# Patient Record
Sex: Female | Born: 1945
Health system: Southern US, Community
[De-identification: ages and names within clinical notes are randomized; demographics above are authoritative.]

## PROBLEM LIST (undated history)

## (undated) DIAGNOSIS — R51 Headache: Secondary | ICD-10-CM

## (undated) DIAGNOSIS — G5603 Carpal tunnel syndrome, bilateral upper limbs: Secondary | ICD-10-CM

## (undated) DIAGNOSIS — R519 Headache, unspecified: Secondary | ICD-10-CM

## (undated) DIAGNOSIS — J449 Chronic obstructive pulmonary disease, unspecified: Secondary | ICD-10-CM

## (undated) DIAGNOSIS — D649 Anemia, unspecified: Secondary | ICD-10-CM

## (undated) DIAGNOSIS — M069 Rheumatoid arthritis, unspecified: Secondary | ICD-10-CM

## (undated) DIAGNOSIS — M1990 Unspecified osteoarthritis, unspecified site: Secondary | ICD-10-CM

## (undated) DIAGNOSIS — E669 Obesity, unspecified: Secondary | ICD-10-CM

## (undated) DIAGNOSIS — I10 Essential (primary) hypertension: Secondary | ICD-10-CM

## (undated) DIAGNOSIS — E039 Hypothyroidism, unspecified: Secondary | ICD-10-CM

## (undated) DIAGNOSIS — M545 Low back pain, unspecified: Secondary | ICD-10-CM

## (undated) DIAGNOSIS — K5790 Diverticulosis of intestine, part unspecified, without perforation or abscess without bleeding: Secondary | ICD-10-CM

## (undated) DIAGNOSIS — K219 Gastro-esophageal reflux disease without esophagitis: Secondary | ICD-10-CM

## (undated) HISTORY — DX: Carpal tunnel syndrome, bilateral upper limbs: G56.03

## (undated) HISTORY — DX: Essential (primary) hypertension: I10

## (undated) HISTORY — DX: Diverticulosis of intestine, part unspecified, without perforation or abscess without bleeding: K57.90

## (undated) HISTORY — DX: Obesity, unspecified: E66.9

## (undated) HISTORY — DX: Rheumatoid arthritis, unspecified: M06.9

## (undated) HISTORY — DX: Low back pain: M54.5

## (undated) HISTORY — PX: OOPHORECTOMY: SHX86

## (undated) HISTORY — DX: Low back pain, unspecified: M54.50

## (undated) HISTORY — PX: OTHER SURGICAL HISTORY: SHX169

---

## 1975-12-11 HISTORY — PX: TUBAL LIGATION: SHX77

## 1978-12-10 HISTORY — PX: HEMORRHOID SURGERY: SHX153

## 2002-10-09 ENCOUNTER — Ambulatory Visit (HOSPITAL_COMMUNITY): Admission: RE | Admit: 2002-10-09 | Discharge: 2002-10-09 | Payer: Self-pay | Admitting: Internal Medicine

## 2002-10-09 ENCOUNTER — Encounter: Payer: Self-pay | Admitting: Internal Medicine

## 2003-04-14 ENCOUNTER — Encounter: Payer: Self-pay | Admitting: Internal Medicine

## 2003-04-14 ENCOUNTER — Ambulatory Visit (HOSPITAL_COMMUNITY): Admission: RE | Admit: 2003-04-14 | Discharge: 2003-04-14 | Payer: Self-pay | Admitting: Internal Medicine

## 2004-01-27 ENCOUNTER — Ambulatory Visit (HOSPITAL_COMMUNITY): Admission: RE | Admit: 2004-01-27 | Discharge: 2004-01-27 | Payer: Self-pay | Admitting: Internal Medicine

## 2007-10-06 ENCOUNTER — Emergency Department (HOSPITAL_COMMUNITY): Admission: EM | Admit: 2007-10-06 | Discharge: 2007-10-06 | Payer: Self-pay | Admitting: Emergency Medicine

## 2008-07-12 ENCOUNTER — Emergency Department (HOSPITAL_COMMUNITY): Admission: EM | Admit: 2008-07-12 | Discharge: 2008-07-13 | Payer: Self-pay | Admitting: Emergency Medicine

## 2008-09-07 ENCOUNTER — Encounter (INDEPENDENT_AMBULATORY_CARE_PROVIDER_SITE_OTHER): Payer: Self-pay | Admitting: Internal Medicine

## 2008-09-07 ENCOUNTER — Ambulatory Visit: Payer: Self-pay | Admitting: Internal Medicine

## 2008-09-07 ENCOUNTER — Other Ambulatory Visit: Admission: RE | Admit: 2008-09-07 | Discharge: 2008-09-07 | Payer: Self-pay | Admitting: Internal Medicine

## 2008-09-07 DIAGNOSIS — M545 Low back pain: Secondary | ICD-10-CM

## 2008-09-07 DIAGNOSIS — G5600 Carpal tunnel syndrome, unspecified upper limb: Secondary | ICD-10-CM

## 2008-09-07 DIAGNOSIS — M1990 Unspecified osteoarthritis, unspecified site: Secondary | ICD-10-CM | POA: Insufficient documentation

## 2008-09-07 DIAGNOSIS — M069 Rheumatoid arthritis, unspecified: Secondary | ICD-10-CM | POA: Insufficient documentation

## 2008-09-07 DIAGNOSIS — R319 Hematuria, unspecified: Secondary | ICD-10-CM

## 2008-09-07 LAB — CONVERTED CEMR LAB
Nitrite: NEGATIVE
Protein, U semiquant: NEGATIVE
Specific Gravity, Urine: 1.015
Urobilinogen, UA: 0.2
WBC Urine, dipstick: NEGATIVE
pH: 6

## 2008-09-09 LAB — CONVERTED CEMR LAB
ALT: 12 units/L (ref 0–35)
AST: 18 units/L (ref 0–37)
Basophils Absolute: 0 10*3/uL (ref 0.0–0.1)
CO2: 21 meq/L (ref 19–32)
CRP: 0.5 mg/dL (ref <0.6)
Calcium: 9.3 mg/dL (ref 8.4–10.5)
Chloride: 108 meq/L (ref 96–112)
Cholesterol: 220 mg/dL — ABNORMAL HIGH (ref 0–200)
Glucose, Bld: 87 mg/dL (ref 70–99)
HCT: 40.6 % (ref 36.0–46.0)
HDL: 74 mg/dL (ref >39)
Hemoglobin: 13.2 g/dL (ref 12.0–15.0)
MCHC: 32.5 g/dL (ref 30.0–36.0)
MCV: 86.6 fL (ref 78.0–100.0)
Neutro Abs: 4.2 10*3/uL (ref 1.7–7.7)
Neutrophils Relative %: 61 % (ref 43–77)
Platelets: 208 10*3/uL (ref 150–400)
Potassium: 3.9 meq/L (ref 3.5–5.3)
RDW: 13.5 % (ref 11.5–15.5)
Rhuematoid fact SerPl-aCnc: 194 intl units/mL — ABNORMAL HIGH (ref 0–20)
Sed Rate: 10 mm/hr (ref 0–22)
Sodium: 142 meq/L (ref 135–145)
Total CHOL/HDL Ratio: 3
Triglycerides: 85 mg/dL (ref <150)

## 2008-09-15 ENCOUNTER — Ambulatory Visit (HOSPITAL_COMMUNITY): Admission: RE | Admit: 2008-09-15 | Discharge: 2008-09-15 | Payer: Self-pay | Admitting: Internal Medicine

## 2008-10-05 ENCOUNTER — Ambulatory Visit: Payer: Self-pay | Admitting: Internal Medicine

## 2008-10-05 DIAGNOSIS — N952 Postmenopausal atrophic vaginitis: Secondary | ICD-10-CM | POA: Insufficient documentation

## 2008-10-08 ENCOUNTER — Encounter (INDEPENDENT_AMBULATORY_CARE_PROVIDER_SITE_OTHER): Payer: Self-pay | Admitting: Internal Medicine

## 2008-10-08 ENCOUNTER — Ambulatory Visit (HOSPITAL_COMMUNITY): Admission: RE | Admit: 2008-10-08 | Discharge: 2008-10-08 | Payer: Self-pay | Admitting: Internal Medicine

## 2008-10-19 ENCOUNTER — Encounter (INDEPENDENT_AMBULATORY_CARE_PROVIDER_SITE_OTHER): Payer: Self-pay | Admitting: Internal Medicine

## 2008-10-25 ENCOUNTER — Ambulatory Visit: Payer: Self-pay | Admitting: Internal Medicine

## 2008-11-17 ENCOUNTER — Ambulatory Visit: Payer: Self-pay | Admitting: Internal Medicine

## 2008-11-17 DIAGNOSIS — M79609 Pain in unspecified limb: Secondary | ICD-10-CM

## 2009-03-07 ENCOUNTER — Encounter (INDEPENDENT_AMBULATORY_CARE_PROVIDER_SITE_OTHER): Payer: Self-pay | Admitting: Internal Medicine

## 2009-12-10 HISTORY — PX: COLONOSCOPY: SHX174

## 2010-01-10 ENCOUNTER — Ambulatory Visit: Payer: Self-pay | Admitting: Family Medicine

## 2010-01-10 DIAGNOSIS — N200 Calculus of kidney: Secondary | ICD-10-CM | POA: Insufficient documentation

## 2010-01-10 DIAGNOSIS — R5383 Other fatigue: Secondary | ICD-10-CM

## 2010-01-10 DIAGNOSIS — M1710 Unilateral primary osteoarthritis, unspecified knee: Secondary | ICD-10-CM

## 2010-01-10 DIAGNOSIS — E559 Vitamin D deficiency, unspecified: Secondary | ICD-10-CM | POA: Insufficient documentation

## 2010-01-10 DIAGNOSIS — R05 Cough: Secondary | ICD-10-CM

## 2010-01-12 ENCOUNTER — Ambulatory Visit (HOSPITAL_COMMUNITY): Admission: RE | Admit: 2010-01-12 | Discharge: 2010-01-12 | Payer: Self-pay | Admitting: Family Medicine

## 2010-01-12 ENCOUNTER — Encounter: Payer: Self-pay | Admitting: Physician Assistant

## 2010-01-12 ENCOUNTER — Telehealth: Payer: Self-pay | Admitting: Family Medicine

## 2010-01-13 LAB — CONVERTED CEMR LAB
BUN: 18 mg/dL (ref 6–23)
CO2: 22 meq/L (ref 19–32)
Eosinophils Absolute: 0.1 10*3/uL (ref 0.0–0.7)
Eosinophils Relative: 2 % (ref 0–5)
Glucose, Bld: 91 mg/dL (ref 70–99)
HCT: 40 % (ref 36.0–46.0)
Hemoglobin: 12.6 g/dL (ref 12.0–15.0)
LDL Cholesterol: 131 mg/dL — ABNORMAL HIGH (ref 0–99)
MCHC: 31.5 g/dL (ref 30.0–36.0)
Monocytes Absolute: 0.3 10*3/uL (ref 0.1–1.0)
Neutro Abs: 3.4 10*3/uL (ref 1.7–7.7)
Neutrophils Relative %: 60 % (ref 43–77)
Platelets: 207 10*3/uL (ref 150–400)
Potassium: 4 meq/L (ref 3.5–5.3)
RDW: 13.6 % (ref 11.5–15.5)
Total CHOL/HDL Ratio: 3.2
Triglycerides: 62 mg/dL (ref <150)
Vit D, 25-Hydroxy: 29 ng/mL — ABNORMAL LOW (ref 30–89)

## 2010-01-16 ENCOUNTER — Ambulatory Visit (HOSPITAL_COMMUNITY): Admission: RE | Admit: 2010-01-16 | Discharge: 2010-01-16 | Payer: Self-pay | Admitting: Family Medicine

## 2010-01-20 ENCOUNTER — Ambulatory Visit (HOSPITAL_COMMUNITY): Admission: RE | Admit: 2010-01-20 | Discharge: 2010-01-20 | Payer: Self-pay | Admitting: Family Medicine

## 2010-01-21 ENCOUNTER — Encounter: Payer: Self-pay | Admitting: Family Medicine

## 2010-01-21 DIAGNOSIS — E669 Obesity, unspecified: Secondary | ICD-10-CM

## 2010-01-21 DIAGNOSIS — J40 Bronchitis, not specified as acute or chronic: Secondary | ICD-10-CM | POA: Insufficient documentation

## 2010-01-21 DIAGNOSIS — F17200 Nicotine dependence, unspecified, uncomplicated: Secondary | ICD-10-CM

## 2010-01-25 ENCOUNTER — Encounter: Payer: Self-pay | Admitting: Family Medicine

## 2010-02-07 HISTORY — PX: OTHER SURGICAL HISTORY: SHX169

## 2010-02-09 ENCOUNTER — Encounter: Payer: Self-pay | Admitting: Family Medicine

## 2010-02-20 ENCOUNTER — Ambulatory Visit (HOSPITAL_COMMUNITY): Admission: RE | Admit: 2010-02-20 | Discharge: 2010-02-20 | Payer: Self-pay | Admitting: Obstetrics and Gynecology

## 2010-02-24 ENCOUNTER — Other Ambulatory Visit: Admission: RE | Admit: 2010-02-24 | Discharge: 2010-02-24 | Payer: Self-pay | Admitting: Obstetrics and Gynecology

## 2010-03-07 ENCOUNTER — Ambulatory Visit (HOSPITAL_COMMUNITY): Admission: RE | Admit: 2010-03-07 | Discharge: 2010-03-07 | Payer: Self-pay | Admitting: Obstetrics and Gynecology

## 2010-04-04 ENCOUNTER — Telehealth: Payer: Self-pay | Admitting: Family Medicine

## 2010-04-07 ENCOUNTER — Encounter: Payer: Self-pay | Admitting: Family Medicine

## 2010-05-01 ENCOUNTER — Encounter (HOSPITAL_COMMUNITY): Admission: RE | Admit: 2010-05-01 | Discharge: 2010-05-31 | Payer: Self-pay | Admitting: Unknown Physician Specialty

## 2010-05-03 ENCOUNTER — Ambulatory Visit: Payer: Self-pay | Admitting: Family Medicine

## 2010-05-03 DIAGNOSIS — E785 Hyperlipidemia, unspecified: Secondary | ICD-10-CM

## 2010-05-03 DIAGNOSIS — M25519 Pain in unspecified shoulder: Secondary | ICD-10-CM | POA: Insufficient documentation

## 2010-05-18 ENCOUNTER — Encounter: Payer: Self-pay | Admitting: Family Medicine

## 2010-05-24 ENCOUNTER — Encounter: Payer: Self-pay | Admitting: Gastroenterology

## 2010-05-26 ENCOUNTER — Telehealth (INDEPENDENT_AMBULATORY_CARE_PROVIDER_SITE_OTHER): Payer: Self-pay

## 2010-05-26 ENCOUNTER — Encounter (INDEPENDENT_AMBULATORY_CARE_PROVIDER_SITE_OTHER): Payer: Self-pay | Admitting: *Deleted

## 2010-05-26 ENCOUNTER — Encounter: Payer: Self-pay | Admitting: Gastroenterology

## 2010-05-30 ENCOUNTER — Ambulatory Visit (HOSPITAL_COMMUNITY): Admission: RE | Admit: 2010-05-30 | Discharge: 2010-05-30 | Payer: Self-pay | Admitting: Gastroenterology

## 2010-05-30 ENCOUNTER — Ambulatory Visit: Payer: Self-pay | Admitting: Gastroenterology

## 2010-12-31 ENCOUNTER — Encounter: Payer: Self-pay | Admitting: Family Medicine

## 2011-01-10 ENCOUNTER — Telehealth: Payer: Self-pay | Admitting: Family Medicine

## 2011-01-11 NOTE — Progress Notes (Signed)
Summary: [facility]  [facility]   Imported By: Lind Guest 02/14/2010 08:13:33  _____________________________________________________________________  External Attachment:    Type:   Image     Comment:   External Document

## 2011-01-11 NOTE — Letter (Signed)
Summary: Internal Other Domingo Dimes  Internal Other Domingo Dimes   Imported By: Cloria Spring LPN 16/09/9603 54:09:81  _____________________________________________________________________  External Attachment:    Type:   Image     Comment:   External Document  Appended Document: Internal Other Domingo Dimes trilyte.  Appended Document: Internal Other /triage Pt informed and Rx and instructions faxed to [facility].

## 2011-01-11 NOTE — Progress Notes (Signed)
Summary: reviewed prep instructions with pt  Phone Note Outgoing Call   Call placed by: Anaya Bovee Call placed to: Patient Summary of Call: Reviewed instructions for prep with pt. Initial call taken by: Cloria Spring LPN,  May 26, 2010 2:54 PM

## 2011-01-11 NOTE — Progress Notes (Signed)
Summary: ref. number for no precert  Phone Note Call from Patient   Summary of Call: insurance company stated pt didn't need a precert on ct scan. Ref [id number] Initial call taken by: Rudene Anda,  January 12, 2010 1:57 PM

## 2011-01-11 NOTE — Letter (Signed)
Summary: Internal Other  Internal Other   Imported By: Peggyann Shoals 05/24/2010 15:54:23  _____________________________________________________________________  External Attachment:    Type:   Image     Comment:   External Document  Appended Document: Internal Other TRILY[facility]. PEDS SCOPE. CONTINUE ASA.

## 2011-01-11 NOTE — Procedures (Signed)
Summary: NO SHOW  NO SHOW   Imported By: Lind Guest 05/18/2010 13:38:39  _____________________________________________________________________  External Attachment:    Type:   Image     Comment:   External Document

## 2011-01-11 NOTE — Letter (Signed)
Summary: Internal Other/rx instrctions for prep  Internal Other/rx instrctions for prep   Imported By: Cloria Spring LPN 16/09/9603 54:09:81  _____________________________________________________________________  External Attachment:    Type:   Image     Comment:   External Document

## 2011-01-11 NOTE — Miscellaneous (Signed)
  Clinical Lists Changes  Problems: Assessed ABDOMINAL MASS, RIGHT UPPER QUADRANT as comment only - pt referred for abd and pelvic cT scan with contrast to further eval Observations: Added new observation of PEADULT: Syliva Overman MD ~Abdomen`Abdomen exam (01/21/2010 10:39) Added new observation of ABDOMEN EXAM: Bowel sounds positive,abdomen soft and tender with probable RUQ mass and lower abd mass (01/21/2010 10:39)        Impression & Recommendations:  Problem # 1:  ABDOMINAL MASS, RIGHT UPPER QUADRANT (ICD-789.31) Assessment Comment Only pt referred for abd and pelvic cT scan with contrast to further eval  Complete Medication List: 1)  Tylenol 325 Mg Tabs (Acetaminophen) .... As needed 2)  Penicillin V Potassium 500 Mg Tabs (Penicillin v potassium) .... Take 1 tablet by mouth three times a day 3)  Tessalon Perles 100 Mg Caps (Benzonatate) .... Take 1 capsule by mouth three times a day   Physical Exam  Abdomen:  Bowel sounds positive,abdomen soft and tender with probable RUQ mass and lower abd mass this is a correction from original note

## 2011-01-11 NOTE — Progress Notes (Signed)
Summary: speak with nurse  Phone Note Call from Patient   Summary of Call: would like to speak to nurse about sending a test over to other doc for her. [phone] Initial call taken by: Rudene Anda,  April 04, 2010 2:42 PM  Follow-up for Phone Call        patient requested bone density be faxed to ortho in [place]  faxed per patient request Follow-up by: Adella Hare LPN,  April 04, 2010 3:18 PM

## 2011-01-11 NOTE — Assessment & Plan Note (Signed)
Summary: phy   Vital Signs:  Patient profile:   65 year old female Menstrual status:  postmenopausal Height:      60 inches Weight:      152.25 pounds BMI:     29.84 O2 Sat:      94 % Pulse rate:   84 / minute Pulse rhythm:   regular Resp:     16 per minute BP sitting:   120 / 74  (left arm)  Vitals Entered By: Everitt Amber LPN (May 03, 2010 1:28 PM)  Nutrition Counseling: Patient's BMI is greater than 25 and therefore counseled on weight management options. CC: CPE  Vision Screening:Left eye w/o correction: 20 / 70 Right Eye w/o correction: 20 / 50 Both eyes w/o correction:  20/ 40  Color vision testing: normal      Vision Entered By: Everitt Amber LPN (May 03, 2010 1:32 PM)   CC:  CPE.  History of Present Illness: Reports  thatshe has been doing well except for her left shoulder. She had pelvic surgery for benign ovarian ds and recovered well. She still smokes , and has no quit date set. She isagreeing to get a screening colonscopy.  Denies recent fever or chills. Denies sinus pressure, nasal congestion , ear pain or sore throat. Denies chest congestion, or cough productive of sputum. Denies chest pain, palpitations, PND, orthopnea or leg swelling. Denies abdominal pain, nausea, vomitting, diarrhea or constipation. Denies change in bowel movements or bloody stool. Denies dysuria , frequency, incontinence or hesitancy.  Denies headaches, vertigo, seizures. Denies depression, anxiety or insomnia. Denies  rash, lesions, or itch.     Current Medications (verified): 1)  Tylenol 325 Mg Tabs (Acetaminophen) .... As Needed 2)  Oscal 500/200 D-3 500-200 Mg-Unit Tabs (Calcium-Vitamin D) .... One Tab By Mouth Three Times A Day 3)  Multivitamins  Tabs (Multiple Vitamin) .... Take 1 Tablet By Mouth Once A Day  Allergies (verified): No Known Drug Allergies  Past History:  Past Surgical History: Hemorrhoidectomy-1980 Tubal ligation-1977 Bilateral foot  surgery tonsillectomy and adenoidectomny in childhood  Roophorectomy for benign disease [facility] March 2011  Review of Systems      See HPI Eyes:  Denies blurring and discharge. Resp:  Complains of cough and shortness of breath; denies sputum productive and wheezing; chronic smoker's cough. MS:  Complains of joint pain and stiffness; left shoulder pain and stiffness x 1 week, no aggravating factor  noted, first episode. Endo:  Denies cold intolerance, excessive thirst, excessive urination, and heat intolerance. Heme:  Denies abnormal bruising and bleeding. Allergy:  Denies hives or rash and itching eyes.  Physical Exam  General:  Well-developed,well-nourished,in no acute distress; alert,appropriate and cooperative throughout examination HEENT: No facial asymmetry,  EOMI, No sinus tenderness, TM's Clear, oropharynx  pink and moist.   Chest: decreased air entry with basilar crackles on right lung CVS: S1, S2, No murmurs, No S3.   Abd: Soft, Nontender.  MS: Adequate ROM spine and hips,  reduced in knees, with crepitus. Reducede in left shoulder. Ext: No edema.   CNS: CN 2-12 intact, power tone and sensation normal throughout.   Skin: Intact, no visible lesions or rashes.  Psych: Good eye contact, normal affect.  Memory intact, not anxious or depressed appearing.    Impression & Recommendations:  Problem # 1:  SHOULDER PAIN, LEFT (ICD-719.41) Assessment Deteriorated  Her updated medication list for this problem includes:    Tylenol 325 Mg Tabs (Acetaminophen) .Marland Kitchen... As needed  Ibuprofen 800 Mg Tabs (Ibuprofen) .Marland Kitchen... Take 1 tablet by mouth three times a day , start 05/04/2010    Aspirin 81 Mg Chew (Aspirin) .Marland Kitchen... Take 1 tablet by mouth once a day  Orders: Depo- Medrol 80mg  (J1040) Ketorolac-Toradol 15mg  (E9528) Admin of Therapeutic Inj  intramuscular or subcutaneous (41324)  Problem # 2:  HYPERLIPIDEMIA (ICD-272.4) Assessment: Comment Only  Labs Reviewed: SGOT: 18  (09/07/2008)   SGPT: 12 (09/07/2008)   HDL:65 (01/12/2010), 74 (09/07/2008)  LDL:131 (01/12/2010), 129 (09/07/2008)  Chol:208 (01/12/2010), 220 (09/07/2008)  Trig:62 (01/12/2010), 85 (09/07/2008), low fat diet discussed, will hold on prescription meds at this time  Problem # 3:  NICOTINE ADDICTION (ICD-305.1)  Encouraged smoking cessation and discussed different methods for smoking cessation.   Problem # 4:  OBESITY, UNSPECIFIED (ICD-278.00) Assessment: Improved  Ht: 60 (05/03/2010)   Wt: 152.25 (05/03/2010)   BMI: 29.84 (05/03/2010)  Complete Medication List: 1)  Tylenol 325 Mg Tabs (Acetaminophen) .... As needed 2)  Oscal 500/200 D-3 500-200 Mg-unit Tabs (Calcium-vitamin d) .... One tab by mouth three times a day 3)  Multivitamins Tabs (Multiple vitamin) .... Take 1 tablet by mouth once a day 4)  Ibuprofen 800 Mg Tabs (Ibuprofen) .... Take 1 tablet by mouth three times a day , start 05/04/2010 5)  Prednisone (pak) 5 Mg Tabs (Prednisone) .... Use as directed 6)  Claritin 10 Mg Tabs (Loratadine) .... Take 1 tablet by mouth once a day 7)  Aspirin 81 Mg Chew (Aspirin) .... Take 1 tablet by mouth once a day  Other Orders: Gastroenterology Referral (GI)  Patient Instructions: 1)  F/u in 4.5 months. 2)  You will receive injections in the office for the left shoulder and meds are  also sent in to your pharmacy. Pls call if the pain and stiffness persist in the left shoulder for referral to orthoipedics. 3)  You WILL BE REFERRED TO gi FOR A SCREENNG COLONSCOPY, THAT OFFICE WILL CALL YOU WITH THE APPT. 4)  PLS FOLLOW A LOW FAT DIET , WHITE MEATS AND ALOT OF FRUIT AND VEG, YOUR CHOLESTEROL WAS SLIGHTLY HIGH. 5)  It is important that you exercise regularly at least 20 minutes 5 times a week. If you develop chest pain, have severe difficulty breathing, or feel very tired , stop exercising immediately and seek medical attention. 6)  You need to lose weight. Consider a lower calorie diet and regular  exercise.  7)  PLS sched your eye exam Prescriptions: ASPIRIN 81 MG CHEW (ASPIRIN) Take 1 tablet by mouth once a day  #100 x 3   Entered and Authorized by:   Syliva Overman MD   Signed by:   Syliva Overman MD on 05/03/2010   Method used:   Electronically to        [facility] (retail)       [address]       [place], [place]  [address]       Ph: [phone]       Fax: [phone]   RxID:   [id number] CLARITIN 10 MG TABS (LORATADINE) Take 1 tablet by mouth once a day  #30 x 3   Entered and Authorized by:   Syliva Overman MD   Signed by:   Syliva Overman MD on 05/03/2010   Method used:   Electronically to        [facility] (retail)       [address]  [place], [place]  [address]       Ph: [phone]       Fax: [phone]   RxID:   [id number] PREDNISONE (PAK) 5 MG TABS (PREDNISONE) Use as directed  #21 x 0   Entered and Authorized by:   Syliva Overman MD   Signed by:   Syliva Overman MD on 05/03/2010   Method used:   Electronically to        [facility] (retail)       [address] [place], [place]  [address]       Ph: [phone]       Fax: [phone]   RxID:   [id number] IBUPROFEN 800 MG TABS (IBUPROFEN) Take 1 tablet by mouth three times a day , start 05/04/2010  #30 x 0   Entered and Authorized by:   Syliva Overman MD   Signed by:   Syliva Overman MD on 05/03/2010   Method used:   Electronically to        [facility] (retail)       [address] [place], [place]  [address]       Ph: [phone]       Fax: [phone]   RxID:   [id number]       Medication Administration  Injection # 1:    Medication: Depo- Medrol 80mg     Diagnosis: SHOULDER PAIN, LEFT (ICD-719.41)    Route: IM    Site: RUOQ gluteus    Exp Date: 3/12    Lot #: [id number]    Mfr: Pharmacia     Patient tolerated injection without complications    Given by: Adella Hare LPN (May 03, 2010 4:53 PM)  Injection # 2:    Medication: Ketorolac-Toradol 15mg     Diagnosis: SHOULDER PAIN, LEFT (ICD-719.41)    Route: IM    Site: LUOQ gluteus    Exp Date: 11/10/2011    Lot #: [id number]    Mfr: hospira    Comments: toradol 60mg  given    Patient tolerated injection without complications    Given by: Adella Hare LPN (May 03, 2010 4:54 PM)  Orders Added: 1)  Est. Patient Level IV [46270] 2)  Depo- Medrol 80mg  [J1040] 3)  Ketorolac-Toradol 15mg  [J1885] 4)  Admin of Therapeutic Inj  intramuscular or subcutaneous [96372] 5)  Gastroenterology Referral [GI]

## 2011-01-11 NOTE — Assessment & Plan Note (Signed)
Summary: new pt   Vital Signs:  Patient profile:   65 year old female Menstrual status:  postmenopausal Height:      60 inches Weight:      156.75 pounds BMI:     30.72 O2 Sat:      97 % Pulse rate:   69 / minute Pulse rhythm:   regular Resp:     16 per minute BP sitting:   120 / 72 Cuff size:   large  Vitals Entered By: Everitt Amber 23-Jan-2010 1:49 PM)  Nutrition Counseling: Patient's BMI is greater than 25 and therefore counseled on weight management options. CC: New Patient, having trouble with legs, after she sits awhile and stands it takes her a few mins to get her balance, feels unstable     Menstrual Status postmenopausal Last PAP Result NEGATIVE FOR INTRAEPITHELIAL LESIONS OR MALIGNANCY.   CC:  New Patient, having trouble with legs, after she sits awhile and stands it takes her a few mins to get her balance, and feels unstable.  History of Present Illness: This is anew pt eval , for this pt who is essentially in fairly good health. She does have concerns about progressive pain and instability with limited mobility affecting the knees. She also has a chronic cough with sputum production, but she denies any recentr fever, chills or myalsgias. She is a current smoker. Reports  that they are doing well.  Denies sinus pressure, nasal congestion , ear pain or sore throat.She has noted increased pND in the past 2 weeks. . Denies chest pain, palpitations, PND, orthopnea or leg swelling. Denies abdominal pain, nausea, vomitting, diarrhea or constipation. Denies change in bowel movements or bloody stool. Denies dysuria , frequency, incontinence or hesitancy.  Denies headaches, vertigo, seizures. Denies depression, anxiety or insomnia. Denies  rash, lesions, or itch.     Preventive Screening-Counseling & Management  Alcohol-Tobacco     Smoking Status: current     Smoking Cessation Counseling: yes     Packs/Day: 0.5  Comments: had quit but started back    Current Medications (verified): 1)  Tylenol 325 Mg Tabs (Acetaminophen) .... As Needed  Allergies (verified): No Known Drug Allergies  Past History:  Family History: Last updated: 01-23-2010 father-deceased-88-heart trouble mother-deceased-84-heart trouble sister-78- sister-76-heart problem, DM, HTN  deceased in 05-03-10 at age 56 sister-69 sister-66 sister-64 daughter-43-HTN, thyroid daughter-34 daughter-32  4 brothers all deceased one at 3months, one shot at age 67, one in his 22' leukemia. the last age 5 MI   Social History: Last updated: 01-23-10 Widow/Widower-lives with granddaughter  since 03-May-2005 Alcohol use-no Drug use-no Disabled for arthritis since age 66 works weekends as dietary aide at [employer] Current Smoker  0.5 PPD  Risk Factors: Smoking Status: current (01-23-2010) Packs/Day: 0.5 (01-23-2010)  Past Medical History: Low back pain carpal tunnel syndrome bilateral obesity Rheumatoid arthritis  Past Surgical History: Hemorrhoidectomy-1980 Tubal ligation-1977 Bilateral foot surgery tonsillectomy and adenoidectomny in childhood   Family History: father-deceased-88-heart trouble mother-deceased-84-heart trouble sister-78- sister-76-heart problem, DM, HTN  deceased in May 03, 2010 at age 57 sister-69 sister-66 sister-64 daughter-43-HTN, thyroid daughter-34 daughter-32  4 brothers all deceased one at 3months, one shot at age 40, one in his 43' leukemia. 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 ENT:  Complains of postnasal drainage and sinus pressure; increased. Resp:  Complains of cough, shortness of breath, and sputum productive; chronic , scant yellowish sputum. GI:   Complains of abdominal pain; feels a swelling like a knot when she  is on commode for 6 months . MS:  Complains of joint pain and stiffness; progressive knee pain and stiffness with reduced mobility and unsteady gait. Endo:  Denies cold intolerance, excessive hunger, excessive thirst, excessive urination, heat intolerance, polyuria, and weight change. Heme:  Denies abnormal bruising and bleeding. Allergy:  Complains of seasonal allergies; denies hives or rash and sneezing; mild.  Physical Exam  General:  Well-developed,well-nourished,in no acute distress; alert,appropriate and cooperative throughout examination HEENT: No facial asymmetry,  EOMI, No sinus tenderness, TM's Clear, oropharynx  pink and moist.   Chest: decreased air entry with basilar crackles on right lung CVS: S1, S2, No murmurs, No S3.   Abd: Soft, Nontender.  MS: Adequate ROM spine, hips, shoulders and  reduced in knees, with crepitus.  Ext: No edema.   CNS: CN 2-12 intact, power tone and sensation normal throughout.   Skin: Intact, no visible lesions or rashes.  Psych: Good eye contact, normal affect.  Memory intact, not anxious or depressed appearing.    Impression & Recommendations:  Problem # 1:  OSTEOARTHRITIS, KNEES, BILATERAL (ICD-715.96) Assessment Deteriorated  The following medications were removed from the medication list:    Diclofenac Sodium 75 Mg Tbec (Diclofenac sodium) .Marland Kitchen... 1 by mouth two times a day    Ultracet 37.5-325 Mg Tabs (Tramadol-acetaminophen) .Marland Kitchen... 1 by mouth q 6 hours as needed pain Her updated medication list for this problem includes:    Tylenol 325 Mg Tabs (Acetaminophen) .Marland Kitchen... As needed  Orders: Orthopedic Referral (Ortho)  Problem # 2:  BRONCHITIS NOT SPECIFIED AS ACUTE OR CHRONIC (ICD-490) Assessment: Comment Only penicillin prescribed  Problem # 3:  UNSPECIFIED BREAST SCREENING (ICD-V76.10) Assessment: Comment Only referred for mamo which is past due  Problem # 4:   SPECIAL SCREENING FOR OSTEOPOROSIS (ICD-V82.81) Assessment: Comment Only  Future Orders: Radiology Referral (Radiology) ... 01/11/2010  Problem # 5:  OBESITY, UNSPECIFIED (ICD-278.00) Assessment: Improved  Ht: 60 (01/10/2010)   Wt: 156.75 (01/10/2010)   BMI: 30.72 (01/10/2010)  Problem # 6:  NICOTINE ADDICTION (ICD-305.1) Assessment: Unchanged  Encouraged smoking cessation and discussed different methods for smoking cessation.   Complete Medication List: 1)  Tylenol 325 Mg Tabs (Acetaminophen) .... As needed 2)  Penicillin V Potassium 500 Mg Tabs (Penicillin v potassium) .... Take 1 tablet by mouth three times a day 3)  Tessalon Perles 100 Mg Caps (Benzonatate) .... Take 1 capsule by mouth three times a day  Other Orders: CXR- 2view (CXR) T-Basic Metabolic Panel (81191-47829) T-Lipid Profile (56213-08657) T-CBC w/Diff (84696-29528) T-Vitamin D (25-Hydroxy) (41324-40102)  Patient Instructions: 1)  CPE   in 6 to 8 weeks 2)  Tobacco is very bad for your health and your loved ones! You Should stop smoking!. 3)  Stop Smoking Tips: Choose a Quit date. Cut down before the Quit date. decide what you will do as a substitute when you feel the urge to smoke(gum,toothpick,exercise). 4)  You need to lose weight. Consider a lower calorie diet and regular exercise.  5)  BMP prior to visit, ICD-9: 6)  Lipid Panel prior to visit, ICD-9: 7)  TSH prior to visit, ICD-9: 8)  CBC w/ Diff prior to visit, ICD-9: 9)  Vit D level 10)  Meds  are sent in for bronchitis, you are being referred for several screening tests as discussed Prescriptions: TESSALON PERLES 100 MG CAPS (BENZONATATE) Take 1 capsule by mouth three times a day  #30 x 0   Entered and Authorized by:   Syliva Overman MD   Signed by:   Syliva Overman MD on 01/10/2010   Method used:   Electronically to        [facility] (retail)       [address]       [place], [place]  [address]       Ph:  [phone]       Fax: [phone]   RxID:   [id number] PENICILLIN V POTASSIUM 500 MG TABS (PENICILLIN V POTASSIUM) Take 1 tablet by mouth three times a day  #30 x 0   Entered and Authorized by:   Syliva Overman MD   Signed by:   Syliva Overman MD on 01/10/2010   Method used:   Electronically to        [facility] (retail)       [address]       [place], [place]  [address]       Ph: [phone]       Fax: [phone]   RxID:   [id number]

## 2011-01-11 NOTE — Letter (Signed)
Summary: Letter  Letter   Imported By: Lind Guest 01/26/2010 13:10:48  _____________________________________________________________________  External Attachment:    Type:   Image     Comment:   External Document

## 2011-01-12 ENCOUNTER — Telehealth: Payer: Self-pay | Admitting: Family Medicine

## 2011-01-17 NOTE — Progress Notes (Signed)
Summary: rash  Phone Note Call from Patient   Summary of Call: pt has a rash on back and arms. pt would like to know what she can do [phone] Initial call taken by: Rudene Anda,  January 10, 2011 1:24 PM  Follow-up for Phone Call        i suggest trial of otc hydrocortisone sparongly twice daily for the next 5 days, ensure no pus draining from the rash pls,  Follow-up by: Syliva Overman MD,  January 10, 2011 2:26 PM  Additional Follow-up for Phone Call Additional follow up Details #1::        ADVISED HYDROCORTISONE CREAM  NO PUS BUT ITCHING TERRIBLY,  STATES SHE HAS BEEN TAKING BENADRYL  ADVISED TO TRY HYDROCORTISONE CREAM AND IF NO BETTER CALL FOR OV OR NV  Additional Follow-up by: Adella Hare LPN,  January 10, 2011 4:20 PM

## 2011-01-17 NOTE — Progress Notes (Signed)
Summary: rash  Phone Note Call from Patient   Summary of Call: patient called in and states her rash hasn't gotten any better.  I advised after talking to Asher Muir that she would need to go to Urgent Care. Initial call taken by: Curtis Sites,  January 12, 2011 10:22 AM  Follow-up for Phone Call        noted and agree, no openings today Follow-up by: Adella Hare LPN,  January 12, 2011 10:37 AM

## 2011-01-30 ENCOUNTER — Encounter: Payer: Self-pay | Admitting: Family Medicine

## 2011-01-30 ENCOUNTER — Ambulatory Visit (INDEPENDENT_AMBULATORY_CARE_PROVIDER_SITE_OTHER): Payer: Medicare HMO | Admitting: Family Medicine

## 2011-01-30 DIAGNOSIS — M25561 Pain in right knee: Secondary | ICD-10-CM

## 2011-01-30 DIAGNOSIS — F3289 Other specified depressive episodes: Secondary | ICD-10-CM

## 2011-01-30 DIAGNOSIS — F329 Major depressive disorder, single episode, unspecified: Secondary | ICD-10-CM | POA: Insufficient documentation

## 2011-01-30 DIAGNOSIS — M25569 Pain in unspecified knee: Secondary | ICD-10-CM

## 2011-01-30 DIAGNOSIS — M1990 Unspecified osteoarthritis, unspecified site: Secondary | ICD-10-CM

## 2011-01-30 HISTORY — DX: Pain in right knee: M25.561

## 2011-01-31 ENCOUNTER — Encounter: Payer: Self-pay | Admitting: Family Medicine

## 2011-02-01 ENCOUNTER — Encounter: Payer: Self-pay | Admitting: Family Medicine

## 2011-02-02 ENCOUNTER — Encounter: Payer: Self-pay | Admitting: Family Medicine

## 2011-02-02 DIAGNOSIS — E049 Nontoxic goiter, unspecified: Secondary | ICD-10-CM | POA: Insufficient documentation

## 2011-02-02 DIAGNOSIS — E039 Hypothyroidism, unspecified: Secondary | ICD-10-CM | POA: Insufficient documentation

## 2011-02-02 LAB — CONVERTED CEMR LAB
Basophils Relative: 0 % (ref 0–1)
CO2: 24 meq/L (ref 19–32)
Chloride: 107 meq/L (ref 96–112)
Creatinine, Ser: 0.68 mg/dL (ref 0.40–1.20)
Eosinophils Absolute: 0 10*3/uL (ref 0.0–0.7)
Eosinophils Relative: 0 % (ref 0–5)
HCT: 40.9 % (ref 36.0–46.0)
LDL Cholesterol: 126 mg/dL — ABNORMAL HIGH (ref 0–99)
MCHC: 31.8 g/dL (ref 30.0–36.0)
MCV: 88.5 fL (ref 78.0–100.0)
Platelets: 236 10*3/uL (ref 150–400)
Potassium: 4.6 meq/L (ref 3.5–5.3)
RDW: 15.3 % (ref 11.5–15.5)
Sodium: 144 meq/L (ref 135–145)
TSH: 6.323 microintl units/mL — ABNORMAL HIGH (ref 0.350–4.500)
Total CHOL/HDL Ratio: 2.9
VLDL: 15 mg/dL (ref 0–40)
WBC: 8.2 10*3/uL (ref 4.0–10.5)

## 2011-02-03 ENCOUNTER — Encounter: Payer: Self-pay | Admitting: Family Medicine

## 2011-02-05 ENCOUNTER — Other Ambulatory Visit: Payer: Self-pay | Admitting: Family Medicine

## 2011-02-05 DIAGNOSIS — E049 Nontoxic goiter, unspecified: Secondary | ICD-10-CM

## 2011-02-06 ENCOUNTER — Ambulatory Visit: Payer: Medicare HMO

## 2011-02-06 ENCOUNTER — Encounter: Payer: Self-pay | Admitting: Family Medicine

## 2011-02-06 LAB — CONVERTED CEMR LAB: Free T4: 0.68 ng/dL — ABNORMAL LOW (ref 0.80–1.80)

## 2011-02-06 NOTE — Letter (Signed)
Summary: medical release  medical release   Imported By: Lind Guest 01/30/2011 16:35:17  _____________________________________________________________________  External Attachment:    Type:   Image     Comment:   External Document

## 2011-02-06 NOTE — Letter (Signed)
Summary: lab add on  lab add on   Imported By: Luann Bullins 02/02/2011 14:12:42  _____________________________________________________________________  External Attachment:    Type:   Image     Comment:   External Document

## 2011-02-06 NOTE — Assessment & Plan Note (Signed)
Summary: office visit   Vital Signs:  Patient profile:   65 year old female Menstrual status:  postmenopausal Height:      60 inches Weight:      154.75 pounds BMI:     30.33 O2 Sat:      97 % Pulse rate:   80 / minute Pulse rhythm:   regular Resp:     16 per minute BP sitting:   142 / 84  (left arm) Cuff size:   large  Vitals Entered By: Everitt Amber LPN (January 30, 2011 8:39 AM)  Nutrition Counseling: Patient's BMI is greater than 25 and therefore counseled on weight management options. CC: c/o of ankles, legs and arms hurting from arthritis. Wants something for pain    CC:  c/o of ankles and legs and arms hurting from arthritis. Wants something for pain .  History of Present Illness: Reports  that she is not doing well.She has uncontrolled generalised joint pains, her left knee often feels as though it will give out, and since being layed off, she is increasingly depressed. Denies recent fever or chills. Denies sinus pressure, nasal congestion , ear pain or sore throat. Denies chest congestion, or cough productive of sputum. Denies chest pain, palpitations, PND, orthopnea or leg swelling. Denies abdominal pain, nausea, vomitting, diarrhea or constipation. Denies change in bowel movements or bloody stool. Denies dysuria , frequency, incontinence or hesitancy.  Denies headaches, vertigo, seizures.  Denies  rash, lesions, or itch.     Current Medications (verified): 1)  Tylenol 325 Mg Tabs (Acetaminophen) .... As Needed 2)  Oscal 500/200 D-3 500-200 Mg-Unit Tabs (Calcium-Vitamin D) .... One Tab By Mouth Three Times A Day 3)  Multivitamins  Tabs (Multiple Vitamin) .... Take 1 Tablet By Mouth Once A Day 4)  Ibuprofen 800 Mg Tabs (Ibuprofen) .... Take 1 Tablet By Mouth Three Times A Day , Start 05/04/2010 5)  Claritin 10 Mg Tabs (Loratadine) .... Take 1 Tablet By Mouth Once A Day 6)  Aspirin 81 Mg Chew (Aspirin) .... Take 1 Tablet By Mouth Once A Day  Allergies  (verified): No Known Drug Allergies  Past History:  Past medical, surgical, family and social histories (including risk factors) reviewed, and no changes noted (except as noted below).  Past Medical History: Low back pain carpal tunnel syndrome bilateral obesity Rheumatoid arthritis diverticulosis dx in May 17, 2010 from colonscopy  Past Surgical History: Hemorrhoidectomy-1980 Tubal ligation-1977 Bilateral foot surgery tonsillectomy and adenoidectomny in childhood  R oophorectomy for benign disease [facility] March 2011 hernorraphy May 17, 2010  Family History: Reviewed history from 01/10/2010 and no changes required. father-deceased-88-heart trouble mother-deceased-84-heart trouble sister-78- sister-76-heart problem, DM, HTN  deceased in 17-May-2010 at age 3 sister-69 sister-66 sister-64 daughter-43-HTN, thyroid daughter-34 daughter-32  4 brothers all deceased one at 3months, one shot at age 68, one in his 19' leukemia. the last age 22 MI   Social History: Reviewed history from 01/10/2010 and no changes required. Widow/Widower-lives with granddaughter  since 05-17-2005 Alcohol use-no Drug use-no Disabled for arthritis since age 43 unemploye since 2011e Current Smoker  0.5 PPD  Review of Systems      See HPI General:  Complains of fatigue and sleep disorder. Eyes:  Denies discharge and eye pain. MS:  Complains of joint pain, low back pain, mid back pain, and stiffness. Psych:  Complains of depression, easily tearful, and mental problems; denies suicidal thoughts/plans, thoughts of violence, and unusual visions or sounds; overwhelmed, depressed and tearful since being out of work, feels  her life is meaningless. Endo:  Denies cold intolerance, excessive hunger, excessive thirst, excessive urination, and heat intolerance. Heme:  Denies abnormal bruising and bleeding. Allergy:  Complains of seasonal allergies; denies hives or rash and itching eyes.  Physical Exam  General:   Well-developed,obese,in no acute distress; alert,appropriate and cooperative throughout examination. Tearful HEENT: No facial asymmetry, goiter EOMI, No sinus tenderness, TM's Clear, oropharynx  pink and moist.   Chest: clear CVS: S1, S2, No murmurs, No S3.   Abd: Soft, Nontender.  MS: Adequate ROM spine and hips,  reduced in knees, with crepitus.Left greater than right. Reducede in left shoulder. Ext: No edema.   CNS: CN 2-12 intact, power tone and sensation normal throughout.   Skin: Intact, no visible lesions or rashes.  Psych: Poor eye contact, flat affect.  Memory intact, depressed appearing.    Impression & Recommendations:  Problem # 1:  DEPRESSION (ICD-311) Assessment Comment Only  Her updated medication list for this problem includes:    Citalopram Hydrobromide 10 Mg Tabs (Citalopram hydrobromide) .Marland Kitchen... Take 1 tablet by mouth once a day  Orders: Psychology Referral (Psychology) Medicare Electronic Prescription [phone])  Problem # 2:  KNEE PAIN, LEFT (ICD-719.46) Assessment: Deteriorated  Her updated medication list for this problem includes:    Tylenol 325 Mg Tabs (Acetaminophen) .Marland Kitchen... As needed    Ibuprofen 800 Mg Tabs (Ibuprofen) .Marland Kitchen... Take 1 tablet by mouth three times a day , start 05/04/2010    Aspirin 81 Mg Chew (Aspirin) .Marland Kitchen... Take 1 tablet by mouth once a day    Tramadol Hcl 50 Mg Tabs (Tramadol hcl) ..... One tablet at bedtime for left knee pain  Orders: Depo- Medrol 80mg  (J1040) Ketorolac-Toradol 15mg  (U0454) Admin of Therapeutic Inj  intramuscular or subcutaneous (09811) Orthopedic Referral (Ortho) Medicare Electronic Prescription [phone])  Problem # 3:  HYPERLIPIDEMIA (ICD-272.4) Assessment: Comment Only Low fat dietdiscussed and encouraged  Orders: T-Lipid Profile ([id number])  Labs Reviewed: SGOT: 18 (09/07/2008)   SGPT: 12 (09/07/2008)   HDL:65 (01/12/2010), 74 (09/07/2008)  LDL:131 (01/12/2010), 129 (09/07/2008)  Chol:208 (01/12/2010), 220  (09/07/2008)  Trig:62 (01/12/2010), 85 (09/07/2008)  Problem # 4:  GOITER, UNSPECIFIED (ICD-240.9) Assessment: Comment Only  Orders: Radiology Referral (Radiology)  Complete Medication List: 1)  Tylenol 325 Mg Tabs (Acetaminophen) .... As needed 2)  Oscal 500/200 D-3 500-200 Mg-unit Tabs (Calcium-vitamin d) .... One tab by mouth three times a day 3)  Multivitamins Tabs (Multiple vitamin) .... Take 1 tablet by mouth once a day 4)  Ibuprofen 800 Mg Tabs (Ibuprofen) .... Take 1 tablet by mouth three times a day , start 05/04/2010 5)  Claritin 10 Mg Tabs (Loratadine) .... Take 1 tablet by mouth once a day 6)  Aspirin 81 Mg Chew (Aspirin) .... Take 1 tablet by mouth once a day 7)  Tramadol Hcl 50 Mg Tabs (Tramadol hcl) .... One tablet at bedtime for left knee pain 8)  Citalopram Hydrobromide 10 Mg Tabs (Citalopram hydrobromide) .... Take 1 tablet by mouth once a day  Other Orders: T-Basic Metabolic Panel [phone]) T-CBC w/Diff [id number]) T-TSH [phone])  Patient Instructions: 1)  Please schedule a follow-up appointment in 2.5 months. 2)  You need to lose weight. Consider a lower calorie diet and regular exercise.  3)  BMP prior to visit, ICD-9: 4)  Lipid Panel prior to visit, ICD-9: 5)  TSH prior to visit, ICD-9:  fasting asap 6)  CBC w/ Diff prior to visit, ICD-9: 7)  You are being referred  foer a mamogram and  a 2nd opinion on the kneee 8)  You  will be started on new med for depression and to a therapist Prescriptions: CITALOPRAM HYDROBROMIDE 10 MG TABS (CITALOPRAM HYDROBROMIDE) Take 1 tablet by mouth once a day  #30 x 2   Entered and Authorized by:   Syliva Overman MD   Signed by:   Syliva Overman MD on 01/30/2011   Method used:   Electronically to        [facility]* (retail)       [address]       [place], [place]  [address]       Ph: [phone]       Fax: [phone]   RxID:   [id number] TRAMADOL HCL 50 MG TABS  (TRAMADOL HCL) one tablet at bedtime for left knee pain  #30 x 2   Entered and Authorized by:   Syliva Overman MD   Signed by:   Syliva Overman MD on 01/30/2011   Method used:   Electronically to        [facility] (retail)       [address] [place], [place]  [address]       Ph: [phone]       Fax: [phone]   RxID:   [id number]    Medication Administration  Injection # 1:    Medication: Depo- Medrol 80mg     Diagnosis: KNEE PAIN, LEFT (ICD-719.46)    Route: IM    Site: RUOQ gluteus    Exp Date: 07/12    Lot #: [id number]    Mfr: Pharmacia    Patient tolerated injection without complications    Given by: Adella Hare LPN (January 30, 2011 9:28 AM)  Injection # 2:    Medication: Ketorolac-Toradol 15mg     Diagnosis: KNEE PAIN, LEFT (ICD-719.46)    Route: IM    Site: LUOQ gluteus    Exp Date: 05/10/2012    Lot #: [id number]    Mfr: [email or web address]    Comments: toradol 60mg  given    Patient tolerated injection without complications    Given by: Adella Hare LPN (January 30, 2011 9:29 AM)  Orders Added: 1)  Est. Patient Level IV [99214] 2)  T-Basic Metabolic Panel [[id number]] 3)  T-Lipid Profile [80061-22930] 4)  T-CBC w/Diff [[id number]] 5)  T-TSH [23557-32202] 6)  Depo- Medrol 80mg  [J1040] 7)  Ketorolac-Toradol 15mg  [J1885] 8)  Admin of Therapeutic Inj  intramuscular or subcutaneous [96372] 9)  Psychology Referral [Psychology] 10)  Orthopedic Referral [Ortho] 11)  Radiology Referral [Radiology] 12)  Medicare Electronic Prescription [[id number]]     Medication Administration  Injection # 1:    Medication: Depo- Medrol 80mg     Diagnosis: KNEE PAIN, LEFT (ICD-719.46)    Route: IM    Site: RUOQ gluteus    Exp Date: 07/12    Lot #: Gunnar Bulla    Mfr: Pharmacia    Patient tolerated injection without complications    Given by: Adella Hare LPN (January 30, 2011 9:28 AM)  Injection # 2:    Medication: Ketorolac-Toradol  15mg     Diagnosis: KNEE PAIN, LEFT (ICD-719.46)    Route: IM    Site: LUOQ gluteus    Exp Date: 05/10/2012    Lot #: [id number]    Mfr: [email or web address]    Comments: toradol 60mg  given    Patient tolerated injection without complications  Given by: Adella Hare LPN (January 30, 2011 9:29 AM)  Orders Added: 1)  Est. Patient Level IV [99214] 2)  T-Basic Metabolic Panel [phone] 3)  T-Lipid Profile [80061-22930] 4)  T-CBC w/Diff [[id number]] 5)  T-TSH [08657-84696] 6)  Depo- Medrol 80mg  [J1040] 7)  Ketorolac-Toradol 15mg  [J1885] 8)  Admin of Therapeutic Inj  intramuscular or subcutaneous [96372] 9)  Psychology Referral [Psychology] 10)  Orthopedic Referral [Ortho] 11)  Radiology Referral [Radiology] 12)  Medicare Electronic Prescription [phone]

## 2011-02-06 NOTE — Miscellaneous (Signed)
  Clinical Lists Changes  Medications: Added new medication of METFORMIN HCL 500 MG TABS (METFORMIN HCL) Take 1 tablet by mouth two times a day - Signed Rx of METFORMIN HCL 500 MG TABS (METFORMIN HCL) Take 1 tablet by mouth two times a day;  #60 x 3;  Signed;  Entered by: Syliva Overman MD;  Authorized by: Syliva Overman MD;  Method used: Historical    Prescriptions: METFORMIN HCL 500 MG TABS (METFORMIN HCL) Take 1 tablet by mouth two times a day  #60 x 3   Entered and Authorized by:   Syliva Overman MD   Signed by:   Syliva Overman MD on 02/03/2011   Method used:   Historical   RxID:   [id number]

## 2011-02-12 ENCOUNTER — Ambulatory Visit (HOSPITAL_COMMUNITY): Payer: Medicare HMO

## 2011-02-12 ENCOUNTER — Telehealth (INDEPENDENT_AMBULATORY_CARE_PROVIDER_SITE_OTHER): Payer: Self-pay | Admitting: *Deleted

## 2011-02-15 NOTE — Assessment & Plan Note (Signed)
Summary: diabetic teaching  Nurse Visit   Allergies: No Known Drug Allergies patient counseled on diabetes, was given new meter and shown how to use, informational booklets and [facility] diet. patient expressed understanding and will keep log of sugar to bring to next ov patient also aware new med sent in for diabetes Adella Hare LPN  February 06, 2011 3:46 PM

## 2011-02-15 NOTE — Letter (Signed)
Summary: add on lab  add on lab   Imported By: Lind Guest 02/05/2011 09:21:26  _____________________________________________________________________  External Attachment:    Type:   Image     Comment:   External Document

## 2011-02-15 NOTE — Letter (Signed)
Summary: orthopedic surgeon  orthopedic surgeon   Imported By: Lind Guest 02/06/2011 14:03:42  _____________________________________________________________________  External Attachment:    Type:   Image     Comment:   External Document

## 2011-02-20 NOTE — Progress Notes (Signed)
Summary: referral to [facility]  Phone Note Call from Patient   Summary of Call: called pt and left a message letting her know she had appt with dr. Kieth Brightly for 03/01/2011 1:00. Also if she has any questions to please call office back Initial call taken by: Rudene Anda,  February 12, 2011 2:36 PM

## 2011-03-01 ENCOUNTER — Ambulatory Visit (HOSPITAL_COMMUNITY): Payer: Medicare HMO | Admitting: Psychology

## 2011-03-04 LAB — COMPREHENSIVE METABOLIC PANEL
ALT: 11 U/L (ref 0–35)
Alkaline Phosphatase: 101 U/L (ref 39–117)
BUN: 20 mg/dL (ref 6–23)
CO2: 27 mEq/L (ref 19–32)
Glucose, Bld: 81 mg/dL (ref 70–99)
Potassium: 3.6 mEq/L (ref 3.5–5.1)
Sodium: 139 mEq/L (ref 135–145)
Total Bilirubin: 0.3 mg/dL (ref 0.3–1.2)

## 2011-03-04 LAB — CBC
HCT: 35.3 % — ABNORMAL LOW (ref 36.0–46.0)
Hemoglobin: 12.3 g/dL (ref 12.0–15.0)
MCV: 84.5 fL (ref 78.0–100.0)
RBC: 4.18 MIL/uL (ref 3.87–5.11)

## 2011-03-07 ENCOUNTER — Encounter: Payer: Self-pay | Admitting: Family Medicine

## 2011-03-09 ENCOUNTER — Encounter: Payer: Self-pay | Admitting: Family Medicine

## 2011-03-12 ENCOUNTER — Ambulatory Visit: Payer: Medicare HMO | Admitting: Family Medicine

## 2011-05-03 ENCOUNTER — Encounter: Payer: Self-pay | Admitting: Family Medicine

## 2011-05-04 ENCOUNTER — Encounter: Payer: Self-pay | Admitting: Family Medicine

## 2011-05-04 ENCOUNTER — Ambulatory Visit: Payer: Medicare HMO | Admitting: Family Medicine

## 2011-09-07 LAB — CBC
HCT: 37.6
Hemoglobin: 12.4
MCHC: 33.1
MCV: 87.6
Platelets: 175
RDW: 14.3

## 2011-09-07 LAB — URINE CULTURE: Colony Count: 3000

## 2011-09-07 LAB — COMPREHENSIVE METABOLIC PANEL
AST: 24
BUN: 21
Potassium: 3.7
Total Protein: 6.3

## 2011-09-07 LAB — URINALYSIS, ROUTINE W REFLEX MICROSCOPIC
Ketones, ur: NEGATIVE
Specific Gravity, Urine: >1.03 — ABNORMAL HIGH

## 2011-09-07 LAB — DIFFERENTIAL
Basophils Absolute: 0.1
Basophils Relative: 1
Eosinophils Absolute: 0.2
Lymphs Abs: 1.7
Monocytes Absolute: 0.6
Neutrophils Relative %: 68

## 2013-08-27 ENCOUNTER — Telehealth: Payer: Self-pay | Admitting: Family Medicine

## 2013-08-27 NOTE — Telephone Encounter (Signed)
Patient coming tomorrow

## 2013-08-27 NOTE — Telephone Encounter (Signed)
Aware she needs to schedule appt. States she will when she comes in the am

## 2013-08-28 ENCOUNTER — Ambulatory Visit (INDEPENDENT_AMBULATORY_CARE_PROVIDER_SITE_OTHER): Payer: Medicare Other

## 2013-08-28 VITALS — BP 120/78 | Wt 164.8 lb

## 2013-08-28 DIAGNOSIS — Z23 Encounter for immunization: Secondary | ICD-10-CM

## 2013-08-28 DIAGNOSIS — Z111 Encounter for screening for respiratory tuberculosis: Secondary | ICD-10-CM

## 2013-08-28 NOTE — Progress Notes (Signed)
Patient had PPD placed in her left forearm with no complications

## 2013-08-28 NOTE — Progress Notes (Signed)
  Subjective:    Patient ID: Bianca Mcguire, female    DOB: 1946-05-05, 67 y.o.   MRN: [id number]  HPI    Review of Systems     Objective:   Physical Exam        Assessment & Plan:

## 2013-08-31 LAB — TB SKIN TEST
Induration: 0 mm
TB Skin Test: NEGATIVE

## 2013-09-15 ENCOUNTER — Ambulatory Visit (INDEPENDENT_AMBULATORY_CARE_PROVIDER_SITE_OTHER): Payer: Medicare Other | Admitting: Family Medicine

## 2013-09-15 ENCOUNTER — Encounter: Payer: Self-pay | Admitting: Family Medicine

## 2013-09-15 ENCOUNTER — Other Ambulatory Visit: Payer: Self-pay | Admitting: Family Medicine

## 2013-09-15 VITALS — BP 130/80 | HR 76 | Resp 16 | Ht 62.0 in | Wt 166.0 lb

## 2013-09-15 DIAGNOSIS — M069 Rheumatoid arthritis, unspecified: Secondary | ICD-10-CM

## 2013-09-15 DIAGNOSIS — Z1239 Encounter for other screening for malignant neoplasm of breast: Secondary | ICD-10-CM

## 2013-09-15 DIAGNOSIS — R7302 Impaired glucose tolerance (oral): Secondary | ICD-10-CM

## 2013-09-15 DIAGNOSIS — E785 Hyperlipidemia, unspecified: Secondary | ICD-10-CM

## 2013-09-15 DIAGNOSIS — E559 Vitamin D deficiency, unspecified: Secondary | ICD-10-CM

## 2013-09-15 DIAGNOSIS — Z23 Encounter for immunization: Secondary | ICD-10-CM

## 2013-09-15 DIAGNOSIS — R7309 Other abnormal glucose: Secondary | ICD-10-CM

## 2013-09-15 DIAGNOSIS — E039 Hypothyroidism, unspecified: Secondary | ICD-10-CM

## 2013-09-15 DIAGNOSIS — E669 Obesity, unspecified: Secondary | ICD-10-CM

## 2013-09-15 NOTE — Patient Instructions (Addendum)
Pelvic and breast  in January  Pneumonia vaccine today  Labs today CBC, lipids , cmp, TSH , vit D, HBa1C  You will be referred to Dr Corliss Skains re rheumatoid arthritis  Mammogram will be scheduled

## 2013-09-16 LAB — HEMOGLOBIN A1C: Mean Plasma Glucose: 114 mg/dL (ref <117)

## 2013-09-16 LAB — LIPID PANEL
Cholesterol: 190 mg/dL (ref 0–200)
LDL Cholesterol: 110 mg/dL — ABNORMAL HIGH (ref 0–99)
Total CHOL/HDL Ratio: 2.9 Ratio
VLDL: 14 mg/dL (ref 0–40)

## 2013-09-16 LAB — CBC
Hemoglobin: 12.7 g/dL (ref 12.0–15.0)
MCH: 28.8 pg (ref 26.0–34.0)
MCHC: 33.3 g/dL (ref 30.0–36.0)
MCV: 86.4 fL (ref 78.0–100.0)
Platelets: 187 10*3/uL (ref 150–400)
RBC: 4.41 MIL/uL (ref 3.87–5.11)
WBC: 6.3 10*3/uL (ref 4.0–10.5)

## 2013-09-16 LAB — COMPREHENSIVE METABOLIC PANEL
ALT: 12 U/L (ref 0–35)
Alkaline Phosphatase: 120 U/L — ABNORMAL HIGH (ref 39–117)
BUN: 19 mg/dL (ref 6–23)
CO2: 28 mEq/L (ref 19–32)
Chloride: 104 mEq/L (ref 96–112)
Creat: 0.72 mg/dL (ref 0.50–1.10)
Total Bilirubin: 0.5 mg/dL (ref 0.3–1.2)

## 2013-09-16 LAB — TSH: TSH: 11.392 u[IU]/mL — ABNORMAL HIGH (ref 0.350–4.500)

## 2013-09-17 LAB — VITAMIN D 25 HYDROXY (VIT D DEFICIENCY, FRACTURES): Vit D, 25-Hydroxy: 35 ng/mL (ref 30–89)

## 2013-09-17 LAB — T4, FREE: Free T4: 0.79 ng/dL — ABNORMAL LOW (ref 0.80–1.80)

## 2013-09-17 LAB — T3, FREE: T3, Free: 2.8 pg/mL (ref 2.3–4.2)

## 2013-09-18 ENCOUNTER — Ambulatory Visit (HOSPITAL_COMMUNITY)
Admission: RE | Admit: 2013-09-18 | Discharge: 2013-09-18 | Disposition: A | Discharge status: Home / Self Care | Payer: Medicare Other | Source: Ambulatory Visit | Attending: Family Medicine | Admitting: Family Medicine

## 2013-09-18 DIAGNOSIS — Z1231 Encounter for screening mammogram for malignant neoplasm of breast: Secondary | ICD-10-CM | POA: Insufficient documentation

## 2013-09-18 DIAGNOSIS — Z1239 Encounter for other screening for malignant neoplasm of breast: Secondary | ICD-10-CM

## 2013-09-20 DIAGNOSIS — R7302 Impaired glucose tolerance (oral): Secondary | ICD-10-CM | POA: Insufficient documentation

## 2013-09-20 DIAGNOSIS — E785 Hyperlipidemia, unspecified: Secondary | ICD-10-CM | POA: Insufficient documentation

## 2013-09-20 NOTE — Assessment & Plan Note (Signed)
Normal HBA1C in 2104, was 7.2 in 2011. Improved Patient educated about the importance of limiting  Carbohydrate intake , the need to commit to daily physical activity for a minimum of 30 minutes , and to commit weight loss. The fact that changes in all these areas will reduce or eliminate all together the development of diabetes is stressed.

## 2013-09-20 NOTE — Assessment & Plan Note (Signed)
Improved Lower intake of fatty food encouraged

## 2013-09-20 NOTE — Assessment & Plan Note (Signed)
Increased deformity and pain with limitation in mpobility refer to rheumatologist for furthe reval and mx

## 2013-09-20 NOTE — Assessment & Plan Note (Signed)
More marked elevation of TSh, needs thyroid US and replacement

## 2013-09-20 NOTE — Assessment & Plan Note (Signed)
Deteriorated. Patient re-educated about  the importance of commitment to a  minimum of 150 minutes of exercise per week. The importance of healthy food choices with portion control discussed. Encouraged to start a food diary, count calories and to consider  joining a support group. Sample diet sheets offered. Goals set by the patient for the next several months.    

## 2013-09-20 NOTE — Progress Notes (Signed)
  Subjective:    Patient ID: Bianca Mcguire, female    DOB: January 22, 1946, 67 y.o.   MRN: [id number]  HPI The PT is here for follow up and re-evaluation of chronic medical conditions, medication management and review of any available recent lab and radiology data. Has not been seen for over 2 years Preventive health is updated, specifically  Cancer screening and Immunization.  Needs to catch up with routine tests   Her only stated concern is about increased hand pain and stiffness due to rheumatoid disease which is present in her family, she is interested in eval by a specialist about this She has worked on food choice with weight loss and states she is not diabetic, when she tests her blood sugars they are normal    Review of Systems See HPI Denies recent fever or chills. Denies sinus pressure, nasal congestion, ear pain or sore throat. Denies chest congestion, productive cough or wheezing. Denies chest pains, palpitations and leg swelling Denies abdominal pain, nausea, vomiting,diarrhea or constipation.   Denies dysuria, frequency, hesitancy or incontinence.  Denies headaches, seizures, numbness, or tingling. Denies depression, anxiety or insomnia. Denies skin break down or rash.        Objective:   Physical Exam  Patient alert and oriented and in no cardiopulmonary distress.  HEENT: No facial asymmetry, EOMI, no sinus tenderness,  oropharynx pink and moist.  Neck supple no adenopathy.  Chest: Clear to auscultation bilaterally.  CVS: S1, S2 no murmurs, no S3.  ABD: Soft non tender. Bowel sounds normal.  Ext: No edema  MS: Adequate ROM spine, shoulders, hips and knees.Deformitey of hands with joint swelling and stiffness, and ulnar deviaition of the hands  Skin: Intact, no ulcerations or rash noted.  Psych: Good eye contact, normal affect. Memory intact not anxious or depressed appearing.  CNS: CN 2-12 intact, power, tone and sensation normal throughout.        Assessment & Plan:

## 2013-09-20 NOTE — Assessment & Plan Note (Signed)
Resolved, normal level in 2014

## 2013-09-24 ENCOUNTER — Ambulatory Visit (HOSPITAL_COMMUNITY)
Admission: RE | Admit: 2013-09-24 | Discharge: 2013-09-24 | Disposition: A | Discharge status: Home / Self Care | Payer: Medicare Other | Source: Ambulatory Visit | Attending: Family Medicine | Admitting: Family Medicine

## 2013-09-24 DIAGNOSIS — E039 Hypothyroidism, unspecified: Secondary | ICD-10-CM

## 2013-09-24 DIAGNOSIS — E049 Nontoxic goiter, unspecified: Secondary | ICD-10-CM | POA: Insufficient documentation

## 2013-10-07 ENCOUNTER — Telehealth: Payer: Self-pay | Admitting: Family Medicine

## 2013-10-12 NOTE — Telephone Encounter (Signed)
Patient is aware 

## 2013-12-22 ENCOUNTER — Encounter: Payer: Medicare Other | Admitting: Family Medicine

## 2014-04-08 ENCOUNTER — Encounter (INDEPENDENT_AMBULATORY_CARE_PROVIDER_SITE_OTHER): Payer: Self-pay

## 2014-04-08 ENCOUNTER — Ambulatory Visit (INDEPENDENT_AMBULATORY_CARE_PROVIDER_SITE_OTHER): Payer: Commercial Managed Care - HMO | Admitting: Family Medicine

## 2014-04-08 ENCOUNTER — Encounter: Payer: Self-pay | Admitting: Family Medicine

## 2014-04-08 VITALS — BP 142/76 | HR 64 | Resp 18 | Ht 62.0 in | Wt 166.1 lb

## 2014-04-08 DIAGNOSIS — Z13820 Encounter for screening for osteoporosis: Secondary | ICD-10-CM

## 2014-04-08 DIAGNOSIS — M25519 Pain in unspecified shoulder: Secondary | ICD-10-CM

## 2014-04-08 DIAGNOSIS — M25512 Pain in left shoulder: Secondary | ICD-10-CM

## 2014-04-08 DIAGNOSIS — M1710 Unilateral primary osteoarthritis, unspecified knee: Secondary | ICD-10-CM

## 2014-04-08 DIAGNOSIS — J019 Acute sinusitis, unspecified: Secondary | ICD-10-CM

## 2014-04-08 DIAGNOSIS — J209 Acute bronchitis, unspecified: Secondary | ICD-10-CM | POA: Insufficient documentation

## 2014-04-08 DIAGNOSIS — IMO0002 Reserved for concepts with insufficient information to code with codable children: Secondary | ICD-10-CM

## 2014-04-08 DIAGNOSIS — M069 Rheumatoid arthritis, unspecified: Secondary | ICD-10-CM

## 2014-04-08 MED ORDER — METHYLPREDNISOLONE ACETATE 80 MG/ML IJ SUSP
80.0000 mg | Freq: Once | INTRAMUSCULAR | Status: AC
  Administered 2014-04-08: 80 mg via INTRAMUSCULAR

## 2014-04-08 MED ORDER — PENICILLIN V POTASSIUM 500 MG PO TABS: 500.0000 mg | ORAL_TABLET | Freq: Three times a day (TID) | ORAL | Status: DC

## 2014-04-08 MED ORDER — KETOROLAC TROMETHAMINE 60 MG/2ML IM SOLN
60.0000 mg | Freq: Once | INTRAMUSCULAR | Status: AC
  Administered 2014-04-08: 60 mg via INTRAMUSCULAR

## 2014-04-08 MED ORDER — BENZONATATE 100 MG PO CAPS: 100.0000 mg | ORAL_CAPSULE | Freq: Two times a day (BID) | ORAL | Status: DC | PRN

## 2014-04-08 MED ORDER — PREDNISONE 5 MG PO TABS: 5.0000 mg | ORAL_TABLET | Freq: Two times a day (BID) | ORAL | Status: AC

## 2014-04-08 NOTE — Patient Instructions (Addendum)
Annual physical exam in 2.5 month, call if you need me before   You are treated for acute sinusitis and bronchitis, penicillin and decongestant are sent to your pharmacy.  You will get injections in the office today for left shoulder and knee pain, also prednisone is sent in for 5 days.  Please get x rays of the shoulder and knee    You will receive a script for a clawed cane, be careful not to fall, I recommend orthopedic evaluation of the knee when you are able.   Handicap sticker will be provided today  You are referred for a bone density test  Please call and schedule f/u with Dr Estanislado Pandy

## 2014-04-08 NOTE — Progress Notes (Signed)
   Subjective:    Patient ID: Bianca Mcguire, female    DOB: 1946/10/26, 68 y.o.   MRN: [id number]  HPI 1 week h/o worsening head and chest congestion, associated with fever and chills intermittently. Nasal drainage has thickened , and is yellowish green, and at times bloody. Sputum is thick and yellow. C/o bilateral ear pressure, denies hearing loss and sore throat. Increasing fatigue , poor appetitie and sleep disturbed by cough. No improvement with OTC medication. 3 week h/o  Acute onset left shoulder pain and reduced mobility, no trauma, also increased left knee pain and instability , want s to hold on ortho eval at this time, will  Return to rheumatology for treatment of RA Celebrating birht of great grand and grandchild since last visit    Review of Systems See HPI  Denies chest pains, palpitations and leg swelling Denies abdominal pain, nausea, vomiting,diarrhea or constipation.   Denies dysuria, frequency, hesitancy or incontinence.  Denies headaches, seizures, numbness, or tingling. Denies depression, anxiety or insomnia. Denies skin break down or rash.        Objective:   Physical Exam  BP 142/76  Pulse 64  Resp 18  Ht 5\' 2"  (1.575 m)  Wt 166 lb 1.9 oz (75.352 kg)  BMI 30.38 kg/m2  SpO2 95% Patient alert and oriented and in no cardiopulmonary distress.  HEENT: No facial asymmetry, EOMI, frontal and ,maxillary  sinus tenderness,  oropharynx pink and moist.  Neck supple no adenopathy.TM clear  Chest: Adequate iar entry,  Crackles no wheezes CVS: S1, S2 no murmurs, no S3.  ABD: Soft non tender. Bowel sounds normal.  Ext: No edema  MS: decreased  ROM spine, left shoulders, and both  Knees left worse than right with severe deformity  Skin: Intact, no ulcerations or rash noted.  Psych: Good eye contact, normal affect. Memory intact mildly anxious and teraful at times as she discusses family stresses, denies being   CNS: CN 2-12 intact, power, tone and  sensation normal throughout.       Assessment & Plan:  Acute bronchitis Decongestant and antibiotic prescribed  OSTEOARTHRITIS, KNEES, BILATERAL Increased lleft knee pain  , deformity and instability, short course of prednisone pt wishes to hold on ortho eval at this time, xray to be obtained Fall precautions discussed. Handicap sticker provided also script for cane due to fall risk  SHOULDER PAIN, LEFT Acute pain and reduced mobility in last several weeks , no trauma, depo medrol followed by oral prednisone and xray, likely tendinitis /bursitis  RHEUMATOID ARTHRITIS Pt to follow up with rheumatologist to start specific treatment  Acute sinusitis Antibiotic prescribed

## 2014-04-09 ENCOUNTER — Ambulatory Visit (HOSPITAL_COMMUNITY)
Admission: RE | Admit: 2014-04-09 | Discharge: 2014-04-09 | Disposition: A | Discharge status: Home / Self Care | Payer: Medicare HMO | Source: Ambulatory Visit | Attending: Family Medicine | Admitting: Family Medicine

## 2014-04-09 DIAGNOSIS — M25512 Pain in left shoulder: Secondary | ICD-10-CM

## 2014-04-09 DIAGNOSIS — M1710 Unilateral primary osteoarthritis, unspecified knee: Secondary | ICD-10-CM

## 2014-04-09 DIAGNOSIS — M25569 Pain in unspecified knee: Secondary | ICD-10-CM | POA: Insufficient documentation

## 2014-04-09 DIAGNOSIS — J019 Acute sinusitis, unspecified: Secondary | ICD-10-CM | POA: Insufficient documentation

## 2014-04-09 DIAGNOSIS — IMO0002 Reserved for concepts with insufficient information to code with codable children: Secondary | ICD-10-CM

## 2014-04-09 DIAGNOSIS — M25469 Effusion, unspecified knee: Secondary | ICD-10-CM | POA: Insufficient documentation

## 2014-04-09 DIAGNOSIS — M25519 Pain in unspecified shoulder: Secondary | ICD-10-CM | POA: Insufficient documentation

## 2014-04-09 NOTE — Assessment & Plan Note (Signed)
Antibiotic prescribed 

## 2014-04-09 NOTE — Assessment & Plan Note (Signed)
Decongestant and antibiotic prescribed 

## 2014-04-09 NOTE — Assessment & Plan Note (Signed)
Acute pain and reduced mobility in last several weeks , no trauma, depo medrol followed by oral prednisone and xray, likely tendinitis Orpah Greek

## 2014-04-09 NOTE — Assessment & Plan Note (Signed)
Increased lleft knee pain  , deformity and instability, short course of prednisone pt wishes to hold on ortho eval at this time, xray to be obtained Fall precautions discussed. Handicap sticker provided also script for cane due to fall risk

## 2014-04-09 NOTE — Assessment & Plan Note (Signed)
Pt to follow up with rheumatologist to start specific treatment

## 2014-04-21 ENCOUNTER — Encounter: Payer: Self-pay | Admitting: Family Medicine

## 2014-04-21 ENCOUNTER — Ambulatory Visit (HOSPITAL_COMMUNITY)
Admission: RE | Admit: 2014-04-21 | Discharge: 2014-04-21 | Disposition: A | Discharge status: Home / Self Care | Payer: Medicare HMO | Source: Ambulatory Visit | Attending: Family Medicine | Admitting: Family Medicine

## 2014-04-21 DIAGNOSIS — Z13820 Encounter for screening for osteoporosis: Secondary | ICD-10-CM | POA: Insufficient documentation

## 2014-04-21 DIAGNOSIS — M810 Age-related osteoporosis without current pathological fracture: Secondary | ICD-10-CM | POA: Insufficient documentation

## 2014-05-05 ENCOUNTER — Other Ambulatory Visit: Payer: Self-pay

## 2014-05-05 MED ORDER — ALENDRONATE SODIUM 70 MG PO TABS: 70.0000 mg | ORAL_TABLET | ORAL | Status: DC

## 2014-06-07 ENCOUNTER — Ambulatory Visit (INDEPENDENT_AMBULATORY_CARE_PROVIDER_SITE_OTHER): Payer: Commercial Managed Care - HMO | Admitting: Family Medicine

## 2014-06-07 ENCOUNTER — Encounter: Payer: Self-pay | Admitting: Family Medicine

## 2014-06-07 VITALS — BP 128/82 | HR 77 | Resp 18 | Wt 166.0 lb

## 2014-06-07 DIAGNOSIS — M549 Dorsalgia, unspecified: Secondary | ICD-10-CM | POA: Diagnosis not present

## 2014-06-07 DIAGNOSIS — E039 Hypothyroidism, unspecified: Secondary | ICD-10-CM

## 2014-06-07 DIAGNOSIS — M5410 Radiculopathy, site unspecified: Secondary | ICD-10-CM | POA: Insufficient documentation

## 2014-06-07 DIAGNOSIS — R109 Unspecified abdominal pain: Secondary | ICD-10-CM | POA: Diagnosis not present

## 2014-06-07 DIAGNOSIS — M25569 Pain in unspecified knee: Secondary | ICD-10-CM

## 2014-06-07 DIAGNOSIS — M5416 Radiculopathy, lumbar region: Secondary | ICD-10-CM | POA: Insufficient documentation

## 2014-06-07 DIAGNOSIS — M79609 Pain in unspecified limb: Secondary | ICD-10-CM

## 2014-06-07 DIAGNOSIS — M810 Age-related osteoporosis without current pathological fracture: Secondary | ICD-10-CM

## 2014-06-07 DIAGNOSIS — M25562 Pain in left knee: Secondary | ICD-10-CM

## 2014-06-07 DIAGNOSIS — M79643 Pain in unspecified hand: Secondary | ICD-10-CM

## 2014-06-07 MED ORDER — KETOROLAC TROMETHAMINE 60 MG/2ML IM SOLN
60.0000 mg | Freq: Once | INTRAMUSCULAR | Status: AC
  Administered 2014-06-07: 60 mg via INTRAMUSCULAR

## 2014-06-07 MED ORDER — TRAMADOL HCL 50 MG PO TABS: 50.0000 mg | ORAL_TABLET | Freq: Two times a day (BID) | ORAL | Status: DC

## 2014-06-07 MED ORDER — ACETAMINOPHEN 500 MG PO TABS: ORAL_TABLET | ORAL | Status: DC

## 2014-06-07 MED ORDER — ALENDRONATE SODIUM 70 MG PO TABS: 70.0000 mg | ORAL_TABLET | ORAL | Status: DC

## 2014-06-07 NOTE — Progress Notes (Signed)
   Subjective:    Patient ID: Bianca Mcguire, female    DOB: December 06, 1946, 68 y.o.   MRN: [id number]  HPI 1 week h/o severe left knee pain unprovoked, which prevented her from walking for 3 days, needed a walker, ins has denied her a cane which was prescribed before  1 day h/o intermittent back pain, localized, initially awoke pt up to a 10, non radiating feels pulsating, throbbing, has had in the past.    Review of Systems See HPI Denies recent fever or chills. Denies sinus pressure, nasal congestion, ear pain or sore throat. Denies chest congestion, productive cough or wheezing. Denies chest pains, palpitations and leg swelling Denies nausea, vomiting,diarrhea or constipation.   Denies dysuria, frequency, hesitancy or incontinence.  Denies headaches, seizures, numbness, or tingling. Denies skin break down or rash.        Objective:   Physical Exam BP 128/82  Pulse 77  Resp 18  Wt 166 lb (75.297 kg)  SpO2 98% Rept BP that I obtained was 150/86  Patient alert and oriented and in no cardiopulmonary distress.Pt in pain , markedly reduced mobility due to knee and back pain  HEENT: No facial asymmetry, EOMI,   oropharynx pink and moist.  Neck supple no JVD, no mass.  Chest: Clear to auscultation bilaterally.  CVS: S1, S2 no murmurs, no S3.Regular rate.  ABD: Soft , lower abdominal tenderness, no guarding or rebound. Bruit heard , no palable organomegaly or mass  Ext: No edema  MS: decreased ROM lumbar  spine,  and left knee which is swollen and tender, Marked deformity of digits in hands c/w rheumatoid disease.  Skin: Intact, no ulcerations or rash noted.  Psych: Good eye contact, normal affect. Memory intact not anxious or depressed appearing.  CNS: CN 2-12 intact, power,  normal throughout.no focal deficits noted.        Assessment & Plan:  HYPOTHYROIDISM Updated lab needed, past due  Back pain without radiation Anti inflammatory injection in office,  start tramadol and tylenol, needs cane to assist with ambulation also  Abdominal pain, unspecified site Sever abdominal pain radiating to back abdominal bruit heard , needs US abdomen for evaluation   KNEE PAIN, LEFT Worsened with increased difficulty in ambulation refer ortho  HAND PAIN, BILATERAL Evaluated by rheumatology has rheumatoid arthritis and will start definitve treatment  Osteoporosis Needs to start weekly fosamax

## 2014-06-07 NOTE — Patient Instructions (Addendum)
F/u as before  New for back and knee pain are tylenol and tramadol, stop ibuprofen  Toradol in office today also xray of your low back  New for  osteoperosis is once weekly fosamax, and ensure you take calcium and vitD every day also pls  You are referred to dr Aline Brochure re left knee  And we will f/u on why the insurance would not cover the cane  You are referred for US abdomen to ensure no aneurysm in the abdomen is causing the sharp low back pain you are having  Blood pressure is high today we will re evaluate at next visit, pls reduce salt intake and portion size

## 2014-06-08 ENCOUNTER — Ambulatory Visit (HOSPITAL_COMMUNITY)
Admission: RE | Admit: 2014-06-08 | Discharge: 2014-06-08 | Disposition: A | Discharge status: Home / Self Care | Payer: Medicare HMO | Source: Ambulatory Visit | Attending: Family Medicine | Admitting: Family Medicine

## 2014-06-08 ENCOUNTER — Ambulatory Visit (HOSPITAL_COMMUNITY): Payer: Medicare HMO

## 2014-06-08 DIAGNOSIS — M549 Dorsalgia, unspecified: Secondary | ICD-10-CM

## 2014-06-08 DIAGNOSIS — M47817 Spondylosis without myelopathy or radiculopathy, lumbosacral region: Secondary | ICD-10-CM | POA: Insufficient documentation

## 2014-06-27 ENCOUNTER — Telehealth: Payer: Self-pay | Admitting: Family Medicine

## 2014-06-27 DIAGNOSIS — E039 Hypothyroidism, unspecified: Secondary | ICD-10-CM

## 2014-06-27 NOTE — Assessment & Plan Note (Signed)
Evaluated by rheumatology has rheumatoid arthritis and will start definitve treatment

## 2014-06-27 NOTE — Assessment & Plan Note (Signed)
Anti inflammatory injection in office, start tramadol and tylenol, needs cane to assist with ambulation also

## 2014-06-27 NOTE — Assessment & Plan Note (Signed)
Needs to start weekly fosamax

## 2014-06-27 NOTE — Assessment & Plan Note (Addendum)
Updated lab needed , past due.  

## 2014-06-27 NOTE — Assessment & Plan Note (Signed)
Worsened with increased difficulty in ambulation refer ortho

## 2014-06-27 NOTE — Assessment & Plan Note (Signed)
Sever abdominal pain radiating to back abdominal bruit heard , needs US abdomen for evaluation

## 2014-06-27 NOTE — Telephone Encounter (Signed)
pls contact pt , has appt on 7/27, she needs TSH drawn prior to this lab in 2014 suggested underactive gland , pls order TSh , free t3 and free  T4 , requests asp lab draw please

## 2014-06-28 NOTE — Addendum Note (Signed)
Addended by: Eual Fines on: 06/28/2014 01:13 PM   Modules accepted: Orders

## 2014-06-28 NOTE — Telephone Encounter (Signed)
Pt aware and lab order put up front to be collected this week

## 2014-06-30 LAB — T3, FREE: T3, Free: 2.8 pg/mL (ref 2.3–4.2)

## 2014-06-30 LAB — TSH: TSH: 15.114 u[IU]/mL — ABNORMAL HIGH (ref 0.350–4.500)

## 2014-06-30 LAB — T4, FREE: FREE T4: 0.78 ng/dL — AB (ref 0.80–1.80)

## 2014-07-05 ENCOUNTER — Encounter: Payer: Self-pay | Admitting: Family Medicine

## 2014-07-05 ENCOUNTER — Other Ambulatory Visit (HOSPITAL_COMMUNITY)
Admission: RE | Admit: 2014-07-05 | Discharge: 2014-07-05 | Disposition: A | Discharge status: Home / Self Care | Payer: Medicare HMO | Source: Ambulatory Visit | Attending: Family Medicine | Admitting: Family Medicine

## 2014-07-05 ENCOUNTER — Ambulatory Visit (INDEPENDENT_AMBULATORY_CARE_PROVIDER_SITE_OTHER): Payer: Medicare HMO | Admitting: Family Medicine

## 2014-07-05 VITALS — BP 122/80 | HR 71 | Resp 16 | Ht 62.0 in | Wt 169.4 lb

## 2014-07-05 DIAGNOSIS — M545 Low back pain, unspecified: Secondary | ICD-10-CM

## 2014-07-05 DIAGNOSIS — Z124 Encounter for screening for malignant neoplasm of cervix: Secondary | ICD-10-CM | POA: Insufficient documentation

## 2014-07-05 DIAGNOSIS — E039 Hypothyroidism, unspecified: Secondary | ICD-10-CM

## 2014-07-05 DIAGNOSIS — Z0000 Encounter for general adult medical examination without abnormal findings: Secondary | ICD-10-CM | POA: Insufficient documentation

## 2014-07-05 DIAGNOSIS — Z23 Encounter for immunization: Secondary | ICD-10-CM | POA: Insufficient documentation

## 2014-07-05 DIAGNOSIS — R2681 Unsteadiness on feet: Secondary | ICD-10-CM | POA: Insufficient documentation

## 2014-07-05 DIAGNOSIS — R269 Unspecified abnormalities of gait and mobility: Secondary | ICD-10-CM

## 2014-07-05 DIAGNOSIS — Z1211 Encounter for screening for malignant neoplasm of colon: Secondary | ICD-10-CM

## 2014-07-05 DIAGNOSIS — E785 Hyperlipidemia, unspecified: Secondary | ICD-10-CM

## 2014-07-05 DIAGNOSIS — Z0100 Encounter for examination of eyes and vision without abnormal findings: Secondary | ICD-10-CM

## 2014-07-05 DIAGNOSIS — R7301 Impaired fasting glucose: Secondary | ICD-10-CM

## 2014-07-05 LAB — POC HEMOCCULT BLD/STL (OFFICE/1-CARD/DIAGNOSTIC): Fecal Occult Blood, POC: POSITIVE

## 2014-07-05 MED ORDER — TRAMADOL HCL 50 MG PO TABS: ORAL_TABLET | ORAL | Status: DC

## 2014-07-05 MED ORDER — LEVOTHYROXINE SODIUM 25 MCG PO TABS: 25.0000 ug | ORAL_TABLET | Freq: Every day | ORAL | Status: DC

## 2014-07-05 NOTE — Progress Notes (Signed)
   Subjective:    Patient ID: Bianca Mcguire, female    DOB: Oct 05, 1946, 68 y.o.   MRN: [id number]  HPI Patient is in for annual physical exam. Pain medication needs for back and lower extremity pain are addressed, and pt is referred to PT due to unsteady gait. Pt has significantly elevated TSH levels and needs to start supplemental med, repeat thyroid US and basic labs need to be updated prevnar is administered also  Review of Systems See HPI     Objective:   Physical Exam BP 122/80  Pulse 71  Resp 16  Ht 5\' 2"  (1.575 m)  Wt 169 lb 6.4 oz (76.839 kg)  BMI 30.98 kg/m2  SpO2 96%  Pleasant well nourished female, alert and oriented x 3, in no cardio-pulmonary distress. Afebrile. HEENT No facial trauma or asymetry. Sinuses non tender.  Extra occullar muscles intact, pupils equally reactive to light. External ears normal, tympanic membranes clear. Oropharynx moist, no exudate, fair  dentition. Neck: supple, no adenopathy,JVD or thyromegaly.No bruits.  Chest: Clear to ascultation bilaterally.No crackles or wheezes. Non tender to palpation  Breast: No asymetry,no masses or lumps. No tenderness. No nipple discharge or inversion. No axillary or supraclavicular adenopathy  Cardiovascular system; Heart sounds normal,  S1 and  S2 ,no S3.  No murmur, or thrill. Apical beat not displaced Peripheral pulses normal.  Abdomen: Soft, non tender, no organomegaly or masses. No bruits. Bowel sounds normal. No guarding, tenderness or rebound.  Rectal:  Normal sphincter tone. No mass.No rectal masses.  Guaiac negative stool.  GU: External genitalia normal female genitalia , female distribution of hair. No lesions. Urethral meatus normal in size, no  Prolapse, no lesions visibly  Present. Bladder non tender. Vagina pink and moist , with no visible lesions , discharge present . Adequate pelvic support no  cystocele or rectocele noted Cervix pink and appears healthy, no lesions  or ulcerations noted, no discharge noted from os Uterus normal size, no adnexal masses, no cervical motion or adnexal tenderness.   Musculoskeletal exam: Decreased l ROM of spine, hips , shoulders and knees. Deformity ,swelling and  crepitus noted. No muscle wasting or atrophy.   Neurologic: Cranial nerves 2 to 12 intact. Power, tone ,sensation and reflexes normal throughout. No disturbance in gait. No tremor.  Skin: Intact, no ulceration, erythema , scaling or rash noted. Pigmentation normal throughout  Psych; Normal mood and affect. Judgement and concentration normal        Assessment & Plan:  Encounter for annual physical exam Annual exam as documented. Counseling done  re healthy lifestyle involving commitment to 150 minutes exercise per week, heart healthy diet, and attaining healthy weight.The importance of adequate sleep also discussed. Regular seat belt use and safe storage  of firearms if patient has them, is also discussed. Changes in health habits are decided on by the patient with goals and time frames  set for achieving them. Immunization and cancer screening needs are specifically addressed at this visit.   Need for vaccination with 13-polyvalent pneumococcal conjugate vaccine Vaccine administered  LOW BACK PAIN Improved Tramadol 1 in the morning and 2 at bedtime, dose inc  Adult hypothyroidism Deterioration in function start synthroid, rept thyroid US and rept TSH in 2 month  Unsteady gait High fall riskl will refer for PT

## 2014-07-05 NOTE — Assessment & Plan Note (Signed)
Improved Tramadol 1 in the morning and 2 at bedtime, dose inc

## 2014-07-05 NOTE — Patient Instructions (Addendum)
F/u mid October, call if you need me before  New for your thyroid gland is synthroid one tablet every morning on an empty stomach, wait 30 min to 60 min before eating  You are referred for PT  New for pain is TWO tramadol at bedtime and continue one in the day. If too sleepy call back for a change to gabapentin at bedtime  Prevnar today  Speak with referral staff member before you leave for [place] date   You will be referred to eye Doc  As we discussed  Fasting lipid, tSH , HBa1C and chem 7 2nd week in September pls

## 2014-07-05 NOTE — Assessment & Plan Note (Signed)
Vaccine administered.

## 2014-07-05 NOTE — Assessment & Plan Note (Signed)
High fall riskl will refer for PT

## 2014-07-05 NOTE — Assessment & Plan Note (Signed)
Annual exam as documented. Counseling done  re healthy lifestyle involving commitment to 150 minutes exercise per week, heart healthy diet, and attaining healthy weight.The importance of adequate sleep also discussed. Regular seat belt use and safe storage  of firearms if patient has them, is also discussed. Changes in health habits are decided on by the patient with goals and time frames  set for achieving them. Immunization and cancer screening needs are specifically addressed at this visit.  

## 2014-07-05 NOTE — Assessment & Plan Note (Addendum)
Deterioration in function start synthroid, rept thyroid US and rept TSH in 2 month

## 2014-07-07 ENCOUNTER — Ambulatory Visit (HOSPITAL_COMMUNITY): Admission: RE | Admit: 2014-07-07 | Payer: Medicare HMO | Source: Ambulatory Visit

## 2014-07-07 LAB — CYTOLOGY - PAP

## 2014-07-22 ENCOUNTER — Encounter: Payer: Self-pay | Admitting: Orthopedic Surgery

## 2014-07-22 ENCOUNTER — Ambulatory Visit (INDEPENDENT_AMBULATORY_CARE_PROVIDER_SITE_OTHER): Payer: Medicare HMO | Admitting: Orthopedic Surgery

## 2014-07-22 VITALS — BP 121/70 | Ht 62.0 in | Wt 169.4 lb

## 2014-07-22 DIAGNOSIS — M545 Low back pain, unspecified: Secondary | ICD-10-CM

## 2014-07-22 DIAGNOSIS — M069 Rheumatoid arthritis, unspecified: Secondary | ICD-10-CM

## 2014-07-22 MED ORDER — DICLOFENAC POTASSIUM 50 MG PO TABS: 50.0000 mg | ORAL_TABLET | Freq: Two times a day (BID) | ORAL | Status: DC

## 2014-07-22 NOTE — Progress Notes (Signed)
Subjective:     Patient ID: Bianca Mcguire, female   DOB: 21-Jun-1946, 68 y.o.   MRN: [id number]  HPI Chief Complaint  Patient presents with  . Knee Pain    Left knee pain x 2 months, no known injury.... ref-[facility]   This is a 68 year old female recently started on methotrexate for rheumatoid arthritis who presents with bilateral knee pain and pain radiating in her left leg she has a history of lumbar disc disease on x-ray has osteoarthritis as well on x-ray. Her x-rays perhaps even suggest rheumatoid changes in the knee. She complains of morning stiffness which gets better as she moves onto the day her pain level is 8/10 in the knee and left leg including both knees but no catching locking or giving way.  Review of Systems Review of systems has been recorded reviewed and signed and scanned into the chart  The past, family history and social history have been reviewed and are recorded in the corresponding sections of epic      Objective:   Physical Exam HISTORY BP 121/70  Ht 5\' 2"  (1.575 m)  Wt 169 lb 6.4 oz (76.839 kg)  BMI 30.98 kg/m2   ROS: Review of systems has been recorded reviewed and signed and scanned into the chart  PHYSICAL EXAM  General appearance good overall grooming and hygiene The patient is oriented to person place and time Mood and affect are normal The ambulatory status shows:   No assistive devices needed for ambulation she has valgus alignment to her lower chest remedy is at the knee joint  Today she has excellent range of motion in both knees and 130. Both knee joints are stable. Muscle tone is normal. Is no crepitance has mild joint line tenderness.  There is tenderness in the lumbar spine and lower thoracic spine. Inspection reveals no abnormalities and palpation reveals no tenderness I lateral dorsalis pedis pulses are intact there is no peripheral edema  Extremities are warm to touch Lymph nodes are normal without swelling Normal sensation is  noted throughout the extremities There are no pathologic reflexes Coordination is normal   Imaging as follows CLINICAL DATA:  Low back pain with left-sided radicular symptoms   EXAM: LUMBAR SPINE - COMPLETE 4+ VIEW   COMPARISON:  None.   FINDINGS: Frontal, lateral, spot lumbosacral lateral, and bilateral oblique views were obtained. There are 5 non-rib-bearing lumbar type vertebral bodies. There is no fracture or spondylolisthesis. There is mild disc space narrowing at L3-4, L4-5, and L5-S1. There is facet osteoarthritic change at L4-5 and L5-S1 bilaterally, more notable on the right than on the left. There are scattered foci of atherosclerotic calcification in the aorta.   IMPRESSION: Areas of osteoarthritic change.  No fracture or spo IMPRESSION: Slight arthritic changes of the medial and lateral compartments. Small joint effusion.       Assessment:     She has not had any significant NSAID treatment which I think would be helpful. At some point she should probably have some lumbar stabilization exercises.  Rheumatoid arthritis x-rays do not indicate surgical intervention at this point. Today she said she was feeling good perhaps from the methotrexate    Plan:     And diclofenac 50 twice a day follow up as needed

## 2014-07-22 NOTE — Patient Instructions (Signed)
Start  Meds ordered this encounter  Medications  . diclofenac (CATAFLAM) 50 MG tablet    Sig: Take 1 tablet (50 mg total) by mouth 2 (two) times daily.    Dispense:  90 tablet    Refill:  3

## 2014-08-30 ENCOUNTER — Telehealth: Payer: Self-pay | Admitting: Family Medicine

## 2014-08-30 NOTE — Telephone Encounter (Signed)
Noted  

## 2014-10-04 ENCOUNTER — Telehealth: Payer: Self-pay | Admitting: Family Medicine

## 2014-10-04 DIAGNOSIS — E559 Vitamin D deficiency, unspecified: Secondary | ICD-10-CM

## 2014-10-04 DIAGNOSIS — D649 Anemia, unspecified: Secondary | ICD-10-CM

## 2014-10-04 NOTE — Telephone Encounter (Signed)
Called patient and left message for them to return call at the office   

## 2014-10-04 NOTE — Telephone Encounter (Signed)
Pls attempt to contact this patient again. She needs the [place] before she takes any thyroid medication which  Am sure that she does need Pls explain my concern, she NEEDS the tests, will have more energy and feel better when thyroid is treated.Also offer flu vaccine, needs this Ask her to get [place] and labs ordered at July visit also add to those CBC, ands vit D please , send me a response which I will review

## 2014-10-07 NOTE — Telephone Encounter (Signed)
Called patient and left message for them to return call at the office   

## 2014-10-12 NOTE — Telephone Encounter (Signed)
Called patient and left message for them to return call at the office  Will mail letter for her to call

## 2014-10-22 ENCOUNTER — Other Ambulatory Visit: Payer: Self-pay

## 2014-10-22 DIAGNOSIS — E049 Nontoxic goiter, unspecified: Secondary | ICD-10-CM

## 2014-10-22 NOTE — Telephone Encounter (Signed)
Patient aware and mailed a new lab order

## 2014-10-22 NOTE — Addendum Note (Signed)
Addended by: Eual Fines on: 10/22/2014 09:59 AM   Modules accepted: Orders

## 2014-10-26 ENCOUNTER — Ambulatory Visit (HOSPITAL_COMMUNITY)
Admission: RE | Admit: 2014-10-26 | Discharge: 2014-10-26 | Disposition: A | Discharge status: Home / Self Care | Payer: Medicare HMO | Source: Ambulatory Visit | Attending: Family Medicine | Admitting: Family Medicine

## 2014-10-26 ENCOUNTER — Telehealth: Payer: Self-pay | Admitting: *Deleted

## 2014-10-26 DIAGNOSIS — E049 Nontoxic goiter, unspecified: Secondary | ICD-10-CM | POA: Diagnosis present

## 2014-10-26 DIAGNOSIS — R946 Abnormal results of thyroid function studies: Secondary | ICD-10-CM | POA: Insufficient documentation

## 2014-10-26 NOTE — Telephone Encounter (Signed)
[employer] from [employer] called stating the patient said the u/s of abd was supposed to be canceled and she is still scheduled for it, pt said she is not having abd pain anymore.

## 2014-10-29 ENCOUNTER — Ambulatory Visit (HOSPITAL_COMMUNITY): Admission: RE | Admit: 2014-10-29 | Payer: Medicare HMO | Source: Ambulatory Visit

## 2015-07-13 ENCOUNTER — Ambulatory Visit (INDEPENDENT_AMBULATORY_CARE_PROVIDER_SITE_OTHER): Payer: Medicare HMO | Admitting: Family Medicine

## 2015-07-13 ENCOUNTER — Telehealth: Payer: Self-pay

## 2015-07-13 ENCOUNTER — Encounter: Payer: Self-pay | Admitting: Family Medicine

## 2015-07-13 ENCOUNTER — Other Ambulatory Visit: Payer: Self-pay | Admitting: Family Medicine

## 2015-07-13 VITALS — BP 124/80 | HR 93 | Resp 16 | Ht 62.0 in | Wt 162.0 lb

## 2015-07-13 DIAGNOSIS — M5417 Radiculopathy, lumbosacral region: Secondary | ICD-10-CM | POA: Diagnosis not present

## 2015-07-13 DIAGNOSIS — R7302 Impaired glucose tolerance (oral): Secondary | ICD-10-CM

## 2015-07-13 DIAGNOSIS — D649 Anemia, unspecified: Secondary | ICD-10-CM | POA: Diagnosis not present

## 2015-07-13 DIAGNOSIS — E559 Vitamin D deficiency, unspecified: Secondary | ICD-10-CM | POA: Diagnosis not present

## 2015-07-13 DIAGNOSIS — Z0000 Encounter for general adult medical examination without abnormal findings: Secondary | ICD-10-CM

## 2015-07-13 DIAGNOSIS — Z87891 Personal history of nicotine dependence: Secondary | ICD-10-CM

## 2015-07-13 DIAGNOSIS — E039 Hypothyroidism, unspecified: Secondary | ICD-10-CM | POA: Diagnosis not present

## 2015-07-13 DIAGNOSIS — Z800 Family history of malignant neoplasm of digestive organs: Secondary | ICD-10-CM

## 2015-07-13 DIAGNOSIS — D126 Benign neoplasm of colon, unspecified: Secondary | ICD-10-CM

## 2015-07-13 DIAGNOSIS — E785 Hyperlipidemia, unspecified: Secondary | ICD-10-CM

## 2015-07-13 DIAGNOSIS — K148 Other diseases of tongue: Secondary | ICD-10-CM

## 2015-07-13 DIAGNOSIS — M5416 Radiculopathy, lumbar region: Secondary | ICD-10-CM

## 2015-07-13 DIAGNOSIS — Z1231 Encounter for screening mammogram for malignant neoplasm of breast: Secondary | ICD-10-CM

## 2015-07-13 DIAGNOSIS — R7989 Other specified abnormal findings of blood chemistry: Secondary | ICD-10-CM | POA: Diagnosis not present

## 2015-07-13 MED ORDER — IBUPROFEN 800 MG PO TABS
800.0000 mg | ORAL_TABLET | Freq: Three times a day (TID) | ORAL | Status: DC
Start: 2015-07-13 — End: 2017-05-22

## 2015-07-13 MED ORDER — MAGIC MOUTHWASH W/LIDOCAINE: 5.0000 mL | Freq: Four times a day (QID) | ORAL | Status: DC | PRN

## 2015-07-13 MED ORDER — GABAPENTIN 100 MG PO CAPS: 100.0000 mg | ORAL_CAPSULE | Freq: Three times a day (TID) | ORAL | Status: DC

## 2015-07-13 MED ORDER — METHYLPREDNISOLONE ACETATE 80 MG/ML IJ SUSP
80.0000 mg | Freq: Once | INTRAMUSCULAR | Status: AC
  Administered 2015-07-13: 80 mg via INTRAMUSCULAR

## 2015-07-13 MED ORDER — PREDNISONE 5 MG (21) PO TBPK: 5.0000 mg | ORAL_TABLET | Freq: Every day | ORAL | Status: DC

## 2015-07-13 MED ORDER — KETOROLAC TROMETHAMINE 60 MG/2ML IM SOLN
60.0000 mg | Freq: Once | INTRAMUSCULAR | Status: AC
  Administered 2015-07-13: 60 mg via INTRAMUSCULAR

## 2015-07-13 NOTE — Telephone Encounter (Signed)
Though she has rheumatoid arthritis , it is safer for her tower a seat belt and I encourage her to do this all the time, they are shown to save lives  A letter may be written stating that she does have a medical condition which limits flexibility of her fingers, only, explain to her that I am not stating that she is unable to use a seatbelt, in fact I RECOMMEND it

## 2015-07-13 NOTE — Progress Notes (Signed)
Subjective:    Patient ID: Bianca Mcguire, female    DOB: [birth year], 69 y.o.   MRN: [id number]  HPI  Preventive Screening-Counseling & Management   Patient present here today for a Medicare annual wellness visit. Also has c/o painful sores on her topngue, present for apst several weeks, has tried "everything" no relief Her mother had oral cancer, pt smoked on average  2 apcks cigarettes daily for over 2 0 years , quit smoking in 2015 C/o    Current Problems (verified)   Medications Prior to Visit Allergies (verified)   PAST HISTORY  Family History  Social History    Risk Factors  Current exercise habits:    Dietary issues discussed:   Cardiac risk factors:   Depression Screen  (Note: if answer to either of the following is "Yes", a more complete depression screening is indicated)   Over the past two weeks, have you felt down, depressed or hopeless? No  Over the past two weeks, have you felt little interest or pleasure in doing things? No  Have you lost interest or pleasure in daily life? No  Do you often feel hopeless? No  Do you cry easily over simple problems? No   Activities of Daily Living  In your present state of health, do you have any difficulty performing the following activities?  Driving?: No Managing money?: No Feeding yourself?:No Getting from bed to chair?:No Climbing a flight of stairs?:yes Preparing food and eating?:No Bathing or showering?:No Getting dressed?:No Getting to the toilet?:No Using the toilet?:No Moving around from place to place?: with flare of back pain uses a cane  Fall Risk Assessment In the past year have you fallen or had a near fall?:yes 1 near fall, grand daughter  was cause  Are you currently taking any medications that make you dizzy?:No   Hearing Difficulties: No Do you often ask people to speak up or repeat themselves?:No Do you experience ringing or noises in your ears?:No Do you have difficulty  understanding soft or whispered voices?:No  Cognitive Testing  Alert? Yes Normal Appearance?Yes  Oriented to person? Yes Place? Yes  Time? Yes  Displays appropriate judgment?Yes  Can read the correct time from a watch face? yes Are you having problems remembering things?No  Advanced Directives have been discussed with the patient?Yes , full code   List the Names of Other Physician/Practitioners you currently use: none   Indicate any recent Medical Services you may have received from other than [facility] providers in the past year (date may be approximate).   Assessment:    Annual Wellness Exam   Plan:      Medicare Attestation  I have personally reviewed:  The patient's medical and social history  Their use of alcohol, tobacco or illicit drugs  Their current medications and supplements  The patient's functional ability including ADLs,fall risks, home safety risks, cognitive, and hearing and visual impairment  Diet and physical activities  Evidence for depression or mood disorders  The patient's weight, height, BMI, and visual acuity have been recorded in the chart. I have made referrals, counseling, and provided education to the patient based on review of the above and I have provided the patient with a written personalized care plan for preventive services.     Review of Systems     Objective:   Physical Exam BP 124/80 mmHg  Pulse 93  Resp 16  Ht 5\' 2"  (1.575 m)  Wt 162 lb (73.483 kg)  BMI 29.62 kg/m2  SpO2 96%  HEENT: Ulcers x 3 noted on lateral aspect of tongue, largest approx 2.5 cm diameter No cervical adenopathy on exam No thyromegaly or palpable mass  MS: Decreased ROM lumbar spine with reduced power in right lower extremity, normal sensation throughout     Assessment & Plan:  Tongue lesion Painful ulcers on tongue, long h/o nicotine and reports being told she had a pre cancerous oral lesion at [facility] when  Fitting dentures Mother had oral cancer ENT  asap  Lumbar back pain with radiculopathy affecting right lower extremity Uncontrolled RLE pain with established back disease, injections in office , and start gabapentin titrating upward for chronic pain management  Adult hypothyroidism Non compliant with supplement recommended in 2015, will rept TSH and treat as indicated  Medicare annual wellness visit, subsequent Annual exam as documented. Counseling done  re healthy lifestyle involving commitment to 150 minutes exercise per week, heart healthy diet, and attaining healthy weight.The importance of adequate sleep also discussed. Regular seat belt use and home safety, is also discussed.  Immunization and cancer screening needs are specifically addressed at this visit. She is referred for mammogram and should be on lung cancer screenig with chest scan , low dose due to over 30 pack year history

## 2015-07-13 NOTE — Patient Instructions (Signed)
F/u in January, call if you need mme before  Call if you need me before please  Injections in office  and medication is sent in for leg pain New for management of pain due to nerve irritation is gabapentin 100mg .  Start one capsule at bedtime, after 3 to 5 days increase , if needed, to two capsules at bedtime, and after an additional 5 days to 3 capsules at bedtime if needed , for pain control.  Please note, the script is written as one three times daily, DO NOT take like this, instead take only at bedtime as above.   You are referred to ENT re sores on tongue and medication is sent in also  You are referred for a mammogram

## 2015-07-13 NOTE — Assessment & Plan Note (Signed)
Painful ulcers on tongue, long h/o nicotine and reports being told she had a pre cancerous oral lesion at [facility] when  Fitting dentures Mother had oral cancer ENT asap

## 2015-07-14 ENCOUNTER — Ambulatory Visit (INDEPENDENT_AMBULATORY_CARE_PROVIDER_SITE_OTHER): Payer: Commercial Managed Care - HMO | Admitting: Otolaryngology

## 2015-07-14 DIAGNOSIS — K1230 Oral mucositis (ulcerative), unspecified: Secondary | ICD-10-CM

## 2015-07-14 LAB — CBC WITH DIFFERENTIAL/PLATELET
Basophils Absolute: 0.1 10*3/uL (ref 0.0–0.1)
Basophils Relative: 1 % (ref 0–1)
EOS ABS: 0.2 10*3/uL (ref 0.0–0.7)
EOS PCT: 3 % (ref 0–5)
HCT: 35.9 % — ABNORMAL LOW (ref 36.0–46.0)
HEMOGLOBIN: 11.6 g/dL — AB (ref 12.0–15.0)
Lymphocytes Relative: 28 % (ref 12–46)
Lymphs Abs: 1.8 10*3/uL (ref 0.7–4.0)
MCH: 26.4 pg (ref 26.0–34.0)
MCHC: 32.3 g/dL (ref 30.0–36.0)
MCV: 81.8 fL (ref 78.0–100.0)
MONO ABS: 0.4 10*3/uL (ref 0.1–1.0)
MPV: 9.1 fL (ref 8.6–12.4)
Monocytes Relative: 6 % (ref 3–12)
Neutro Abs: 4 10*3/uL (ref 1.7–7.7)
Neutrophils Relative %: 62 % (ref 43–77)
Platelets: 237 10*3/uL (ref 150–400)
RBC: 4.39 MIL/uL (ref 3.87–5.11)
RDW: 15.1 % (ref 11.5–15.5)
WBC: 6.4 10*3/uL (ref 4.0–10.5)

## 2015-07-14 LAB — COMPREHENSIVE METABOLIC PANEL
ALK PHOS: 130 U/L (ref 33–130)
ALT: 11 U/L (ref 6–29)
AST: 18 U/L (ref 10–35)
Albumin: 3.6 g/dL (ref 3.6–5.1)
BILIRUBIN TOTAL: 0.3 mg/dL (ref 0.2–1.2)
BUN: 26 mg/dL — ABNORMAL HIGH (ref 7–25)
CALCIUM: 9 mg/dL (ref 8.6–10.4)
CHLORIDE: 107 mmol/L (ref 98–110)
CO2: 27 mmol/L (ref 20–31)
Creat: 0.71 mg/dL (ref 0.50–0.99)
GLUCOSE: 89 mg/dL (ref 65–99)
POTASSIUM: 4.1 mmol/L (ref 3.5–5.3)
Sodium: 143 mmol/L (ref 135–146)
Total Protein: 7.1 g/dL (ref 6.1–8.1)

## 2015-07-14 LAB — LIPID PANEL
CHOLESTEROL: 180 mg/dL (ref 125–200)
HDL: 54 mg/dL (ref >=46)
LDL Cholesterol: 102 mg/dL (ref <130)
Total CHOL/HDL Ratio: 3.3 Ratio (ref <=5.0)
Triglycerides: 119 mg/dL (ref <150)
VLDL: 24 mg/dL (ref <30)

## 2015-07-14 LAB — TSH: TSH: 8.347 u[IU]/mL — ABNORMAL HIGH (ref 0.350–4.500)

## 2015-07-14 LAB — VITAMIN D 25 HYDROXY (VIT D DEFICIENCY, FRACTURES): Vit D, 25-Hydroxy: 28 ng/mL — ABNORMAL LOW (ref 30–100)

## 2015-07-15 LAB — IRON: IRON: 27 ug/dL — AB (ref 42–145)

## 2015-07-15 LAB — FERRITIN: Ferritin: 149 ng/mL (ref 10–291)

## 2015-07-15 LAB — T3, FREE: T3, Free: 2.2 pg/mL — ABNORMAL LOW (ref 2.3–4.2)

## 2015-07-15 NOTE — Telephone Encounter (Signed)
Called and left voicemail notifying that letter will be typed and mailed but will only state that she has a medical condition that limits flexibility of fingers.

## 2015-07-17 ENCOUNTER — Other Ambulatory Visit: Payer: Self-pay | Admitting: Family Medicine

## 2015-07-17 ENCOUNTER — Telehealth: Payer: Self-pay | Admitting: Family Medicine

## 2015-07-17 DIAGNOSIS — Z87891 Personal history of nicotine dependence: Secondary | ICD-10-CM | POA: Insufficient documentation

## 2015-07-17 DIAGNOSIS — D509 Iron deficiency anemia, unspecified: Secondary | ICD-10-CM

## 2015-07-17 DIAGNOSIS — R195 Other fecal abnormalities: Secondary | ICD-10-CM

## 2015-07-17 DIAGNOSIS — D126 Benign neoplasm of colon, unspecified: Secondary | ICD-10-CM | POA: Insufficient documentation

## 2015-07-17 NOTE — Assessment & Plan Note (Signed)
colonoscopy past due needs to be re evaluated

## 2015-07-17 NOTE — Assessment & Plan Note (Signed)
Non compliant with supplement recommended in 2015, will rept TSH and treat as indicated

## 2015-07-17 NOTE — Assessment & Plan Note (Addendum)
Annual exam as documented. Counseling done  re healthy lifestyle involving commitment to 150 minutes exercise per week, heart healthy diet, and attaining healthy weight.The importance of adequate sleep also discussed. Regular seat belt use and home safety, is also discussed.  Immunization and cancer screening needs are specifically addressed at this visit. She is referred for mammogram and should be on lung cancer screenig with chest scan , low dose due to over 30 pack year history

## 2015-07-17 NOTE — Telephone Encounter (Signed)
I have referred pt for US abdomen to see if she has AAA ( aneurysm of aorta) this is indicated one time for medicare pts who have the long smoking history that she has , psl explain  To her.  ALSO she needs low dose chest scan screening formlung cancer with her smoke history, not sure she can egt to [facility] to go thru that system , check with [facility] , if not then she nedfs to be referred to our local radiology dept , she has an over 30 pack year history , document clearly in the tele call smoking history so you can use this when referring please  Any ??? pls ask  Thanks

## 2015-07-17 NOTE — Assessment & Plan Note (Signed)
Uncontrolled RLE pain with established back disease, injections in office , and start gabapentin titrating upward for chronic pain management

## 2015-07-19 ENCOUNTER — Telehealth: Payer: Self-pay | Admitting: Family Medicine

## 2015-07-19 ENCOUNTER — Encounter: Payer: Self-pay | Admitting: Gastroenterology

## 2015-07-19 NOTE — Telephone Encounter (Signed)
Noted  

## 2015-07-25 NOTE — Telephone Encounter (Signed)
Called patient and left message for them to return call at the office   

## 2015-07-27 ENCOUNTER — Ambulatory Visit (HOSPITAL_COMMUNITY): Payer: Medicare HMO

## 2015-07-28 ENCOUNTER — Ambulatory Visit (HOSPITAL_COMMUNITY): Admission: RE | Admit: 2015-07-28 | Payer: Medicare HMO | Source: Ambulatory Visit

## 2015-08-04 NOTE — Telephone Encounter (Signed)
Cannot get patient on the phone. Number keeps beeping like its out of order. Will mail letter

## 2015-08-09 ENCOUNTER — Ambulatory Visit: Payer: Medicare HMO | Admitting: Gastroenterology

## 2015-08-19 NOTE — Telephone Encounter (Signed)
Called patient and left message for them to return call at the office   

## 2015-09-05 NOTE — Telephone Encounter (Signed)
Have attempted to reach patient numerous times, mailed letter, still no response after weeks. Will mail certified letter in attempt to contact

## 2015-09-12 ENCOUNTER — Other Ambulatory Visit: Payer: Self-pay

## 2015-09-12 ENCOUNTER — Telehealth: Payer: Self-pay

## 2015-09-12 DIAGNOSIS — D649 Anemia, unspecified: Secondary | ICD-10-CM

## 2015-09-12 DIAGNOSIS — R195 Other fecal abnormalities: Secondary | ICD-10-CM

## 2015-09-12 DIAGNOSIS — E039 Hypothyroidism, unspecified: Secondary | ICD-10-CM

## 2015-09-12 MED ORDER — LEVOTHYROXINE SODIUM 25 MCG PO TABS: 25.0000 ug | ORAL_TABLET | Freq: Every day | ORAL | Status: DC

## 2015-09-13 ENCOUNTER — Encounter: Payer: Self-pay | Admitting: Gastroenterology

## 2015-09-15 NOTE — Telephone Encounter (Signed)
Sent patient info on screening recommendations and repeat lab order to have done in December.

## 2015-09-30 ENCOUNTER — Encounter: Payer: Self-pay | Admitting: Gastroenterology

## 2015-09-30 ENCOUNTER — Ambulatory Visit: Payer: Medicare HMO | Admitting: Gastroenterology

## 2015-09-30 ENCOUNTER — Telehealth: Payer: Self-pay | Admitting: Gastroenterology

## 2015-09-30 NOTE — Telephone Encounter (Signed)
PATIENT WAS A NO SHOW AND LETTER SENT  °

## 2015-11-25 DIAGNOSIS — M19049 Primary osteoarthritis, unspecified hand: Secondary | ICD-10-CM | POA: Diagnosis not present

## 2015-11-25 DIAGNOSIS — Z6832 Body mass index (BMI) 32.0-32.9, adult: Secondary | ICD-10-CM | POA: Diagnosis not present

## 2015-11-25 DIAGNOSIS — E669 Obesity, unspecified: Secondary | ICD-10-CM | POA: Diagnosis not present

## 2015-11-25 DIAGNOSIS — E039 Hypothyroidism, unspecified: Secondary | ICD-10-CM | POA: Diagnosis not present

## 2015-11-25 DIAGNOSIS — Z0000 Encounter for general adult medical examination without abnormal findings: Secondary | ICD-10-CM | POA: Diagnosis not present

## 2015-11-25 LAB — HM DIABETES EYE EXAM

## 2015-12-15 ENCOUNTER — Ambulatory Visit: Payer: Medicare HMO | Admitting: Family Medicine

## 2016-12-26 ENCOUNTER — Ambulatory Visit: Payer: Medicare HMO

## 2017-01-07 ENCOUNTER — Ambulatory Visit (INDEPENDENT_AMBULATORY_CARE_PROVIDER_SITE_OTHER): Payer: Medicare HMO

## 2017-01-07 VITALS — BP 118/62 | HR 78 | Temp 98.3°F | Ht 62.0 in | Wt 158.0 lb

## 2017-01-07 DIAGNOSIS — Z23 Encounter for immunization: Secondary | ICD-10-CM | POA: Diagnosis not present

## 2017-01-07 DIAGNOSIS — E039 Hypothyroidism, unspecified: Secondary | ICD-10-CM | POA: Diagnosis not present

## 2017-01-07 DIAGNOSIS — E559 Vitamin D deficiency, unspecified: Secondary | ICD-10-CM

## 2017-01-07 DIAGNOSIS — Z1239 Encounter for other screening for malignant neoplasm of breast: Secondary | ICD-10-CM

## 2017-01-07 DIAGNOSIS — M810 Age-related osteoporosis without current pathological fracture: Secondary | ICD-10-CM

## 2017-01-07 DIAGNOSIS — Z87891 Personal history of nicotine dependence: Secondary | ICD-10-CM

## 2017-01-07 DIAGNOSIS — E785 Hyperlipidemia, unspecified: Secondary | ICD-10-CM | POA: Diagnosis not present

## 2017-01-07 DIAGNOSIS — Z0000 Encounter for general adult medical examination without abnormal findings: Secondary | ICD-10-CM | POA: Diagnosis not present

## 2017-01-07 DIAGNOSIS — Z1211 Encounter for screening for malignant neoplasm of colon: Secondary | ICD-10-CM

## 2017-01-07 DIAGNOSIS — R7989 Other specified abnormal findings of blood chemistry: Secondary | ICD-10-CM

## 2017-01-07 DIAGNOSIS — Z1231 Encounter for screening mammogram for malignant neoplasm of breast: Secondary | ICD-10-CM

## 2017-01-07 DIAGNOSIS — Z1159 Encounter for screening for other viral diseases: Secondary | ICD-10-CM

## 2017-01-07 NOTE — Patient Instructions (Addendum)
Health maintenance:Flu shot administered today. You are due for a Mammogram, bone density, repeat colonoscopy, and a CT scan to screen for lung cancer. All of these have been ordered today, please call Forestine Na to schedule these.   Abnormal screenings: None   Patient concerns: None   Nurse concerns: Increase exercise to 30 minutes at least 3 times a week as tolerated   Next PCP appt: In 4 months with Dr. Moshe Cipro  Advance directive discussed with patient today. Copy provided for patient to complete at home and have notarized. Patient agrees to have copy sent to our office once it is complete.   Fall Prevention in the Home Introduction Falls can cause injuries. They can happen to people of all ages. There are many things you can do to make your home safe and to help prevent falls. What can I do on the outside of my home?  Regularly fix the edges of walkways and driveways and fix any cracks.  Remove anything that might make you trip as you walk through a door, such as a raised step or threshold.  Trim any bushes or trees on the path to your home.  Use bright outdoor lighting.  Clear any walking paths of anything that might make someone trip, such as rocks or tools.  Regularly check to see if handrails are loose or broken. Make sure that both sides of any steps have handrails.  Any raised decks and porches should have guardrails on the edges.  Have any leaves, snow, or ice cleared regularly.  Use sand or salt on walking paths during winter.  Clean up any spills in your garage right away. This includes oil or grease spills. What can I do in the bathroom?  Use night lights.  Install grab bars by the toilet and in the tub and shower. Do not use towel bars as grab bars.  Use non-skid mats or decals in the tub or shower.  If you need to sit down in the shower, use a plastic, non-slip stool.  Keep the floor dry. Clean up any water that spills on the floor as soon as it  happens.  Remove soap buildup in the tub or shower regularly.  Attach bath mats securely with double-sided non-slip rug tape.  Do not have throw rugs and other things on the floor that can make you trip. What can I do in the bedroom?  Use night lights.  Make sure that you have a light by your bed that is easy to reach.  Do not use any sheets or blankets that are too big for your bed. They should not hang down onto the floor.  Have a firm chair that has side arms. You can use this for support while you get dressed.  Do not have throw rugs and other things on the floor that can make you trip. What can I do in the kitchen?  Clean up any spills right away.  Avoid walking on wet floors.  Keep items that you use a lot in easy-to-reach places.  If you need to reach something above you, use a strong step stool that has a grab bar.  Keep electrical cords out of the way.  Do not use floor polish or wax that makes floors slippery. If you must use wax, use non-skid floor wax.  Do not have throw rugs and other things on the floor that can make you trip. What can I do with my stairs?  Do not leave any items  on the stairs.  Make sure that there are handrails on both sides of the stairs and use them. Fix handrails that are broken or loose. Make sure that handrails are as long as the stairways.  Check any carpeting to make sure that it is firmly attached to the stairs. Fix any carpet that is loose or worn.  Avoid having throw rugs at the top or bottom of the stairs. If you do have throw rugs, attach them to the floor with carpet tape.  Make sure that you have a light switch at the top of the stairs and the bottom of the stairs. If you do not have them, ask someone to add them for you. What else can I do to help prevent falls?  Wear shoes that:  Do not have high heels.  Have rubber bottoms.  Are comfortable and fit you well.  Are closed at the toe. Do not wear sandals.  If you  use a stepladder:  Make sure that it is fully opened. Do not climb a closed stepladder.  Make sure that both sides of the stepladder are locked into place.  Ask someone to hold it for you, if possible.  Clearly mark and make sure that you can see:  Any grab bars or handrails.  First and last steps.  Where the edge of each step is.  Use tools that help you move around (mobility aids) if they are needed. These include:  Canes.  Walkers.  Scooters.  Crutches.  Turn on the lights when you go into a dark area. Replace any light bulbs as soon as they burn out.  Set up your furniture so you have a clear path. Avoid moving your furniture around.  If any of your floors are uneven, fix them.  If there are any pets around you, be aware of where they are.  Review your medicines with your doctor. Some medicines can make you feel dizzy. This can increase your chance of falling. Ask your doctor what other things that you can do to help prevent falls. This information is not intended to replace advice given to you by your health care provider. Make sure you discuss any questions you have with your health care provider. Document Released: 09/22/2009 Document Revised: 05/03/2016 Document Reviewed: 12/31/2014  2017 [employer]

## 2017-01-07 NOTE — Progress Notes (Signed)
Subjective:   Bianca Mcguire is a 71 y.o. female who presents for Medicare Annual (Subsequent) preventive examination.  Review of Systems:  Cardiac Risk Factors include: advanced age (>31men, >75 women);dyslipidemia;sedentary lifestyle     Objective:     Vitals: BP 118/62   Pulse 78   Temp 98.3 F (36.8 C) (Oral)   Ht 5\' 2"  (1.575 m)   Wt 158 lb (71.7 kg)   SpO2 91%   BMI 28.90 kg/m   Body mass index is 28.9 kg/m.   Tobacco History  Smoking Status  . Former Smoker  . Packs/day: 2.00  . Years: 25.00  . Types: Cigarettes  . Quit date: 05/12/2014  Smokeless Tobacco  . Never Used     Counseling given: No   Past Medical History:  Diagnosis Date  . Carpal tunnel syndrome, bilateral   . Diverticulosis dx 2011  . Low back pain   . Obesity   . Rheumatoid arthritis(714.0)    Past Surgical History:  Procedure Laterality Date  . bilateral foot surgery    . [place]  . roophorectomy for benign disease  March 2011   Dr. Glo Herring  . tonsillectomy and adenoidctomy in childhood    . TUBAL LIGATION  1977   Family History  Problem Relation Age of Onset  . Heart failure Mother   . Cancer Mother 2    oral  . Heart failure Father   . Arthritis Sister   . Heart attack Brother   . Thyroid disease Daughter   . Hypertension Daughter   . Hypertension Daughter   . Thyroid disease Daughter   . Heart failure Sister   . Diabetes Sister   . Hypertension Sister   . Leukemia Brother    History  Sexual Activity  . Sexual activity: Not Currently    Outpatient Encounter Prescriptions as of 01/07/2017  Medication Sig  . acetaminophen (TYLENOL) 500 MG tablet One twice daily for arthritic pain (Patient taking differently: Take 1,000 mg by mouth every 4 (four) hours as needed for mild pain. One twice daily for arthritic pain)  . alendronate (FOSAMAX) 70 MG tablet Take 1 tablet (70 mg total) by mouth every 7 (seven) days. Take with a full glass of water on an  empty stomach.  . Alum & Mag Hydroxide-Simeth (MAGIC MOUTHWASH W/LIDOCAINE) SOLN Take 5 mLs by mouth 4 (four) times daily as needed for mouth pain.  Marland Kitchen aspirin (ASPIRIN LOW DOSE) 81 MG chewable tablet Chew 81 mg by mouth daily. Take one tablet by mouth daily   . gabapentin (NEURONTIN) 100 MG capsule Take 1 capsule (100 mg total) by mouth 3 (three) times daily.  Marland Kitchen ibuprofen (ADVIL,MOTRIN) 800 MG tablet Take 1 tablet (800 mg total) by mouth 3 (three) times daily.  . predniSONE (STERAPRED UNI-PAK 21 TAB) 5 MG (21) TBPK tablet Take 1 tablet (5 mg total) by mouth daily. Use as directed (Patient taking differently: Take 5 mg by mouth daily as needed. Use as directed)  . calcium-vitamin D (OSCAL 500/200 D-3) 500 MG tablet Take 1 tablet by mouth 2 (two) times daily. One tab by mouth three times a day   . levothyroxine (SYNTHROID, LEVOTHROID) 25 MCG tablet Take 1 tablet (25 mcg total) by mouth daily before breakfast. (Patient not taking: Reported on 01/07/2017)  . Multiple Vitamin (MULTIVITAMINS PO) Take by mouth daily. Take 1 tablet by mouth once a day    No facility-administered encounter medications on file as of 01/07/2017.  Activities of Daily Living In your present state of health, do you have any difficulty performing the following activities: 01/07/2017  Hearing? N  Vision? Y  Difficulty concentrating or making decisions? N  Walking or climbing stairs? N  Dressing or bathing? N  Doing errands, shopping? N  Preparing Food and eating ? N  Using the Toilet? N  In the past six months, have you accidently leaked urine? N  Do you have problems with loss of bowel control? N  Managing your Medications? N  Managing your Finances? N  Housekeeping or managing your Housekeeping? N  Some recent data might be hidden    Patient Care Team: Fayrene Helper, MD as PCP - [place], MD as Consulting Physician (Gastroenterology)    Assessment:    Exercise Activities and Dietary  recommendations Current Exercise Habits: The patient does not participate in regular exercise at present, Exercise limited by: None identified  Goals    . Exercise 3x per week (30 min per time)          Patient would like to start exercising 3 times a week for at least 30 minutes at a time.      Fall Risk Fall Risk  01/07/2017 07/13/2015 06/07/2014 04/08/2014  Falls in the past year? No No No No   Depression Screen PHQ 2/9 Scores 01/07/2017  PHQ - 2 Score 0     Cognitive Function    Normal 6CIT Screen 01/07/2017  What month? 0 points  What time? 0 points  Count back from 20 0 points  Months in reverse 0 points  Repeat phrase 0 points    Immunization History  Administered Date(s) Administered  . Influenza Whole 09/23/2008  . Influenza,inj,Quad PF,36+ Mos 08/28/2013, 01/07/2017  . PPD Test 08/28/2013  . Pneumococcal Conjugate-13 07/05/2014  . Pneumococcal Polysaccharide-23 09/07/2008, 09/15/2013   Screening Tests Health Maintenance  Topic Date Due  . Hepatitis C Screening  May 17, 1946  . COLONOSCOPY  05/31/2015  . MAMMOGRAM  09/19/2015  . ZOSTAVAX  03/08/2017 (Originally 07/24/2006)  . TETANUS/TDAP  03/08/2017 (Originally 07/24/1965)  . INFLUENZA VACCINE  Completed  . DEXA SCAN  Completed  . PNA vac Low Risk Adult  Completed      Plan:  I have personally reviewed and addressed the Medicare Annual Wellness questionnaire and have noted the following in the patient's chart:  A. Medical and social history B. Use of alcohol, tobacco or illicit drugs  C. Current medications and supplements D. Functional ability and status E.  Nutritional status F.  Physical activity G. Advance directives discussed today H. List of other physicians I.  Hospitalizations, surgeries, and ER visits in previous 12 months J.  [place] to include hearing, vision, cognitive, depression L. Referrals and appointments - Colonoscopy, Mammogram, DEXA, and CT chest scan ordered today.  In  addition, I have reviewed and discussed with patient certain preventive protocols, quality metrics, and best practice recommendations. A written personalized care plan for preventive services as well as general preventive health recommendations were provided to patient.  Signed,   Stormy Fabian, LPN Lead Nurse Health Advisor

## 2017-01-12 DIAGNOSIS — Z23 Encounter for immunization: Secondary | ICD-10-CM | POA: Insufficient documentation

## 2017-01-12 NOTE — Assessment & Plan Note (Signed)
After informed consent flu vaccine administered by nurse

## 2017-01-18 ENCOUNTER — Ambulatory Visit (HOSPITAL_COMMUNITY): Payer: Medicare HMO

## 2017-04-29 ENCOUNTER — Ambulatory Visit: Payer: Medicare HMO | Admitting: Family Medicine

## 2017-05-22 ENCOUNTER — Encounter: Payer: Self-pay | Admitting: Family Medicine

## 2017-05-22 ENCOUNTER — Ambulatory Visit (INDEPENDENT_AMBULATORY_CARE_PROVIDER_SITE_OTHER): Payer: Medicare HMO | Admitting: Family Medicine

## 2017-05-22 VITALS — BP 150/82 | HR 93 | Resp 16 | Ht 62.0 in | Wt 161.0 lb

## 2017-05-22 DIAGNOSIS — M25512 Pain in left shoulder: Secondary | ICD-10-CM | POA: Diagnosis not present

## 2017-05-22 DIAGNOSIS — E559 Vitamin D deficiency, unspecified: Secondary | ICD-10-CM | POA: Diagnosis not present

## 2017-05-22 DIAGNOSIS — M25561 Pain in right knee: Secondary | ICD-10-CM | POA: Diagnosis not present

## 2017-05-22 DIAGNOSIS — G8929 Other chronic pain: Secondary | ICD-10-CM

## 2017-05-22 DIAGNOSIS — E785 Hyperlipidemia, unspecified: Secondary | ICD-10-CM | POA: Diagnosis not present

## 2017-05-22 DIAGNOSIS — E039 Hypothyroidism, unspecified: Secondary | ICD-10-CM | POA: Diagnosis not present

## 2017-05-22 DIAGNOSIS — M818 Other osteoporosis without current pathological fracture: Secondary | ICD-10-CM | POA: Diagnosis not present

## 2017-05-22 MED ORDER — RANITIDINE HCL 300 MG PO TABS: 300.0000 mg | ORAL_TABLET | Freq: Every day | ORAL | 0 refills | Status: DC

## 2017-05-22 MED ORDER — KETOROLAC TROMETHAMINE 60 MG/2ML IM SOLN
60.0000 mg | Freq: Once | INTRAMUSCULAR | Status: AC
  Administered 2017-05-23: 60 mg via INTRAMUSCULAR

## 2017-05-22 MED ORDER — METHYLPREDNISOLONE ACETATE 80 MG/ML IJ SUSP
80.0000 mg | Freq: Once | INTRAMUSCULAR | Status: AC
  Administered 2017-05-23: 80 mg via INTRAMUSCULAR

## 2017-05-22 MED ORDER — PREDNISONE 5 MG PO TABS: 5.0000 mg | ORAL_TABLET | Freq: Two times a day (BID) | ORAL | 0 refills | Status: AC

## 2017-05-22 MED ORDER — ALENDRONATE SODIUM 70 MG PO TABS: 70.0000 mg | ORAL_TABLET | ORAL | 11 refills | Status: DC

## 2017-05-22 MED ORDER — ASPIRIN EC 81 MG PO TBEC: 81.0000 mg | DELAYED_RELEASE_TABLET | Freq: Every day | ORAL | 3 refills | Status: DC

## 2017-05-22 MED ORDER — CALCIUM CARBONATE-VITAMIN D 500-400 MG-UNIT PO TABS: 1.0000 | ORAL_TABLET | Freq: Two times a day (BID) | ORAL | 11 refills | Status: DC

## 2017-05-22 MED ORDER — NAPROXEN 375 MG PO TABS: 375.0000 mg | ORAL_TABLET | Freq: Two times a day (BID) | ORAL | 0 refills | Status: DC

## 2017-05-22 NOTE — Assessment & Plan Note (Signed)
Mobility and debilitating refer ortho

## 2017-05-22 NOTE — Patient Instructions (Signed)
Annual physical  Exam in 6 weeks , call if you need me before  Keep mammogram appt  Labs today  Injections today for pain,  Medications sent for pain  Take medications prescribed regularly fo osteoporosis  You are referred to Dr Aline Brochure re right knee and left shoulder   Careful not to fall  Thanks for choosing [facility], we consider it a privelige to serve you.

## 2017-05-22 NOTE — Assessment & Plan Note (Signed)
Pain swelling and instability, refer to ortho Uncontrolled.Toradol and depo medrol administered IM in the office , to be followed by a short course of oral prednisone and NSAIDS.

## 2017-05-23 DIAGNOSIS — E559 Vitamin D deficiency, unspecified: Secondary | ICD-10-CM | POA: Diagnosis not present

## 2017-05-23 DIAGNOSIS — M818 Other osteoporosis without current pathological fracture: Secondary | ICD-10-CM | POA: Diagnosis not present

## 2017-05-23 DIAGNOSIS — E039 Hypothyroidism, unspecified: Secondary | ICD-10-CM | POA: Diagnosis not present

## 2017-05-23 DIAGNOSIS — M25512 Pain in left shoulder: Secondary | ICD-10-CM | POA: Diagnosis not present

## 2017-05-23 DIAGNOSIS — M25561 Pain in right knee: Secondary | ICD-10-CM | POA: Diagnosis not present

## 2017-05-23 DIAGNOSIS — G8929 Other chronic pain: Secondary | ICD-10-CM | POA: Diagnosis not present

## 2017-05-23 DIAGNOSIS — E785 Hyperlipidemia, unspecified: Secondary | ICD-10-CM | POA: Diagnosis not present

## 2017-05-24 ENCOUNTER — Other Ambulatory Visit: Payer: Self-pay

## 2017-05-24 DIAGNOSIS — Z1159 Encounter for screening for other viral diseases: Secondary | ICD-10-CM

## 2017-05-24 NOTE — Assessment & Plan Note (Signed)
Needs to commit to definitive treatment, medication sent in and need to commit to taking faithfully explained to pt

## 2017-05-24 NOTE — Assessment & Plan Note (Signed)
disabloing pain interfering with sleep and quality of life, poor functin , needs ortho management Uncontrolled.Toradol and depo medrol administered IM in the office , to be followed by a short course of oral prednisone and NSAIDS.

## 2017-05-24 NOTE — Assessment & Plan Note (Signed)
Hyperlipidemia:Low fat diet discussed and encouraged.   Lipid Panel  Lab Results  Component Value Date   CHOL 180 07/13/2015   HDL 54 07/13/2015   LDLCALC 102 07/13/2015   TRIG 119 07/13/2015   CHOLHDL 3.3 07/13/2015   Updated lab needed

## 2017-05-24 NOTE — Progress Notes (Signed)
   Bianca Mcguire     MRN: [id number]      DOB: Apr 17, 1946   HPI Bianca Mcguire is here for follow up and re-evaluation of chronic medical conditions and  medication management  She still needs updated labs, most recent labs are over 71 years old.  Preventive health is still top be  updated, specifically  Cancer screening and Immunization.  States she intends to do all of this Made this appt because of increased and uncontrolled right knee and left shoulder pain, both joints with limited mobility and impairing function, feels knee buckling at times and uses a cane to prevent falls, sleep is disturbed also  ROS Denies recent fever or chills. Denies sinus pressure, nasal congestion, ear pain or sore throat. Denies chest congestion, productive cough or wheezing. Denies chest pains, palpitations and leg swelling Denies abdominal pain, nausea, vomiting,diarrhea or constipation.   Denies dysuria, frequency, hesitancy or incontinence.  Denies headaches, seizures, numbness, or tingling. Denies skin break down or rash.   PE  BP (!) 150/82   Pulse 93   Resp 16   Ht 5\' 2"  (1.575 m)   Wt 161 lb (73 kg)   SpO2 94%   BMI 29.45 kg/m   Patient alert and oriented and in no cardiopulmonary distress. Pt in obvious pain HEENT: No facial asymmetry, EOMI,   oropharynx pink and moist.  Neck supple no JVD, no mass.  Chest: Clear to auscultation bilaterally.  CVS: S1, S2 no murmurs, no S3.Regular rate.  ABD: Soft non tender.   Ext: No edema  MS: decreased ROM spie and markedly reduced ROM right knee and   left shoulder, Deformity , swelling and crepitus of right knee.  Skin: Intact, no ulcerations or rash noted.  Psych: Good eye contact, normal affect. Memory intact not anxious or depressed appearing.  CNS: CN 2-12 intact, power,  normal throughout.no focal deficits noted.   Assessment & Plan  Knee pain, right Pain swelling and instability, refer to ortho Uncontrolled.Toradol and depo  medrol administered IM in the office , to be followed by a short course of oral prednisone and NSAIDS.   SHOULDER PAIN, LEFT Mobility and debilitating refer ortho  Shoulder pain, left disabloing pain interfering with sleep and quality of life, poor functin , needs ortho management Uncontrolled.Toradol and depo medrol administered IM in the office , to be followed by a short course of oral prednisone and NSAIDS.   Hyperlipidemia Hyperlipidemia:Low fat diet discussed and encouraged.   Lipid Panel  Lab Results  Component Value Date   CHOL 180 07/13/2015   HDL 54 07/13/2015   LDLCALC 102 07/13/2015   TRIG 119 07/13/2015   CHOLHDL 3.3 07/13/2015   Updated lab needed    Osteoporosis Needs to commit to definitive treatment, medication sent in and need to commit to taking faithfully explained to pt

## 2017-05-29 ENCOUNTER — Ambulatory Visit (INDEPENDENT_AMBULATORY_CARE_PROVIDER_SITE_OTHER): Payer: Medicare HMO | Admitting: Podiatry

## 2017-05-29 ENCOUNTER — Encounter: Payer: Self-pay | Admitting: Podiatry

## 2017-05-29 VITALS — BP 146/83 | HR 76 | Resp 16 | Ht 62.0 in | Wt 161.0 lb

## 2017-05-29 DIAGNOSIS — L600 Ingrowing nail: Secondary | ICD-10-CM | POA: Diagnosis not present

## 2017-05-29 NOTE — Progress Notes (Signed)
Subjective:    Patient ID: Bianca Mcguire, female   DOB: 71 y.o.   MRN: [id number]   HPI patient presents stating she has a severely thickened dystrophic second nail left it's painful when pressed from a dorsal direction and states that it makes shoe gear difficult    Review of Systems  All other systems reviewed and are negative.       Objective:  Physical Exam  Constitutional: She is oriented to person, place, and time.  Cardiovascular: Intact distal pulses.   Musculoskeletal: Normal range of motion.  Neurological: She is alert and oriented to person, place, and time.  Skin: Skin is warm.  Nursing note and vitals reviewed.  neurovascular status found to be intact muscle strength adequate range of motion within normal limits with patient found to have a severely thickened second nail left that's painful when pressed and malpositioned. Patient's found have good digital perfusion well oriented 3     Assessment:    Damaged left second nail that's painful when palpated with chronic trauma     Plan:    H&P condition reviewed and discussed nail removal. Patient wants surgery and I explained procedure and risk and today the left hallux is infiltrated 60 mg Xylocaine Marcaine mixture I removed the second nail exposed matrix and applied phenol 3 applications 30 seconds followed by alcohol lavaged sterile dressing. Gave instructions on soaks and reappoint

## 2017-05-29 NOTE — Patient Instructions (Signed)

## 2017-05-29 NOTE — Progress Notes (Signed)
   Subjective:    Patient ID: Bianca Mcguire, female    DOB: 1946/05/27, 71 y.o.   MRN: [id number]  HPI Chief Complaint  Patient presents with  . Nail Problem    Left foot; 2nd toe; nail discoloration & thickened nail; pt stated, "Toe feels tender when something touches it"; x6 months      Review of Systems  HENT: Positive for sinus pain.   Cardiovascular: Positive for leg swelling.  Musculoskeletal: Positive for arthralgias and gait problem.  All other systems reviewed and are negative.      Objective:   Physical Exam        Assessment & Plan:

## 2017-06-05 ENCOUNTER — Ambulatory Visit (HOSPITAL_COMMUNITY): Payer: Medicare HMO

## 2017-06-05 ENCOUNTER — Other Ambulatory Visit: Payer: Self-pay | Admitting: Family Medicine

## 2017-06-05 ENCOUNTER — Ambulatory Visit (HOSPITAL_COMMUNITY)
Admission: RE | Admit: 2017-06-05 | Discharge: 2017-06-05 | Disposition: A | Discharge status: Home / Self Care | Payer: Medicare HMO | Source: Ambulatory Visit | Attending: Family Medicine | Admitting: Family Medicine

## 2017-06-05 DIAGNOSIS — Z1231 Encounter for screening mammogram for malignant neoplasm of breast: Secondary | ICD-10-CM | POA: Diagnosis not present

## 2017-06-05 DIAGNOSIS — E039 Hypothyroidism, unspecified: Secondary | ICD-10-CM | POA: Diagnosis not present

## 2017-06-05 DIAGNOSIS — M25512 Pain in left shoulder: Secondary | ICD-10-CM | POA: Diagnosis not present

## 2017-06-05 DIAGNOSIS — G8929 Other chronic pain: Secondary | ICD-10-CM | POA: Diagnosis not present

## 2017-06-05 DIAGNOSIS — R928 Other abnormal and inconclusive findings on diagnostic imaging of breast: Secondary | ICD-10-CM | POA: Diagnosis not present

## 2017-06-05 DIAGNOSIS — N6310 Unspecified lump in the right breast, unspecified quadrant: Secondary | ICD-10-CM

## 2017-06-05 DIAGNOSIS — E785 Hyperlipidemia, unspecified: Secondary | ICD-10-CM | POA: Diagnosis not present

## 2017-06-05 DIAGNOSIS — E559 Vitamin D deficiency, unspecified: Secondary | ICD-10-CM | POA: Diagnosis not present

## 2017-06-05 LAB — COMPREHENSIVE METABOLIC PANEL
ALBUMIN: 3.8 g/dL (ref 3.6–5.1)
ALT: 8 U/L (ref 6–29)
AST: 16 U/L (ref 10–35)
Alkaline Phosphatase: 137 U/L — ABNORMAL HIGH (ref 33–130)
BUN: 26 mg/dL — ABNORMAL HIGH (ref 7–25)
CALCIUM: 9.2 mg/dL (ref 8.6–10.4)
CHLORIDE: 109 mmol/L (ref 98–110)
CO2: 24 mmol/L (ref 20–31)
Creat: 0.85 mg/dL (ref 0.60–0.93)
Glucose, Bld: 96 mg/dL (ref 65–99)
POTASSIUM: 4.3 mmol/L (ref 3.5–5.3)
Sodium: 142 mmol/L (ref 135–146)
TOTAL PROTEIN: 6.7 g/dL (ref 6.1–8.1)
Total Bilirubin: 0.3 mg/dL (ref 0.2–1.2)

## 2017-06-05 LAB — TSH: TSH: 10.52 m[IU]/L — AB

## 2017-06-05 LAB — LIPID PANEL
CHOLESTEROL: 188 mg/dL (ref <200)
HDL: 61 mg/dL (ref >50)
LDL Cholesterol: 108 mg/dL — ABNORMAL HIGH (ref <100)
Total CHOL/HDL Ratio: 3.1 Ratio (ref <5.0)
Triglycerides: 93 mg/dL (ref <150)
VLDL: 19 mg/dL (ref <30)

## 2017-06-05 LAB — CBC
HCT: 37 % (ref 35.0–45.0)
HEMOGLOBIN: 11.6 g/dL — AB (ref 11.7–15.5)
MCH: 25 pg — AB (ref 27.0–33.0)
MCHC: 31.4 g/dL — AB (ref 32.0–36.0)
MCV: 79.7 fL — ABNORMAL LOW (ref 80.0–100.0)
MPV: 9 fL (ref 7.5–12.5)
PLATELETS: 193 10*3/uL (ref 140–400)
RBC: 4.64 MIL/uL (ref 3.80–5.10)
RDW: 15.6 % — ABNORMAL HIGH (ref 11.0–15.0)
WBC: 6 10*3/uL (ref 3.8–10.8)

## 2017-06-05 LAB — HEPATITIS C ANTIBODY: HCV Ab: NEGATIVE

## 2017-06-06 LAB — VITAMIN D 25 HYDROXY (VIT D DEFICIENCY, FRACTURES): Vit D, 25-Hydroxy: 27 ng/mL — ABNORMAL LOW (ref 30–100)

## 2017-06-07 LAB — T4, FREE
Free T4: 0.8 ng/dL (ref 0.8–1.8)
Free T4: 0.8 ng/dL (ref 0.8–1.8)
Free T4: 0.8 ng/dL (ref 0.8–1.8)

## 2017-06-07 LAB — IRON,TIBC AND FERRITIN PANEL
%SAT: 7 % — ABNORMAL LOW (ref 11–50)
%SAT: 9 % — AB (ref 11–50)
FERRITIN: 55 ng/mL (ref 20–288)
FERRITIN: 57 ng/mL (ref 20–288)
Ferritin: 58 ng/mL (ref 20–288)
IRON: 26 ug/dL — AB (ref 45–160)
Iron: 31 ug/dL — ABNORMAL LOW (ref 45–160)
TIBC: 361 ug/dL (ref 250–450)
TIBC: 360 ug/dL (ref 250–450)

## 2017-06-07 LAB — T3, FREE
T3 FREE: 2.7 pg/mL (ref 2.3–4.2)
T3 FREE: 2.8 pg/mL (ref 2.3–4.2)
T3, Free: 2.8 pg/mL (ref 2.3–4.2)

## 2017-06-07 LAB — B12 AND FOLATE PANEL
Folate: 6.2 ng/mL (ref >5.4)
Folate: 7.1 ng/mL (ref >5.4)
Folate: 7.4 ng/mL (ref >5.4)
VITAMIN B 12: 194 pg/mL — AB (ref 200–1100)
VITAMIN B 12: 199 pg/mL — AB (ref 200–1100)
VITAMIN B 12: 200 pg/mL (ref 200–1100)

## 2017-06-11 ENCOUNTER — Ambulatory Visit (HOSPITAL_COMMUNITY)
Admission: RE | Admit: 2017-06-11 | Discharge: 2017-06-11 | Disposition: A | Discharge status: Home / Self Care | Payer: Medicare HMO | Source: Ambulatory Visit | Attending: Family Medicine | Admitting: Family Medicine

## 2017-06-11 DIAGNOSIS — N6310 Unspecified lump in the right breast, unspecified quadrant: Secondary | ICD-10-CM

## 2017-06-11 DIAGNOSIS — R928 Other abnormal and inconclusive findings on diagnostic imaging of breast: Secondary | ICD-10-CM | POA: Diagnosis not present

## 2017-06-16 ENCOUNTER — Other Ambulatory Visit: Payer: Self-pay | Admitting: Family Medicine

## 2017-06-16 DIAGNOSIS — D509 Iron deficiency anemia, unspecified: Secondary | ICD-10-CM

## 2017-06-16 DIAGNOSIS — R7989 Other specified abnormal findings of blood chemistry: Secondary | ICD-10-CM

## 2017-06-16 DIAGNOSIS — D126 Benign neoplasm of colon, unspecified: Secondary | ICD-10-CM

## 2017-06-16 NOTE — Progress Notes (Signed)
amb gastro  

## 2017-06-17 ENCOUNTER — Telehealth: Payer: Self-pay | Admitting: Family Medicine

## 2017-06-17 NOTE — Telephone Encounter (Signed)
-----   Message from Fayrene Helper, MD sent at 06/16/2017  4:55 AM EDT ----- Please contact this patient about her labs.  ( she just had her mammogram completed , and I was waiting on that to be done) Explain that I am concerned that she is iron and Vit B12  deficient and that her thyroid function test suggests that thyroid glans is underactive I DO recommend she go ahead and have her rept colonoscopy scheduled with Dr Sydell Axon, last was in 2011 , she had one polyp, I also recommend starting  OTC iron 325 mg one tablet  twice daily, andOTC vit B12 one daily  I also recommend a repeat ultrasound of her thyroid gland , due to abnormal blood test, to make sure she has no  Abnormal nodules/ in the gland, she did havew an ultrasound several years ago which was normal, but does ned another one  I have entered both referrals , pls let me know if she refuses either , thanks  ?? Pls ask

## 2017-06-17 NOTE — Telephone Encounter (Signed)
Called patient regarding message below. No answer, left generic message for patient to return call.   

## 2017-06-18 NOTE — Telephone Encounter (Signed)
Called patient regarding message below. No answer, left generic message for patient to return call.   

## 2017-06-19 ENCOUNTER — Encounter: Payer: Self-pay | Admitting: Gastroenterology

## 2017-06-20 NOTE — Telephone Encounter (Signed)
Patient informed of message below, verbalized understanding.  

## 2017-06-25 ENCOUNTER — Ambulatory Visit (HOSPITAL_COMMUNITY)
Admission: RE | Admit: 2017-06-25 | Discharge: 2017-06-25 | Disposition: A | Discharge status: Home / Self Care | Payer: Medicare HMO | Source: Ambulatory Visit | Attending: Family Medicine | Admitting: Family Medicine

## 2017-06-25 DIAGNOSIS — E049 Nontoxic goiter, unspecified: Secondary | ICD-10-CM | POA: Diagnosis not present

## 2017-06-25 DIAGNOSIS — R7989 Other specified abnormal findings of blood chemistry: Secondary | ICD-10-CM

## 2017-06-25 DIAGNOSIS — R946 Abnormal results of thyroid function studies: Secondary | ICD-10-CM | POA: Insufficient documentation

## 2017-06-25 DIAGNOSIS — E041 Nontoxic single thyroid nodule: Secondary | ICD-10-CM | POA: Insufficient documentation

## 2017-07-24 ENCOUNTER — Ambulatory Visit (INDEPENDENT_AMBULATORY_CARE_PROVIDER_SITE_OTHER): Payer: Medicare HMO

## 2017-07-24 ENCOUNTER — Ambulatory Visit (INDEPENDENT_AMBULATORY_CARE_PROVIDER_SITE_OTHER): Payer: Medicare HMO | Admitting: Orthopaedic Surgery

## 2017-07-24 ENCOUNTER — Encounter: Payer: Self-pay | Admitting: Orthopaedic Surgery

## 2017-07-24 VITALS — BP 175/77 | HR 71 | Temp 97.6°F | Ht 62.0 in | Wt 160.0 lb

## 2017-07-24 DIAGNOSIS — M25512 Pain in left shoulder: Secondary | ICD-10-CM

## 2017-07-24 MED ORDER — NAPROXEN 500 MG PO TABS: 500.0000 mg | ORAL_TABLET | Freq: Two times a day (BID) | ORAL | 5 refills | Status: DC

## 2017-07-24 NOTE — Progress Notes (Signed)
Subjective:    Patient ID: Bianca Mcguire, female    DOB: 01/27/1946, 71 y.o.   MRN: [id number]  HPI She has four to five month history of left shoulder pain.  She has no trauma. She has no numbness, no redness, no weakness. She has pain rolling over on it at night.  She has tried Advil, heat, ice and rubs with little help. She saw her family doctor and is referred here.  She has pain when trying to lift her hand over her head.   Review of Systems  HENT: Negative for congestion.   Respiratory: Negative for cough and shortness of breath.   Cardiovascular: Negative for chest pain and leg swelling.  Endocrine: Positive for cold intolerance.  Musculoskeletal: Positive for arthralgias and back pain.  Allergic/Immunologic: Positive for environmental allergies.   Past Medical History:  Diagnosis Date  . Carpal tunnel syndrome, bilateral   . Diverticulosis dx 2011  . Low back pain   . Obesity   . Rheumatoid arthritis(714.0)     Past Surgical History:  Procedure Laterality Date  . bilateral foot surgery    . [place]  . roophorectomy for benign disease  March 2011   Dr. Glo Herring  . tonsillectomy and adenoidctomy in childhood    . TUBAL LIGATION  1977    Current Outpatient Prescriptions on File Prior to Visit  Medication Sig Dispense Refill  . acetaminophen (TYLENOL) 500 MG tablet One twice daily for arthritic pain (Patient taking differently: Take 1,000 mg by mouth every 4 (four) hours as needed for mild pain. One twice daily for arthritic pain) 60 tablet 2  . alendronate (FOSAMAX) 70 MG tablet Take 1 tablet (70 mg total) by mouth every 7 (seven) days. Take with a full glass of water on an empty stomach. 4 tablet 11  . aspirin EC 81 MG tablet Take 1 tablet (81 mg total) by mouth daily. 100 tablet 3  . calcium-vitamin D (OSCAL-500) 500-400 MG-UNIT tablet Take 1 tablet by mouth 2 (two) times daily. 60 tablet 11  . naproxen (NAPROSYN) 375 MG tablet Take 1 tablet (375  mg total) by mouth 2 (two) times daily with a meal. 10 tablet 0  . ranitidine (ZANTAC) 300 MG tablet Take 1 tablet (300 mg total) by mouth at bedtime. 14 tablet 0   No current facility-administered medications on file prior to visit.     Social History   Social History  . Marital status: Widowed    Spouse name: N/A  . Number of children: 3  . Years of education: N/A   Occupational History  . 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 disabled for arthritis      since age 47  .  [employer]   Social History Main Topics  . Smoking status: Former Smoker    Packs/day: 2.00    Years: 25.00    Types: Cigarettes    Quit date: 05/12/2014  . Smokeless tobacco: Never Used  . Alcohol use No  . Drug use: No  . Sexual activity: Not Currently   Other Topics Concern  . Not on file   Social History Narrative   Lives with Granddaughter since 2006    Family History  Problem Relation Age of Onset  . Heart failure Mother   . Cancer Mother 12       oral  . Heart failure Father   . Arthritis Sister   .  Heart attack Brother   . Thyroid disease Daughter   . Hypertension Daughter   . Hypertension Daughter   . Thyroid disease Daughter   . Heart failure Sister   . Diabetes Sister   . Hypertension Sister   . Leukemia Brother     BP (!) 175/77   Pulse 71   Temp 97.6 F (36.4 C)   Ht 5\' 2"  (1.575 m)   Wt 160 lb (72.6 kg)   BMI 29.26 kg/m      Objective:   Physical Exam  Constitutional: She is oriented to person, place, and time. She appears well-developed and well-nourished.  HENT:  Head: Normocephalic and atraumatic.  Eyes: Pupils are equal, round, and reactive to light. Conjunctivae and EOM are normal.  Neck: Normal range of motion. Neck supple.  Cardiovascular: Normal rate, regular rhythm and intact distal pulses.   Pulmonary/Chest: Effort normal.  Abdominal: Soft.  Musculoskeletal: She exhibits tenderness (Left shoulder pain, forward 165, abduction 145, internal  30, external 35, extension 15, adduction full.  NV intact.  No swelling or redness.  Right shoulder normal.  Neck normal.).  Neurological: She is alert and oriented to person, place, and time. She displays normal reflexes. No cranial nerve deficit. She exhibits normal muscle tone. Coordination normal.  Skin: Skin is warm and dry.  Psychiatric: She has a normal mood and affect. Her behavior is normal. Judgment and thought content normal.    X-rays were done of the left shoulder, reported separately.      Assessment & Plan:   Encounter Diagnosis  Name Primary?  . Pain in joint of left shoulder Yes   PROCEDURE NOTE:  The patient request injection, verbal consent was obtained.  The left shoulder was prepped appropriately after time out was performed.   Sterile technique was observed and injection of 1 cc of Depo-Medrol 40 mg with several cc's of plain xylocaine. Anesthesia was provided by ethyl chloride and a 20-gauge needle was used to inject the shoulder area. A posterior approach was used.  The injection was tolerated well.  A band aid dressing was applied.  The patient was advised to apply ice later today and tomorrow to the injection sight as needed.  Rx for Naprosyn given.  Stop her Advil.  Precautions discussed.  Return in two weeks.  Call if any problem.  Precautions discussed.     Electronically Signed Sanjuana Kava, MD 8/15/20189:52 AM

## 2017-07-25 ENCOUNTER — Ambulatory Visit (INDEPENDENT_AMBULATORY_CARE_PROVIDER_SITE_OTHER): Payer: Medicare HMO | Admitting: Family Medicine

## 2017-07-25 ENCOUNTER — Encounter: Payer: Self-pay | Admitting: Family Medicine

## 2017-07-25 ENCOUNTER — Other Ambulatory Visit (HOSPITAL_COMMUNITY)
Admission: RE | Admit: 2017-07-25 | Discharge: 2017-07-25 | Disposition: A | Discharge status: Home / Self Care | Payer: Medicare HMO | Source: Ambulatory Visit | Attending: Family Medicine | Admitting: Family Medicine

## 2017-07-25 VITALS — BP 140/82 | HR 70 | Resp 16 | Ht 62.0 in | Wt 160.1 lb

## 2017-07-25 DIAGNOSIS — Z124 Encounter for screening for malignant neoplasm of cervix: Secondary | ICD-10-CM

## 2017-07-25 DIAGNOSIS — E039 Hypothyroidism, unspecified: Secondary | ICD-10-CM

## 2017-07-25 DIAGNOSIS — E538 Deficiency of other specified B group vitamins: Secondary | ICD-10-CM | POA: Diagnosis not present

## 2017-07-25 DIAGNOSIS — Z0000 Encounter for general adult medical examination without abnormal findings: Secondary | ICD-10-CM

## 2017-07-25 DIAGNOSIS — D508 Other iron deficiency anemias: Secondary | ICD-10-CM | POA: Diagnosis not present

## 2017-07-25 DIAGNOSIS — Z1211 Encounter for screening for malignant neoplasm of colon: Secondary | ICD-10-CM

## 2017-07-25 LAB — POC HEMOCCULT BLD/STL (OFFICE/1-CARD/DIAGNOSTIC): Fecal Occult Blood, POC: NEGATIVE

## 2017-07-25 MED ORDER — LEVOTHYROXINE SODIUM 25 MCG PO TABS: 25.0000 ug | ORAL_TABLET | Freq: Every day | ORAL | 5 refills | Status: DC

## 2017-07-25 NOTE — Patient Instructions (Addendum)
F/u in 3 months, call if you need me sooner  Start synthroid 25 mcg one daily   Continue iron and vit B supplements.  Only tylenol for pain  Please get Chest scan, we will schedule  cBC and anemia panel and TSH 3 days before follow up appt  Thank you  for choosing [facility]. We consider it a privelige to serve you.  Delivering excellent health care in a caring and  compassionate way is our goal.  Partnering with you,  so that together we can achieve this goal is our strategy.

## 2017-07-26 DIAGNOSIS — D649 Anemia, unspecified: Secondary | ICD-10-CM | POA: Insufficient documentation

## 2017-07-26 DIAGNOSIS — Z0000 Encounter for general adult medical examination without abnormal findings: Secondary | ICD-10-CM | POA: Insufficient documentation

## 2017-07-26 DIAGNOSIS — E538 Deficiency of other specified B group vitamins: Secondary | ICD-10-CM | POA: Insufficient documentation

## 2017-07-26 LAB — CYTOLOGY - PAP: DIAGNOSIS: NEGATIVE

## 2017-07-26 NOTE — Progress Notes (Signed)
    Bianca Mcguire     MRN: [id number]      DOB: 04/28/1946  HPI: Patient is in for annual physical exam. Labs are reviewed and she is to start thyroid replacement Recent labs, if available are reviewed. Immunization is reviewed , and  updated if needed.   PE: BP 140/82   Pulse 70   Resp 16   Ht 5\' 2"  (1.575 m)   Wt 160 lb 1.9 oz (72.6 kg)   SpO2 97%   BMI 29.29 kg/m  Pleasant  female, alert and oriented x 3, in no cardio-pulmonary distress. Afebrile. HEENT No facial trauma or asymetry. Sinuses non tender.  Extra occullar muscles intact, pupils equally reactive to light. External ears normal, tympanic membranes clear. Oropharynx moist, no exudate. Neck: supple, no adenopathy,JVD or thyromegaly.No bruits.  Chest: Clear to ascultation bilaterally.No crackles or wheezes. Non tender to palpation  Breast: No asymetry,no masses or lumps. No tenderness. No nipple discharge or inversion. No axillary or supraclavicular adenopathy  Cardiovascular system; Heart sounds normal,  S1 and  S2 ,no S3.  No murmur, or thrill. Apical beat not displaced Peripheral pulses normal.  Abdomen: Soft, non tender, no organomegaly or masses. No bruits. Bowel sounds normal. No guarding, tenderness or rebound.  Rectal:  Normal sphincter tone. No rectal mass. Guaiac negative stool.  GU: External genitalia normal female genitalia , normal female distribution of hair. No lesions. Urethral meatus normal in size, no  Prolapse, no lesions visibly  Present. Bladder non tender. Vagina pink and moist , with no visible lesions , discharge present . Adequate pelvic support no  cystocele or rectocele noted Cervix pink and appears healthy, no lesions or ulcerations noted, no discharge noted from os Uterus atrophic, no adnexal masses, no cervical motion or adnexal tenderness.   Musculoskeletal exam: Decreased ROM of spine, hips , shoulders, left more than right  and knees. No deformity ,swelling  or crepitus noted. No muscle wasting or atrophy.   Neurologic: Cranial nerves 2 to 12 intact. Power, tone ,sensation and reflexes normal throughout. disturbance in gait. No tremor.  Skin: Intact, no ulceration, erythema , scaling or rash noted. Pigmentation normal throughout  Psych; Normal mood and affect. Judgement and concentration normal   Assessment & Plan:  Annual physical exam Annual exam as documented.  Immunization and cancer screening needs are specifically addressed at this visit.   Adult hypothyroidism Start synthroid 25 mcg daily and rept TSh in 6 to 8 weeks  IDA (iron deficiency anemia) Iron 325 mg twice daily and rept iron and CBC in 8 weeks  Vitamin B12 deficiency Once daily Vit B12 and rept level in 8 weeks

## 2017-07-26 NOTE — Assessment & Plan Note (Signed)
Start synthroid 25 mcg daily and rept TSh in 6 to 8 weeks

## 2017-07-26 NOTE — Assessment & Plan Note (Signed)
Once daily Vit B12 and rept level in 8 weeks

## 2017-07-26 NOTE — Assessment & Plan Note (Signed)
Iron 325 mg twice daily and rept iron and CBC in 8 weeks

## 2017-07-26 NOTE — Assessment & Plan Note (Signed)
Annual exam as documented. . Immunization and cancer screening needs are specifically addressed at this visit.  

## 2017-07-30 ENCOUNTER — Telehealth: Payer: Self-pay | Admitting: Family Medicine

## 2017-07-30 NOTE — Telephone Encounter (Signed)
-----   Message from Fayrene Helper, MD sent at 07/27/2017  1:53 AM EDT ----- Please advise the patient that pap is normal.

## 2017-07-30 NOTE — Telephone Encounter (Signed)
Left message to return call 

## 2017-07-31 NOTE — Telephone Encounter (Signed)
Patient informed of message below, verbalized understanding.  

## 2017-08-01 ENCOUNTER — Ambulatory Visit (HOSPITAL_COMMUNITY)
Admission: RE | Admit: 2017-08-01 | Discharge: 2017-08-01 | Disposition: A | Discharge status: Home / Self Care | Payer: Medicare HMO | Source: Ambulatory Visit | Attending: Family Medicine | Admitting: Family Medicine

## 2017-08-01 DIAGNOSIS — I700 Atherosclerosis of aorta: Secondary | ICD-10-CM | POA: Insufficient documentation

## 2017-08-01 DIAGNOSIS — J479 Bronchiectasis, uncomplicated: Secondary | ICD-10-CM | POA: Diagnosis not present

## 2017-08-01 DIAGNOSIS — Z122 Encounter for screening for malignant neoplasm of respiratory organs: Secondary | ICD-10-CM | POA: Diagnosis not present

## 2017-08-01 DIAGNOSIS — Z87891 Personal history of nicotine dependence: Secondary | ICD-10-CM | POA: Diagnosis not present

## 2017-08-01 DIAGNOSIS — F17210 Nicotine dependence, cigarettes, uncomplicated: Secondary | ICD-10-CM | POA: Diagnosis not present

## 2017-08-07 ENCOUNTER — Ambulatory Visit: Payer: Medicare HMO | Admitting: Orthopaedic Surgery

## 2017-08-14 ENCOUNTER — Ambulatory Visit (INDEPENDENT_AMBULATORY_CARE_PROVIDER_SITE_OTHER): Payer: Medicare HMO | Admitting: Gastroenterology

## 2017-08-14 ENCOUNTER — Other Ambulatory Visit: Payer: Self-pay

## 2017-08-14 ENCOUNTER — Encounter: Payer: Self-pay | Admitting: Gastroenterology

## 2017-08-14 VITALS — BP 155/85 | HR 70 | Temp 97.4°F | Ht 62.0 in | Wt 161.4 lb

## 2017-08-14 DIAGNOSIS — D649 Anemia, unspecified: Secondary | ICD-10-CM

## 2017-08-14 DIAGNOSIS — D509 Iron deficiency anemia, unspecified: Secondary | ICD-10-CM

## 2017-08-14 MED ORDER — PEG 3350-KCL-NA BICARB-NACL 420 G PO SOLR: 4000.0000 mL | ORAL | 0 refills | Status: DC

## 2017-08-14 NOTE — Progress Notes (Signed)
cc'ed to pcp °

## 2017-08-14 NOTE — Progress Notes (Addendum)
REVIEWED-NO ADDITIONAL RECOMMENDATIONS.  Primary Care Physician:  Fayrene Helper, MD Primary Gastroenterologist:  Dr. Oneida Alar   Chief Complaint  Patient presents with  . Anemia    HPI:   Bianca Mcguire is a 71 y.o. female presenting today at the request of Dr. Moshe Cipro due to IDA and history of tubular adenoma. Last colonoscopy in  2011 by [facility] with simple adenoma and was on 10 year recall.  Hgb 11.6, down from 12/13 range several years ago, Iron low at 31, ferritin down from 2 years ago to 58, B12 mildly low.   No overt GI bleeding. No changes in bowel habits. No abdominal pain. No unexplained weight loss or lack of appetite. No reflux issues. No dysphagia. Started taking Ibuprofen this week because tylenol wasn't helping arthritis.    Past Medical History:  Diagnosis Date  . Carpal tunnel syndrome, bilateral   . Diverticulosis dx 2011  . Low back pain   . Obesity   . Rheumatoid arthritis(714.0)     Past Surgical History:  Procedure Laterality Date  . bilateral foot surgery    . COLONOSCOPY  2011   Dr. Oneida Alar: 101mm sessile polyp (tubular adenoma), pancolonic diverticulosis  . [place]  . roophorectomy for benign disease  March 2011   Dr. Glo Herring  . tonsillectomy and adenoidctomy in childhood    . TUBAL LIGATION  1977    Current Outpatient Prescriptions  Medication Sig Dispense Refill  . acetaminophen (TYLENOL) 500 MG tablet One twice daily for arthritic pain (Patient taking differently: Take 1,000 mg by mouth every 4 (four) hours as needed for mild pain. One twice daily for arthritic pain) 60 tablet 2  . alendronate (FOSAMAX) 70 MG tablet Take 1 tablet (70 mg total) by mouth every 7 (seven) days. Take with a full glass of water on an empty stomach. 4 tablet 11  . aspirin EC 81 MG tablet Take 1 tablet (81 mg total) by mouth daily. 100 tablet 3  . calcium-vitamin D (OSCAL-500) 500-400 MG-UNIT tablet Take 1 tablet by mouth 2 (two) times daily. 60  tablet 11  . levothyroxine (SYNTHROID, LEVOTHROID) 25 MCG tablet Take 1 tablet (25 mcg total) by mouth daily before breakfast. 30 tablet 5  . ranitidine (ZANTAC) 300 MG tablet Take 1 tablet (300 mg total) by mouth at bedtime. 14 tablet 0   No current facility-administered medications for this visit.     Allergies as of 08/14/2017  . (No Known Allergies)    Family History  Problem Relation Age of Onset  . Heart failure Mother   . Cancer Mother 12       oral  . Heart failure Father   . Arthritis Sister   . Heart attack Brother   . Thyroid disease Daughter   . Hypertension Daughter   . Hypertension Daughter   . Thyroid disease Daughter   . Heart failure Sister   . Diabetes Sister   . Hypertension Sister   . Leukemia Brother   . Colon cancer Neg Hx   . Colon polyps Neg Hx     Social History   Social History  . Marital status: Widowed    Spouse name: N/A  . Number of children: 3  . Years of education: N/A   Occupational History  . retired     part time   . disabled for arthritis      since age 60  .  [employer]   Social History Main  Topics  . Smoking status: Former Smoker    Packs/day: 2.00    Years: 25.00    Types: Cigarettes    Quit date: 05/12/2014  . Smokeless tobacco: Never Used  . Alcohol use No  . Drug use: No  . Sexual activity: Not Currently   Other Topics Concern  . Not on file   Social History Narrative   Lives with Granddaughter since 2006    Review of Systems: Gen: Denies any fever, chills, fatigue, weight loss, lack of appetite.  CV: Denies chest pain, heart palpitations, peripheral edema, syncope.  Resp: +cough  GI: Denies dysphagia or odynophagia. Denies jaundice, hematemesis, fecal incontinence. GU : Denies urinary burning, urinary frequency, urinary hesitancy MS: +joint pain, arthritis  Derm: Denies rash, itching, dry skin Psych: Denies depression, anxiety, memory loss, and confusion Heme: Denies bruising, bleeding, and enlarged lymph  nodes.  Physical Exam: BP (!) 155/85   Pulse 70   Temp (!) 97.4 F (36.3 C) (Oral)   Ht 5\' 2"  (1.575 m)   Wt 161 lb 6.4 oz (73.2 kg)   BMI 29.52 kg/m  General:   Alert and oriented. Pleasant and cooperative. Well-nourished and well-developed.  Head:  Normocephalic and atraumatic. Eyes:  Without icterus, sclera clear and conjunctiva pink.  Ears:  Normal auditory acuity. Nose:  No deformity, discharge,  or lesions. Mouth:  No deformity or lesions, oral mucosa pink.  Lungs:  Clear to auscultation bilaterally. No wheezes, rales, or rhonchi. No distress.  Heart:  S1, S2 present without murmurs appreciated.  Abdomen:  +BS, soft, non-tender and non-distended. No HSM noted. No guarding or rebound. No masses appreciated.  Rectal:  Deferred  Msk:  Symmetrical without gross deformities. Normal posture. Extremities:  Without edema. Neurologic:  Alert and  oriented x 4 Psych:  Alert and cooperative. Normal mood and affect.  Lab Results  Component Value Date   WBC 6.0 06/05/2017   HGB 11.6 (L) 06/05/2017   HCT 37.0 06/05/2017   MCV 79.7 (L) 06/05/2017   PLT 193 06/05/2017   Lab Results  Component Value Date   IRON 26 (L) 06/05/2017   IRON SEE NOTE 06/05/2017   IRON 31 (L) 06/05/2017   TIBC 361 06/05/2017   TIBC CANCELED 06/05/2017   TIBC 360 06/05/2017   FERRITIN 57 06/05/2017   FERRITIN 55 06/05/2017   FERRITIN 58 06/05/2017

## 2017-08-14 NOTE — Patient Instructions (Signed)
We have scheduled you for a colonoscopy and possible upper endoscopy with Dr. Oneida Alar in the near future.  Try to avoid Ibuprofen, Aleve, Advil, Motrin, etc.   Further recommendations to follow!

## 2017-08-14 NOTE — Assessment & Plan Note (Signed)
71 year old female with new onset IDA without overt signs of GI bleeding. Recently heme negative. Last colonoscopy 2011 with simple adenoma. No prior EGD. Occasional NSAIDs for joint pain, which I have asked her to avoid. Needs colonoscopy +/- EGD for diagnostic purposes. No other alarm signs.  Proceed with colonoscopy +/- EGD with Dr. Oneida Alar in the near future. The risks, benefits, and alternatives have been discussed in detail with the patient. They state understanding and desire to proceed.

## 2017-09-16 ENCOUNTER — Telehealth: Payer: Self-pay | Admitting: Gastroenterology

## 2017-09-16 ENCOUNTER — Other Ambulatory Visit: Payer: Self-pay

## 2017-09-16 ENCOUNTER — Ambulatory Visit (HOSPITAL_COMMUNITY)
Admission: RE | Admit: 2017-09-16 | Discharge: 2017-09-16 | Disposition: A | Discharge status: Home / Self Care | Payer: Medicare HMO | Source: Ambulatory Visit | Attending: Gastroenterology | Admitting: Gastroenterology

## 2017-09-16 ENCOUNTER — Encounter (HOSPITAL_COMMUNITY)
Admission: RE | Disposition: A | Discharge status: Home / Self Care | Payer: Self-pay | Source: Ambulatory Visit | Attending: Gastroenterology

## 2017-09-16 ENCOUNTER — Encounter (HOSPITAL_COMMUNITY): Payer: Self-pay | Admitting: *Deleted

## 2017-09-16 DIAGNOSIS — K5730 Diverticulosis of large intestine without perforation or abscess without bleeding: Secondary | ICD-10-CM | POA: Insufficient documentation

## 2017-09-16 DIAGNOSIS — Z79899 Other long term (current) drug therapy: Secondary | ICD-10-CM | POA: Insufficient documentation

## 2017-09-16 DIAGNOSIS — D509 Iron deficiency anemia, unspecified: Secondary | ICD-10-CM

## 2017-09-16 DIAGNOSIS — K2970 Gastritis, unspecified, without bleeding: Secondary | ICD-10-CM

## 2017-09-16 DIAGNOSIS — Z7983 Long term (current) use of bisphosphonates: Secondary | ICD-10-CM | POA: Insufficient documentation

## 2017-09-16 DIAGNOSIS — K317 Polyp of stomach and duodenum: Secondary | ICD-10-CM | POA: Diagnosis not present

## 2017-09-16 DIAGNOSIS — Z87891 Personal history of nicotine dependence: Secondary | ICD-10-CM | POA: Diagnosis not present

## 2017-09-16 DIAGNOSIS — Z7982 Long term (current) use of aspirin: Secondary | ICD-10-CM | POA: Diagnosis not present

## 2017-09-16 DIAGNOSIS — D649 Anemia, unspecified: Secondary | ICD-10-CM | POA: Diagnosis not present

## 2017-09-16 DIAGNOSIS — K2950 Unspecified chronic gastritis without bleeding: Secondary | ICD-10-CM | POA: Diagnosis not present

## 2017-09-16 DIAGNOSIS — K648 Other hemorrhoids: Secondary | ICD-10-CM | POA: Diagnosis not present

## 2017-09-16 DIAGNOSIS — K3189 Other diseases of stomach and duodenum: Secondary | ICD-10-CM | POA: Diagnosis not present

## 2017-09-16 DIAGNOSIS — K269 Duodenal ulcer, unspecified as acute or chronic, without hemorrhage or perforation: Secondary | ICD-10-CM | POA: Diagnosis not present

## 2017-09-16 HISTORY — DX: Headache: R51

## 2017-09-16 HISTORY — PX: ESOPHAGOGASTRODUODENOSCOPY: SHX5428

## 2017-09-16 HISTORY — DX: Anemia, unspecified: D64.9

## 2017-09-16 HISTORY — DX: Headache, unspecified: R51.9

## 2017-09-16 HISTORY — DX: Unspecified osteoarthritis, unspecified site: M19.90

## 2017-09-16 HISTORY — PX: FLEXIBLE SIGMOIDOSCOPY: SHX5431

## 2017-09-16 SURGERY — EGD (ESOPHAGOGASTRODUODENOSCOPY)
Anesthesia: Moderate Sedation

## 2017-09-16 MED ORDER — MEPERIDINE HCL 100 MG/ML IJ SOLN
INTRAMUSCULAR | Status: AC
  Filled 2017-09-16: qty 2

## 2017-09-16 MED ORDER — SODIUM CHLORIDE 0.9 % IV SOLN
INTRAVENOUS | Status: DC
  Administered 2017-09-16: 1000 mL via INTRAVENOUS

## 2017-09-16 MED ORDER — LIDOCAINE VISCOUS 2 % MT SOLN
OROMUCOSAL | Status: DC | PRN
  Administered 2017-09-16: 1 via OROMUCOSAL

## 2017-09-16 MED ORDER — LIDOCAINE VISCOUS 2 % MT SOLN
OROMUCOSAL | Status: AC
  Filled 2017-09-16: qty 15

## 2017-09-16 MED ORDER — MIDAZOLAM HCL 5 MG/5ML IJ SOLN
INTRAMUSCULAR | Status: DC | PRN
  Administered 2017-09-16 (×2): 2 mg via INTRAVENOUS
  Administered 2017-09-16: 1 mg via INTRAVENOUS

## 2017-09-16 MED ORDER — MIDAZOLAM HCL 5 MG/5ML IJ SOLN
INTRAMUSCULAR | Status: AC
  Filled 2017-09-16: qty 10

## 2017-09-16 MED ORDER — MEPERIDINE HCL 100 MG/ML IJ SOLN
INTRAMUSCULAR | Status: DC | PRN
  Administered 2017-09-16 (×2): 25 mg

## 2017-09-16 NOTE — Telephone Encounter (Signed)
Per Threasa Beards please schedule the patient on 10/18 at 7:30 am.  Please fax instructions to [phone]

## 2017-09-16 NOTE — Telephone Encounter (Signed)
Pt to come by office to pick-up Moviprep sample and instructions.

## 2017-09-16 NOTE — Telephone Encounter (Signed)
Routing to CM to assist with scheduling.

## 2017-09-16 NOTE — Op Note (Signed)
[facility] Patient Name: Bianca Mcguire Procedure Date: 09/16/2017 11:01 AM MRN: [id number] Date of Birth: [birth year] Attending MD: Barney Drain MD, MD CSN: [id number] Age: 71 Admit Type: Outpatient Procedure:                Flexible Sigmoidoscopy, DIAGNOSTIC Indications:              Anemia-NORMOCYTIC(JUN 2018: B12 194, FERRITIN 55,                            Hb 11.6, MCV 79.7) Providers:                Barney Drain MD, MD, Bianca Riggers, RN, Aram Candela Referring MD:             Norwood Levo. Simpson MD, MD Medicines:                Meperidine 50 mg IV, Midazolam 4 mg IV Complications:            No immediate complications. Estimated Blood Loss:     Estimated blood loss: none. Procedure:                Pre-Anesthesia Assessment:                           - Prior to the procedure, a History and Physical                            was performed, and patient medications and                            allergies were reviewed. The patient's tolerance of                            previous anesthesia was also reviewed. The risks                            and benefits of the procedure and the sedation                            options and risks were discussed with the patient.                            All questions were answered, and informed consent                            was obtained. Prior Anticoagulants: The patient has                            taken aspirin, last dose was 1 day prior to                            procedure. ASA Grade Assessment: II - A patient                            with mild systemic disease. After reviewing the  risks and benefits, the patient was deemed in                            satisfactory condition to undergo the procedure.                            After obtaining informed consent, the scope was                            passed under direct vision. The [id number] ([id number])                            scope was  introduced through the anus and advanced                            to the the sigmoid colon. After obtaining informed                            consent, the scope was passed under direct                            vision.The quality of the bowel preparation was                            poor. The flexible sigmoidoscopy was technically                            difficult and complex due to poor bowel prep.                            Successful completion of the procedure was aided by                            lavage. Scope In: 11:37:10 AM Scope Out: 11:43:28 AM Total Procedure Duration: 0 hours 6 minutes 18 seconds  Findings:      Multiple small and large-mouthed diverticula were found in the       recto-sigmoid colon and sigmoid colon.      Internal hemorrhoids were found during retroflexion. The hemorrhoids       were moderate. Impression:               - Preparation of the colon was poor. Moderate Sedation:      Moderate (conscious) sedation was administered by the endoscopy nurse       and supervised by the endoscopist. The following parameters were       monitored: oxygen saturation, heart rate, blood pressure, and response       to care. Total physician intraservice time was 24 minutes. Recommendation:           - Continue present medications.                           - Resume previous diet.                           - Return  to my office in 4 months.                           - Patient has a contact number available for                            emergencies. The signs and symptoms of potential                            delayed complications were discussed with the                            patient. Return to normal activities tomorrow.                            Written discharge instructions were provided to the                            patient. Procedure Code(s):        --- Professional ---                           [phone], Sigmoidoscopy, flexible; diagnostic,                             including collection of specimen(s) by brushing or                            washing, when performed (separate procedure)                           [id number], Moderate sedation services provided by the                            same physician or other qualified health care                            professional performing the diagnostic or                            therapeutic service that the sedation supports,                            requiring the presence of an independent trained                            observer to assist in the monitoring of the                            patient's level of consciousness and physiological                            status; initial 15 minutes of intraservice time,  patient age 32 years or older                           [phone], Moderate sedation services; each additional                            15 minutes intraservice time Diagnosis Code(s):        --- Professional ---                           D64.9, Anemia, unspecified CPT copyright 2016 American Medical Association. All rights reserved. The codes documented in this report are preliminary and upon coder review may  be revised to meet current compliance requirements. Barney Drain, MD Barney Drain MD, MD 09/16/2017 12:05:14 PM This report has been signed electronically. Number of Addenda: 0

## 2017-09-16 NOTE — Discharge Instructions (Signed)
Your prep was not good. YOU had formed stool AND A LARGE AMOUNT OF LIQUID STOOL in your colon SO I COMPLETED A FLEX SIG. I ONLY SAW THE LAST THIRD OF YOUR COLON. YOU DID NOT HAVE ANY POLYPS. YOU HAVE DIVERTICULOSIS IN YOUR LEFT COLON. YOU HAVE GASTRITIS AND STOMACH POLYPS.   DRINK WATER TO KEEP YOUR URINE LIGHT YELLOW.  FOLLOW A HIGH FIBER DIET. AVOID ITEMS THAT CAUSE BLOATING & GAS. SEE INFO BELOW.  TO PREVENT ULCERS AND TREAT GASTRITIS OMEPRAZOLE.  TAKE 30 MINUTES PRIOR TO YOUR FIRST MEAL.  ZANTAC HELPS MOST WHEN USED AS NEEDED.   Next colonoscopy ON TOMORROW TO COMPLETE YOUR EVALUATION FOR A LOW BLOOD COUNT. FAILURE TO  RETURN COULD RESULT IN A MISSED POLYP OR COLON CANCER. YOU CAN PICK UP A LOW VOLUME PREP SAMPLE AT THE OFFICE.   Colonoscopy Care After Read the instructions outlined below and refer to this sheet in the next week. These discharge instructions provide you with general information on caring for yourself after you leave the hospital. While your treatment has been planned according to the most current medical practices available, unavoidable complications occasionally occur. If you have any problems or questions after discharge, call DR. Glenora Morocho, [phone].  ACTIVITY  You may resume your regular activity, but move at a slower pace for the next 24 hours.   Take frequent rest periods for the next 24 hours.   Walking will help get rid of the air and reduce the bloated feeling in your belly (abdomen).   No driving for 24 hours (because of the medicine (anesthesia) used during the test).   You may shower.   Do not sign any important legal documents or operate any machinery for 24 hours (because of the anesthesia used during the test).    NUTRITION  Drink plenty of fluids.   You may resume your normal diet as instructed by your doctor.   Begin with a light meal and progress to your normal diet. Heavy or fried foods are harder to digest and may make you feel sick to  your stomach (nauseated).   Avoid alcoholic beverages for 24 hours or as instructed.    MEDICATIONS  You may resume your normal medications.   WHAT YOU CAN EXPECT TODAY  Some feelings of bloating in the abdomen.   Passage of more gas than usual.   Spotting of blood in your stool or on the toilet paper  .  IF YOU HAD POLYPS REMOVED DURING THE COLONOSCOPY:  Eat a soft diet IF YOU HAVE NAUSEA, BLOATING, ABDOMINAL PAIN, OR VOMITING.    FINDING OUT THE RESULTS OF YOUR TEST Not all test results are available during your visit. DR. Oneida Alar WILL CALL YOU WITHIN 14 DAYS OF YOUR PROCEDUE WITH YOUR RESULTS. Do not assume everything is normal if you have not heard from DR. Marykatherine Sherwood, CALL HER OFFICE AT [phone].  SEEK IMMEDIATE MEDICAL ATTENTION AND CALL THE OFFICE: [phone] IF:  You have more than a spotting of blood in your stool.   Your belly is swollen (abdominal distention).   You are nauseated or vomiting.   You have a temperature over 101F.   You have abdominal pain or discomfort that is severe or gets worse throughout the day.   High-Fiber Diet A high-fiber diet changes your normal diet to include more whole grains, legumes, fruits, and vegetables. Changes in the diet involve replacing refined carbohydrates with unrefined foods. The calorie level of the diet is essentially unchanged. The Dietary  Reference Intake (recommended amount) for adult males is 38 grams per day. For adult females, it is 25 grams per day. Pregnant and lactating women should consume 28 grams of fiber per day. Fiber is the intact part of a plant that is not broken down during digestion. Functional fiber is fiber that has been isolated from the plant to provide a beneficial effect in the body. PURPOSE  Increase stool bulk.   Ease and regulate bowel movements.   Lower cholesterol.   REDUCE RISK OF COLON CANCER  INDICATIONS THAT YOU NEED MORE FIBER  Constipation and hemorrhoids.    Uncomplicated diverticulosis (intestine condition) and irritable bowel syndrome.   Weight management.   As a protective measure against hardening of the arteries (atherosclerosis), diabetes, and cancer.   GUIDELINES FOR INCREASING FIBER IN THE DIET  Start adding fiber to the diet slowly. A gradual increase of about 5 more grams (2 slices of whole-wheat bread, 2 servings of most fruits or vegetables, or 1 bowl of high-fiber cereal) per day is best. Too rapid an increase in fiber may result in constipation, flatulence, and bloating.   Drink enough water and fluids to keep your urine clear or pale yellow. Water, juice, or caffeine-free drinks are recommended. Not drinking enough fluid may cause constipation.   Eat a variety of high-fiber foods rather than one type of fiber.   Try to increase your intake of fiber through using high-fiber foods rather than fiber pills or supplements that contain small amounts of fiber.   The goal is to change the types of food eaten. Do not supplement your present diet with high-fiber foods, but replace foods in your present diet.   INCLUDE A VARIETY OF FIBER SOURCES  Replace refined and processed grains with whole grains, canned fruits with fresh fruits, and incorporate other fiber sources. White rice, white breads, and most bakery goods contain little or no fiber.   Brown whole-grain rice, buckwheat oats, and many fruits and vegetables are all good sources of fiber. These include: broccoli, Brussels sprouts, cabbage, cauliflower, beets, sweet potatoes, white potatoes (skin on), carrots, tomatoes, eggplant, squash, berries, fresh fruits, and dried fruits.   Cereals appear to be the richest source of fiber. Cereal fiber is found in whole grains and bran. Bran is the fiber-rich outer coat of cereal grain, which is largely removed in refining. In whole-grain cereals, the bran remains. In breakfast cereals, the largest amount of fiber is found in those with  "bran" in their names. The fiber content is sometimes indicated on the label.   You may need to include additional fruits and vegetables each day.   In baking, for 1 cup white flour, you may use the following substitutions:   1 cup whole-wheat flour minus 2 tablespoons.   1/2 cup white flour plus 1/2 cup whole-wheat flour.   Gastritis  Gastritis is an inflammation (the body's way of reacting to injury and/or infection) of the stomach. It is often caused by viral or bacterial (germ) infections. It can also be caused BY ASPIRIN, BC/GOODY POWDER'S, (IBUPROFEN) MOTRIN, OR ALEVE (NAPROXEN), chemicals (including alcohol), SPICY FOODS, and medications. This illness may be associated with generalized malaise (feeling tired, not well), UPPER ABDOMINAL STOMACH cramps, and fever. One common bacterial cause of gastritis is an organism known as H. Pylori. This can be treated with antibiotics.

## 2017-09-16 NOTE — Telephone Encounter (Signed)
Orders entered for colonoscopy.

## 2017-09-16 NOTE — H&P (Addendum)
Primary Care Physician:  Fayrene Helper, MD Primary Gastroenterologist:  Dr. Oneida Alar  Pre-Procedure History & Physical: HPI:  Bianca Mcguire is a 71 y.o. female here for normocytic ANEMIA.  Past Medical History:  Diagnosis Date  . Anemia   . Arthritis   . Carpal tunnel syndrome, bilateral   . Diverticulosis dx 2011  . Headache   . Low back pain   . Obesity   . Rheumatoid arthritis(714.0)     Past Surgical History:  Procedure Laterality Date  . bilateral foot surgery    . COLONOSCOPY  2011   Dr. Oneida Alar: 46mm sessile polyp (tubular adenoma), pancolonic diverticulosis  . [place]  . roophorectomy for benign disease  March 2011   Dr. Glo Herring  . tonsillectomy and adenoidctomy in childhood    . TUBAL LIGATION  1977    Prior to Admission medications   Medication Sig Start Date End Date Taking? Authorizing Provider  acetaminophen (TYLENOL) 500 MG tablet One twice daily for arthritic pain Patient taking differently: Take 1,000 mg by mouth every 4 (four) hours as needed for mild pain. One twice daily for arthritic pain 06/07/14  Yes Fayrene Helper, MD  alendronate (FOSAMAX) 70 MG tablet Take 1 tablet (70 mg total) by mouth every 7 (seven) days. Take with a full glass of water on an empty stomach. Patient taking differently: Take 70 mg by mouth every 7 (seven) days. Every fridays. Take with a full glass of water on an empty stomach. 05/22/17  Yes Fayrene Helper, MD  aspirin EC 81 MG tablet Take 1 tablet (81 mg total) by mouth daily. 05/22/17  Yes Fayrene Helper, MD  calcium-vitamin D (OSCAL-500) 500-400 MG-UNIT tablet Take 1 tablet by mouth 2 (two) times daily. 05/22/17  Yes Fayrene Helper, MD  folic acid (FOLVITE) 443 MCG tablet Take 800 mcg by mouth daily.   Yes [provider]  levothyroxine (SYNTHROID, LEVOTHROID) 25 MCG tablet Take 1 tablet (25 mcg total) by mouth daily before breakfast. 07/25/17  Yes Fayrene Helper, MD  Multiple  Vitamins-Iron (MULTIVITAMIN/IRON PO) Take 1 tablet by mouth daily.   Yes [provider]  polyethylene glycol-electrolytes (TRILYTE) 420 g solution Take 4,000 mLs by mouth as directed. 08/14/17  Yes Dalvin Clipper, Marga Melnick, MD  vitamin B-12 (CYANOCOBALAMIN) 250 MCG tablet Take 500 mcg by mouth daily.   Yes [provider]  ranitidine (ZANTAC) 300 MG tablet Take 1 tablet (300 mg total) by mouth at bedtime. Patient taking differently: Take 300 mg by mouth daily as needed (indegestion).  05/22/17   Fayrene Helper, MD    Allergies as of 08/14/2017  . (No Known Allergies)    Family History  Problem Relation Age of Onset  . Heart failure Mother   . Cancer Mother 7       oral  . Heart failure Father   . Arthritis Sister   . Heart attack Brother   . Thyroid disease Daughter   . Hypertension Daughter   . Hypertension Daughter   . Thyroid disease Daughter   . Heart failure Sister   . Diabetes Sister   . Hypertension Sister   . Leukemia Brother   . Colon cancer Neg Hx   . Colon polyps Neg Hx     Social History   Social History  . Marital status: Widowed    Spouse name: N/A  . Number of children: 3  . Years of education: N/A   Occupational History  .  retired     part time   . disabled for arthritis      since age 24  .  [employer]   Social History Main Topics  . Smoking status: Former Smoker    Packs/day: 2.00    Years: 25.00    Types: Cigarettes    Quit date: 05/12/2014  . Smokeless tobacco: Never Used  . Alcohol use No  . Drug use: No  . Sexual activity: Not Currently   Other Topics Concern  . Not on file   Social History Narrative   Lives with Granddaughter since 2006    Review of Systems: See HPI, otherwise negative ROS   Physical Exam: BP (!) 158/76   Pulse (!) 57   Temp 97.9 F (36.6 C) (Oral)   Resp 19   SpO2 100%  General:   Alert,  pleasant and cooperative in NAD Head:  Normocephalic and atraumatic. Neck:  Supple; Lungs:  Clear  throughout to auscultation.    Heart:  Regular rate and rhythm. Abdomen:  Soft, nontender and nondistended. Normal bowel sounds, without guarding, and without rebound.   Neurologic:  Alert and  oriented x4;  grossly normal neurologically.  Impression/Plan:   Normocytic ANEMIA.  PLAN: EGD/TCS TODAY  DISCUSSED PROCEDURE, BENEFITS, & RISKS: < 1% chance of medication reaction, bleeding, perforation, or rupture of spleen/liver.

## 2017-09-16 NOTE — Op Note (Signed)
[facility] Patient Name: Bianca Mcguire Procedure Date: 09/16/2017 11:08 AM MRN: [id number] Date of Birth: [birth year] Attending MD: Barney Drain MD, MD CSN: [id number] Age: 71 Admit Type: Outpatient Procedure:                Upper GI endoscopy WITH COLD FORCEPS BIOPSY Indications:              Anemia Providers:                Barney Drain MD, MD, Janeece Riggers, RN, Aram Candela,                            Rosina Lowenstein, RN Referring MD:             Norwood Levo. Simpson MD, MD Medicines:                TCS + Midazolam 1 mg IV Complications:            No immediate complications. Estimated Blood Loss:     Estimated blood loss was minimal. Procedure:                Pre-Anesthesia Assessment:                           - Prior to the procedure, a History and Physical                            was performed, and patient medications and                            allergies were reviewed. The patient's tolerance of                            previous anesthesia was also reviewed. The risks                            and benefits of the procedure and the sedation                            options and risks were discussed with the patient.                            All questions were answered, and informed consent                            was obtained. Prior Anticoagulants: The patient has                            taken aspirin, last dose was 1 day prior to                            procedure. ASA Grade Assessment: II - A patient                            with mild systemic disease. After reviewing the  risks and benefits, the patient was deemed in                            satisfactory condition to undergo the procedure.                            After obtaining informed consent, the endoscope was                            passed under direct vision. Throughout the                            procedure, the patient's blood pressure, pulse, and                   oxygen saturations were monitored continuously. The                            [id number]) scope was introduced through the                            mouth, and advanced to the second part of duodenum.                            The upper GI endoscopy was accomplished without                            difficulty. The patient tolerated the procedure                            well. Scope In: 11:48:48 AM Scope Out: 11:58:36 AM Total Procedure Duration: 0 hours 9 minutes 48 seconds  Findings:      The examined esophagus was normal.      Multiple diminutive sessile polyps with no bleeding and no stigmata of       recent bleeding were found in the cardia, in the gastric fundus and in       the gastric body. The polyp was removed with a cold biopsy forceps.       Resection and retrieval were complete.      Diffuse mild inflammation characterized by congestion (edema) and       erythema was found in the gastric antrum. Biopsies were taken with a       cold forceps for Helicobacter pylori testing.      The examined duodenum was normal. Biopsies for histology were taken with       a cold forceps for evaluation of celiac disease. Impression:               -                           - Multiple gastric polyps, possible ADENOMATOUS.                           - MILD Gastritis. Biopsied.                           -  ANEMIA LIKELY DUE TO GASTRIC POLYPS/GASTRITIS.                            UNABLE TO CLEAR COLON DUE TO POOR BOWEL PREP. Moderate Sedation:      Moderate (conscious) sedation was administered by the endoscopy nurse       and supervised by the endoscopist. The following parameters were       monitored: oxygen saturation, heart rate, blood pressure, and response       to care. Total physician intraservice time was 23 minutes. Recommendation:           - Await pathology results.                           - Resume previous diet.                           - Continue  present medications. REPEAT TCS IN NEAR                            FUTURE TO CLEAR COLON.                           - Use Prilosec (omeprazole) 20 mg PO daily. ZANTAC                            PRN.                           - Patient has a contact number available for                            emergencies. The signs and symptoms of potential                            delayed complications were discussed with the                            patient. Return to normal activities tomorrow.                            Written discharge instructions were provided to the                            patient. Procedure Code(s):        --- Professional ---                           [phone], Esophagogastroduodenoscopy, flexible,                            transoral; with biopsy, single or multiple                           99152, Moderate sedation services provided by the  same physician or other qualified health care                            professional performing the diagnostic or                            therapeutic service that the sedation supports,                            requiring the presence of an independent trained                            observer to assist in the monitoring of the                            patient's level of consciousness and physiological                            status; initial 15 minutes of intraservice time,                            patient age 76 years or older                           [phone], Moderate sedation services; each additional                            15 minutes intraservice time Diagnosis Code(s):        --- Professional ---                           K31.7, Polyp of stomach and duodenum                           K29.70, Gastritis, unspecified, without bleeding                           D64.9, Anemia, unspecified CPT copyright 2016 American Medical Association. All rights reserved. The codes documented in this  report are preliminary and upon coder review may  be revised to meet current compliance requirements. Barney Drain, MD Barney Drain MD, MD 09/16/2017 12:12:46 PM This report has been signed electronically. Number of Addenda: 0

## 2017-09-16 NOTE — Progress Notes (Signed)
Unable to schedule patient for colonoscopy tomorrow due to scheduling issues. Discussed with office and Dr. Oneida Alar. Patient to go over to office at this time for new instructions and prep.

## 2017-09-16 NOTE — Telephone Encounter (Signed)
REPEAT colonoscopy ON TOMORROW TO COMPLETE YOUR EVALUATION FOR A LOW BLOOD COUNT.  PT NEED LOW VOLUME PREP SAMPLE: CLEANPIQ, SUPREP, OR MOVIPREP.  DISCUSSED WITH MARTINA.

## 2017-09-18 ENCOUNTER — Encounter (HOSPITAL_COMMUNITY): Payer: Self-pay | Admitting: Gastroenterology

## 2017-09-26 ENCOUNTER — Ambulatory Visit (HOSPITAL_COMMUNITY)
Admission: RE | Admit: 2017-09-26 | Discharge: 2017-09-26 | Disposition: A | Discharge status: Home / Self Care | Payer: Medicare HMO | Source: Ambulatory Visit | Attending: Gastroenterology | Admitting: Gastroenterology

## 2017-09-26 ENCOUNTER — Encounter (HOSPITAL_COMMUNITY): Payer: Self-pay | Admitting: *Deleted

## 2017-09-26 ENCOUNTER — Encounter (HOSPITAL_COMMUNITY)
Admission: RE | Disposition: A | Discharge status: Home / Self Care | Payer: Self-pay | Source: Ambulatory Visit | Attending: Gastroenterology

## 2017-09-26 ENCOUNTER — Other Ambulatory Visit: Payer: Self-pay

## 2017-09-26 DIAGNOSIS — K649 Unspecified hemorrhoids: Secondary | ICD-10-CM | POA: Diagnosis not present

## 2017-09-26 DIAGNOSIS — K648 Other hemorrhoids: Secondary | ICD-10-CM | POA: Insufficient documentation

## 2017-09-26 DIAGNOSIS — Z7982 Long term (current) use of aspirin: Secondary | ICD-10-CM | POA: Diagnosis not present

## 2017-09-26 DIAGNOSIS — K5730 Diverticulosis of large intestine without perforation or abscess without bleeding: Secondary | ICD-10-CM | POA: Diagnosis not present

## 2017-09-26 DIAGNOSIS — D649 Anemia, unspecified: Secondary | ICD-10-CM | POA: Insufficient documentation

## 2017-09-26 DIAGNOSIS — Z87891 Personal history of nicotine dependence: Secondary | ICD-10-CM | POA: Diagnosis not present

## 2017-09-26 DIAGNOSIS — Z79899 Other long term (current) drug therapy: Secondary | ICD-10-CM | POA: Insufficient documentation

## 2017-09-26 DIAGNOSIS — Q438 Other specified congenital malformations of intestine: Secondary | ICD-10-CM | POA: Diagnosis not present

## 2017-09-26 DIAGNOSIS — Z7983 Long term (current) use of bisphosphonates: Secondary | ICD-10-CM | POA: Insufficient documentation

## 2017-09-26 HISTORY — PX: COLONOSCOPY: SHX5424

## 2017-09-26 SURGERY — COLONOSCOPY
Anesthesia: Moderate Sedation

## 2017-09-26 MED ORDER — MEPERIDINE HCL 100 MG/ML IJ SOLN
INTRAMUSCULAR | Status: AC
  Filled 2017-09-26: qty 2

## 2017-09-26 MED ORDER — SIMETHICONE 40 MG/0.6ML PO SUSP
ORAL | Status: DC | PRN
  Administered 2017-09-26: 2.5 mL

## 2017-09-26 MED ORDER — SODIUM CHLORIDE 0.9 % IV SOLN
INTRAVENOUS | Status: DC
  Administered 2017-09-26: 07:00:00 via INTRAVENOUS

## 2017-09-26 MED ORDER — MIDAZOLAM HCL 5 MG/5ML IJ SOLN
INTRAMUSCULAR | Status: AC
  Filled 2017-09-26: qty 10

## 2017-09-26 MED ORDER — OMEPRAZOLE 20 MG PO CPDR: DELAYED_RELEASE_CAPSULE | ORAL | 3 refills | Status: DC

## 2017-09-26 MED ORDER — MIDAZOLAM HCL 5 MG/5ML IJ SOLN
INTRAMUSCULAR | Status: DC | PRN
  Administered 2017-09-26 (×2): 2 mg via INTRAVENOUS
  Administered 2017-09-26: 1 mg via INTRAVENOUS

## 2017-09-26 MED ORDER — MEPERIDINE HCL 100 MG/ML IJ SOLN
INTRAMUSCULAR | Status: DC | PRN
  Administered 2017-09-26 (×2): 25 mg via INTRAVENOUS

## 2017-09-26 NOTE — Interval H&P Note (Signed)
History and Physical Interval Note:  09/26/2017 7:32 AM  Bianca Mcguire  has presented today for surgery, with the diagnosis of anemia  The various methods of treatment have been discussed with the patient and family. After consideration of risks, benefits and other options for treatment, the patient has consented to  Procedure(s) with comments: COLONOSCOPY (N/A) - 7:30am as a surgical intervention .  The patient's history has been reviewed, patient examined, no change in status, stable for surgery.  I have reviewed the patient's chart and labs.  Questions were answered to the patient's satisfaction.     [employer]

## 2017-09-26 NOTE — Progress Notes (Signed)
Lab order on file for Jan 2019.

## 2017-09-26 NOTE — H&P (View-Only) (Signed)
Primary Care Physician:  Fayrene Helper, MD Primary Gastroenterologist:  Dr. Oneida Alar  Pre-Procedure History & Physical: HPI:  Bianca Mcguire is a 71 y.o. female here for normocytic ANEMIA.  Past Medical History:  Diagnosis Date  . Anemia   . Arthritis   . Carpal tunnel syndrome, bilateral   . Diverticulosis dx 2011  . Headache   . Low back pain   . Obesity   . Rheumatoid arthritis(714.0)     Past Surgical History:  Procedure Laterality Date  . bilateral foot surgery    . COLONOSCOPY  2011   Dr. Oneida Alar: 10mm sessile polyp (tubular adenoma), pancolonic diverticulosis  . [place]  . roophorectomy for benign disease  March 2011   Dr. Glo Herring  . tonsillectomy and adenoidctomy in childhood    . TUBAL LIGATION  1977    Prior to Admission medications   Medication Sig Start Date End Date Taking? Authorizing Provider  acetaminophen (TYLENOL) 500 MG tablet One twice daily for arthritic pain Patient taking differently: Take 1,000 mg by mouth every 4 (four) hours as needed for mild pain. One twice daily for arthritic pain 06/07/14  Yes Fayrene Helper, MD  alendronate (FOSAMAX) 70 MG tablet Take 1 tablet (70 mg total) by mouth every 7 (seven) days. Take with a full glass of water on an empty stomach. Patient taking differently: Take 70 mg by mouth every 7 (seven) days. Every fridays. Take with a full glass of water on an empty stomach. 05/22/17  Yes Fayrene Helper, MD  aspirin EC 81 MG tablet Take 1 tablet (81 mg total) by mouth daily. 05/22/17  Yes Fayrene Helper, MD  calcium-vitamin D (OSCAL-500) 500-400 MG-UNIT tablet Take 1 tablet by mouth 2 (two) times daily. 05/22/17  Yes Fayrene Helper, MD  folic acid (FOLVITE) 462 MCG tablet Take 800 mcg by mouth daily.   Yes [provider]  levothyroxine (SYNTHROID, LEVOTHROID) 25 MCG tablet Take 1 tablet (25 mcg total) by mouth daily before breakfast. 07/25/17  Yes Fayrene Helper, MD  Multiple  Vitamins-Iron (MULTIVITAMIN/IRON PO) Take 1 tablet by mouth daily.   Yes [provider]  polyethylene glycol-electrolytes (TRILYTE) 420 g solution Take 4,000 mLs by mouth as directed. 08/14/17  Yes Satvik Parco, Marga Melnick, MD  vitamin B-12 (CYANOCOBALAMIN) 250 MCG tablet Take 500 mcg by mouth daily.   Yes [provider]  ranitidine (ZANTAC) 300 MG tablet Take 1 tablet (300 mg total) by mouth at bedtime. Patient taking differently: Take 300 mg by mouth daily as needed (indegestion).  05/22/17   Fayrene Helper, MD    Allergies as of 08/14/2017  . (No Known Allergies)    Family History  Problem Relation Age of Onset  . Heart failure Mother   . Cancer Mother 67       oral  . Heart failure Father   . Arthritis Sister   . Heart attack Brother   . Thyroid disease Daughter   . Hypertension Daughter   . Hypertension Daughter   . Thyroid disease Daughter   . Heart failure Sister   . Diabetes Sister   . Hypertension Sister   . Leukemia Brother   . Colon cancer Neg Hx   . Colon polyps Neg Hx     Social History   Social History  . Marital status: Widowed    Spouse name: N/A  . Number of children: 3  . Years of education: N/A   Occupational History  .  retired     part time   . disabled for arthritis      since age 54  .  [employer]   Social History Main Topics  . Smoking status: Former Smoker    Packs/day: 2.00    Years: 25.00    Types: Cigarettes    Quit date: 05/12/2014  . Smokeless tobacco: Never Used  . Alcohol use No  . Drug use: No  . Sexual activity: Not Currently   Other Topics Concern  . Not on file   Social History Narrative   Lives with Granddaughter since 2006    Review of Systems: See HPI, otherwise negative ROS   Physical Exam: BP (!) 158/76   Pulse (!) 57   Temp 97.9 F (36.6 C) (Oral)   Resp 19   SpO2 100%  General:   Alert,  pleasant and cooperative in NAD Head:  Normocephalic and atraumatic. Neck:  Supple; Lungs:  Clear  throughout to auscultation.    Heart:  Regular rate and rhythm. Abdomen:  Soft, nontender and nondistended. Normal bowel sounds, without guarding, and without rebound.   Neurologic:  Alert and  oriented x4;  grossly normal neurologically.  Impression/Plan:   Normocytic ANEMIA.  PLAN: EGD/TCS TODAY  DISCUSSED PROCEDURE, BENEFITS, & RISKS: < 1% chance of medication reaction, bleeding, perforation, or rupture of spleen/liver.

## 2017-09-26 NOTE — Discharge Instructions (Signed)
YOU DID NOT HAVE ANY POLYPS.  You have small internal hemorrhoids and diverticulosis in your right and left colon. YOUR LOW BLOOD COUNT IS DUE TO GASTRITIS FROM TAKING ASPIRIN.  DRINK WATER TO KEEP YOUR URINE LIGHT YELLOW.  FOLLOW A HIGH FIBER DIET. AVOID ITEMS THAT CAUSE BLOATING & GAS. See info below.  TO TREAT GASTRITIS, CONTINUE OMEPRAZOLE.  TAKE 30 MINUTES PRIOR TO YOUR FIRST MEAL.  RECHECK BLOOD COUNT IN JAN 2019.  FOLLOW UP IN FEB 2019. Feb 18th at 9:00am  Colonoscopy Care After Read the instructions outlined below and refer to this sheet in the next week. These discharge instructions provide you with general information on caring for yourself after you leave the hospital. While your treatment has been planned according to the most current medical practices available, unavoidable complications occasionally occur. If you have any problems or questions after discharge, call DR. FIELDS, [phone].  ACTIVITY  You may resume your regular activity, but move at a slower pace for the next 24 hours.   Take frequent rest periods for the next 24 hours.   Walking will help get rid of the air and reduce the bloated feeling in your belly (abdomen).   No driving for 24 hours (because of the medicine (anesthesia) used during the test).   You may shower.   Do not sign any important legal documents or operate any machinery for 24 hours (because of the anesthesia used during the test).    NUTRITION  Drink plenty of fluids.   You may resume your normal diet as instructed by your doctor.   Begin with a light meal and progress to your normal diet. Heavy or fried foods are harder to digest and may make you feel sick to your stomach (nauseated).   Avoid alcoholic beverages for 24 hours or as instructed.    MEDICATIONS  You may resume your normal medications.   WHAT YOU CAN EXPECT TODAY  Some feelings of bloating in the abdomen.   Passage of more gas than usual.   Spotting of  blood in your stool or on the toilet paper  .  IF YOU HAD POLYPS REMOVED DURING THE COLONOSCOPY:  Eat a soft diet IF YOU HAVE NAUSEA, BLOATING, ABDOMINAL PAIN, OR VOMITING.    FINDING OUT THE RESULTS OF YOUR TEST Not all test results are available during your visit. DR. Oneida Alar WILL CALL YOU WITHIN 7 DAYS OF YOUR PROCEDUE WITH YOUR RESULTS. Do not assume everything is normal if you have not heard from DR. FIELDS IN ONE WEEK, CALL HER OFFICE AT [phone].  SEEK IMMEDIATE MEDICAL ATTENTION AND CALL THE OFFICE: [phone] IF:  You have more than a spotting of blood in your stool.   Your belly is swollen (abdominal distention).   You are nauseated or vomiting.   You have a temperature over 101F.   You have abdominal pain or discomfort that is severe or gets worse throughout the day.  High-Fiber Diet A high-fiber diet changes your normal diet to include more whole grains, legumes, fruits, and vegetables. Changes in the diet involve replacing refined carbohydrates with unrefined foods. The calorie level of the diet is essentially unchanged. The Dietary Reference Intake (recommended amount) for adult males is 38 grams per day. For adult females, it is 25 grams per day. Pregnant and lactating women should consume 28 grams of fiber per day. Fiber is the intact part of a plant that is not broken down during digestion. Functional fiber is fiber that has been  isolated from the plant to provide a beneficial effect in the body. PURPOSE  Increase stool bulk.   Ease and regulate bowel movements.   Lower cholesterol.  INDICATIONS THAT YOU NEED MORE FIBER  Constipation and hemorrhoids.   Uncomplicated diverticulosis (intestine condition) and irritable bowel syndrome.   Weight management.   As a protective measure against hardening of the arteries (atherosclerosis), diabetes, and cancer.   GUIDELINES FOR INCREASING FIBER IN THE DIET  Start adding fiber to the diet slowly. A gradual  increase of about 5 more grams (2 slices of whole-wheat bread, 2 servings of most fruits or vegetables, or 1 bowl of high-fiber cereal) per day is best. Too rapid an increase in fiber may result in constipation, flatulence, and bloating.   Drink enough water and fluids to keep your urine clear or pale yellow. Water, juice, or caffeine-free drinks are recommended. Not drinking enough fluid may cause constipation.   Eat a variety of high-fiber foods rather than one type of fiber.   Try to increase your intake of fiber through using high-fiber foods rather than fiber pills or supplements that contain small amounts of fiber.   The goal is to change the types of food eaten. Do not supplement your present diet with high-fiber foods, but replace foods in your present diet.   INCLUDE A VARIETY OF FIBER SOURCES  Replace refined and processed grains with whole grains, canned fruits with fresh fruits, and incorporate other fiber sources. White rice, white breads, and most bakery goods contain little or no fiber.   Brown whole-grain rice, buckwheat oats, and many fruits and vegetables are all good sources of fiber. These include: broccoli, Brussels sprouts, cabbage, cauliflower, beets, sweet potatoes, white potatoes (skin on), carrots, tomatoes, eggplant, squash, berries, fresh fruits, and dried fruits.   Cereals appear to be the richest source of fiber. Cereal fiber is found in whole grains and bran. Bran is the fiber-rich outer coat of cereal grain, which is largely removed in refining. In whole-grain cereals, the bran remains. In breakfast cereals, the largest amount of fiber is found in those with "bran" in their names. The fiber content is sometimes indicated on the label.   You may need to include additional fruits and vegetables each day.   In baking, for 1 cup white flour, you may use the following substitutions:   1 cup whole-wheat flour minus 2 tablespoons.   1/2 cup white flour plus 1/2 cup  whole-wheat flour.   Diverticulosis Diverticulosis is a common condition that develops when small pouches (diverticula) form in the wall of the colon. The risk of diverticulosis increases with age. It happens more often in people who eat a low-fiber diet. Most individuals with diverticulosis have no symptoms. Those individuals with symptoms usually experience belly (abdominal) pain, constipation, or loose stools (diarrhea).  HOME CARE INSTRUCTIONS  Increase the amount of fiber in your diet as directed by your caregiver or dietician. This may reduce symptoms of diverticulosis.   Drink at least 6 to 8 glasses of water each day to prevent constipation.   Try not to strain when you have a bowel movement.   Avoiding nuts and seeds to prevent complications is NOT NECESSARY.      FOODS HAVING HIGH FIBER CONTENT INCLUDE:  Fruits. Apple, peach, pear, tangerine, raisins, prunes.   Vegetables. Brussels sprouts, asparagus, broccoli, cabbage, carrot, cauliflower, romaine lettuce, spinach, summer squash, tomato, winter squash, zucchini.   Starchy Vegetables. Baked beans, kidney beans, lima beans, split peas, lentils,  potatoes (with skin).   Grains. Whole wheat bread, brown rice, bran flake cereal, plain oatmeal, white rice, shredded wheat, bran muffins.    SEEK IMMEDIATE MEDICAL CARE IF:  You develop increasing pain or severe bloating.   You have an oral temperature above 101F.   You develop vomiting or bowel movements that are bloody or black.   Hemorrhoids Hemorrhoids are dilated (enlarged) veins around the rectum. Sometimes clots will form in the veins. This makes them swollen and painful. These are called thrombosed hemorrhoids. Causes of hemorrhoids include:  Constipation.   Straining to have a bowel movement.   HEAVY LIFTING  HOME CARE INSTRUCTIONS  Eat a well balanced diet and drink 6 to 8 glasses of water every day to avoid constipation. You may also use a bulk laxative.    Avoid straining to have bowel movements.   Keep anal area dry and clean.   Do not use a donut shaped pillow or sit on the toilet for long periods. This increases blood pooling and pain.   Move your bowels when your body has the urge; this will require less straining and will decrease pain and pressure.

## 2017-09-26 NOTE — Op Note (Signed)
[facility] Patient Name: Bianca Mcguire Procedure Date: 09/26/2017 7:09 AM MRN: [id number] Date of Birth: [birth year] Attending MD: Barney Drain MD, MD CSN: [id number] Age: 71 Admit Type: Outpatient Procedure:                Colonoscopy, DIAGNOSTIC Indications:              Anemia Providers:                Barney Drain MD, MD, Selena Lesser, Aram Candela Referring MD:              Medicines:                Propofol per Anesthesia Complications:            No immediate complications. Estimated Blood Loss:     Estimated blood loss: none. Procedure:                Pre-Anesthesia Assessment:                           - Prior to the procedure, a History and Physical                            was performed, and patient medications and                            allergies were reviewed. The patient's tolerance of                            previous anesthesia was also reviewed. The risks                            and benefits of the procedure and the sedation                            options and risks were discussed with the patient.                            All questions were answered, and informed consent                            was obtained. Prior Anticoagulants: The patient has                            taken aspirin, last dose was 1 day prior to                            procedure. ASA Grade Assessment: II - A patient                            with mild systemic disease. After reviewing the                            risks and benefits, the patient was deemed in  satisfactory condition to undergo the procedure.                            After obtaining informed consent, the colonoscope                            was passed under direct vision. Throughout the                            procedure, the patient's blood pressure, pulse, and                            oxygen saturations were monitored continuously. The   EC-[id number] ([id number]) scope was introduced through                            the anus and advanced to the the cecum, identified                            by appendiceal orifice and ileocecal valve. The                            colonoscopy was somewhat difficult due to a                            tortuous colon. Successful completion of the                            procedure was aided by COLOWRAP. The patient                            tolerated the procedure well. The quality of the                            bowel preparation was good. The ileocecal valve,                            appendiceal orifice, and rectum were photographed. Scope In: 7:53:33 AM Scope Out: 8:16:58 AM Scope Withdrawal Time: 0 hours 18 minutes 41 seconds  Total Procedure Duration: 0 hours 23 minutes 25 seconds  Findings:      Multiple small and large-mouthed diverticula were found in the       recto-sigmoid colon, sigmoid colon, descending colon, splenic flexure,       transverse colon and hepatic flexure.      The recto-sigmoid colon and sigmoid colon were moderately redundant.      Internal hemorrhoids were found during retroflexion. The hemorrhoids       were small. Impression:               - Diverticulosis in the recto-sigmoid colon, in the                            sigmoid colon, in the descending colon, at the  splenic flexure, in the transverse colon and at the                            hepatic flexure.                           - Redundant colon.                           - Internal hemorrhoids.                           - NO SOURCE FOR ANEMIA IDENTIFIED Moderate Sedation:      Per Anesthesia Care Recommendation:           - High fiber diet.                           - Continue present medications.                           - Return to my office in 4 months.                           - Patient has a contact number available for                            emergencies.  The signs and symptoms of potential                            delayed complications were discussed with the                            patient. Return to normal activities tomorrow.                            Written discharge instructions were provided to the                            patient.                           - No repeat colonoscopy due to age. Procedure Code(s):        --- Professional ---                           [phone], Colonoscopy, flexible; diagnostic, including                            collection of specimen(s) by brushing or washing,                            when performed (separate procedure) Diagnosis Code(s):        --- Professional ---                           Y07.3, Other hemorrhoids  D64.9, Anemia, unspecified                           K57.30, Diverticulosis of large intestine without                            perforation or abscess without bleeding                           Q43.8, Other specified congenital malformations of                            intestine CPT copyright 2016 American Medical Association. All rights reserved. The codes documented in this report are preliminary and upon coder review may  be revised to meet current compliance requirements. Barney Drain, MD Barney Drain MD, MD 09/26/2017 8:35:49 AM This report has been signed electronically. Number of Addenda: 0

## 2017-10-03 ENCOUNTER — Encounter (HOSPITAL_COMMUNITY): Payer: Self-pay | Admitting: Gastroenterology

## 2017-10-30 ENCOUNTER — Ambulatory Visit: Payer: Medicare HMO | Admitting: Family Medicine

## 2017-11-13 ENCOUNTER — Telehealth: Payer: Self-pay | Admitting: Gastroenterology

## 2017-11-13 ENCOUNTER — Encounter: Payer: Self-pay | Admitting: Family Medicine

## 2017-11-13 NOTE — Telephone Encounter (Signed)
January recall for recheck of blood count

## 2017-11-13 NOTE — Telephone Encounter (Signed)
Lab order on file for Jan 2019.

## 2017-11-14 ENCOUNTER — Other Ambulatory Visit: Payer: Self-pay

## 2017-11-14 DIAGNOSIS — D649 Anemia, unspecified: Secondary | ICD-10-CM

## 2017-11-25 ENCOUNTER — Encounter: Payer: Self-pay | Admitting: Gastroenterology

## 2017-12-09 ENCOUNTER — Other Ambulatory Visit: Payer: Self-pay

## 2017-12-09 MED ORDER — ALENDRONATE SODIUM 70 MG PO TABS: 70.0000 mg | ORAL_TABLET | ORAL | 1 refills | Status: DC

## 2017-12-09 MED ORDER — LEVOTHYROXINE SODIUM 25 MCG PO TABS: 25.0000 ug | ORAL_TABLET | Freq: Every day | ORAL | 1 refills | Status: DC

## 2018-01-27 ENCOUNTER — Ambulatory Visit: Payer: Medicare HMO | Admitting: Gastroenterology

## 2018-01-28 ENCOUNTER — Encounter: Payer: Self-pay | Admitting: Gastroenterology

## 2018-01-28 ENCOUNTER — Telehealth: Payer: Self-pay | Admitting: Gastroenterology

## 2018-01-28 ENCOUNTER — Ambulatory Visit: Payer: Medicare HMO | Admitting: Gastroenterology

## 2018-01-28 NOTE — Telephone Encounter (Signed)
Patient no showed and did not have labs done last month as requested. Letter sent as outlined.

## 2018-01-28 NOTE — Telephone Encounter (Signed)
PATIENT WAS A NO SHOW AND LETTER SENT  °

## 2018-03-25 ENCOUNTER — Other Ambulatory Visit: Payer: Self-pay | Admitting: Family Medicine

## 2018-04-21 ENCOUNTER — Telehealth: Payer: Self-pay | Admitting: Family Medicine

## 2018-04-21 ENCOUNTER — Other Ambulatory Visit: Payer: Self-pay

## 2018-04-21 MED ORDER — LEVOTHYROXINE SODIUM 25 MCG PO TABS: ORAL_TABLET | ORAL | 0 refills | Status: DC

## 2018-04-21 MED ORDER — ALENDRONATE SODIUM 70 MG PO TABS: 70.0000 mg | ORAL_TABLET | ORAL | 0 refills | Status: DC

## 2018-04-21 NOTE — Telephone Encounter (Signed)
NO FURTHER REFILL WITHOUT OFFICE VISIT

## 2018-04-21 NOTE — Telephone Encounter (Signed)
Fosamax, synthroid, levothyroxine, alendronate,   RX Refill----[facility], please fax [phone]..  I have schd appt for end of June--and if pt needs labs --please call her.

## 2018-05-25 ENCOUNTER — Other Ambulatory Visit: Payer: Self-pay | Admitting: Family Medicine

## 2018-06-02 ENCOUNTER — Telehealth: Payer: Self-pay | Admitting: Family Medicine

## 2018-06-02 ENCOUNTER — Encounter: Payer: Self-pay | Admitting: Family Medicine

## 2018-06-02 ENCOUNTER — Ambulatory Visit (INDEPENDENT_AMBULATORY_CARE_PROVIDER_SITE_OTHER): Payer: Medicare Other | Admitting: Family Medicine

## 2018-06-02 VITALS — BP 160/80 | HR 72 | Resp 16 | Ht 62.0 in | Wt 161.0 lb

## 2018-06-02 DIAGNOSIS — M25561 Pain in right knee: Secondary | ICD-10-CM

## 2018-06-02 DIAGNOSIS — E559 Vitamin D deficiency, unspecified: Secondary | ICD-10-CM

## 2018-06-02 DIAGNOSIS — G8929 Other chronic pain: Secondary | ICD-10-CM | POA: Diagnosis not present

## 2018-06-02 DIAGNOSIS — Z87891 Personal history of nicotine dependence: Secondary | ICD-10-CM | POA: Diagnosis not present

## 2018-06-02 DIAGNOSIS — F17210 Nicotine dependence, cigarettes, uncomplicated: Secondary | ICD-10-CM

## 2018-06-02 DIAGNOSIS — I10 Essential (primary) hypertension: Secondary | ICD-10-CM | POA: Diagnosis not present

## 2018-06-02 DIAGNOSIS — E785 Hyperlipidemia, unspecified: Secondary | ICD-10-CM

## 2018-06-02 DIAGNOSIS — E039 Hypothyroidism, unspecified: Secondary | ICD-10-CM | POA: Diagnosis not present

## 2018-06-02 DIAGNOSIS — E669 Obesity, unspecified: Secondary | ICD-10-CM | POA: Insufficient documentation

## 2018-06-02 DIAGNOSIS — E663 Overweight: Secondary | ICD-10-CM

## 2018-06-02 DIAGNOSIS — Z1231 Encounter for screening mammogram for malignant neoplasm of breast: Secondary | ICD-10-CM | POA: Diagnosis not present

## 2018-06-02 HISTORY — DX: Essential (primary) hypertension: I10

## 2018-06-02 MED ORDER — AMLODIPINE BESYLATE 2.5 MG PO TABS: 2.5000 mg | ORAL_TABLET | Freq: Every day | ORAL | 3 refills | Status: DC

## 2018-06-02 MED ORDER — OMEPRAZOLE 20 MG PO CPDR: DELAYED_RELEASE_CAPSULE | ORAL | 1 refills | Status: DC

## 2018-06-02 MED ORDER — LEVOTHYROXINE SODIUM 25 MCG PO TABS: ORAL_TABLET | ORAL | 1 refills | Status: DC

## 2018-06-02 MED ORDER — BENZONATATE 100 MG PO CAPS: 100.0000 mg | ORAL_CAPSULE | Freq: Two times a day (BID) | ORAL | 0 refills | Status: DC | PRN

## 2018-06-02 MED ORDER — ALENDRONATE SODIUM 70 MG PO TABS: 70.0000 mg | ORAL_TABLET | ORAL | 1 refills | Status: DC

## 2018-06-02 NOTE — Assessment & Plan Note (Signed)
Updated lab needed at/ before next visit.   

## 2018-06-02 NOTE — Assessment & Plan Note (Signed)
Lung cancer screening with chest scan due in August referral for screening study is entered

## 2018-06-02 NOTE — Progress Notes (Signed)
   Bianca Mcguire     MRN: [id number]      DOB: [birth year]   HPI [name] is here for follow up and re-evaluation of chronic medical conditions, medication management and review of any available recent lab and radiology data.  Preventive health :Needs mammograms and lung cancer screening  . The PT denies any adverse reactions to current medications since the last visit.  Right knee pain and stiffness has increased wants ortho consult, denies falls , uses a cane at times  ROS Denies recent fever or chills. Denies sinus pressure, nasal congestion, ear pain or sore throat.  Denies chest pains, palpitations and leg swelling Denies abdominal pain, nausea, vomiting,diarrhea or constipation.   Denies dysuria, frequency, hesitancy or incontinence. Denies joint pain, swelling and limitation in mobility. Denies headaches, seizures, numbness, or tingling. Denies depression, anxiety or insomnia. Denies skin break down or rash.   PE  BP (!) 160/80   Pulse 72   Resp 16   Ht 5\' 2"  (1.575 m)   Wt 161 lb (73 kg)   SpO2 95%   BMI 29.45 kg/m   Patient alert and oriented and in no cardiopulmonary distress.  HEENT: No facial asymmetry, EOMI,   oropharynx pink and moist.  Neck supple no JVD, no mass.  Chest: Clear to auscultation bilaterally.  CVS: S1, S2 no murmurs, no S3.Regular rate.  ABD: Soft non tender.   Ext: No edema  MS: Adequate ROM spine, shoulders, hips and  Reduced in right knee which is markedly deformed.  Skin: Intact, no ulcerations or rash noted.  Psych: Good eye contact, normal affect. Memory intact not anxious or depressed appearing.  CNS: CN 2-12 intact, power,  normal throughout.no focal deficits noted.   Assessment & Plan  Knee pain, right Increased pain and stiffness, refer to ortho for managemenmt  H/O nicotine dependence Lung cancer screening with chest scan due in August referral for screening study is entered  Adult hypothyroidism Updated lab  needed at/ before next visit.   Essential hypertension satart amlodipine 2.5 mg daily DASH diet and commitment to daily physical activity for a minimum of 30 minutes discussed and encouraged, as a part of hypertension management. The importance of attaining a healthy weight is also discussed.  BP/Weight 06/02/2018 09/26/2017 09/16/2017 08/14/2017 07/25/2017 07/24/2017 08/07/5620  Systolic BP 308 657 846 962 952 841 324  Diastolic BP 80 68 72 85 82 77 83  Wt. (Lbs) 161 161 - 161.4 160.12 160 161  BMI 29.45 29.45 - 29.52 29.29 29.26 29.45       Overweight (BMI 25.0-29.9) unchanged Patient re-educated about  the importance of commitment to a  minimum of 150 minutes of exercise per week.  The importance of healthy food choices with portion control discussed. Encouraged to start a food diary, count calories and to consider  joining a support group. Sample diet sheets offered. Goals set by the patient for the next several months.   Weight /BMI 06/02/2018 09/26/2017 08/14/2017  WEIGHT 161 lb 161 lb 161 lb 6.4 oz  HEIGHT 5\' 2"  5\' 2"  5\' 2"   BMI 29.45 kg/m2 29.45 kg/m2 29.52 kg/m2

## 2018-06-02 NOTE — Telephone Encounter (Signed)
[name], with [facility] is calling to report that PT Bianca Mcguire had 0.70 Left lower extremity, and 0.81right lower extremity. Pt denies systems.

## 2018-06-02 NOTE — Patient Instructions (Addendum)
Annual physical exam with MD last week in August, needs EKG at that visit  Please schedule mammogram at [place] will be referred to dr Aline Brochure re right knee  You will be referred for chest scan  Check about Shingles vaccine at your pharmacy   New  medication today for blood pressure amlodipine one every day , take at same time, suggest 9 pm at night  Reduce salt in diet and increase vegetable and fruit  Decongestant medication at [facility] also  Labs today cBC, lipid, chem 7 and EGFR and Vit D and TSH  Please be careful not to fall  Thank you  for choosing [facility]. We consider it a privelige to serve you.  Delivering excellent health care in a caring and  compassionate way is our goal.  Partnering with you,  so that together we can achieve this goal is our strategy.

## 2018-06-02 NOTE — Assessment & Plan Note (Signed)
Increased pain and stiffness, refer to ortho for managemenmt

## 2018-06-02 NOTE — Assessment & Plan Note (Addendum)
unchanged Patient re-educated about  the importance of commitment to a  minimum of 150 minutes of exercise per week.  The importance of healthy food choices with portion control discussed. Encouraged to start a food diary, count calories and to consider  joining a support group. Sample diet sheets offered. Goals set by the patient for the next several months.   Weight /BMI 06/02/2018 09/26/2017 08/14/2017  WEIGHT 161 lb 161 lb 161 lb 6.4 oz  HEIGHT 5\' 2"  5\' 2"  5\' 2"   BMI 29.45 kg/m2 29.45 kg/m2 29.52 kg/m2

## 2018-06-02 NOTE — Assessment & Plan Note (Signed)
satart amlodipine 2.5 mg daily DASH diet and commitment to daily physical activity for a minimum of 30 minutes discussed and encouraged, as a part of hypertension management. The importance of attaining a healthy weight is also discussed.  BP/Weight 06/02/2018 09/26/2017 09/16/2017 08/14/2017 07/25/2017 07/24/2017 2/90/2111  Systolic BP 552 080 223 361 224 497 530  Diastolic BP 80 68 72 85 82 77 83  Wt. (Lbs) 161 161 - 161.4 160.12 160 161  BMI 29.45 29.45 - 29.52 29.29 29.26 29.45

## 2018-06-03 ENCOUNTER — Other Ambulatory Visit: Payer: Self-pay | Admitting: Family Medicine

## 2018-06-03 LAB — CBC
HEMATOCRIT: 37.9 % (ref 35.0–45.0)
Hemoglobin: 12.2 g/dL (ref 11.7–15.5)
MCH: 27.6 pg (ref 27.0–33.0)
MCHC: 32.2 g/dL (ref 32.0–36.0)
MCV: 85.7 fL (ref 80.0–100.0)
MPV: 9.8 fL (ref 7.5–12.5)
Platelets: 228 10*3/uL (ref 140–400)
RBC: 4.42 10*6/uL (ref 3.80–5.10)
RDW: 14.4 % (ref 11.0–15.0)
WBC: 6.2 10*3/uL (ref 3.8–10.8)

## 2018-06-03 LAB — LIPID PANEL
CHOLESTEROL: 207 mg/dL — AB (ref <200)
HDL: 67 mg/dL (ref >50)
LDL Cholesterol (Calc): 120 mg/dL (calc) — ABNORMAL HIGH
NON-HDL CHOLESTEROL (CALC): 140 mg/dL — AB (ref <130)
Total CHOL/HDL Ratio: 3.1 (calc) (ref <5.0)
Triglycerides: 96 mg/dL (ref <150)

## 2018-06-03 LAB — BASIC METABOLIC PANEL WITH GFR
BUN: 21 mg/dL (ref 7–25)
CHLORIDE: 105 mmol/L (ref 98–110)
CO2: 29 mmol/L (ref 20–32)
Calcium: 9.7 mg/dL (ref 8.6–10.4)
Creat: 0.68 mg/dL (ref 0.60–0.93)
GFR, EST AFRICAN AMERICAN: 102 mL/min/{1.73_m2} (ref >=60)
GFR, Est Non African American: 88 mL/min/{1.73_m2} (ref >=60)
Glucose, Bld: 102 mg/dL — ABNORMAL HIGH (ref 65–99)
Potassium: 4.4 mmol/L (ref 3.5–5.3)
Sodium: 142 mmol/L (ref 135–146)

## 2018-06-03 LAB — TSH: TSH: 5.95 mIU/L — ABNORMAL HIGH (ref 0.40–4.50)

## 2018-06-03 LAB — VITAMIN D 25 HYDROXY (VIT D DEFICIENCY, FRACTURES): VIT D 25 HYDROXY: 21 ng/mL — AB (ref 30–100)

## 2018-06-03 NOTE — Progress Notes (Signed)
Synthroid 

## 2018-06-03 NOTE — Telephone Encounter (Signed)
Pt was seen in the office yesterday.

## 2018-06-16 ENCOUNTER — Ambulatory Visit (HOSPITAL_COMMUNITY)
Admission: RE | Admit: 2018-06-16 | Discharge: 2018-06-16 | Disposition: A | Discharge status: Home / Self Care | Payer: Medicare Other | Source: Ambulatory Visit | Attending: Family Medicine | Admitting: Family Medicine

## 2018-06-16 DIAGNOSIS — Z1231 Encounter for screening mammogram for malignant neoplasm of breast: Secondary | ICD-10-CM | POA: Insufficient documentation

## 2018-06-18 ENCOUNTER — Telehealth: Payer: Self-pay

## 2018-06-18 DIAGNOSIS — R7989 Other specified abnormal findings of blood chemistry: Secondary | ICD-10-CM

## 2018-06-18 NOTE — Telephone Encounter (Signed)
Lab order entered per Dr.Simpson. Order mailed to patient.

## 2018-06-19 ENCOUNTER — Ambulatory Visit: Payer: Medicare Other | Admitting: Orthopaedic Surgery

## 2018-06-19 ENCOUNTER — Ambulatory Visit (INDEPENDENT_AMBULATORY_CARE_PROVIDER_SITE_OTHER): Payer: Medicare Other

## 2018-06-19 ENCOUNTER — Encounter: Payer: Self-pay | Admitting: Orthopaedic Surgery

## 2018-06-19 VITALS — BP 142/77 | HR 72 | Temp 97.4°F | Ht 62.0 in | Wt 161.0 lb

## 2018-06-19 DIAGNOSIS — M25561 Pain in right knee: Secondary | ICD-10-CM | POA: Diagnosis not present

## 2018-06-19 DIAGNOSIS — G8929 Other chronic pain: Secondary | ICD-10-CM

## 2018-06-19 NOTE — Patient Instructions (Addendum)

## 2018-06-19 NOTE — Progress Notes (Signed)
Subjective:    Patient ID: Bianca Mcguire, female    DOB: 1946/01/05, 72 y.o.   MRN: [id number]  HPI She has had right knee pain for some time now.  She has had gradually increasing pain of the right knee over the last three months particularly.  She has no trauma, no falls.  She has no redness.  She has swelling and popping. She has had giving way over the last several weeks.  She cannot walk much secondary to pain.  She uses the buggy at [facility] when she shops.  She has seen Dr. Moshe Cipro about this and was told to see [place].   Review of Systems  HENT: Negative for congestion.   Respiratory: Negative for cough and shortness of breath.   Cardiovascular: Negative for chest pain and leg swelling.  Endocrine: Positive for cold intolerance.  Musculoskeletal: Positive for arthralgias and back pain.  Allergic/Immunologic: Positive for environmental allergies.   For Review of Systems, all other systems reviewed and are negative.  Past Medical History:  Diagnosis Date  . Anemia   . Arthritis   . Carpal tunnel syndrome, bilateral   . Diverticulosis dx 2011  . Headache   . Hypertension 06/02/2018  . Low back pain   . Obesity   . Rheumatoid arthritis(714.0)     Past Surgical History:  Procedure Laterality Date  . bilateral foot surgery    . COLONOSCOPY  2011   Dr. Oneida Alar: 52mm sessile polyp (tubular adenoma), pancolonic diverticulosis  . COLONOSCOPY N/A 09/26/2017   Procedure: COLONOSCOPY;  Surgeon: Danie Binder, MD;  [facility];  Service: Endoscopy;  Laterality: N/A;  7:30am  . ESOPHAGOGASTRODUODENOSCOPY N/A 09/16/2017   Procedure: ESOPHAGOGASTRODUODENOSCOPY (EGD);  Surgeon: Danie Binder, MD;  Location: [facility];  Service: Endoscopy;  Laterality: N/A;  . FLEXIBLE SIGMOIDOSCOPY N/A 09/16/2017   Procedure: FLEXIBLE SIGMOIDOSCOPY;  Surgeon: Danie Binder, MD;  Location: [facility];  Service: Endoscopy;  Laterality: N/A;  . [place]  .  roophorectomy for benign disease  March 2011   Dr. Glo Herring  . tonsillectomy and adenoidctomy in childhood    . TUBAL LIGATION  1977    Current Outpatient Medications on File Prior to Visit  Medication Sig Dispense Refill  . acetaminophen (TYLENOL) 500 MG tablet One twice daily for arthritic pain (Patient taking differently: Take 1,000 mg by mouth every 4 (four) hours as needed for mild pain. One twice daily for arthritic pain) 60 tablet 2  . alendronate (FOSAMAX) 70 MG tablet Take 1 tablet (70 mg total) by mouth every 7 (seven) days. Take with a full glass of water on an empty stomach. 12 tablet 1  . amLODipine (NORVASC) 2.5 MG tablet Take 1 tablet (2.5 mg total) by mouth daily. 30 tablet 3  . aspirin EC 81 MG tablet Take 1 tablet (81 mg total) by mouth daily. 100 tablet 3  . benzonatate (TESSALON) 100 MG capsule Take 1 capsule (100 mg total) by mouth 2 (two) times daily as needed for cough. 14 capsule 0  . Calcium Carb-Cholecalciferol (CALCIUM 600 + D PO) Take 2 tablets by mouth every evening.    . folic acid (FOLVITE) 629 MCG tablet Take 800 mcg by mouth every evening.     Marland Kitchen IRON-B12-VITAMINS PO Take 1 tablet by mouth every evening.    Marland Kitchen levothyroxine (SYNTHROID, LEVOTHROID) 25 MCG tablet One and a half tablets every Monday, Wednesday, Friday and Sunday, and one tablet every Tuesday,  Thursday and Saturday.Take fasting with water and wait 30 minutes before eating 108 tablet 3  . Multiple Vitamins-Iron (MULTIVITAMINS WITH IRON) TABS tablet Take 1 tablet by mouth every evening.    Marland Kitchen omeprazole (PRILOSEC) 20 MG capsule 1 PO 30 MINS PRIOR TO BREAKFAST. 90 capsule 1   No current facility-administered medications on file prior to visit.     Social History   Socioeconomic History  . Marital status: Widowed    Spouse name: Not on file  . Number of children: 3  . Years of education: Not on file  . Highest education level: Not on file  Occupational History  . Occupation: retired    Comment:  part time   . Occupation: disabled for arthritis     Comment: since age 84    Employer: [employer]  Social Needs  . Financial resource strain: Not on file  . Food insecurity:    Worry: Not on file    Inability: Not on file  . Transportation needs:    Medical: Not on file    Non-medical: Not on file  Tobacco Use  . Smoking status: Former Smoker    Packs/day: 2.00    Years: 25.00    Pack years: 50.00    Types: Cigarettes    Last attempt to quit: 05/12/2014    Years since quitting: 4.1  . Smokeless tobacco: Never Used  Substance and Sexual Activity  . Alcohol use: No  . Drug use: No  . Sexual activity: Not Currently  Lifestyle  . Physical activity:    Days per week: Not on file    Minutes per session: Not on file  . Stress: Not on file  Relationships  . Social connections:    Talks on phone: Not on file    Gets together: Not on file    Attends religious service: Not on file    Active member of club or organization: Not on file    Attends meetings of clubs or organizations: Not on file    Relationship status: Not on file  . Intimate partner violence:    Fear of current or ex partner: Not on file    Emotionally abused: Not on file    Physically abused: Not on file    Forced sexual activity: Not on file  Other Topics Concern  . Not on file  Social History Narrative   Lives with Granddaughter since 2006    Family History  Problem Relation Age of Onset  . Heart failure Mother   . Cancer Mother 28       oral  . Heart failure Father   . Arthritis Sister   . Heart attack Brother   . Thyroid disease Daughter   . Hypertension Daughter   . Hypertension Daughter   . Thyroid disease Daughter   . Heart failure Sister   . Diabetes Sister   . Hypertension Sister   . Leukemia Brother   . Colon cancer Neg Hx   . Colon polyps Neg Hx     BP (!) 142/77   Pulse 72   Temp (!) 97.4 F (36.3 C)   Ht 5\' 2"  (1.575 m)   Wt 161 lb (73 kg)   BMI 29.45 kg/m   Body mass index is  29.45 kg/m.     Objective:   Physical Exam  Constitutional: She is oriented to person, place, and time. She appears well-developed and well-nourished.  HENT:  Head: Normocephalic and atraumatic.  Eyes: Pupils are equal,  round, and reactive to light. Conjunctivae and EOM are normal.  Neck: Normal range of motion. Neck supple.  Cardiovascular: Normal rate, regular rhythm and intact distal pulses.  Pulmonary/Chest: Effort normal.  Abdominal: Soft.  Musculoskeletal:       Right knee: She exhibits decreased range of motion, swelling and effusion. Tenderness found. Medial joint line tenderness noted.       Legs: Neurological: She is alert and oriented to person, place, and time. She has normal reflexes. She displays normal reflexes. No cranial nerve deficit. She exhibits normal muscle tone. Coordination normal.  Skin: Skin is warm and dry.  Psychiatric: She has a normal mood and affect. Her behavior is normal. Judgment and thought content normal.    X-rays were done of the right knee, reported separately.      Assessment & Plan:   Encounter Diagnosis  Name Primary?  . Chronic pain of right knee Yes   PROCEDURE NOTE:  The patient requests injections of the right knee , verbal consent was obtained.  The right knee was prepped appropriately after time out was performed.   Sterile technique was observed and injection of 1 cc of Depo-Medrol 40 mg with several cc's of plain xylocaine. Anesthesia was provided by ethyl chloride and a 20-gauge needle was used to inject the knee area. The injection was tolerated well.  A band aid dressing was applied.  The patient was advised to apply ice later today and tomorrow to the injection sight as needed.  I am concerned about a meniscus tear.  I would like to get a MRI of the right knee.  Return after the MRI.  Call if any problem.  Precautions discussed.   Electronically Signed Sanjuana Kava, MD 7/11/20199:20 AM

## 2018-06-23 ENCOUNTER — Ambulatory Visit (HOSPITAL_COMMUNITY)
Admission: RE | Admit: 2018-06-23 | Discharge: 2018-06-23 | Disposition: A | Discharge status: Home / Self Care | Payer: Medicare Other | Source: Ambulatory Visit | Attending: Family Medicine | Admitting: Family Medicine

## 2018-06-23 DIAGNOSIS — F17210 Nicotine dependence, cigarettes, uncomplicated: Secondary | ICD-10-CM

## 2018-06-24 ENCOUNTER — Ambulatory Visit (HOSPITAL_COMMUNITY)
Admission: RE | Admit: 2018-06-24 | Discharge: 2018-06-24 | Disposition: A | Discharge status: Home / Self Care | Payer: Medicare Other | Source: Ambulatory Visit | Attending: Orthopaedic Surgery | Admitting: Orthopaedic Surgery

## 2018-06-24 DIAGNOSIS — M25561 Pain in right knee: Secondary | ICD-10-CM | POA: Insufficient documentation

## 2018-06-24 DIAGNOSIS — G8929 Other chronic pain: Secondary | ICD-10-CM | POA: Insufficient documentation

## 2018-06-24 DIAGNOSIS — S83271A Complex tear of lateral meniscus, current injury, right knee, initial encounter: Secondary | ICD-10-CM | POA: Diagnosis not present

## 2018-06-24 DIAGNOSIS — M1711 Unilateral primary osteoarthritis, right knee: Secondary | ICD-10-CM | POA: Insufficient documentation

## 2018-06-24 DIAGNOSIS — X58XXXA Exposure to other specified factors, initial encounter: Secondary | ICD-10-CM | POA: Diagnosis not present

## 2018-06-26 ENCOUNTER — Encounter: Payer: Self-pay | Admitting: Orthopaedic Surgery

## 2018-06-26 ENCOUNTER — Ambulatory Visit: Payer: Medicare Other | Admitting: Orthopaedic Surgery

## 2018-06-26 VITALS — BP 170/79 | HR 60 | Ht 62.0 in | Wt 158.0 lb

## 2018-06-26 DIAGNOSIS — G8929 Other chronic pain: Secondary | ICD-10-CM

## 2018-06-26 DIAGNOSIS — M25561 Pain in right knee: Secondary | ICD-10-CM | POA: Diagnosis not present

## 2018-06-26 NOTE — Progress Notes (Signed)
Patient Bianca Mcguire, female DOB:[birth year], 72 y.o. RSW:[id number]  Chief Complaint  Patient presents with  . Knee Pain    right     HPI  Bianca Mcguire is a 72 y.o. female who has right knee pain.  She had MRI of the right knee showing: IMPRESSION: 1. Complex tear of the lateral meniscus body with radial and horizontal components. 2. Mild-to-moderate tricompartmental osteoarthritis. 3. Mucoid degeneration of the ACL.  I have explained the findings to her.  I have recommended arthroscopy of the right knee. I have recommended she see Dr. Aline Brochure.  She agrees.  She wants to wait until mid September to have surgery as she has a vacation planned in [place].     Body mass index is 28.9 kg/m.  ROS  Review of Systems  HENT: Negative for congestion.   Respiratory: Negative for cough and shortness of breath.   Cardiovascular: Negative for chest pain and leg swelling.  Endocrine: Positive for cold intolerance.  Musculoskeletal: Positive for arthralgias and back pain.  Allergic/Immunologic: Positive for environmental allergies.  All other systems reviewed and are negative.   All other systems reviewed and are negative.  Past Medical History:  Diagnosis Date  . Anemia   . Arthritis   . Carpal tunnel syndrome, bilateral   . Diverticulosis dx 2011  . Headache   . Hypertension 06/02/2018  . Low back pain   . Obesity   . Rheumatoid arthritis(714.0)     Past Surgical History:  Procedure Laterality Date  . bilateral foot surgery    . COLONOSCOPY  2011   Dr. Oneida Alar: 70mm sessile polyp (tubular adenoma), pancolonic diverticulosis  . COLONOSCOPY N/A 09/26/2017   Procedure: COLONOSCOPY;  Surgeon: Danie Binder, MD;  [facility];  Service: Endoscopy;  Laterality: N/A;  7:30am  . ESOPHAGOGASTRODUODENOSCOPY N/A 09/16/2017   Procedure: ESOPHAGOGASTRODUODENOSCOPY (EGD);  Surgeon: Danie Binder, MD;  [facility];  Service: Endoscopy;  Laterality:  N/A;  . FLEXIBLE SIGMOIDOSCOPY N/A 09/16/2017   Procedure: FLEXIBLE SIGMOIDOSCOPY;  Surgeon: Danie Binder, MD;  [facility];  Service: Endoscopy;  Laterality: N/A;  . [place]  . roophorectomy for benign disease  March 2011   Dr. Glo Herring  . tonsillectomy and adenoidctomy in childhood    . TUBAL LIGATION  1977    Family History  Problem Relation Age of Onset  . Heart failure Mother   . Cancer Mother 54       oral  . Heart failure Father   . Arthritis Sister   . Heart attack Brother   . Thyroid disease Daughter   . Hypertension Daughter   . Hypertension Daughter   . Thyroid disease Daughter   . Heart failure Sister   . Diabetes Sister   . Hypertension Sister   . Leukemia Brother   . Colon cancer Neg Hx   . Colon polyps Neg Hx     Social History Social History   Tobacco Use  . Smoking status: Former Smoker    Packs/day: 2.00    Years: 25.00    Pack years: 50.00    Types: Cigarettes    Last attempt to quit: 05/12/2014    Years since quitting: 4.1  . Smokeless tobacco: Never Used  Substance Use Topics  . Alcohol use: No  . Drug use: No    Not on File  Current Outpatient Medications  Medication Sig Dispense Refill  . acetaminophen (TYLENOL) 500 MG tablet One  twice daily for arthritic pain (Patient taking differently: Take 1,000 mg by mouth every 4 (four) hours as needed for mild pain. One twice daily for arthritic pain) 60 tablet 2  . alendronate (FOSAMAX) 70 MG tablet Take 1 tablet (70 mg total) by mouth every 7 (seven) days. Take with a full glass of water on an empty stomach. 12 tablet 1  . amLODipine (NORVASC) 2.5 MG tablet Take 1 tablet (2.5 mg total) by mouth daily. 30 tablet 3  . aspirin EC 81 MG tablet Take 1 tablet (81 mg total) by mouth daily. 100 tablet 3  . benzonatate (TESSALON) 100 MG capsule Take 1 capsule (100 mg total) by mouth 2 (two) times daily as needed for cough. 14 capsule 0  . Calcium Carb-Cholecalciferol (CALCIUM  600 + D PO) Take 2 tablets by mouth every evening.    . folic acid (FOLVITE) 017 MCG tablet Take 800 mcg by mouth every evening.     Marland Kitchen IRON-B12-VITAMINS PO Take 1 tablet by mouth every evening.    Marland Kitchen levothyroxine (SYNTHROID, LEVOTHROID) 25 MCG tablet One and a half tablets every Monday, Wednesday, Friday and Sunday, and one tablet every Tuesday, Thursday and Saturday.Take fasting with water and wait 30 minutes before eating 108 tablet 3  . Multiple Vitamins-Iron (MULTIVITAMINS WITH IRON) TABS tablet Take 1 tablet by mouth every evening.    Marland Kitchen omeprazole (PRILOSEC) 20 MG capsule 1 PO 30 MINS PRIOR TO BREAKFAST. 90 capsule 1   No current facility-administered medications for this visit.      Physical Exam  Blood pressure (!) 170/79, pulse 60, height 5\' 2"  (1.575 m), weight 158 lb (71.7 kg).  Constitutional: overall normal hygiene, normal nutrition, well developed, normal grooming, normal body habitus. Assistive device:none  Musculoskeletal: gait and station Limp right, muscle tone and strength are normal, no tremors or atrophy is present.  .  Neurological: coordination overall normal.  Deep tendon reflex/nerve stretch intact.  Sensation normal.  Cranial nerves II-XII intact.   Skin:   Normal overall no scars, lesions, ulcers or rashes. No psoriasis.  Psychiatric: Alert and oriented x 3.  Recent memory intact, remote memory unclear.  Normal mood and affect. Well groomed.  Good eye contact.  Cardiovascular: overall no swelling, no varicosities, no edema bilaterally, normal temperatures of the legs and arms, no clubbing, cyanosis and good capillary refill.  Lymphatic: palpation is normal.  Right knee has effusion, crepitus, ROM 0 to 110, pain laterally.  Limp to the right.  NV intact.  All other systems reviewed and are negative   The patient has been educated about the nature of the problem(s) and counseled on treatment options.  The patient appeared to understand what I have discussed  and is in agreement with it.  Encounter Diagnosis  Name Primary?  . Chronic pain of right knee Yes    PLAN Call if any problems.  Precautions discussed.  Continue current medications.   Return to clinic to see Dr. Aline Brochure   Electronically Signed Sanjuana Kava, MD 7/18/20199:14 AM

## 2018-07-01 ENCOUNTER — Other Ambulatory Visit: Payer: Self-pay

## 2018-07-01 MED ORDER — LEVOTHYROXINE SODIUM 25 MCG PO TABS: ORAL_TABLET | ORAL | 3 refills | Status: DC

## 2018-08-04 ENCOUNTER — Encounter: Payer: Self-pay | Admitting: Family Medicine

## 2018-08-04 ENCOUNTER — Other Ambulatory Visit: Payer: Self-pay

## 2018-08-04 ENCOUNTER — Ambulatory Visit (INDEPENDENT_AMBULATORY_CARE_PROVIDER_SITE_OTHER): Payer: Medicare Other | Admitting: Family Medicine

## 2018-08-04 VITALS — BP 138/80 | HR 64 | Resp 12 | Ht 62.0 in | Wt 169.1 lb

## 2018-08-04 DIAGNOSIS — Z0000 Encounter for general adult medical examination without abnormal findings: Secondary | ICD-10-CM

## 2018-08-04 DIAGNOSIS — I10 Essential (primary) hypertension: Secondary | ICD-10-CM | POA: Diagnosis not present

## 2018-08-04 DIAGNOSIS — M818 Other osteoporosis without current pathological fracture: Secondary | ICD-10-CM

## 2018-08-04 DIAGNOSIS — M62830 Muscle spasm of back: Secondary | ICD-10-CM

## 2018-08-04 DIAGNOSIS — Z87891 Personal history of nicotine dependence: Secondary | ICD-10-CM

## 2018-08-04 DIAGNOSIS — Z23 Encounter for immunization: Secondary | ICD-10-CM | POA: Diagnosis not present

## 2018-08-04 MED ORDER — CYCLOBENZAPRINE HCL 5 MG PO TABS: ORAL_TABLET | ORAL | 1 refills | Status: DC

## 2018-08-04 MED ORDER — AMLODIPINE BESYLATE 2.5 MG PO TABS: 2.5000 mg | ORAL_TABLET | Freq: Every day | ORAL | 3 refills | Status: DC

## 2018-08-04 NOTE — Patient Instructions (Addendum)
Wellness with nurse in [place], end , this is past due Pls get the thyroid function test 1 week before wellness visit   MD f/u in 6 montth  You need and tests are being ordered for Chest scan and bone density follow , we will call with appointments  Flu vaccine today  EKG shows no sign of enlarged heart or heart damage, continue amlodipine 2.5 mg  Muscle relaxant for as needed use is sent to local pharmacy  Best with knee surgery  You may stop baby aspirin based on new guidelines

## 2018-08-04 NOTE — Progress Notes (Signed)
    Bianca Mcguire     MRN: [id number]      DOB: January 10, 1946  HPI: Patient is in for annual physical exam. C/o back spasms , and requests medication for this Needs EKG for evaluation as recently started on antihypertensives, and she had none in past 3 years Immunization is reviewed , and  updated   PE: BP 138/80 (BP Location: Left Arm, Patient Position: Sitting, Cuff Size: Large)   Pulse 64   Resp 12   Ht 5\' 2"  (1.575 m)   Wt 169 lb 1.3 oz (76.7 kg)   SpO2 95% Comment: room air  BMI 30.93 kg/m   Pleasant  female, alert and oriented x 3, in no cardio-pulmonary distress. Afebrile. HEENT No facial trauma or asymetry. Sinuses non tender.  Extra occullar muscles intact,  External ears normal, tympanic membranes clear. Oropharynx moist, no exudate. Neck: decreased though adequate ROM, no adenopathy,JVD or thyromegaly.No bruits.  Chest: Clear to ascultation bilaterally.No crackles or wheezes. Non tender to palpation  Breast: No asymetry,no masses or lumps. No tenderness. No nipple discharge or inversion. No axillary or supraclavicular adenopathy  Cardiovascular system; Heart sounds normal,  S1 and  S2 ,no S3.  No murmur, or thrill. Apical beat not displaced Peripheral pulses normal. EKG; sinus bradycardia, rate 56, no ischemia or LVH noted  Abdomen: Soft, non tender, no organomegaly or masses. No bruits. Bowel sounds normal. No guarding, tenderness or rebound.    GU: Asymptomatic , no exa indicated or performed Musculoskeletal exam: Decreased  ROM of lumbar spine, wirh spasm, adequate in hips , shoulders and knees.  deformity and  crepitus noted.in knees No muscle wasting or atrophy.   Neurologic: Cranial nerves 2 to 12 intact. Power, tone ,sensation and reflexes normal throughout. Abnormal gaitt. No tremor.  Skin: Intact, no ulceration, erythema , scaling or rash noted. Pigmentation normal throughout  Psych; Normal mood and affect. Judgement and  concentration normal   Assessment & Plan:  Annual physical exam Annual exam as documented. Counseling done  re healthy lifestyle involving commitment to 150 minutes exercise per week, heart healthy diet, and attaining healthy weight.The importance of adequate sleep also discussed. Regular seat belt use and home safety, is also discussed. Changes in health habits are decided on by the patient with goals and time frames  set for achieving them. Immunization and cancer screening needs are specifically addressed at this visit.   Essential hypertension Controlled, no change in medication DASH diet and commitment to daily physical activity for a minimum of 30 minutes discussed and encouraged, as a part of hypertension management. The importance of attaining a healthy weight is also discussed.  BP/Weight 08/04/2018 06/26/2018 06/19/2018 06/02/2018 09/26/2017 64/05/8031 12/11/2480  Systolic BP 500 370 488 891 694 503 888  Diastolic BP 80 79 77 80 68 72 85  Wt. (Lbs) 169.08 158 161 161 161 - 161.4  BMI 30.93 28.9 29.45 29.45 29.45 - 29.52   EkG: no lVH or ischemia , sinus bradycardia , rate 56    Back spasm Sym,ptomatic and has benefited from low dose flexeril, will prescribe same, cautioned re increased fall risk, judicious use only Back strengthening exercise and back hygiene discussed and also use of topical pain relievers which are OTC like Bengay  Need for immunization against influenza After obtaining informed consent, the vaccine is  administered by LPN.   H/O nicotine dependence updtaed lung cancer screening needed, chest scan ordered  Osteoporosis Updated dexa needed and is ordered

## 2018-08-11 ENCOUNTER — Encounter: Payer: Self-pay | Admitting: Family Medicine

## 2018-08-11 DIAGNOSIS — M62830 Muscle spasm of back: Secondary | ICD-10-CM | POA: Insufficient documentation

## 2018-08-11 DIAGNOSIS — Z23 Encounter for immunization: Secondary | ICD-10-CM | POA: Insufficient documentation

## 2018-08-11 NOTE — Assessment & Plan Note (Signed)
updtaed lung cancer screening needed, chest scan ordered

## 2018-08-11 NOTE — Assessment & Plan Note (Signed)
Updated dexa needed and is ordered

## 2018-08-11 NOTE — Assessment & Plan Note (Signed)

## 2018-08-11 NOTE — Assessment & Plan Note (Signed)
Controlled, no change in medication DASH diet and commitment to daily physical activity for a minimum of 30 minutes discussed and encouraged, as a part of hypertension management. The importance of attaining a healthy weight is also discussed.  BP/Weight 08/04/2018 06/26/2018 06/19/2018 06/02/2018 09/26/2017 82/0/6015 05/10/5378  Systolic BP 432 761 470 929 574 734 037  Diastolic BP 80 79 77 80 68 72 85  Wt. (Lbs) 169.08 158 161 161 161 - 161.4  BMI 30.93 28.9 29.45 29.45 29.45 - 29.52   EkG: no lVH or ischemia , sinus bradycardia , rate 56

## 2018-08-11 NOTE — Assessment & Plan Note (Signed)
After obtaining informed consent, the vaccine is  administered by LPN.  

## 2018-08-11 NOTE — Assessment & Plan Note (Signed)
Sym,ptomatic and has benefited from low dose flexeril, will prescribe same, cautioned re increased fall risk, judicious use only Back strengthening exercise and back hygiene discussed and also use of topical pain relievers which are OTC like Bengay

## 2018-08-18 ENCOUNTER — Ambulatory Visit (HOSPITAL_COMMUNITY): Admission: RE | Admit: 2018-08-18 | Payer: Medicare Other | Source: Ambulatory Visit

## 2018-08-26 ENCOUNTER — Telehealth: Payer: Self-pay | Admitting: Family Medicine

## 2018-08-26 NOTE — Telephone Encounter (Signed)
LVM  That the Bone Density scan is scheduled at [facility] on Mon 9-30 @ 10:45

## 2018-09-08 ENCOUNTER — Encounter: Payer: Self-pay | Admitting: Orthopedic Surgery

## 2018-09-08 ENCOUNTER — Ambulatory Visit (HOSPITAL_COMMUNITY): Payer: Medicare Other

## 2018-09-08 ENCOUNTER — Ambulatory Visit: Payer: Medicare Other | Admitting: Orthopedic Surgery

## 2018-09-08 VITALS — BP 126/75 | HR 83 | Ht 62.0 in | Wt 171.0 lb

## 2018-09-08 DIAGNOSIS — M23300 Other meniscus derangements, unspecified lateral meniscus, right knee: Secondary | ICD-10-CM

## 2018-09-08 DIAGNOSIS — M1710 Unilateral primary osteoarthritis, unspecified knee: Secondary | ICD-10-CM | POA: Diagnosis not present

## 2018-09-08 NOTE — Patient Instructions (Signed)
Meniscus Injury, Arthroscopy Arthroscopy is a surgical procedure that involves the use of a small scope that has a camera and surgical instruments on the end (arthroscope). An arthroscope can be used to repair your meniscus injury.  LET [facility] CARE PROVIDER KNOW ABOUT:  Any allergies you have.  All medicines you are taking, including vitamins, herbs, eyedrops, creams, and over-the-counter medicines.  Any recent colds or infections you have had or currently have.  Previous problems you or members of your family have had with the use of anesthetics.  Any blood disorders or blood clotting problems you have.  Previous surgeries you have had.  Medical conditions you have. RISKS AND COMPLICATIONS Generally, this is a safe procedure. However, as with any procedure, problems can occur. Possible problems include:  Damage to nerves or blood vessels.  Excess bleeding.  Blood clots.  Infection. BEFORE THE PROCEDURE  Do not eat or drink for 6-8 hours before the procedure.  Take medicines as directed by your surgeon. Ask your surgeon about changing or stopping your regular medicines.  You may have lab tests the morning of surgery. PROCEDURE  You will be given one of the following:   A medicine that numbs the area (local anesthesia).  A medicine that makes you go to sleep (general anesthesia).  A medicine injected into your spine that numbs your body below the waist (spinal anesthesia). Most often, several small cuts (incisions) are made in the knee. The arthroscope and instruments go into the incisions to repair the damage. The torn portion of the meniscus is removed.   AFTER THE PROCEDURE  You will be taken to the recovery area where your progress will be monitored. When you are awake, stable, and taking fluids without complications, you will be allowed to go home. This is usually the same day. A torn or stretched ligament (ligament sprain) may take 6-8 weeks to heal.   It  takes about the 4-6 WEEKS if your surgeon removed a torn meniscus.  A repaired meniscus may require 6-12 weeks of recovery time.  A torn ligament needing reconstructive surgery may take 6-12 months to heal fully.   This information is not intended to replace advice given to you by your health care provider. Make sure you discuss any questions you have with your health care provider. You have decided to proceed with operative arthroscopy of the knee. You have decided not to continue with nonoperative measures such as but not limited to oral medication, weight loss, activity modification, physical therapy, bracing, or injection.  We will perform operative arthroscopy of the knee. Some of the risks associated with arthroscopic surgery of the knee include but are not limited to Bleeding Infection Swelling Stiffness Blood clot Pain  If you're not comfortable with these risks and would like to continue with nonoperative treatment please let Dr. Aline Brochure know prior to your surgery.   Document Released: 11/23/2000 Document Revised: 12/01/2013 Document Reviewed: 04/24/2013 Elsevier Interactive Patient Education 2016 [place] have decided to proceed with operative arthroscopy of the knee. You have decided not to continue with nonoperative measures such as but not limited to oral medication, weight loss, activity modification, physical therapy, bracing, or injection.  In compliance with recent [place] law in federal regulation regarding opioid use and abuse and addiction, we will taper (stop) opioid medication after 2 weeks.  We will perform operative arthroscopy of the knee. Some of the risks associated with arthroscopic surgery of the knee include but are not limited to  Bleeding Infection Swelling Stiffness Blood clot Pain  If you're not comfortable with these risks and would like to continue with nonoperative treatment please let Dr. Aline Brochure know prior to your surgery.

## 2018-09-08 NOTE — Progress Notes (Signed)
PREOP CONSULT/REFERRAL INTRA-OFFICE FROM DR Tyrone Apple   Chief Complaint  Patient presents with  . Knee Pain    Right knee    72 years old female failed conservative treatment for lateral meniscal tear.  Please see Dr. Brooke Bonito prior notes  Patient complains of pain lateral joint line right knee x several months.  She says the knee will give way or feel like it wants to give way at times.  She complains of dull aching pain moderate denies clicking locking catching     Review of Systems  Constitutional: Negative for chills and fever.  Musculoskeletal: Positive for joint pain.  Neurological: Negative for tingling, speech change, focal weakness and weakness.  All other systems reviewed and are negative.    Past Medical History:  Diagnosis Date  . Anemia   . Arthritis   . Carpal tunnel syndrome, bilateral   . Diverticulosis dx 2011  . Headache   . Hypertension 06/02/2018  . Low back pain   . Obesity   . Rheumatoid arthritis(714.0)     Past Surgical History:  Procedure Laterality Date  . bilateral foot surgery    . COLONOSCOPY  2011   Dr. Oneida Alar: 45mm sessile polyp (tubular adenoma), pancolonic diverticulosis  . COLONOSCOPY N/A 09/26/2017   Procedure: COLONOSCOPY;  Surgeon: Danie Binder, MD;  [facility];  Service: Endoscopy;  Laterality: N/A;  7:30am  . ESOPHAGOGASTRODUODENOSCOPY N/A 09/16/2017   Procedure: ESOPHAGOGASTRODUODENOSCOPY (EGD);  Surgeon: Danie Binder, MD;  [facility];  Service: Endoscopy;  Laterality: N/A;  . FLEXIBLE SIGMOIDOSCOPY N/A 09/16/2017   Procedure: FLEXIBLE SIGMOIDOSCOPY;  Surgeon: Danie Binder, MD;  [facility];  Service: Endoscopy;  Laterality: N/A;  . [place]  . roophorectomy for benign disease  March 2011   Dr. Glo Herring  . tonsillectomy and adenoidctomy in childhood    . TUBAL LIGATION  1977    Family History  Problem Relation Age of Onset  . Heart failure Mother   . Cancer  Mother 37       oral  . Heart failure Father   . Arthritis Sister   . Heart attack Brother   . Thyroid disease Daughter   . Hypertension Daughter   . Hypertension Daughter   . Thyroid disease Daughter   . Heart failure Sister   . Diabetes Sister   . Hypertension Sister   . Leukemia Brother   . Colon cancer Neg Hx   . Colon polyps Neg Hx    Social History   Tobacco Use  . Smoking status: Former Smoker    Packs/day: 2.00    Years: 25.00    Pack years: 50.00    Types: Cigarettes    Last attempt to quit: 05/12/2014    Years since quitting: 4.3  . Smokeless tobacco: Never Used  Substance Use Topics  . Alcohol use: No  . Drug use: No    No Known Allergies   Current Meds  Medication Sig  . acetaminophen (TYLENOL) 500 MG tablet One twice daily for arthritic pain (Patient taking differently: Take 1,000 mg by mouth every 4 (four) hours as needed for mild pain. One twice daily for arthritic pain)  . alendronate (FOSAMAX) 70 MG tablet Take 1 tablet (70 mg total) by mouth every 7 (seven) days. Take with a full glass of water on an empty stomach.  Marland Kitchen amLODipine (NORVASC) 2.5 MG tablet Take 1 tablet (2.5 mg total) by mouth daily.  Marland Kitchen  Calcium Carb-Cholecalciferol (CALCIUM 600 + D PO) Take 2 tablets by mouth every evening.  . cyclobenzaprine (FLEXERIL) 5 MG tablet One tablet at bedtime as needed for back spasm  . folic acid (FOLVITE) 381 MCG tablet Take 800 mcg by mouth every evening.   Marland Kitchen IRON-B12-VITAMINS PO Take 1 tablet by mouth every evening.  Marland Kitchen levothyroxine (SYNTHROID, LEVOTHROID) 25 MCG tablet One and a half tablets every Monday, Wednesday, Friday and Sunday, and one tablet every Tuesday, Thursday and Saturday.Take fasting with water and wait 30 minutes before eating  . Multiple Vitamins-Iron (MULTIVITAMINS WITH IRON) TABS tablet Take 1 tablet by mouth every evening.  Marland Kitchen omeprazole (PRILOSEC) 20 MG capsule 1 PO 30 MINS PRIOR TO BREAKFAST.    BP 126/75   Pulse 83   Ht 5\' 2"  (1.575  m)   Wt 171 lb (77.6 kg)   BMI 31.28 kg/m   Physical Exam  Constitutional: She is oriented to person, place, and time. She appears well-developed and well-nourished.  Musculoskeletal:       Right knee: She exhibits no effusion.       Left knee: She exhibits no effusion.  Neurological: She is alert and oriented to person, place, and time.  Psychiatric: She has a normal mood and affect. Judgment normal.  Vitals reviewed.   Right Knee Exam   Muscle Strength  The patient has normal right knee strength.  Tenderness  The patient is experiencing tenderness in the lateral joint line and medial joint line.  Range of Motion  Extension: normal  Flexion:  100 normal   Tests  McMurray:  Medial - negative Lateral - positive Varus: negative Valgus: negative Drawer:  Anterior - negative    Posterior - negative  Other  Erythema: absent Scars: absent Sensation: normal Pulse: present Swelling: none Effusion: no effusion present  Comments:  Gait alteration antalgic  right knee gait   Left Knee Exam   Muscle Strength  The patient has normal left knee strength.  Tenderness  The patient is experiencing tenderness in the lateral joint line and medial joint line.  Range of Motion  Extension: normal  Flexion:  110 normal   Tests  McMurray:  Medial - negative Lateral - negative Varus: negative Valgus: negative Drawer:  Anterior - negative     Posterior - negative  Other  Erythema: absent Scars: absent Sensation: normal Pulse: present Swelling: none Effusion: no effusion present      MEDICAL DECISION SECTION  Imaging Personal interpretation  xrays valgus alignment oa all 3 compartments   Mri  All 3 compartments oa with lateral meniscus tear   Encounter Diagnoses  Name Primary?  . Primary localized osteoarthritis of knee Yes  . Meniscus, lateral, derangement, right      PLAN:   Surgical procedure planned: SARK LATERAL MENISECTOMY  The procedure has been  fully reviewed with the patient; The risks and benefits of surgery have been discussed and explained and understood. Alternative treatment has also been reviewed, questions were encouraged and answered. The postoperative plan is also been reviewed.  She has been advised about the arthritis in the knee and the likelihood of persistent discomfort in the right knee  The procedure has been fully reviewed with the patient; The risks and benefits of surgery have been discussed and explained and understood. Alternative treatment has also been reviewed, questions were encouraged and answered. The postoperative plan is also been reviewed.  Nonsurgical treatment as described in the history and physical section was attempted and unsuccessful  and the patient has agreed to proceed with surgical intervention to improve their situation.  No orders of the defined types were placed in this encounter.   Arther Abbott, MD 09/08/2018 9:06 AM

## 2018-10-06 NOTE — Patient Instructions (Signed)
Bianca Mcguire  10/06/2018     @PREFPERIOPPHARMACY @   Your procedure is scheduled on  10/16/2018 .  Report to [facility] at  615   A.M.  Call this number if you have problems the morning of surgery:  [phone]   Remember:  Do not eat or drink after midnight.                        Take these medicines the morning of surgery with A SIP OF WATER  Amlodipine, levothyroxine, prilosec.    Do not wear jewelry, make-up or nail polish.  Do not wear lotions, powders, or perfumes, or deodorant.  Do not shave 48 hours prior to surgery.  Men may shave face and neck.  Do not bring valuables to the hospital.  [facility] is not responsible for any belongings or valuables.  Contacts, dentures or bridgework may not be worn into surgery.  Leave your suitcase in the car.  After surgery it may be brought to your room.  For patients admitted to the hospital, discharge time will be determined by your treatment team.  Patients discharged the day of surgery will not be allowed to drive home.   Name and phone number of your driver:   family Special instructions:  None  Please read over the following fact sheets that you were given. Anesthesia Post-op Instructions and Care and Recovery After Surgery      Knee Ligament Injury, Arthroscopy Arthroscopy is a surgical technique in which your health care provider examines your knee through a small, pencil-sized telescope (arthroscope). Often, repairs to injured ligaments can be done with instruments in the arthroscope. Arthroscopy is less invasive than open-knee surgery. Tell a health care provider about:  Any allergies you have.  All medicines you are taking, including vitamins, herbs, eye drops, creams, and over-the-counter medicines.  Any problems you or family members have had with anesthetic medicines.  Any blood disorders you have.  Any surgeries you have had.  Any medical conditions you have. What are the  risks? Generally, this is a safe procedure. However, as with any procedure, problems can occur. Possible problems include:  Infection.  Bleeding.  Stiffness.  What happens before the procedure?  Ask your health care provider about changing or stopping any regular medicines. Avoid taking aspirin or blood thinners as directed by your health care provider.  Do not eat or drink anything after midnight the night before surgery.  If you smoke, do not smoke for at least 2 weeks before your surgery.  Do not drink alcohol starting the day before your surgery.  Let your health care provider know if you develop a cold or any infection before your surgery.  Arrange for someone to drive you home after the surgery or after your hospital stay. Also arrange for someone to help you with activities during recovery. What happens during the procedure?  Small monitors will be put on your body. They are used to check your heart, blood pressure, and oxygen levels.  An IV access tube will be put into one of your veins. Medicine will be able to flow directly into your body through this IV tube.  You might be given a medicine to help you relax (sedative).  You will be given a medicine that makes you go to sleep (general anesthetic), and a breathing tube will be placed into your lungs during the procedure.  Several small incisions are made in your knee. Saline fluid is placed into one of the incisions to expand the knee and clear away any blood in the knee.  Your health care provider will insert the arthroscope to examine the injured knee.  During arthroscopy, your health care provider may find a partial or complete tear in a ligament.  Tools can be inserted through the other incisions to repair the injured ligaments.  The incisions are then closed with absorbable stitches and covered with dressings. What happens after the procedure?  You will be taken to the recovery area where you will be  monitored.  When you are awake, stable, and taking fluids without problems, you will be allowed to go home. This information is not intended to replace advice given to you by your health care provider. Make sure you discuss any questions you have with your health care provider. Document Released: 11/23/2000 Document Revised: 05/03/2016 Document Reviewed: 07/08/2013 Elsevier Interactive Patient Education  2017 [place].  Arthroscopic Knee Ligament Repair, Care After This sheet gives you information about how to care for yourself after your procedure. Your health care provider may also give you more specific instructions. If you have problems or questions, contact your health care provider. What can I expect after the procedure? After the procedure, it is common to have:  Pain in your knee.  Bruising and swelling on your knee, calf, and ankle for 3-4 days.  Fatigue.  Follow these instructions at home: If you have a brace or immobilizer:  Wear the brace or immobilizer as told by your health care provider. Remove it only as told by your health care provider.  Loosen the splint or immobilizer if your toes tingle, become numb, or turn cold and blue.  Keep the brace or immobilizer clean. Bathing  Do not take baths, swim, or use a hot tub until your health care provider approves. Ask your health care provider if you can take showers.  Keep your bandage (dressing) dry until your health care provider says that it can be removed. Cover it and your brace or immobilizer with a watertight covering when you take a shower. Incision care  Follow instructions from your health care provider about how to take care of your incision. Make sure you: ? Wash your hands with soap and water before you change your bandage (dressing). If soap and water are not available, use hand sanitizer. ? Change your dressing as told by your health care provider. ? Leave stitches (sutures), skin glue, or adhesive  strips in place. These skin closures may need to stay in place for 2 weeks or longer. If adhesive strip edges start to loosen and curl up, you may trim the loose edges. Do not remove adhesive strips completely unless your health care provider tells you to do that.  Check your incision area every day for signs of infection. Check for: ? More redness, swelling, or pain. ? More fluid or blood. ? Warmth. ? Pus or a bad smell. Managing pain, stiffness, and swelling  If directed, put ice on the affected area. ? If you have a removable brace or immobilizer, remove it as told by your health care provider. ? Put ice in a plastic bag. ? Place a towel between your skin and the bag or between your brace or immobilizer and the bag. ? Leave the ice on for 20 minutes, 2-3 times a day.  Move your toes often to avoid stiffness and to lessen swelling.  Raise (  elevate) the injured area above the level of your heart while you are sitting or lying down. Driving  Do not drive until your health care provider approves. If you have a brace or immobilizer on your leg, ask your health care provider when it is safe for you to drive.  Do not drive or use heavy machinery while taking prescription pain medicine. Activity  Rest as directed. Ask your health care provider what activities are safe for you.  Do physical therapy exercises as told by your health care provider. Physical therapy will help you regain strength and motion in your knee.  Follow instructions from your health care provider about: ? When you may start motion exercises. ? When you may start riding a stationary bike and doing other low-impact activities. ? When you may start to jog and do other high-impact activities. Safety  Do not use the injured limb to support your body weight until your health care provider says that you can. Use crutches as told by your health care provider. General instructions  Do not use any products that contain  nicotine or tobacco, such as cigarettes and e-cigarettes. These can delay bone healing. If you need help quitting, ask your health care provider.  To prevent or treat constipation while you are taking prescription pain medicine, your health care provider may recommend that you: ? Drink enough fluid to keep your urine clear or pale yellow. ? Take over-the-counter or prescription medicines. ? Eat foods that are high in fiber, such as fresh fruits and vegetables, whole grains, and beans. ? Limit foods that are high in fat and processed sugars, such as fried and sweet foods.  Take over-the-counter and prescription medicines only as told by your health care provider.  Keep all follow-up visits as told by your health care provider. This is important. Contact a health care provider if:  You have more redness, swelling, or pain around an incision.  You have more fluid or blood coming from an incision.  Your incision feels warm to the touch.  You have a fever.  You have pain or swelling in your knee, and it gets worse.  You have pain that does not get better with medicine. Get help right away if:  You have trouble breathing.  You have pus or a bad smell coming from an incision.  You have numbness and tingling near the knee joint. Summary  After the procedure, it is common to have knee pain with bruising and swelling on your knee, calf, and ankle.  Icing your knee and raising your leg above the level of your heart will help control the pain and the swelling.  Do physical therapy exercises as told by your health care provider. Physical therapy will help you regain strength and motion in your knee. This information is not intended to replace advice given to you by your health care provider. Make sure you discuss any questions you have with your health care provider. Document Released: 09/16/2013 Document Revised: 11/20/2016 Document Reviewed: 11/20/2016 Elsevier Interactive Patient  Education  2017 [place] Anesthesia, Adult General anesthesia is the use of medicines to make a person "go to sleep" (be unconscious) for a medical procedure. General anesthesia is often recommended when a procedure:  Is long.  Requires you to be still or in an unusual position.  Is major and can cause you to lose blood.  Is impossible to do without general anesthesia.  The medicines used for general anesthesia are called general  anesthetics. In addition to making you sleep, the medicines:  Prevent pain.  Control your blood pressure.  Relax your muscles.  Tell a health care provider about:  Any allergies you have.  All medicines you are taking, including vitamins, herbs, eye drops, creams, and over-the-counter medicines.  Any problems you or family members have had with anesthetic medicines.  Types of anesthetics you have had in the past.  Any bleeding disorders you have.  Any surgeries you have had.  Any medical conditions you have.  Any history of heart or lung conditions, such as heart failure, sleep apnea, or chronic obstructive pulmonary disease (COPD).  Whether you are pregnant or may be pregnant.  Whether you use tobacco, alcohol, marijuana, or street drugs.  Any history of Armed forces logistics/support/administrative officer.  Any history of depression or anxiety. What are the risks? Generally, this is a safe procedure. However, problems may occur, including:  Allergic reaction to anesthetics.  Lung and heart problems.  Inhaling food or liquids from your stomach into your lungs (aspiration).  Injury to nerves.  Waking up during your procedure and being unable to move (rare).  Extreme agitation or a state of mental confusion (delirium) when you wake up from the anesthetic.  Air in the bloodstream, which can lead to stroke.  These problems are more likely to develop if you are having a major surgery or if you have an advanced medical condition. You can prevent some of  these complications by answering all of your health care provider's questions thoroughly and by following all pre-procedure instructions. General anesthesia can cause side effects, including:  Nausea or vomiting  A sore throat from the breathing tube.  Feeling cold or shivery.  Feeling tired, washed out, or achy.  Sleepiness or drowsiness.  Confusion or agitation.  What happens before the procedure? Staying hydrated Follow instructions from your health care provider about hydration, which may include:  Up to 2 hours before the procedure - you may continue to drink clear liquids, such as water, clear fruit juice, black coffee, and plain tea.  Eating and drinking restrictions Follow instructions from your health care provider about eating and drinking, which may include:  8 hours before the procedure - stop eating heavy meals or foods such as meat, fried foods, or fatty foods.  6 hours before the procedure - stop eating light meals or foods, such as toast or cereal.  6 hours before the procedure - stop drinking milk or drinks that contain milk.  2 hours before the procedure - stop drinking clear liquids.  Medicines  Ask your health care provider about: ? Changing or stopping your regular medicines. This is especially important if you are taking diabetes medicines or blood thinners. ? Taking medicines such as aspirin and ibuprofen. These medicines can thin your blood. Do not take these medicines before your procedure if your health care provider instructs you not to. ? Taking new dietary supplements or medicines. Do not take these during the week before your procedure unless your health care provider approves them.  If you are told to take a medicine or to continue taking a medicine on the day of the procedure, take the medicine with sips of water. General instructions   Ask if you will be going home the same day, the following day, or after a longer hospital stay. ? Plan to  have someone take you home. ? Plan to have someone stay with you for the first 24 hours after you leave the hospital or  clinic.  For 3-6 weeks before the procedure, try not to use any tobacco products, such as cigarettes, chewing tobacco, and e-cigarettes.  You may brush your teeth on the morning of the procedure, but make sure to spit out the toothpaste. What happens during the procedure?  You will be given anesthetics through a mask and through an IV tube in one of your veins.  You may receive medicine to help you relax (sedative).  As soon as you are asleep, a breathing tube may be used to help you breathe.  An anesthesia specialist will stay with you throughout the procedure. He or she will help keep you comfortable and safe by continuing to give you medicines and adjusting the amount of medicine that you get. He or she will also watch your blood pressure, pulse, and oxygen levels to make sure that the anesthetics do not cause any problems.  If a breathing tube was used to help you breathe, it will be removed before you wake up. The procedure may vary among health care providers and hospitals. What happens after the procedure?  You will wake up, often slowly, after the procedure is complete, usually in a recovery area.  Your blood pressure, heart rate, breathing rate, and blood oxygen level will be monitored until the medicines you were given have worn off.  You may be given medicine to help you calm down if you feel anxious or agitated.  If you will be going home the same day, your health care provider may check to make sure you can stand, drink, and urinate.  Your health care providers will treat your pain and side effects before you go home.  Do not drive for 24 hours if you received a sedative.  You may: ? Feel nauseous and vomit. ? Have a sore throat. ? Have mental slowness. ? Feel cold or shivery. ? Feel sleepy. ? Feel tired. ? Feel sore or achy, even in parts of your  body where you did not have surgery. This information is not intended to replace advice given to you by your health care provider. Make sure you discuss any questions you have with your health care provider. Document Released: 03/04/2008 Document Revised: 05/08/2016 Document Reviewed: 11/10/2015 Elsevier Interactive Patient Education  2018 [place] Anesthesia, Adult, Care After These instructions provide you with information about caring for yourself after your procedure. Your health care provider may also give you more specific instructions. Your treatment has been planned according to current medical practices, but problems sometimes occur. Call your health care provider if you have any problems or questions after your procedure. What can I expect after the procedure? After the procedure, it is common to have:  Vomiting.  A sore throat.  Mental slowness.  It is common to feel:  Nauseous.  Cold or shivery.  Sleepy.  Tired.  Sore or achy, even in parts of your body where you did not have surgery.  Follow these instructions at home: For at least 24 hours after the procedure:  Do not: ? Participate in activities where you could fall or become injured. ? Drive. ? Use heavy machinery. ? Drink alcohol. ? Take sleeping pills or medicines that cause drowsiness. ? Make important decisions or sign legal documents. ? Take care of children on your own.  Rest. Eating and drinking  If you vomit, drink water, juice, or soup when you can drink without vomiting.  Drink enough fluid to keep your urine clear or pale yellow.  Make  sure you have little or no nausea before eating solid foods.  Follow the diet recommended by your health care provider. General instructions  Have a responsible adult stay with you until you are awake and alert.  Return to your normal activities as told by your health care provider. Ask your health care provider what activities are safe for  you.  Take over-the-counter and prescription medicines only as told by your health care provider.  If you smoke, do not smoke without supervision.  Keep all follow-up visits as told by your health care provider. This is important. Contact a health care provider if:  You continue to have nausea or vomiting at home, and medicines are not helpful.  You cannot drink fluids or start eating again.  You cannot urinate after 8-12 hours.  You develop a skin rash.  You have fever.  You have increasing redness at the site of your procedure. Get help right away if:  You have difficulty breathing.  You have chest pain.  You have unexpected bleeding.  You feel that you are having a life-threatening or urgent problem. This information is not intended to replace advice given to you by your health care provider. Make sure you discuss any questions you have with your health care provider. Document Released: 03/04/2001 Document Revised: 04/30/2016 Document Reviewed: 11/10/2015 Elsevier Interactive Patient Education  2018 [place] Anesthesia, Adult, Care After These instructions provide you with information about caring for yourself after your procedure. Your health care provider may also give you more specific instructions. Your treatment has been planned according to current medical practices, but problems sometimes occur. Call your health care provider if you have any problems or questions after your procedure. What can I expect after the procedure? After the procedure, it is common to have:  Vomiting.  A sore throat.  Mental slowness.  It is common to feel:  Nauseous.  Cold or shivery.  Sleepy.  Tired.  Sore or achy, even in parts of your body where you did not have surgery.  Follow these instructions at home: For at least 24 hours after the procedure:  Do not: ? Participate in activities where you could fall or become injured. ? Drive. ? Use heavy  machinery. ? Drink alcohol. ? Take sleeping pills or medicines that cause drowsiness. ? Make important decisions or sign legal documents. ? Take care of children on your own.  Rest. Eating and drinking  If you vomit, drink water, juice, or soup when you can drink without vomiting.  Drink enough fluid to keep your urine clear or pale yellow.  Make sure you have little or no nausea before eating solid foods.  Follow the diet recommended by your health care provider. General instructions  Have a responsible adult stay with you until you are awake and alert.  Return to your normal activities as told by your health care provider. Ask your health care provider what activities are safe for you.  Take over-the-counter and prescription medicines only as told by your health care provider.  If you smoke, do not smoke without supervision.  Keep all follow-up visits as told by your health care provider. This is important. Contact a health care provider if:  You continue to have nausea or vomiting at home, and medicines are not helpful.  You cannot drink fluids or start eating again.  You cannot urinate after 8-12 hours.  You develop a skin rash.  You have fever.  You have increasing redness at the  site of your procedure. Get help right away if:  You have difficulty breathing.  You have chest pain.  You have unexpected bleeding.  You feel that you are having a life-threatening or urgent problem. This information is not intended to replace advice given to you by your health care provider. Make sure you discuss any questions you have with your health care provider. Document Released: 03/04/2001 Document Revised: 04/30/2016 Document Reviewed: 11/10/2015 Elsevier Interactive Patient Education  [employer].

## 2018-10-13 ENCOUNTER — Encounter (HOSPITAL_COMMUNITY): Payer: Self-pay

## 2018-10-13 ENCOUNTER — Other Ambulatory Visit: Payer: Self-pay

## 2018-10-13 ENCOUNTER — Encounter (HOSPITAL_COMMUNITY)
Admission: RE | Admit: 2018-10-13 | Discharge: 2018-10-13 | Disposition: A | Discharge status: Home / Self Care | Payer: Medicare Other | Source: Ambulatory Visit | Attending: Orthopedic Surgery | Admitting: Orthopedic Surgery

## 2018-10-13 DIAGNOSIS — Z01812 Encounter for preprocedural laboratory examination: Secondary | ICD-10-CM | POA: Insufficient documentation

## 2018-10-13 HISTORY — DX: Gastro-esophageal reflux disease without esophagitis: K21.9

## 2018-10-13 LAB — BASIC METABOLIC PANEL
Anion gap: 9 (ref 5–15)
BUN: 21 mg/dL (ref 8–23)
CHLORIDE: 108 mmol/L (ref 98–111)
CO2: 22 mmol/L (ref 22–32)
CREATININE: 0.69 mg/dL (ref 0.44–1.00)
Calcium: 8.9 mg/dL (ref 8.9–10.3)
GFR calc non Af Amer: >60 mL/min (ref >60)
Glucose, Bld: 98 mg/dL (ref 70–99)
Potassium: 3.6 mmol/L (ref 3.5–5.1)
Sodium: 139 mmol/L (ref 135–145)

## 2018-10-13 LAB — CBC WITH DIFFERENTIAL/PLATELET
Abs Immature Granulocytes: 0.07 K/uL (ref 0.00–0.07)
Basophils Absolute: 0 K/uL (ref 0.0–0.1)
Basophils Relative: 1 %
Eosinophils Absolute: 0.1 K/uL (ref 0.0–0.5)
Eosinophils Relative: 2 %
HCT: 39.6 % (ref 36.0–46.0)
Hemoglobin: 11.8 g/dL — ABNORMAL LOW (ref 12.0–15.0)
Immature Granulocytes: 2 %
Lymphocytes Relative: 39 %
Lymphs Abs: 1.8 K/uL (ref 0.7–4.0)
MCH: 28.2 pg (ref 26.0–34.0)
MCHC: 29.8 g/dL — ABNORMAL LOW (ref 30.0–36.0)
MCV: 94.5 fL (ref 80.0–100.0)
Monocytes Absolute: 0.4 K/uL (ref 0.1–1.0)
Monocytes Relative: 8 %
Neutro Abs: 2.3 K/uL (ref 1.7–7.7)
Neutrophils Relative %: 48 %
Platelets: 231 K/uL (ref 150–400)
RBC: 4.19 MIL/uL (ref 3.87–5.11)
RDW: 13.7 % (ref 11.5–15.5)
WBC: 4.6 K/uL (ref 4.0–10.5)
nRBC: 0 % (ref 0.0–0.2)

## 2018-10-14 ENCOUNTER — Other Ambulatory Visit: Payer: Self-pay | Admitting: Family Medicine

## 2018-10-15 NOTE — H&P (Signed)
PREOP CONSULT/REFERRAL INTRA-OFFICE FROM DR Tyrone Apple     Chief Complaint  Patient presents with  . Knee Pain      Right knee      72 years old female failed conservative treatment for lateral meniscal tear.  Please see Dr. Brooke Bonito prior notes   Patient complains of pain lateral joint line right knee x several months.  She says the knee will give way or feel like it wants to give way at times.  She complains of dull aching pain moderate denies clicking locking catching         Review of Systems  Constitutional: Negative for chills and fever.  Musculoskeletal: Positive for joint pain.  Neurological: Negative for tingling, speech change, focal weakness and weakness.  All other systems reviewed and are negative.           Past Medical History:  Diagnosis Date  . Anemia    . Arthritis    . Carpal tunnel syndrome, bilateral    . Diverticulosis dx 2011  . Headache    . Hypertension 06/02/2018  . Low back pain    . Obesity    . Rheumatoid arthritis(714.0)             Past Surgical History:  Procedure Laterality Date  . bilateral foot surgery      . COLONOSCOPY   2011    Dr. Oneida Alar: 70mm sessile polyp (tubular adenoma), pancolonic diverticulosis  . COLONOSCOPY N/A 09/26/2017    Procedure: COLONOSCOPY;  Surgeon: Danie Binder, MD;  Location: [facility];  Service: Endoscopy;  Laterality: N/A;  7:30am  . ESOPHAGOGASTRODUODENOSCOPY N/A 09/16/2017    Procedure: ESOPHAGOGASTRODUODENOSCOPY (EGD);  Surgeon: Danie Binder, MD;  Location: [facility];  Service: Endoscopy;  Laterality: N/A;  . FLEXIBLE SIGMOIDOSCOPY N/A 09/16/2017    Procedure: FLEXIBLE SIGMOIDOSCOPY;  Surgeon: Danie Binder, MD;  Location: [facility];  Service: Endoscopy;  Laterality: N/A;  . [place]  . roophorectomy for benign disease   March 2011    Dr. Glo Herring  . tonsillectomy and adenoidctomy in childhood      . TUBAL LIGATION   1977           Family History  Problem  Relation Age of Onset  . Heart failure Mother    . Cancer Mother 65        oral  . Heart failure Father    . Arthritis Sister    . Heart attack Brother    . Thyroid disease Daughter    . Hypertension Daughter    . Hypertension Daughter    . Thyroid disease Daughter    . Heart failure Sister    . Diabetes Sister    . Hypertension Sister    . Leukemia Brother    . Colon cancer Neg Hx    . Colon polyps Neg Hx      Social History    Tobacco Use  . Smoking status: Former Smoker      Packs/day: 2.00      Years: 25.00      Pack years: 50.00      Types: Cigarettes      Last attempt to quit: 05/12/2014      Years since quitting: 4.3  . Smokeless tobacco: Never Used  Substance Use Topics  . Alcohol use: No  . Drug use: No      No Known Allergies     Active Medications  Current Meds  Medication Sig  . acetaminophen (TYLENOL) 500 MG tablet One twice daily for arthritic pain (Patient taking differently: Take 1,000 mg by mouth every 4 (four) hours as needed for mild pain. One twice daily for arthritic pain)  . alendronate (FOSAMAX) 70 MG tablet Take 1 tablet (70 mg total) by mouth every 7 (seven) days. Take with a full glass of water on an empty stomach.  Marland Kitchen amLODipine (NORVASC) 2.5 MG tablet Take 1 tablet (2.5 mg total) by mouth daily.  . Calcium Carb-Cholecalciferol (CALCIUM 600 + D PO) Take 2 tablets by mouth every evening.  . cyclobenzaprine (FLEXERIL) 5 MG tablet One tablet at bedtime as needed for back spasm  . folic acid (FOLVITE) 016 MCG tablet Take 800 mcg by mouth every evening.   Marland Kitchen IRON-B12-VITAMINS PO Take 1 tablet by mouth every evening.  Marland Kitchen levothyroxine (SYNTHROID, LEVOTHROID) 25 MCG tablet One and a half tablets every Monday, Wednesday, Friday and Sunday, and one tablet every Tuesday, Thursday and Saturday.Take fasting with water and wait 30 minutes before eating  . Multiple Vitamins-Iron (MULTIVITAMINS WITH IRON) TABS tablet Take 1 tablet by mouth every evening.    Marland Kitchen omeprazole (PRILOSEC) 20 MG capsule 1 PO 30 MINS PRIOR TO BREAKFAST.        BP 126/75   Pulse 83   Ht 5\' 2"  (1.575 m)   Wt 171 lb (77.6 kg)   BMI 31.28 kg/m    Physical Exam  Constitutional: She is oriented to person, place, and time. She appears well-developed and well-nourished.  Musculoskeletal:       Right knee: She exhibits no effusion.       Left knee: She exhibits no effusion.  Neurological: She is alert and oriented to person, place, and time.  Psychiatric: She has a normal mood and affect. Judgment normal.  Vitals reviewed.     Right Knee Exam    Muscle Strength  The patient has normal right knee strength.   Tenderness  The patient is experiencing tenderness in the lateral joint line and medial joint line.   Range of Motion  Extension: normal  Flexion:  100 normal    Tests  McMurray:  Medial - negative Lateral - positive Varus: negative Valgus: negative Drawer:  Anterior - negative    Posterior - negative   Other  Erythema: absent Scars: absent Sensation: normal Pulse: present Swelling: none Effusion: no effusion present   Comments:  Gait alteration antalgic  right knee gait     Left Knee Exam    Muscle Strength  The patient has normal left knee strength.   Tenderness  The patient is experiencing tenderness in the lateral joint line and medial joint line.   Range of Motion  Extension: normal  Flexion:  110 normal    Tests  McMurray:  Medial - negative Lateral - negative Varus: negative Valgus: negative Drawer:  Anterior - negative     Posterior - negative   Other  Erythema: absent Scars: absent Sensation: normal Pulse: present Swelling: none Effusion: no effusion present           MEDICAL DECISION SECTION  Imaging Personal interpretation  xrays valgus alignment oa all 3 compartments    Mri  All 3 compartments oa with lateral meniscus tear        Encounter Diagnoses  Name Primary?  . Primary localized osteoarthritis  of knee Yes  . Meniscus, lateral, derangement, right          PLAN:  Surgical procedure planned: SARK LATERAL MENISECTOMY   The procedure has been fully reviewed with the patient; The risks and benefits of surgery have been discussed and explained and understood. Alternative treatment has also been reviewed, questions were encouraged and answered. The postoperative plan is also been reviewed.  She has been advised about the arthritis in the knee and the likelihood of persistent discomfort in the right knee   The procedure has been fully reviewed with the patient; The risks and benefits of surgery have been discussed and explained and understood. Alternative treatment has also been reviewed, questions were encouraged and answered. The postoperative plan is also been reviewed.   Nonsurgical treatment as described in the history and physical section was attempted and unsuccessful and the patient has agreed to proceed with surgical intervention to improve their situation.   No orders of the defined types were placed in this encounter.     Arther Abbott, MD

## 2018-10-16 ENCOUNTER — Ambulatory Visit (HOSPITAL_COMMUNITY)
Admission: RE | Admit: 2018-10-16 | Discharge: 2018-10-16 | Disposition: A | Discharge status: Home / Self Care | Payer: Medicare Other | Source: Ambulatory Visit | Attending: Orthopedic Surgery | Admitting: Orthopedic Surgery

## 2018-10-16 ENCOUNTER — Encounter (HOSPITAL_COMMUNITY): Payer: Self-pay

## 2018-10-16 ENCOUNTER — Encounter (HOSPITAL_COMMUNITY)
Admission: RE | Disposition: A | Discharge status: Home / Self Care | Payer: Self-pay | Source: Ambulatory Visit | Attending: Orthopedic Surgery

## 2018-10-16 ENCOUNTER — Ambulatory Visit (HOSPITAL_COMMUNITY): Payer: Medicare Other | Admitting: Anesthesiology

## 2018-10-16 DIAGNOSIS — M238X1 Other internal derangements of right knee: Secondary | ICD-10-CM | POA: Diagnosis not present

## 2018-10-16 DIAGNOSIS — M545 Low back pain: Secondary | ICD-10-CM | POA: Diagnosis not present

## 2018-10-16 DIAGNOSIS — M069 Rheumatoid arthritis, unspecified: Secondary | ICD-10-CM | POA: Diagnosis not present

## 2018-10-16 DIAGNOSIS — D649 Anemia, unspecified: Secondary | ICD-10-CM | POA: Diagnosis not present

## 2018-10-16 DIAGNOSIS — Z806 Family history of leukemia: Secondary | ICD-10-CM | POA: Diagnosis not present

## 2018-10-16 DIAGNOSIS — M94261 Chondromalacia, right knee: Secondary | ICD-10-CM | POA: Insufficient documentation

## 2018-10-16 DIAGNOSIS — K219 Gastro-esophageal reflux disease without esophagitis: Secondary | ICD-10-CM | POA: Insufficient documentation

## 2018-10-16 DIAGNOSIS — Z87891 Personal history of nicotine dependence: Secondary | ICD-10-CM | POA: Insufficient documentation

## 2018-10-16 DIAGNOSIS — Z6829 Body mass index (BMI) 29.0-29.9, adult: Secondary | ICD-10-CM | POA: Insufficient documentation

## 2018-10-16 DIAGNOSIS — Z8261 Family history of arthritis: Secondary | ICD-10-CM | POA: Insufficient documentation

## 2018-10-16 DIAGNOSIS — Z800 Family history of malignant neoplasm of digestive organs: Secondary | ICD-10-CM | POA: Diagnosis not present

## 2018-10-16 DIAGNOSIS — G709 Myoneural disorder, unspecified: Secondary | ICD-10-CM | POA: Diagnosis not present

## 2018-10-16 DIAGNOSIS — Z79899 Other long term (current) drug therapy: Secondary | ICD-10-CM | POA: Diagnosis not present

## 2018-10-16 DIAGNOSIS — Z833 Family history of diabetes mellitus: Secondary | ICD-10-CM | POA: Diagnosis not present

## 2018-10-16 DIAGNOSIS — I10 Essential (primary) hypertension: Secondary | ICD-10-CM | POA: Diagnosis not present

## 2018-10-16 DIAGNOSIS — Z8349 Family history of other endocrine, nutritional and metabolic diseases: Secondary | ICD-10-CM | POA: Insufficient documentation

## 2018-10-16 DIAGNOSIS — M1711 Unilateral primary osteoarthritis, right knee: Secondary | ICD-10-CM | POA: Diagnosis not present

## 2018-10-16 DIAGNOSIS — N289 Disorder of kidney and ureter, unspecified: Secondary | ICD-10-CM | POA: Diagnosis not present

## 2018-10-16 DIAGNOSIS — S83281A Other tear of lateral meniscus, current injury, right knee, initial encounter: Secondary | ICD-10-CM | POA: Diagnosis not present

## 2018-10-16 DIAGNOSIS — E039 Hypothyroidism, unspecified: Secondary | ICD-10-CM | POA: Insufficient documentation

## 2018-10-16 DIAGNOSIS — G5603 Carpal tunnel syndrome, bilateral upper limbs: Secondary | ICD-10-CM | POA: Diagnosis not present

## 2018-10-16 DIAGNOSIS — M23251 Derangement of posterior horn of lateral meniscus due to old tear or injury, right knee: Secondary | ICD-10-CM | POA: Insufficient documentation

## 2018-10-16 DIAGNOSIS — M6751 Plica syndrome, right knee: Secondary | ICD-10-CM | POA: Diagnosis not present

## 2018-10-16 DIAGNOSIS — E669 Obesity, unspecified: Secondary | ICD-10-CM | POA: Insufficient documentation

## 2018-10-16 DIAGNOSIS — Z8249 Family history of ischemic heart disease and other diseases of the circulatory system: Secondary | ICD-10-CM | POA: Insufficient documentation

## 2018-10-16 DIAGNOSIS — M23300 Other meniscus derangements, unspecified lateral meniscus, right knee: Secondary | ICD-10-CM | POA: Diagnosis not present

## 2018-10-16 HISTORY — PX: KNEE ARTHROSCOPY WITH LATERAL MENISECTOMY: SHX6193

## 2018-10-16 SURGERY — ARTHROSCOPY, KNEE, WITH LATERAL MENISCECTOMY
Anesthesia: General | Site: Knee | Laterality: Right

## 2018-10-16 MED ORDER — HYDROMORPHONE HCL 1 MG/ML IJ SOLN: 0.2500 mg | INTRAMUSCULAR | Status: DC | PRN

## 2018-10-16 MED ORDER — MEPERIDINE HCL 50 MG/ML IJ SOLN: 6.2500 mg | INTRAMUSCULAR | Status: DC | PRN

## 2018-10-16 MED ORDER — CEFAZOLIN SODIUM-DEXTROSE 2-4 GM/100ML-% IV SOLN
2.0000 g | INTRAVENOUS | Status: AC
  Administered 2018-10-16: 2 g via INTRAVENOUS
  Filled 2018-10-16: qty 100

## 2018-10-16 MED ORDER — IBUPROFEN 800 MG PO TABS
800.0000 mg | ORAL_TABLET | Freq: Once | ORAL | Status: DC
  Filled 2018-10-16: qty 1

## 2018-10-16 MED ORDER — LACTATED RINGERS IV SOLN
INTRAVENOUS | Status: DC
  Administered 2018-10-16: 07:00:00 via INTRAVENOUS

## 2018-10-16 MED ORDER — ONDANSETRON HCL 4 MG/2ML IJ SOLN: 4.0000 mg | Freq: Once | INTRAMUSCULAR | Status: DC | PRN

## 2018-10-16 MED ORDER — FENTANYL CITRATE (PF) 100 MCG/2ML IJ SOLN
INTRAMUSCULAR | Status: DC | PRN
  Administered 2018-10-16: 25 ug via INTRAVENOUS
  Administered 2018-10-16 (×2): 50 ug via INTRAVENOUS
  Administered 2018-10-16 (×3): 25 ug via INTRAVENOUS
  Administered 2018-10-16: 50 ug via INTRAVENOUS

## 2018-10-16 MED ORDER — HYDROCODONE-ACETAMINOPHEN 5-325 MG PO TABS: 1.0000 | ORAL_TABLET | ORAL | 0 refills | Status: DC | PRN

## 2018-10-16 MED ORDER — ONDANSETRON HCL 4 MG/2ML IJ SOLN
4.0000 mg | Freq: Once | INTRAMUSCULAR | Status: AC
  Administered 2018-10-16: 4 mg via INTRAVENOUS
  Filled 2018-10-16: qty 2

## 2018-10-16 MED ORDER — HYDROCODONE-ACETAMINOPHEN 5-325 MG PO TABS
1.0000 | ORAL_TABLET | Freq: Once | ORAL | Status: DC
  Filled 2018-10-16: qty 1

## 2018-10-16 MED ORDER — 0.9 % SODIUM CHLORIDE (POUR BTL) OPTIME
TOPICAL | Status: DC | PRN
  Administered 2018-10-16: 1000 mL

## 2018-10-16 MED ORDER — MIDAZOLAM HCL 2 MG/2ML IJ SOLN
INTRAMUSCULAR | Status: AC
  Filled 2018-10-16: qty 2

## 2018-10-16 MED ORDER — LIDOCAINE HCL (CARDIAC) PF 50 MG/5ML IV SOSY
PREFILLED_SYRINGE | INTRAVENOUS | Status: DC | PRN
  Administered 2018-10-16: 30 mg via INTRAVENOUS

## 2018-10-16 MED ORDER — BUPIVACAINE-EPINEPHRINE (PF) 0.25% -1:200000 IJ SOLN
INTRAMUSCULAR | Status: AC
  Filled 2018-10-16: qty 60

## 2018-10-16 MED ORDER — PROPOFOL 10 MG/ML IV BOLUS
INTRAVENOUS | Status: AC
  Filled 2018-10-16: qty 40

## 2018-10-16 MED ORDER — EPINEPHRINE PF 1 MG/ML IJ SOLN
INTRAMUSCULAR | Status: AC
  Filled 2018-10-16: qty 5

## 2018-10-16 MED ORDER — LIDOCAINE HCL (PF) 1 % IJ SOLN
INTRAMUSCULAR | Status: AC
  Filled 2018-10-16: qty 5

## 2018-10-16 MED ORDER — KETOROLAC TROMETHAMINE 30 MG/ML IJ SOLN: 30.0000 mg | Freq: Once | INTRAMUSCULAR | Status: DC | PRN

## 2018-10-16 MED ORDER — SODIUM CHLORIDE 0.9 % IR SOLN
Status: DC | PRN
  Administered 2018-10-16 (×3): 3000 mL

## 2018-10-16 MED ORDER — CHLORHEXIDINE GLUCONATE 4 % EX LIQD: 60.0000 mL | Freq: Once | CUTANEOUS | Status: DC

## 2018-10-16 MED ORDER — SUCCINYLCHOLINE CHLORIDE 20 MG/ML IJ SOLN
INTRAMUSCULAR | Status: AC
  Filled 2018-10-16: qty 1

## 2018-10-16 MED ORDER — PROPOFOL 10 MG/ML IV BOLUS
INTRAVENOUS | Status: DC | PRN
  Administered 2018-10-16: 100 mg via INTRAVENOUS

## 2018-10-16 MED ORDER — SODIUM CHLORIDE (PF) 0.9 % IJ SOLN
INTRAMUSCULAR | Status: AC
  Filled 2018-10-16: qty 10

## 2018-10-16 MED ORDER — BUPIVACAINE-EPINEPHRINE (PF) 0.25% -1:200000 IJ SOLN
INTRAMUSCULAR | Status: DC | PRN
  Administered 2018-10-16: 60 mL via PERINEURAL

## 2018-10-16 MED ORDER — FENTANYL CITRATE (PF) 250 MCG/5ML IJ SOLN
INTRAMUSCULAR | Status: AC
  Filled 2018-10-16: qty 5

## 2018-10-16 MED ORDER — HYDROCODONE-ACETAMINOPHEN 7.5-325 MG PO TABS
1.0000 | ORAL_TABLET | Freq: Once | ORAL | Status: AC | PRN
  Administered 2018-10-16: 1 via ORAL
  Filled 2018-10-16: qty 1

## 2018-10-16 SURGICAL SUPPLY — 47 items
ARTHROWAND PARAGON T2 (SURGICAL WAND)
BANDAGE ELASTIC 6 LF NS (GAUZE/BANDAGES/DRESSINGS) ×3 IMPLANT
BIT DRILL 2.0MX128MM (BIT) IMPLANT
BLADE 11 SAFETY STRL DISP (BLADE) ×3 IMPLANT
BLADE AGGRESSIVE PLUS 4.0 (BLADE) ×3 IMPLANT
CHLORAPREP W/TINT 26ML (MISCELLANEOUS) ×6 IMPLANT
CLOTH BEACON ORANGE TIMEOUT ST (SAFETY) ×3 IMPLANT
COOLER CRYO IC GRAV AND TUBE (ORTHOPEDIC SUPPLIES) ×3 IMPLANT
CUFF CRYO KNEE18X23 MED (MISCELLANEOUS) ×3 IMPLANT
CUFF TOURNIQUET SINGLE 34IN LL (TOURNIQUET CUFF) ×3 IMPLANT
CUTTER ANGLED DBL BITE 4.5 (BURR) IMPLANT
DECANTER SPIKE VIAL GLASS SM (MISCELLANEOUS) ×6 IMPLANT
GAUZE 4X4 16PLY RFD (DISPOSABLE) ×3 IMPLANT
GAUZE SPONGE 4X4 12PLY STRL (GAUZE/BANDAGES/DRESSINGS) ×3 IMPLANT
GAUZE XEROFORM 5X9 LF (GAUZE/BANDAGES/DRESSINGS) ×3 IMPLANT
GLOVE BIOGEL PI IND STRL 7.0 (GLOVE) ×2 IMPLANT
GLOVE BIOGEL PI INDICATOR 7.0 (GLOVE) ×4
GLOVE ECLIPSE 6.5 STRL STRAW (GLOVE) ×3 IMPLANT
GLOVE SKINSENSE NS SZ8.0 LF (GLOVE) ×2
GLOVE SKINSENSE STRL SZ8.0 LF (GLOVE) ×1 IMPLANT
GLOVE SS N UNI LF 8.5 STRL (GLOVE) ×3 IMPLANT
GOWN STRL REUS W/TWL LRG LVL3 (GOWN DISPOSABLE) ×3 IMPLANT
GOWN STRL REUS W/TWL XL LVL3 (GOWN DISPOSABLE) ×3 IMPLANT
IV NS IRRIG 3000ML ARTHROMATIC (IV SOLUTION) ×9 IMPLANT
KIT BLADEGUARD II DBL (SET/KITS/TRAYS/PACK) ×3 IMPLANT
KIT TURNOVER CYSTO (KITS) ×3 IMPLANT
MANIFOLD NEPTUNE II (INSTRUMENTS) ×3 IMPLANT
MARKER SKIN DUAL TIP RULER LAB (MISCELLANEOUS) ×3 IMPLANT
NEEDLE HYPO 18GX1.5 BLUNT FILL (NEEDLE) ×3 IMPLANT
NEEDLE HYPO 21X1.5 SAFETY (NEEDLE) ×3 IMPLANT
NEEDLE SPNL 18GX3.5 QUINCKE PK (NEEDLE) ×3 IMPLANT
NS IRRIG 1000ML POUR BTL (IV SOLUTION) ×3 IMPLANT
PACK ARTHRO LIMB DRAPE STRL (MISCELLANEOUS) ×3 IMPLANT
PAD ABD 5X9 TENDERSORB (GAUZE/BANDAGES/DRESSINGS) ×3 IMPLANT
PAD ARMBOARD 7.5X6 YLW CONV (MISCELLANEOUS) ×3 IMPLANT
PADDING CAST COTTON 6X4 STRL (CAST SUPPLIES) ×3 IMPLANT
PADDING WEBRIL 6 STERILE (GAUZE/BANDAGES/DRESSINGS) ×3 IMPLANT
PROBE BIPOLAR 50 DEGREE SUCT (MISCELLANEOUS) IMPLANT
PROBE BIPOLAR ATHRO 135MM 90D (MISCELLANEOUS) ×3 IMPLANT
SET ARTHROSCOPY INST (INSTRUMENTS) ×3 IMPLANT
SET BASIN LINEN APH (SET/KITS/TRAYS/PACK) ×3 IMPLANT
SUT ETHILON 3 0 FSL (SUTURE) ×3 IMPLANT
SYR 30ML LL (SYRINGE) ×3 IMPLANT
TUBE CONNECTING 12'X1/4 (SUCTIONS) ×2
TUBE CONNECTING 12X1/4 (SUCTIONS) ×4 IMPLANT
TUBING ARTHRO INFLOW-ONLY STRL (TUBING) ×3 IMPLANT
WAND ARTHRO PARAGON T2 (SURGICAL WAND) IMPLANT

## 2018-10-16 NOTE — Discharge Instructions (Signed)
Your knee had arthritis and a torn lateral meniscus as noted on the preop MRI  We remove the meniscal tear which required removing 15% of your lateral meniscus  We also removed any arthritic lesions that we saw  You will take hydrocodone for pain for 5 days and then Tylenol or Advil can be used for pain

## 2018-10-16 NOTE — Brief Op Note (Signed)
10/16/2018  8:31 AM  PATIENT:  Bianca Mcguire  72 y.o. female  PRE-OPERATIVE DIAGNOSIS:  torn lateral meniscus right knee  POST-OPERATIVE DIAGNOSIS:  torn lateral meniscus right knee  PROCEDURE:  Procedure(s): KNEE ARTHROSCOPY WITH LATERAL MENISCECTOMY (Right) - 29881  Lateral meniscal tear at the body fraying of the posterior horn grade II chondromalacia of the lateral femoral condyle diffuse Mild free edge fraying medial meniscus grade 4 chondral lesion under the posterior horn of the medial meniscus grade II chondromalacia diffuse medial femoral condyle Large medial plica ACL PCL degeneration but intact stable Mild patellofemoral chondromalacia  SURGEON:  Surgeon(s) and Role:    Carole Civil, MD - Primary Knee arthroscopy dictation  The patient was identified in the preoperative holding area using 2 approved identification mechanisms. The chart was reviewed and updated. The surgical site was confirmed as right knee and marked with an indelible marker.  The patient was taken to the operating room for anesthesia. After successful general anesthesia, Ancef 2 g was used as IV antibiotics.  The patient was placed in the supine position with the (right knee) the operative extremity in an arthroscopic leg holder and the opposite extremity in a padded leg holder.  The timeout was executed.  A lateral portal was established with an 11 blade and the scope was introduced into the joint. A diagnostic arthroscopy was performed in circumferential manner examining the entire knee joint. A medial portal was established and the diagnostic arthroscopy was repeated using a probe to palpate intra-articular structures as they were encountered.    The lateral meniscus was resected using a duckbill forceps. The meniscal fragments were removed with a motorized shaver. The meniscus was balanced with a combination of a motorized shaver and a 50 ArthroCare wand until a stable rim was  obtained.  Free edge fraying was debrided with shaver on the medial and lateral side with shaving of the articular cartilage of the lateral femoral condyle and patellofemoral joint  The arthroscopic pump was placed on the wash mode and any excess debris was removed from the joint using suction.  60 cc of Marcaine with epinephrine was injected through the arthroscope.  The portals were closed with 3-0 nylon suture.  A sterile bandage, Ace wrap and Cryo/Cuff was placed and the Cryo/Cuff was activated. The patient was taken to the recovery room in stable condition.  PHYSICIAN ASSISTANT:   ASSISTANTS: none   ANESTHESIA:   general  EBL:  0 mL   BLOOD ADMINISTERED:none  DRAINS: none   LOCAL MEDICATIONS USED:  MARCAINE     SPECIMEN:  No Specimen  DISPOSITION OF SPECIMEN:  N/A  COUNTS:  YES  TOURNIQUET:  * Missing tourniquet times found for documented tourniquets in log: [id number] *  DICTATION: .Dragon Dictation  PLAN OF CARE: Discharge to home after PACU  PATIENT DISPOSITION:  PACU - hemodynamically stable.   Delay start of Pharmacological VTE agent (>24hrs) due to surgical blood loss or risk of bleeding: not applicable

## 2018-10-16 NOTE — Anesthesia Postprocedure Evaluation (Signed)
Anesthesia Post Note  Patient: Bianca Mcguire  Procedure(s) Performed: KNEE ARTHROSCOPY WITH LATERAL MENISCECTOMY (Right Knee)  Patient location during evaluation: PACU Anesthesia Type: General Level of consciousness: awake and alert and oriented Pain management: pain level controlled Vital Signs Assessment: post-procedure vital signs reviewed and stable Respiratory status: spontaneous breathing Cardiovascular status: blood pressure returned to baseline and stable Postop Assessment: no apparent nausea or vomiting and adequate PO intake Anesthetic complications: no     Last Vitals:  Vitals:   10/16/18 0845 10/16/18 0900  BP: (!) 148/65   Pulse: (!) 57 (!) 59  Resp: 13 (!) 9  Temp:    SpO2:      Last Pain:  Vitals:   10/16/18 0915  TempSrc:   PainSc: 4                  [name]

## 2018-10-16 NOTE — Anesthesia Procedure Notes (Signed)
Procedure Name: LMA Insertion Date/Time: 10/16/2018 7:46 AM Performed by: Ollen Bowl, CRNA Pre-anesthesia Checklist: Patient identified, Patient being monitored, Emergency Drugs available, Timeout performed and Suction available Patient Re-evaluated:Patient Re-evaluated prior to induction Oxygen Delivery Method: Circle System Utilized Preoxygenation: Pre-oxygenation with 100% oxygen Induction Type: IV induction Ventilation: Mask ventilation without difficulty LMA: LMA inserted LMA Size: 3.0 Number of attempts: 1 Placement Confirmation: positive ETCO2 and breath sounds checked- equal and bilateral

## 2018-10-16 NOTE — Interval H&P Note (Signed)
History and Physical Interval Note:  10/16/2018 7:20 AM  Bianca Mcguire  has presented today for surgery, with the diagnosis of torn medial meniscus right knee  The various methods of treatment have been discussed with the patient and family. After consideration of risks, benefits and other options for treatment, the patient has consented to  Procedure(s): KNEE ARTHROSCOPY WITH LATERAL MENISCECTOMY (Right) as a surgical intervention .  The patient's history has been reviewed, patient examined, no change in status, stable for surgery.  I have reviewed the patient's chart and labs.  Questions were answered to the patient's satisfaction.     Arther Abbott

## 2018-10-16 NOTE — Op Note (Signed)
10/16/2018  8:31 AM  PATIENT:  Bianca Mcguire  72 y.o. female  PRE-OPERATIVE DIAGNOSIS:  torn lateral meniscus right knee  POST-OPERATIVE DIAGNOSIS:  torn lateral meniscus right knee  PROCEDURE:  Procedure(s): KNEE ARTHROSCOPY WITH LATERAL MENISCECTOMY (Right) - 29881  Lateral meniscal tear at the body fraying of the posterior horn grade II chondromalacia of the lateral femoral condyle diffuse Mild free edge fraying medial meniscus grade 4 chondral lesion under the posterior horn of the medial meniscus grade II chondromalacia diffuse medial femoral condyle Large medial plica ACL PCL degeneration but intact stable Mild patellofemoral chondromalacia  SURGEON:  Surgeon(s) and Role:    Carole Civil, MD - Primary Knee arthroscopy dictation  The patient was identified in the preoperative holding area using 2 approved identification mechanisms. The chart was reviewed and updated. The surgical site was confirmed as right knee and marked with an indelible marker.  The patient was taken to the operating room for anesthesia. After successful general anesthesia, Ancef 2 g was used as IV antibiotics.  The patient was placed in the supine position with the (right knee) the operative extremity in an arthroscopic leg holder and the opposite extremity in a padded leg holder.  The timeout was executed.  A lateral portal was established with an 11 blade and the scope was introduced into the joint. A diagnostic arthroscopy was performed in circumferential manner examining the entire knee joint. A medial portal was established and the diagnostic arthroscopy was repeated using a probe to palpate intra-articular structures as they were encountered.    The lateral meniscus was resected using a duckbill forceps. The meniscal fragments were removed with a motorized shaver. The meniscus was balanced with a combination of a motorized shaver and a 50 ArthroCare wand until a stable rim was  obtained.  Free edge fraying was debrided with shaver on the medial and lateral side with shaving of the articular cartilage of the lateral femoral condyle and patellofemoral joint  The arthroscopic pump was placed on the wash mode and any excess debris was removed from the joint using suction.  60 cc of Marcaine with epinephrine was injected through the arthroscope.  The portals were closed with 3-0 nylon suture.  A sterile bandage, Ace wrap and Cryo/Cuff was placed and the Cryo/Cuff was activated. The patient was taken to the recovery room in stable condition.  PHYSICIAN ASSISTANT:   ASSISTANTS: none   ANESTHESIA:   general  EBL:  0 mL   BLOOD ADMINISTERED:none  DRAINS: none   LOCAL MEDICATIONS USED:  MARCAINE     SPECIMEN:  No Specimen  DISPOSITION OF SPECIMEN:  N/A  COUNTS:  YES  TOURNIQUET:  * Missing tourniquet times found for documented tourniquets in log: [id number] *  DICTATION: .Dragon Dictation  PLAN OF CARE: Discharge to home after PACU  PATIENT DISPOSITION:  PACU - hemodynamically stable.   Delay start of Pharmacological VTE agent (>24hrs) due to surgical blood loss or risk of bleeding: not applicable

## 2018-10-16 NOTE — Anesthesia Preprocedure Evaluation (Signed)
Anesthesia Evaluation  Patient identified by MRN, date of birth, ID band Patient awake    Reviewed: Allergy & Precautions, H&P , NPO status , Patient's Chart, lab work & pertinent test results, reviewed documented beta blocker date and time   Airway Mallampati: I  TM Distance: >3 FB Neck ROM: full    Dental  (+) Edentulous Lower, Edentulous Upper   Pulmonary neg pulmonary ROS, former smoker,    Pulmonary exam normal breath sounds clear to auscultation       Cardiovascular Exercise Tolerance: Good hypertension, negative cardio ROS   Rhythm:regular Rate:Normal     Neuro/Psych  Headaches,  Neuromuscular disease negative psych ROS   GI/Hepatic Neg liver ROS, GERD  ,  Endo/Other  Hypothyroidism   Renal/GU Renal disease  negative genitourinary   Musculoskeletal   Abdominal   Peds  Hematology  (+) Blood dyscrasia, anemia ,   Anesthesia Other Findings   Reproductive/Obstetrics negative OB ROS                             Anesthesia Physical Anesthesia Plan  ASA: II  Anesthesia Plan: General   Post-op Pain Management:    Induction:   PONV Risk Score and Plan:   Airway Management Planned:   Additional Equipment:   Intra-op Plan:   Post-operative Plan:   Informed Consent: I have reviewed the patients History and Physical, chart, labs and discussed the procedure including the risks, benefits and alternatives for the proposed anesthesia with the patient or authorized representative who has indicated his/her understanding and acceptance.   Dental Advisory Given  Plan Discussed with: CRNA  Anesthesia Plan Comments:         Anesthesia Quick Evaluation

## 2018-10-16 NOTE — Transfer of Care (Signed)
Immediate Anesthesia Transfer of Care Note  Patient: [name]  Procedure(s) Performed: KNEE ARTHROSCOPY WITH LATERAL MENISCECTOMY (Right Knee)  Patient Location: PACU  Anesthesia Type:General  Level of Consciousness: awake  Airway & Oxygen Therapy: Patient Spontanous Breathing  Post-op Assessment: Report given to RN  Post vital signs: Reviewed  Last Vitals:  Vitals Value Taken Time  BP 147/72 10/16/2018  8:37 AM  Temp    Pulse 65 10/16/2018  8:42 AM  Resp 13 10/16/2018  8:42 AM  SpO2 100 % 10/16/2018  8:42 AM  Vitals shown include unvalidated device data.  Last Pain:  Vitals:   10/16/18 0623  TempSrc: Oral  PainSc: 0-No pain         Complications: No apparent anesthesia complications

## 2018-10-17 ENCOUNTER — Encounter (HOSPITAL_COMMUNITY): Payer: Self-pay | Admitting: Orthopedic Surgery

## 2018-10-20 ENCOUNTER — Ambulatory Visit: Payer: Medicare Other

## 2018-10-23 DIAGNOSIS — Z9889 Other specified postprocedural states: Secondary | ICD-10-CM | POA: Insufficient documentation

## 2018-10-24 ENCOUNTER — Ambulatory Visit (INDEPENDENT_AMBULATORY_CARE_PROVIDER_SITE_OTHER): Payer: Medicare Other | Admitting: Orthopedic Surgery

## 2018-10-24 DIAGNOSIS — Z9889 Other specified postprocedural states: Secondary | ICD-10-CM

## 2018-10-24 NOTE — Progress Notes (Signed)
POSTOP VISIT  POD # 8  Chief Complaint  Patient presents with  . Follow-up    Recheck on right knee, DOS 10-16-18.    10/16/2018  8:31 AM  PATIENT:  Bianca Mcguire  72 y.o. female  PRE-OPERATIVE DIAGNOSIS:  torn lateral meniscus right knee  POST-OPERATIVE DIAGNOSIS:  torn lateral meniscus right knee  PROCEDURE:  Procedure(s): KNEE ARTHROSCOPY WITH LATERAL MENISCECTOMY (Right) - 29881  Lateral meniscal tear at the body fraying of the posterior horn grade II chondromalacia of the lateral femoral condyle diffuse Mild free edge fraying medial meniscus grade 4 chondral lesion under the posterior horn of the medial meniscus grade II chondromalacia diffuse medial femoral condyle Large medial plica ACL PCL degeneration but intact stable Mild patellofemoral chondromalacia  SURGEON:  Surgeon(s) and Role:    Carole Civil, MD - Primary   Encounter Diagnosis  Name Primary?  . S/P arthroscopy of right knee 10/16/18     The patient's portal sites look good she has minimal swelling of the joint she is able to flex her knee well extended good  Postoperative plan (Work, WB, No orders of the defined types were placed in this encounter. ,FU)  Recommend home exercises for 4 weeks and follow-up 4 weeks.

## 2018-10-24 NOTE — Patient Instructions (Signed)
Perform exercises on the sheet you were given for the next 4 weeks

## 2018-11-24 ENCOUNTER — Ambulatory Visit: Payer: Medicare Other | Admitting: Orthopedic Surgery

## 2018-11-24 ENCOUNTER — Encounter: Payer: Self-pay | Admitting: Orthopedic Surgery

## 2018-11-26 ENCOUNTER — Encounter: Payer: Self-pay | Admitting: Orthopedic Surgery

## 2019-02-02 ENCOUNTER — Ambulatory Visit: Payer: Medicare Other | Admitting: Family Medicine

## 2019-02-24 ENCOUNTER — Ambulatory Visit (INDEPENDENT_AMBULATORY_CARE_PROVIDER_SITE_OTHER): Payer: Medicare Other | Admitting: Family Medicine

## 2019-02-24 ENCOUNTER — Other Ambulatory Visit: Payer: Self-pay | Admitting: Family Medicine

## 2019-02-24 ENCOUNTER — Encounter: Payer: Self-pay | Admitting: Family Medicine

## 2019-02-24 ENCOUNTER — Other Ambulatory Visit: Payer: Self-pay

## 2019-02-24 VITALS — BP 136/76 | HR 75 | Temp 97.9°F | Resp 12 | Ht 62.0 in | Wt 176.1 lb

## 2019-02-24 DIAGNOSIS — Z1231 Encounter for screening mammogram for malignant neoplasm of breast: Secondary | ICD-10-CM | POA: Diagnosis not present

## 2019-02-24 DIAGNOSIS — M818 Other osteoporosis without current pathological fracture: Secondary | ICD-10-CM | POA: Diagnosis not present

## 2019-02-24 DIAGNOSIS — E785 Hyperlipidemia, unspecified: Secondary | ICD-10-CM | POA: Diagnosis not present

## 2019-02-24 DIAGNOSIS — E039 Hypothyroidism, unspecified: Secondary | ICD-10-CM

## 2019-02-24 DIAGNOSIS — I10 Essential (primary) hypertension: Secondary | ICD-10-CM | POA: Diagnosis not present

## 2019-02-24 MED ORDER — LEVOTHYROXINE SODIUM 25 MCG PO TABS: ORAL_TABLET | ORAL | 0 refills | Status: DC

## 2019-02-24 MED ORDER — CYCLOBENZAPRINE HCL 5 MG PO TABS: ORAL_TABLET | ORAL | 1 refills | Status: DC

## 2019-02-24 MED ORDER — LEVOTHYROXINE SODIUM 25 MCG PO TABS: ORAL_TABLET | ORAL | 2 refills | Status: DC

## 2019-02-24 NOTE — Patient Instructions (Addendum)
Annual Wellness with Nurse end August, and flu vaccine at that visit  Pt requests mammogram appointment, to be scheduled by office, you may call or ,mail the appt for July 10 or after   Physical exam with MD last week in November  New higher dose of synthroid, one and a half tablets, Monday through Friday, and one tablet Saturday and Sunday   Need fasting lipid, cmp and eGFr, tSH and vit D level on April 6 or  7, please get this done so I can determine synthroid dose   Careful not to fall, thankful knee surgery was a success!

## 2019-02-26 ENCOUNTER — Ambulatory Visit (INDEPENDENT_AMBULATORY_CARE_PROVIDER_SITE_OTHER): Payer: Medicare Other

## 2019-02-26 ENCOUNTER — Encounter: Payer: Self-pay | Admitting: Podiatry

## 2019-02-26 ENCOUNTER — Other Ambulatory Visit: Payer: Self-pay | Admitting: Podiatry

## 2019-02-26 ENCOUNTER — Ambulatory Visit: Payer: Medicare Other | Admitting: Podiatry

## 2019-02-26 ENCOUNTER — Other Ambulatory Visit: Payer: Self-pay

## 2019-02-26 DIAGNOSIS — M79672 Pain in left foot: Secondary | ICD-10-CM

## 2019-02-26 DIAGNOSIS — L600 Ingrowing nail: Secondary | ICD-10-CM

## 2019-02-26 DIAGNOSIS — M779 Enthesopathy, unspecified: Secondary | ICD-10-CM

## 2019-02-26 DIAGNOSIS — M79671 Pain in right foot: Secondary | ICD-10-CM

## 2019-02-26 MED ORDER — NEOMYCIN-POLYMYXIN-HC 3.5-10000-1 OT SOLN: OTIC | 0 refills | Status: DC

## 2019-02-26 MED ORDER — TRIAMCINOLONE ACETONIDE 10 MG/ML IJ SUSP
10.0000 mg | Freq: Once | INTRAMUSCULAR | Status: AC
  Administered 2019-02-26: 10 mg

## 2019-02-26 NOTE — Patient Instructions (Signed)

## 2019-02-26 NOTE — Progress Notes (Signed)
Subjective:   Patient ID: Bianca Mcguire, female   DOB: 73 y.o.   MRN: [id number]   HPI Patient presents with chronic pain on the plantar aspect of the right foot which is been hurting for several months and a severely thickened fourth nail right that is increasingly hard for her to cut and she had a nail removed on the left that did well and she would like this removed moved if possible   ROS      Objective:  Physical Exam  Neurovascular status intact with patient found to have a damaged thickened fourth nail right that is painful when pressed and is found to have inflammation of the third MPJ right with fluid buildup around the joint surface and pain with palpation.  Patient is noted to have good digital perfusion well oriented x3     Assessment:  Chronic painful nail fourth right along with inflammatory capsulitis third right     Plan:  H&P both conditions discussed and for the capsule I did sterile prep and then injected the joint 3 mg Dexasone Kenalog 5 mg Xylocaine I recommend removal of the nail patient wants procedure and I explained procedure and risk.  Today I infiltrated the right fourth toe 60 mg like Marcaine mixture sterile prep applied and using sterile instrumentation I removed the fourth nail exposed matrix and applied phenol for applications 30 seconds followed by alcohol lavage and sterile dressing.  Gave instructions for soaks and to leave dressing on 24 hours but to take it off early if any throbbing were to occur and did write prescription for drops and encouraged her to call with any questions concerns.  Reappoint 2 weeks unless she needs to be seen earlier

## 2019-02-28 ENCOUNTER — Encounter: Payer: Self-pay | Admitting: Family Medicine

## 2019-02-28 NOTE — Assessment & Plan Note (Signed)
Under corrected when last checked, need to recheck,in next 10 days as pt has been out of medication

## 2019-02-28 NOTE — Assessment & Plan Note (Signed)
Controlled, no change in medication DASH diet and commitment to daily physical activity for a minimum of 30 minutes discussed and encouraged, as a part of hypertension management. The importance of attaining a healthy weight is also discussed.  BP/Weight 02/24/2019 10/24/2018 10/16/2018 10/13/2018 09/08/2018 08/04/2018 0/73/7106  Systolic BP 269 485 462 703 500 938 182  Diastolic BP 76 87 69 80 75 80 79  Wt. (Lbs) 176.12 171 161 162 171 169.08 158  BMI 32.21 31.28 29.45 29.63 31.28 30.93 28.9

## 2019-02-28 NOTE — Assessment & Plan Note (Signed)
Hyperlipidemia:Low fat diet discussed and encouraged.   Lipid Panel  Lab Results  Component Value Date   CHOL 207 (H) 06/02/2018   HDL 67 06/02/2018   LDLCALC 120 (H) 06/02/2018   TRIG 96 06/02/2018   CHOLHDL 3.1 06/02/2018   Uncontrolled, need updated lab also

## 2019-02-28 NOTE — Progress Notes (Signed)
   Bianca Mcguire     MRN: [id number]      DOB: 1945/12/31   HPI Bianca Mcguire is here for follow up and re-evaluation of chronic medical conditions, medication management and review of any available recent lab and radiology data.  Preventive health is updated, specifically  Cancer screening and Immunization.   Ver successful knee surgery, less  Pain and improved stability The PT denies any adverse reactions to current medications since the last visit. Has been out of her synthroid for last 2 weeks, needs to resume so dose can be adjusted There are no new concerns.  There are no specific complaints   ROS Denies recent fever or chills. Denies sinus pressure, nasal congestion, ear pain or sore throat. Denies chest congestion, productive cough or wheezing. Denies chest pains, palpitations and leg swelling Denies abdominal pain, nausea, vomiting,diarrhea or constipation.   Denies dysuria, frequency, hesitancy or incontinence. C/o chronic joint pain, swelling and limitation in mobility. Denies headaches, seizures, numbness, or tingling. Denies depression, anxiety or insomnia. Denies skin break down or rash.   PE  BP 136/76   Pulse 75   Temp 97.9 F (36.6 C) (Oral)   Resp 12   Ht 5\' 2"  (1.575 m)   Wt 176 lb 1.9 oz (79.9 kg)   SpO2 94% Comment: room air  BMI 32.21 kg/m   Patient alert and oriented and in no cardiopulmonary distress.  HEENT: No facial asymmetry, EOMI,   oropharynx pink and moist.  Neck supple no JVD, no mass.  Chest: Clear to auscultation bilaterally.  CVS: S1, S2 no murmurs, no S3.Regular rate.  ABD: Soft non tender.   Ext: No edema  MS: Decreased  ROM spine, shoulders, hips and knees.  Skin: Intact, no ulcerations or rash noted.  Psych: Good eye contact, normal affect. Memory intact not anxious or depressed appearing.  CNS: CN 2-12 intact, power,  normal throughout.no focal deficits noted.   Assessment & Plan  Essential hypertension Controlled, no  change in medication DASH diet and commitment to daily physical activity for a minimum of 30 minutes discussed and encouraged, as a part of hypertension management. The importance of attaining a healthy weight is also discussed.  BP/Weight 02/24/2019 10/24/2018 10/16/2018 10/13/2018 09/08/2018 08/04/2018 4/43/1540  Systolic BP 086 761 950 932 671 245 809  Diastolic BP 76 87 69 80 75 80 79  Wt. (Lbs) 176.12 171 161 162 171 169.08 158  BMI 32.21 31.28 29.45 29.63 31.28 30.93 28.9       Hyperlipidemia Hyperlipidemia:Low fat diet discussed and encouraged.   Lipid Panel  Lab Results  Component Value Date   CHOL 207 (H) 06/02/2018   HDL 67 06/02/2018   LDLCALC 120 (H) 06/02/2018   TRIG 96 06/02/2018   CHOLHDL 3.1 06/02/2018   Uncontrolled, need updated lab also    Adult hypothyroidism Under corrected when last checked, need to recheck,in next 10 days as pt has been out of medication

## 2019-03-12 ENCOUNTER — Ambulatory Visit: Payer: Medicare Other | Admitting: Podiatry

## 2019-03-17 DIAGNOSIS — I10 Essential (primary) hypertension: Secondary | ICD-10-CM | POA: Diagnosis not present

## 2019-03-17 DIAGNOSIS — E039 Hypothyroidism, unspecified: Secondary | ICD-10-CM | POA: Diagnosis not present

## 2019-03-17 DIAGNOSIS — E785 Hyperlipidemia, unspecified: Secondary | ICD-10-CM | POA: Diagnosis not present

## 2019-03-17 DIAGNOSIS — M818 Other osteoporosis without current pathological fracture: Secondary | ICD-10-CM | POA: Diagnosis not present

## 2019-03-18 ENCOUNTER — Other Ambulatory Visit: Payer: Self-pay | Admitting: Family Medicine

## 2019-03-18 LAB — COMPLETE METABOLIC PANEL WITH GFR
AG Ratio: 1.6 (calc) (ref 1.0–2.5)
ALBUMIN MSPROF: 4.4 g/dL (ref 3.6–5.1)
ALKALINE PHOSPHATASE (APISO): 81 U/L (ref 37–153)
ALT: 8 U/L (ref 6–29)
AST: 18 U/L (ref 10–35)
BUN: 22 mg/dL (ref 7–25)
CHLORIDE: 107 mmol/L (ref 98–110)
CO2: 27 mmol/L (ref 20–32)
CREATININE: 0.71 mg/dL (ref 0.60–0.93)
Calcium: 9.2 mg/dL (ref 8.6–10.4)
GFR, Est African American: 99 mL/min/{1.73_m2} (ref >=60)
GFR, Est Non African American: 85 mL/min/{1.73_m2} (ref >=60)
GLUCOSE: 99 mg/dL (ref 65–99)
Globulin: 2.8 g/dL (calc) (ref 1.9–3.7)
Potassium: 4 mmol/L (ref 3.5–5.3)
Sodium: 142 mmol/L (ref 135–146)
Total Bilirubin: 0.4 mg/dL (ref 0.2–1.2)
Total Protein: 7.2 g/dL (ref 6.1–8.1)

## 2019-03-18 LAB — LIPID PANEL
Cholesterol: 202 mg/dL — ABNORMAL HIGH (ref <200)
HDL: 72 mg/dL (ref >=50)
LDL Cholesterol (Calc): 112 mg/dL (calc) — ABNORMAL HIGH
NON-HDL CHOLESTEROL (CALC): 130 mg/dL — AB (ref <130)
Total CHOL/HDL Ratio: 2.8 (calc) (ref <5.0)
Triglycerides: 85 mg/dL (ref <150)

## 2019-03-18 LAB — TSH: TSH: 8.16 mIU/L — ABNORMAL HIGH (ref 0.40–4.50)

## 2019-03-18 LAB — VITAMIN D 25 HYDROXY (VIT D DEFICIENCY, FRACTURES): VIT D 25 HYDROXY: 17 ng/mL — AB (ref 30–100)

## 2019-03-26 ENCOUNTER — Telehealth: Payer: Self-pay | Admitting: *Deleted

## 2019-03-26 NOTE — Telephone Encounter (Signed)
Called pt no answer °

## 2019-03-26 NOTE — Telephone Encounter (Signed)
Pt called stating she missed her results from her lab work phone call yesterday would like a call back

## 2019-04-02 ENCOUNTER — Other Ambulatory Visit: Payer: Self-pay

## 2019-04-02 DIAGNOSIS — E039 Hypothyroidism, unspecified: Secondary | ICD-10-CM

## 2019-04-02 MED ORDER — LEVOTHYROXINE SODIUM 50 MCG PO TABS: 50.0000 ug | ORAL_TABLET | Freq: Every day | ORAL | 2 refills | Status: DC

## 2019-04-03 ENCOUNTER — Other Ambulatory Visit: Payer: Self-pay

## 2019-04-03 ENCOUNTER — Ambulatory Visit: Payer: Medicare Other | Admitting: Family Medicine

## 2019-04-05 ENCOUNTER — Encounter: Payer: Self-pay | Admitting: Orthopaedic Surgery

## 2019-04-05 ENCOUNTER — Encounter: Payer: Self-pay | Admitting: Orthopedic Surgery

## 2019-04-07 ENCOUNTER — Other Ambulatory Visit: Payer: Self-pay

## 2019-04-07 MED ORDER — ALENDRONATE SODIUM 70 MG PO TABS: 70.0000 mg | ORAL_TABLET | ORAL | 1 refills | Status: DC

## 2019-04-09 ENCOUNTER — Ambulatory Visit: Payer: Medicare Other | Admitting: Family Medicine

## 2019-04-09 ENCOUNTER — Other Ambulatory Visit: Payer: Self-pay

## 2019-04-09 ENCOUNTER — Ambulatory Visit (INDEPENDENT_AMBULATORY_CARE_PROVIDER_SITE_OTHER): Payer: Medicare Other | Admitting: Family Medicine

## 2019-04-09 ENCOUNTER — Encounter: Payer: Self-pay | Admitting: Family Medicine

## 2019-04-09 DIAGNOSIS — Z0000 Encounter for general adult medical examination without abnormal findings: Secondary | ICD-10-CM | POA: Diagnosis not present

## 2019-04-09 NOTE — Patient Instructions (Signed)
[name] , Thank you for taking time to come for your Medicare Wellness Visit. I appreciate your ongoing commitment to your health goals. Please review the following plan we discussed and let me know if I can assist you in the future.   Screening recommendations/referrals: Colonoscopy: up to date Mammogram: up to date Bone Density: up to date Recommended yearly ophthalmology/optometry visit for glaucoma screening and checkup Recommended yearly dental visit for hygiene and checkup  Vaccinations: Influenza vaccine: Due Oct 2020 Pneumococcal vaccine: Up to date Tdap vaccine: Up to date Shingles vaccine: Discuss with Dr Moshe Cipro, review coverage with insurance  Preventive Care 54 Years and Older, Female Preventive care refers to lifestyle choices and visits with your health care provider that can promote health and wellness. What does preventive care include?  A yearly physical exam. This is also called an annual well check.  Dental exams once or twice a year.  Routine eye exams. Ask your health care provider how often you should have your eyes checked.  Personal lifestyle choices, including:  Daily care of your teeth and gums.  Regular physical activity.  Eating a healthy diet.  Avoiding tobacco and drug use.  Limiting alcohol use.  Practicing safe sex.  Taking low-dose aspirin every day.  Taking vitamin and mineral supplements as recommended by your health care provider. What happens during an annual well check? The services and screenings done by your health care provider during your annual well check will depend on your age, overall health, lifestyle risk factors, and family history of disease. Counseling  Your health care provider may ask you questions about your:  Alcohol use.  Tobacco use.  Drug use.  Emotional well-being.  Home and relationship well-being.  Sexual activity.  Eating habits.  History of falls.  Memory and ability to understand  (cognition).  Work and work Statistician.  Reproductive health. Screening  You may have the following tests or measurements:  Height, weight, and BMI.  Blood pressure.  Lipid and cholesterol levels. These may be checked every 5 years, or more frequently if you are over 4 years old.  Skin check.  Lung cancer screening. You may have this screening every year starting at age 104 if you have a 30-pack-year history of smoking and currently smoke or have quit within the past 15 years.  Fecal occult blood test (FOBT) of the stool. You may have this test every year starting at age 53.  Flexible sigmoidoscopy or colonoscopy. You may have a sigmoidoscopy every 5 years or a colonoscopy every 10 years starting at age 65.  Hepatitis C blood test.  Hepatitis B blood test.  Sexually transmitted disease (STD) testing.  Diabetes screening. This is done by checking your blood sugar (glucose) after you have not eaten for a while (fasting). You may have this done every 1-3 years.  Bone density scan. This is done to screen for osteoporosis. You may have this done starting at age 42.  Mammogram. This may be done every 1-2 years. Talk to your health care provider about how often you should have regular mammograms. Talk with your health care provider about your test results, treatment options, and if necessary, the need for more tests. Vaccines  Your health care provider may recommend certain vaccines, such as:  Influenza vaccine. This is recommended every year.  Tetanus, diphtheria, and acellular pertussis (Tdap, Td) vaccine. You may need a Td booster every 10 years.  Zoster vaccine. You may need this after age 73.  Pneumococcal 13-valent  conjugate (PCV13) vaccine. One dose is recommended after age 53.  Pneumococcal polysaccharide (PPSV23) vaccine. One dose is recommended after age 18. Talk to your health care provider about which screenings and vaccines you need and how often you need them.  This information is not intended to replace advice given to you by your health care provider. Make sure you discuss any questions you have with your health care provider. Document Released: 12/23/2015 Document Revised: 08/15/2016 Document Reviewed: 09/27/2015 Elsevier Interactive Patient Education  2017 [employer] Prevention in the Home Falls can cause injuries. They can happen to people of all ages. There are many things you can do to make your home safe and to help prevent falls. What can I do on the outside of my home?  Regularly fix the edges of walkways and driveways and fix any cracks.  Remove anything that might make you trip as you walk through a door, such as a raised step or threshold.  Trim any bushes or trees on the path to your home.  Use bright outdoor lighting.  Clear any walking paths of anything that might make someone trip, such as rocks or tools.  Regularly check to see if handrails are loose or broken. Make sure that both sides of any steps have handrails.  Any raised decks and porches should have guardrails on the edges.  Have any leaves, snow, or ice cleared regularly.  Use sand or salt on walking paths during winter.  Clean up any spills in your garage right away. This includes oil or grease spills. What can I do in the bathroom?  Use night lights.  Install grab bars by the toilet and in the tub and shower. Do not use towel bars as grab bars.  Use non-skid mats or decals in the tub or shower.  If you need to sit down in the shower, use a plastic, non-slip stool.  Keep the floor dry. Clean up any water that spills on the floor as soon as it happens.  Remove soap buildup in the tub or shower regularly.  Attach bath mats securely with double-sided non-slip rug tape.  Do not have throw rugs and other things on the floor that can make you trip. What can I do in the bedroom?  Use night lights.  Make sure that you have a light by your bed  that is easy to reach.  Do not use any sheets or blankets that are too big for your bed. They should not hang down onto the floor.  Have a firm chair that has side arms. You can use this for support while you get dressed.  Do not have throw rugs and other things on the floor that can make you trip. What can I do in the kitchen?  Clean up any spills right away.  Avoid walking on wet floors.  Keep items that you use a lot in easy-to-reach places.  If you need to reach something above you, use a strong step stool that has a grab bar.  Keep electrical cords out of the way.  Do not use floor polish or wax that makes floors slippery. If you must use wax, use non-skid floor wax.  Do not have throw rugs and other things on the floor that can make you trip. What can I do with my stairs?  Do not leave any items on the stairs.  Make sure that there are handrails on both sides of the stairs and use them. Fix handrails  that are broken or loose. Make sure that handrails are as long as the stairways.  Check any carpeting to make sure that it is firmly attached to the stairs. Fix any carpet that is loose or worn.  Avoid having throw rugs at the top or bottom of the stairs. If you do have throw rugs, attach them to the floor with carpet tape.  Make sure that you have a light switch at the top of the stairs and the bottom of the stairs. If you do not have them, ask someone to add them for you. What else can I do to help prevent falls?  Wear shoes that:  Do not have high heels.  Have rubber bottoms.  Are comfortable and fit you well.  Are closed at the toe. Do not wear sandals.  If you use a stepladder:  Make sure that it is fully opened. Do not climb a closed stepladder.  Make sure that both sides of the stepladder are locked into place.  Ask someone to hold it for you, if possible.  Clearly mark and make sure that you can see:  Any grab bars or handrails.  First and last  steps.  Where the edge of each step is.  Use tools that help you move around (mobility aids) if they are needed. These include:  Canes.  Walkers.  Scooters.  Crutches.  Turn on the lights when you go into a dark area. Replace any light bulbs as soon as they burn out.  Set up your furniture so you have a clear path. Avoid moving your furniture around.  If any of your floors are uneven, fix them.  If there are any pets around you, be aware of where they are.  Review your medicines with your doctor. Some medicines can make you feel dizzy. This can increase your chance of falling. Ask your doctor what other things that you can do to help prevent falls. This information is not intended to replace advice given to you by your health care provider. Make sure you discuss any questions you have with your health care provider. Document Released: 09/22/2009 Document Revised: 05/03/2016 Document Reviewed: 12/31/2014 Elsevier Interactive Patient Education  2017 [employer].

## 2019-04-09 NOTE — Progress Notes (Signed)
Subjective:   Bianca Mcguire is a 73 y.o. female who presents for Medicare Annual (Subsequent) preventive examination.  Location of Patient: Home Location of Provider: Telehealth Consent was obtain for visit to be over via telehealth.  I verified that I am speaking with the correct person using two identifiers.   Review of Systems:   Cardiac Risk Factors include: advanced age (>76men, >30 women);obesity (BMI >30kg/m2)     Objective:     Vitals: There were no vitals taken for this visit.  There is no height or weight on file to calculate BMI.  Advanced Directives 10/16/2018 10/13/2018 09/26/2017 09/16/2017 01/07/2017  Does Patient Have a Medical Advance Directive? No No No No No  Would patient like information on creating a medical advance directive? No - Patient declined No - Patient declined No - Patient declined No - Patient declined Yes (MAU/Ambulatory/Procedural Areas - Information given)    Tobacco Social History   Tobacco Use  Smoking Status Former Smoker  . Packs/day: 2.00  . Years: 25.00  . Pack years: 50.00  . Types: Cigarettes  . Last attempt to quit: 05/12/2014  . Years since quitting: 4.9  Smokeless Tobacco Never Used     Counseling given: Not Answered   Clinical Intake:  Pre-visit preparation completed: Yes  Pain : No/denies pain     Nutritional Risks: None Diabetes: No  How often do you need to have someone help you when you read instructions, pamphlets, or other written materials from your doctor or pharmacy?: 1 - Never What is the last grade level you completed in school?: 12  Interpreter Needed?: No     Past Medical History:  Diagnosis Date  . Anemia   . Arthritis   . Carpal tunnel syndrome, bilateral   . Diverticulosis dx 2011  . GERD (gastroesophageal reflux disease)   . Headache   . Hypertension 06/02/2018  . Low back pain   . Obesity   . Rheumatoid arthritis(714.0)    Past Surgical History:  Procedure Laterality Date  .  bilateral foot surgery    . COLONOSCOPY  2011   Dr. Oneida Alar: 53mm sessile polyp (tubular adenoma), pancolonic diverticulosis  . COLONOSCOPY N/A 09/26/2017   Procedure: COLONOSCOPY;  Surgeon: Danie Binder, MD;  Location: [facility];  Service: Endoscopy;  Laterality: N/A;  7:30am  . ESOPHAGOGASTRODUODENOSCOPY N/A 09/16/2017   Procedure: ESOPHAGOGASTRODUODENOSCOPY (EGD);  Surgeon: Danie Binder, MD;  Location: [facility];  Service: Endoscopy;  Laterality: N/A;  . FLEXIBLE SIGMOIDOSCOPY N/A 09/16/2017   Procedure: FLEXIBLE SIGMOIDOSCOPY;  Surgeon: Danie Binder, MD;  Location: [facility];  Service: Endoscopy;  Laterality: N/A;  . [place]  . KNEE ARTHROSCOPY WITH LATERAL MENISECTOMY Right 10/16/2018   Procedure: KNEE ARTHROSCOPY WITH LATERAL MENISCECTOMY;  Surgeon: Carole Civil, MD;  Location: [facility];  Service: Orthopedics;  Laterality: Right;  . OOPHORECTOMY Right   . roophorectomy for benign disease  March 2011   Dr. Glo Herring  . tonsillectomy and adenoidctomy in childhood    . TUBAL LIGATION  1977   Family History  Problem Relation Age of Onset  . Heart failure Mother   . Cancer Mother 53       oral  . Heart failure Father   . Arthritis Sister   . Heart attack Brother   . Thyroid disease Daughter   . Hypertension Daughter   . Hypertension Daughter   . Thyroid disease Daughter   . Heart failure Sister   .  Diabetes Sister   . Hypertension Sister   . Leukemia Brother   . Colon cancer Neg Hx   . Colon polyps Neg Hx    Social History   Socioeconomic History  . Marital status: Widowed    Spouse name: Not on file  . Number of children: 3  . Years of education: Not on file  . Highest education level: Not on file  Occupational History  . Occupation: retired    Comment: part time   . Occupation: disabled for arthritis     Comment: since age 67    Employer: [employer]  Social Needs  . Financial resource strain: Not on file  . Food insecurity:     Worry: Not on file    Inability: Not on file  . Transportation needs:    Medical: Not on file    Non-medical: Not on file  Tobacco Use  . Smoking status: Former Smoker    Packs/day: 2.00    Years: 25.00    Pack years: 50.00    Types: Cigarettes    Last attempt to quit: 05/12/2014    Years since quitting: 4.9  . Smokeless tobacco: Never Used  Substance and Sexual Activity  . Alcohol use: No  . Drug use: No  . Sexual activity: Not Currently  Lifestyle  . Physical activity:    Days per week: Not on file    Minutes per session: Not on file  . Stress: Not on file  Relationships  . Social connections:    Talks on phone: Not on file    Gets together: Not on file    Attends religious service: Not on file    Active member of club or organization: Not on file    Attends meetings of clubs or organizations: Not on file    Relationship status: Not on file  Other Topics Concern  . Not on file  Social History Narrative   Lives with Granddaughter since 2006    Outpatient Encounter Medications as of 04/09/2019  Medication Sig  . acetaminophen (TYLENOL) 500 MG tablet One twice daily for arthritic pain (Patient taking differently: Take 1,000 mg by mouth every 4 (four) hours as needed for mild pain. One twice daily for arthritic pain)  . alendronate (FOSAMAX) 70 MG tablet Take 1 tablet (70 mg total) by mouth every 7 (seven) days. Take with a full glass of water on an empty stomach.  Marland Kitchen amLODipine (NORVASC) 2.5 MG tablet Take 1 tablet (2.5 mg total) by mouth daily.  . Calcium Carb-Cholecalciferol (CALCIUM 600 + D PO) Take 1 tablet by mouth every evening.   . cyclobenzaprine (FLEXERIL) 5 MG tablet Take one tablet twice weekly as needed, for back spasm  . folic acid (FOLVITE) 458 MCG tablet Take 400 mcg by mouth every evening.   Marland Kitchen IRON-B12-VITAMINS PO Take 500 mcg by mouth every evening.   Marland Kitchen levothyroxine (SYNTHROID) 50 MCG tablet Take 1 tablet (50 mcg total) by mouth daily.  . Multiple  Vitamins-Iron (MULTIVITAMINS WITH IRON) TABS tablet Take 1 tablet by mouth every evening.  . neomycin-polymyxin-hydrocortisone (CORTISPORIN) OTIC solution Apply 1 to 2 drops to toe BID after soaking  . omeprazole (PRILOSEC) 20 MG capsule TAKE 1 CAPSULE BY MOUTH 30  MINUTES PRIOR TO BREAKFAST.   No facility-administered encounter medications on file as of 04/09/2019.     Activities of Daily Living In your present state of health, do you have any difficulty performing the following activities: 04/09/2019 10/13/2018  Hearing? N N  Vision? N N  Difficulty concentrating or making decisions? N N  Walking or climbing stairs? N N  Dressing or bathing? N N  Doing errands, shopping? N N  Preparing Food and eating ? N -  Using the Toilet? N -  In the past six months, have you accidently leaked urine? N -  Do you have problems with loss of bowel control? N -  Managing your Medications? N -  Managing your Finances? N -  Housekeeping or managing your Housekeeping? N -  Some recent data might be hidden    Patient Care Team: Fayrene Helper, MD as PCP - [facility], Marga Melnick, MD as Consulting Physician (Gastroenterology)    Assessment:   This is a routine wellness examination for [facility].  Exercise Activities and Dietary recommendations Current Exercise Habits: The patient does not participate in regular exercise at present, Exercise limited by: None identified  Goals    . Exercise 3x per week (30 min per time)     Patient would like to start exercising 3 times a week for at least 30 minutes at a time.       Fall Risk Fall Risk  04/09/2019 02/24/2019 08/04/2018 06/02/2018 01/07/2017  Falls in the past year? 0 0 No No No  Injury with Fall? 0 0 - - -   Is the patient's home free of loose throw rugs in walkways, pet beds, electrical cords, etc?   yes      Grab bars in the bathroom? yes      Handrails on the stairs?   in process      Adequate lighting?   yes  Timed Get Up and Go  performed: no performed telephone visit  Depression Screen PHQ 2/9 Scores 04/09/2019 02/24/2019 08/04/2018 06/02/2018  PHQ - 2 Score 0 0 0 0  PHQ- 9 Score - - - 5     Cognitive Function     6CIT Screen 04/09/2019 01/07/2017  What Year? 0 points -  What month? 0 points 0 points  What time? 0 points 0 points  Count back from 20 0 points 0 points  Months in reverse 0 points 0 points  Repeat phrase 0 points 0 points  Total Score 0 -    Immunization History  Administered Date(s) Administered  . Influenza Whole 09/23/2008  . Influenza,inj,Quad PF,6+ Mos 08/28/2013, 01/07/2017, 08/04/2018  . PPD Test 08/28/2013  . Pneumococcal Conjugate-13 07/05/2014  . Pneumococcal Polysaccharide-23 09/07/2008, 09/15/2013    Qualifies for Shingles Vaccine? Yes, discuss with Dr Moshe Cipro  Screening Tests Health Maintenance  Topic Date Due  . TETANUS/TDAP  08/27/2019 (Originally 07/24/1965)  . INFLUENZA VACCINE  07/11/2019  . MAMMOGRAM  06/16/2020  . COLONOSCOPY  09/26/2022  . DEXA SCAN  Completed  . Hepatitis C Screening  Completed  . PNA vac Low Risk Adult  Completed    Cancer Screenings: Lung: Low Dose CT Chest recommended if Age 37-80 years, 30 pack-year currently smoking OR have quit w/in 15years. Patient does not qualify. Breast:  Up to date on Mammogram? Yes   Up to date of Bone Density/Dexa? Yes Colorectal: Up to date   Additional Screenings:  Hepatitis C Screening: Completed     Plan:     1. Encounter for Medicare annual wellness exam I have personally reviewed and noted the following in the patient's chart:   . Medical and social history . Use of alcohol, tobacco or illicit drugs  . Current medications and supplements . Functional ability and  status . Nutritional status . Physical activity . Advanced directives . List of other physicians . Hospitalizations, surgeries, and ER visits in previous 12 months . Vitals . Screenings to include cognitive, depression, and falls .  Referrals and appointments  In addition, I have reviewed and discussed with patient certain preventive protocols, quality metrics, and best practice recommendations. A written personalized care plan for preventive services as well as general preventive health recommendations were provided to patient.    I provided 20 minutes of non-face-to-face time during this encounter.  Perlie Mayo, NP  04/09/2019

## 2019-05-13 DIAGNOSIS — E039 Hypothyroidism, unspecified: Secondary | ICD-10-CM | POA: Diagnosis not present

## 2019-05-14 LAB — TSH: TSH: 3.51 mIU/L (ref 0.40–4.50)

## 2019-06-24 DIAGNOSIS — H25813 Combined forms of age-related cataract, bilateral: Secondary | ICD-10-CM | POA: Diagnosis not present

## 2019-06-24 DIAGNOSIS — D23111 Other benign neoplasm of skin of right upper eyelid, including canthus: Secondary | ICD-10-CM | POA: Diagnosis not present

## 2019-07-01 DIAGNOSIS — D23111 Other benign neoplasm of skin of right upper eyelid, including canthus: Secondary | ICD-10-CM | POA: Diagnosis not present

## 2019-07-01 DIAGNOSIS — L820 Inflamed seborrheic keratosis: Secondary | ICD-10-CM | POA: Diagnosis not present

## 2019-07-14 ENCOUNTER — Other Ambulatory Visit: Payer: Self-pay | Admitting: Family Medicine

## 2019-08-04 ENCOUNTER — Other Ambulatory Visit: Payer: Self-pay

## 2019-08-04 ENCOUNTER — Emergency Department (HOSPITAL_COMMUNITY)
Admission: EM | Admit: 2019-08-04 | Discharge: 2019-08-04 | Disposition: A | Discharge status: Home / Self Care | Payer: Medicare Other | Attending: Emergency Medicine | Admitting: Emergency Medicine

## 2019-08-04 ENCOUNTER — Emergency Department (HOSPITAL_COMMUNITY): Payer: Medicare Other

## 2019-08-04 ENCOUNTER — Encounter (HOSPITAL_COMMUNITY): Payer: Self-pay | Admitting: Emergency Medicine

## 2019-08-04 DIAGNOSIS — Z87891 Personal history of nicotine dependence: Secondary | ICD-10-CM | POA: Diagnosis not present

## 2019-08-04 DIAGNOSIS — E039 Hypothyroidism, unspecified: Secondary | ICD-10-CM | POA: Insufficient documentation

## 2019-08-04 DIAGNOSIS — Z79899 Other long term (current) drug therapy: Secondary | ICD-10-CM | POA: Diagnosis not present

## 2019-08-04 DIAGNOSIS — I10 Essential (primary) hypertension: Secondary | ICD-10-CM | POA: Diagnosis not present

## 2019-08-04 DIAGNOSIS — R531 Weakness: Secondary | ICD-10-CM | POA: Diagnosis not present

## 2019-08-04 DIAGNOSIS — R51 Headache: Secondary | ICD-10-CM | POA: Insufficient documentation

## 2019-08-04 LAB — COMPREHENSIVE METABOLIC PANEL
ALT: 20 U/L (ref 0–44)
AST: 29 U/L (ref 15–41)
Albumin: 3.7 g/dL (ref 3.5–5.0)
Alkaline Phosphatase: 89 U/L (ref 38–126)
Anion gap: 11 (ref 5–15)
BUN: 19 mg/dL (ref 8–23)
CO2: 24 mmol/L (ref 22–32)
Calcium: 9.7 mg/dL (ref 8.9–10.3)
Chloride: 102 mmol/L (ref 98–111)
Creatinine, Ser: 0.54 mg/dL (ref 0.44–1.00)
GFR calc Af Amer: >60 mL/min (ref >60)
GFR calc non Af Amer: >60 mL/min (ref >60)
Glucose, Bld: 124 mg/dL — ABNORMAL HIGH (ref 70–99)
Potassium: 4.1 mmol/L (ref 3.5–5.1)
Sodium: 137 mmol/L (ref 135–145)
Total Bilirubin: 0.7 mg/dL (ref 0.3–1.2)
Total Protein: 7.1 g/dL (ref 6.5–8.1)

## 2019-08-04 LAB — CBG MONITORING, ED: Glucose-Capillary: 109 mg/dL — ABNORMAL HIGH (ref 70–99)

## 2019-08-04 LAB — URINALYSIS, ROUTINE W REFLEX MICROSCOPIC
Bilirubin Urine: NEGATIVE
Glucose, UA: NEGATIVE mg/dL
Hgb urine dipstick: NEGATIVE
Ketones, ur: NEGATIVE mg/dL
Leukocytes,Ua: NEGATIVE
Nitrite: NEGATIVE
Protein, ur: NEGATIVE mg/dL
Specific Gravity, Urine: 1.026 (ref 1.005–1.030)
pH: 6 (ref 5.0–8.0)

## 2019-08-04 LAB — CBC
HCT: 36.6 % (ref 36.0–46.0)
Hemoglobin: 10.9 g/dL — ABNORMAL LOW (ref 12.0–15.0)
MCH: 27 pg (ref 26.0–34.0)
MCHC: 29.8 g/dL — ABNORMAL LOW (ref 30.0–36.0)
MCV: 90.6 fL (ref 80.0–100.0)
Platelets: 229 10*3/uL (ref 150–400)
RBC: 4.04 MIL/uL (ref 3.87–5.11)
RDW: 14.2 % (ref 11.5–15.5)
WBC: 9.8 10*3/uL (ref 4.0–10.5)
nRBC: 0 % (ref 0.0–0.2)

## 2019-08-04 MED ORDER — SODIUM CHLORIDE 0.9% FLUSH: 3.0000 mL | Freq: Once | INTRAVENOUS | Status: DC

## 2019-08-04 MED ORDER — FENTANYL CITRATE (PF) 100 MCG/2ML IJ SOLN
50.0000 ug | Freq: Once | INTRAMUSCULAR | Status: AC
  Administered 2019-08-04: 23:00:00 50 ug via INTRAVENOUS
  Filled 2019-08-04: qty 2

## 2019-08-04 MED ORDER — FENTANYL CITRATE (PF) 100 MCG/2ML IJ SOLN
50.0000 ug | Freq: Once | INTRAMUSCULAR | Status: AC
  Administered 2019-08-04: 50 ug via INTRAVENOUS
  Filled 2019-08-04: qty 2

## 2019-08-04 MED ORDER — SODIUM CHLORIDE 0.9 % IV BOLUS
500.0000 mL | Freq: Once | INTRAVENOUS | Status: AC
  Administered 2019-08-04: 21:00:00 500 mL via INTRAVENOUS

## 2019-08-04 NOTE — ED Triage Notes (Signed)
Pt presents to ED with complaints of left arm pain and bilateral leg weakness that started yesterday about 1400. Pt also complaining of headache.

## 2019-08-04 NOTE — Discharge Instructions (Signed)
Test showed no life-threatening condition.  Increase fluids.  Try to eat regular meals.  Tylenol or ibuprofen for pain.  Follow-up with your primary care doctor.

## 2019-08-04 NOTE — ED Notes (Signed)
Pt reports her symptoms started at 0230 this morning not at 1400 yesterday. Pt reports the pain in her left arm and bilateral legs woke her up out of her sleep. Pt reports she then attempted to get out of bed, but couldn't due to weakness and pain.

## 2019-08-05 NOTE — ED Provider Notes (Signed)
[facility] EMERGENCY DEPARTMENT Provider Note   CSN: [id number] Arrival date & time: 08/04/19  1702     History   Chief Complaint Chief Complaint  Patient presents with  . Weakness    HPI Bianca Mcguire is a 73 y.o. female.     Chief complaint weakness and pain particularly in her left arm.  Symptoms started approximately 0230 today.  Known history of rheumatoid arthritis which oftentimes flares up.  No fever, chills, chest pain, dyspnea, dysuria, neurological deficits.  She normally is able to do her ADLs, but today has been more bedridden.  No known Covid exposures.     Past Medical History:  Diagnosis Date  . Anemia   . Arthritis   . Carpal tunnel syndrome, bilateral   . Diverticulosis dx 2011  . GERD (gastroesophageal reflux disease)   . Headache   . Hypertension 06/02/2018  . Low back pain   . Obesity   . Rheumatoid arthritis(714.0)     Patient Active Problem List   Diagnosis Date Noted  . S/P arthroscopy of right knee 10/16/18 10/23/2018  . Meniscus, lateral, derangement, right   . Primary osteoarthritis of right knee   . Back spasm 08/11/2018  . Essential hypertension 06/02/2018  . Overweight (BMI 25.0-29.9) 06/02/2018  . Anemia   . Normocytic anemia, not due to blood loss 07/26/2017  . Vitamin B12 deficiency 07/26/2017  . Shoulder pain, left 05/22/2017  . Tubular adenoma of colon 07/17/2015  . H/O nicotine dependence 07/17/2015  . Unsteady gait 07/05/2014  . Lumbar back pain with radiculopathy affecting right lower extremity 06/07/2014  . Osteoporosis 04/21/2014  . IGT (impaired glucose tolerance) 09/20/2013  . Adult hypothyroidism 02/02/2011  . Knee pain, right 01/30/2011  . Hyperlipidemia 05/03/2010  . SHOULDER PAIN, LEFT 05/03/2010  . Vitamin D deficiency 01/10/2010  . OSTEOARTHRITIS, KNEES, BILATERAL 01/10/2010  . Fatigue 01/10/2010  . Nephrolithiasis, left 01/10/2010  . HAND PAIN, BILATERAL 11/17/2008  . VAGINITIS, ATROPHIC 10/05/2008   . CARPAL TUNNEL SYNDROME, BILATERAL 09/07/2008  . RHEUMATOID ARTHRITIS 09/07/2008  . LOW BACK PAIN 09/07/2008    Past Surgical History:  Procedure Laterality Date  . bilateral foot surgery    . COLONOSCOPY  2011   Dr. Oneida Alar: 13mm sessile polyp (tubular adenoma), pancolonic diverticulosis  . COLONOSCOPY N/A 09/26/2017   Procedure: COLONOSCOPY;  Surgeon: Danie Binder, MD;  [facility]: [facility];  Service: Endoscopy;  Laterality: N/A;  7:30am  . ESOPHAGOGASTRODUODENOSCOPY N/A 09/16/2017   Procedure: ESOPHAGOGASTRODUODENOSCOPY (EGD);  Surgeon: Danie Binder, MD;  Location: [facility];  Service: Endoscopy;  Laterality: N/A;  . FLEXIBLE SIGMOIDOSCOPY N/A 09/16/2017   Procedure: FLEXIBLE SIGMOIDOSCOPY;  Surgeon: Danie Binder, MD;  Location: [facility];  Service: Endoscopy;  Laterality: N/A;  . [place]  . KNEE ARTHROSCOPY WITH LATERAL MENISECTOMY Right 10/16/2018   Procedure: KNEE ARTHROSCOPY WITH LATERAL MENISCECTOMY;  Surgeon: Carole Civil, MD;  Location: [facility];  Service: Orthopedics;  Laterality: Right;  . OOPHORECTOMY Right   . roophorectomy for benign disease  March 2011   Dr. Glo Herring  . tonsillectomy and adenoidctomy in childhood    . TUBAL LIGATION  1977     OB History   No obstetric history on file.      Home Medications    Prior to Admission medications   Medication Sig Start Date End Date Taking? Authorizing Provider  acetaminophen (TYLENOL) 500 MG tablet One twice daily for arthritic pain Patient taking differently: Take  1,000 mg by mouth every 4 (four) hours as needed for mild pain. One twice daily for arthritic pain 06/07/14   Fayrene Helper, MD  alendronate (FOSAMAX) 70 MG tablet Take 1 tablet (70 mg total) by mouth every 7 (seven) days. Take with a full glass of water on an empty stomach. 04/07/19   Fayrene Helper, MD  amLODipine (NORVASC) 2.5 MG tablet Take 1 tablet (2.5 mg total) by mouth daily. 08/04/18   Fayrene Helper, MD  Calcium Carb-Cholecalciferol (CALCIUM 600 + D PO) Take 1 tablet by mouth every evening.     [provider]  cyclobenzaprine (FLEXERIL) 5 MG tablet Take one tablet twice weekly as needed, for back spasm 02/24/19   Fayrene Helper, MD  folic acid (FOLVITE) Q000111Q MCG tablet Take 400 mcg by mouth every evening.     [provider]  IRON-B12-VITAMINS PO Take 500 mcg by mouth every evening.     [provider]  levothyroxine (SYNTHROID) 50 MCG tablet TAKE 1 TABLET BY MOUTH EVERY DAY 07/15/19   Fayrene Helper, MD  Multiple Vitamins-Iron (MULTIVITAMINS WITH IRON) TABS tablet Take 1 tablet by mouth every evening.    [provider]  neomycin-polymyxin-hydrocortisone (CORTISPORIN) OTIC solution Apply 1 to 2 drops to toe BID after soaking 02/26/19   Regal, Tamala Fothergill, DPM  omeprazole (PRILOSEC) 20 MG capsule TAKE 1 CAPSULE BY MOUTH 30  MINUTES PRIOR TO BREAKFAST. 10/14/18   Fayrene Helper, MD    Family History Family History  Problem Relation Age of Onset  . Heart failure Mother   . Cancer Mother 19       oral  . Heart failure Father   . Arthritis Sister   . Heart attack Brother   . Thyroid disease Daughter   . Hypertension Daughter   . Hypertension Daughter   . Thyroid disease Daughter   . Heart failure Sister   . Diabetes Sister   . Hypertension Sister   . Leukemia Brother   . Colon cancer Neg Hx   . Colon polyps Neg Hx     Social History Social History   Tobacco Use  . Smoking status: Former Smoker    Packs/day: 2.00    Years: 25.00    Pack years: 50.00    Types: Cigarettes    Quit date: 05/12/2014    Years since quitting: 5.2  . Smokeless tobacco: Never Used  Substance Use Topics  . Alcohol use: No  . Drug use: No     Allergies   Patient has no known allergies.   Review of Systems Review of Systems  All other systems reviewed and are negative.    Physical Exam Updated Vital Signs BP (!) 105/45   Pulse 75    Temp 99.2 F (37.3 C) (Oral)   Resp (!) 23   Ht 5\' 2"  (1.575 m)   Wt 70.8 kg   SpO2 96%   BMI 28.53 kg/m   Physical Exam Vitals signs and nursing note reviewed.  Constitutional:      Appearance: She is well-developed.  HENT:     Head: Normocephalic and atraumatic.  Eyes:     Conjunctiva/sclera: Conjunctivae normal.  Neck:     Musculoskeletal: Neck supple.  Cardiovascular:     Rate and Rhythm: Normal rate and regular rhythm.  Pulmonary:     Effort: Pulmonary effort is normal.     Breath sounds: Normal breath sounds.  Abdominal:     General: Bowel  sounds are normal.     Palpations: Abdomen is soft.  Musculoskeletal: Normal range of motion.     Comments: Patient is able to move all 4 extremities.  Left arm is weak, but this is not abnormal.  Skin:    General: Skin is warm and dry.  Neurological:     Mental Status: She is alert and oriented to person, place, and time.  Psychiatric:        Behavior: Behavior normal.      ED Treatments / Results  Labs (all labs ordered are listed, but only abnormal results are displayed) Labs Reviewed  CBC - Abnormal; Notable for the following components:      Result Value   Hemoglobin 10.9 (*)    MCHC 29.8 (*)    All other components within normal limits  COMPREHENSIVE METABOLIC PANEL - Abnormal; Notable for the following components:   Glucose, Bld 124 (*)    All other components within normal limits  CBG MONITORING, ED - Abnormal; Notable for the following components:   Glucose-Capillary 109 (*)    All other components within normal limits  URINALYSIS, ROUTINE W REFLEX MICROSCOPIC    EKG EKG Interpretation  Date/Time:  Tuesday August 04 2019 17:35:52 EDT Ventricular Rate:  94 PR Interval:  132 QRS Duration: 82 QT Interval:  334 QTC Calculation: 417 R Axis:   26 Text Interpretation:  Normal sinus rhythm Possible Left atrial enlargement Borderline ECG Confirmed by Nat Christen [id number]) on 08/04/2019 6:11:14 PM    Radiology Ct Head Wo Contrast  Result Date: 08/04/2019 CLINICAL DATA:  73 year old female with focal neurologic deficit and headache. EXAM: CT HEAD WITHOUT CONTRAST TECHNIQUE: Contiguous axial images were obtained from the base of the skull through the vertex without intravenous contrast. COMPARISON:  None. FINDINGS: Brain: Mild age-related atrophy and chronic microvascular ischemic changes. There is no acute intracranial hemorrhage. No mass effect or midline shift. No extra-axial fluid collection. Vascular: No hyperdense vessel or unexpected calcification. Skull: Normal. Negative for fracture or focal lesion. Sinuses/Orbits: Partial opacification the sphenoid sinuses. The remainder of the visualized paranasal sinuses and left mastoid air cells are clear. Mild right mastoid effusion. Other: None IMPRESSION: 1. No acute intracranial hemorrhage. 2. Mild age-related atrophy and chronic microvascular ischemic changes. Electronically Signed   By: Anner Crete M.D.   On: 08/04/2019 20:25    Procedures Procedures (including critical care time)  Medications Ordered in ED Medications  fentaNYL (SUBLIMAZE) injection 50 mcg (50 mcg Intravenous Given 08/04/19 2051)  sodium chloride 0.9 % bolus 500 mL (0 mLs Intravenous Stopped 08/04/19 2124)  fentaNYL (SUBLIMAZE) injection 50 mcg (50 mcg Intravenous Given 08/04/19 2251)     Initial Impression / Assessment and Plan / ED Course  I have reviewed the triage vital signs and the nursing notes.  Pertinent labs & imaging results that were available during my care of the patient were reviewed by me and considered in my medical decision making (see chart for details).        Uncertain etiology of patient's weakness.  She is alert and conversant.  She does not appear ill.  Screening tests including head CT, labs, urinalysis, EKG showed no acute findings.  Patient understands to return if symptoms worsen.  Final Clinical Impressions(s) / ED Diagnoses   Final  diagnoses:  Weakness    ED Discharge Orders    None       Nat Christen, MD 08/05/19 [phone]

## 2019-08-06 ENCOUNTER — Telehealth: Payer: Self-pay | Admitting: *Deleted

## 2019-08-06 ENCOUNTER — Other Ambulatory Visit: Payer: Self-pay

## 2019-08-06 DIAGNOSIS — I10 Essential (primary) hypertension: Secondary | ICD-10-CM

## 2019-08-06 MED ORDER — AMLODIPINE BESYLATE 2.5 MG PO TABS: 2.5000 mg | ORAL_TABLET | Freq: Every day | ORAL | 0 refills | Status: DC

## 2019-08-06 MED ORDER — ALENDRONATE SODIUM 70 MG PO TABS: 70.0000 mg | ORAL_TABLET | ORAL | 1 refills | Status: DC

## 2019-08-06 MED ORDER — OMEPRAZOLE 20 MG PO CPDR: DELAYED_RELEASE_CAPSULE | ORAL | 0 refills | Status: DC

## 2019-08-06 NOTE — Telephone Encounter (Signed)
She is to take on e 50 mcgh tablet ONCE daily based on excellent tsh result in June please explain and, order rept TSH 5 days before her Nov visit, thanks

## 2019-08-06 NOTE — Telephone Encounter (Signed)
Patient states you changed her to twice daily on her thyroid medication, however, instructions still state to take daily. If it has changed can you please send in refill with new directions to take twice daily.

## 2019-08-06 NOTE — Telephone Encounter (Signed)
Patient does not need a TOC call because patient was not hospitalized, she was sent home the same day. She does have an appt to see Bianca Beach, NP on 9/3 for hospital follow up.Medications refilled. Patient states that her pcp changed directions on thyroid med, however directions are not twice daily on sig on prescription. Will sent message to provider asking for clarification.

## 2019-08-06 NOTE — Telephone Encounter (Signed)
[facility] called requesting appointment for hospital follow up pt d/c on 08-04-19 needs a TOC call. Also is out of all her medications that goes to the mail order pharmacy. Her levothyroxine was written for once a day and they moved that that to twice a day so that needs to be changed.

## 2019-08-06 NOTE — Telephone Encounter (Signed)
Tried calling patient to let her know she only needs to be taking 1 tablet of her thyroid tablet based on her last TSH lab. No answer and no vm. Will try again later.

## 2019-08-10 ENCOUNTER — Ambulatory Visit: Payer: Medicare Other

## 2019-08-10 NOTE — Telephone Encounter (Signed)
Called patient to discuss her thyroid medication. No answer. Left generic message requesting call back

## 2019-08-10 NOTE — Telephone Encounter (Signed)
Called patient to discuss medication. No answer. Left message requesting call back.

## 2019-08-11 ENCOUNTER — Other Ambulatory Visit: Payer: Self-pay

## 2019-08-11 MED ORDER — LEVOTHYROXINE SODIUM 50 MCG PO TABS: 50.0000 ug | ORAL_TABLET | Freq: Every day | ORAL | 1 refills | Status: DC

## 2019-08-11 NOTE — Telephone Encounter (Signed)
Spoke with patient and let her know she should only be taking 1 49mcg tablet of synthyroid per day. She verbalized understanding. Refill sent in to mail order pharmacy

## 2019-08-12 ENCOUNTER — Encounter: Payer: Self-pay | Admitting: Family Medicine

## 2019-08-12 ENCOUNTER — Other Ambulatory Visit: Payer: Self-pay

## 2019-08-12 ENCOUNTER — Ambulatory Visit (INDEPENDENT_AMBULATORY_CARE_PROVIDER_SITE_OTHER): Payer: Medicare Other | Admitting: Family Medicine

## 2019-08-12 VITALS — BP 120/72 | Ht 62.0 in | Wt 160.0 lb

## 2019-08-12 DIAGNOSIS — D649 Anemia, unspecified: Secondary | ICD-10-CM | POA: Diagnosis not present

## 2019-08-12 DIAGNOSIS — M069 Rheumatoid arthritis, unspecified: Secondary | ICD-10-CM

## 2019-08-12 DIAGNOSIS — R531 Weakness: Secondary | ICD-10-CM | POA: Diagnosis not present

## 2019-08-12 NOTE — Patient Instructions (Signed)
    I appreciate the opportunity to provide you with the care for your health and wellness. Today we discussed: ED follow-up  Follow-up: Next appointment is in November,  flu clinic prior to that to get flu vaccine earlier in the season  No labs today no referrals today  Rheumatoid arthritis can be debilitating and weakening.  Especially when your immune system flares up in high gear.  You are not currently taking anything for this.  Might be something that we need to consider and talk to Dr. Moshe Cipro about at a future visit.  Especially if you continue to have these types of episodes or have trouble with your activities of daily living such as bathing, dressing, showering shopping and so forth.  Make sure that you stay active and mobile this keeps her joints healthy.  Even though it can cause him to be uncomfortable.  Additionally stretching and walking are very helpful.  But resting as well when you are active and mobile so that you can have recovery time.  Continue to take your multivitamins.  We will need to probably reassess your hemoglobin in the future.  You reported no signs of bleeding if you do start seeing signs of bleedings please let us know.  Please continue to practice social distancing to keep you, your family, and our community safe.  If you must go out, please wear a Mask and practice good handwashing.  [place] YOUR HANDS WELL AND FREQUENTLY. AVOID TOUCHING YOUR FACE, UNLESS YOUR HANDS ARE FRESHLY WASHED.  GET FRESH AIR DAILY. STAY HYDRATED WITH WATER.   It was a pleasure to see you and I look forward to continuing to work together on your health and well-being. Please do not hesitate to call the office if you need care or have questions about your care.  Have a wonderful day and week.  With Gratitude,  Cherly Beach, DNP, AGNP-BC

## 2019-08-12 NOTE — Progress Notes (Signed)
Virtual Visit via Telephone Note   This visit type was conducted due to national recommendations for restrictions regarding the COVID-19 Pandemic (e.g. social distancing) in an effort to limit this patient's exposure and mitigate transmission in our community.  Due to her co-morbid illnesses, this patient is at least at moderate risk for complications without adequate follow up.  This format is felt to be most appropriate for this patient at this time.  The patient did not have access to video technology/had technical difficulties with video requiring transitioning to audio format only (telephone).  All issues noted in this document were discussed and addressed.  No physical exam could be performed with this format.   Evaluation Performed:  Follow-up visit  Date:  08/12/2019   ID:  Bianca Mcguire, DOB 1946/10/14, MRN [id number]  Patient Location: Home Provider Location: Office  Location of Patient: Home Location of Provider: Telehealth Consent was obtain for visit to be over via telehealth. I verified that I am speaking with the correct person using two identifiers.  PCP:  Fayrene Helper, MD   Chief Complaint:  ED f/u  History of Present Illness:    Bianca Mcguire is a 73 y.o. female with history of anemia, arthritis, rheumatoid arthritis, hypertension among others.  Presented to the emergency room back on August 25.  She was having some complaints of weakness and pain particularly in her left arm.  She reported to them that her arthritis does flareup time to time.  She is not on anything consistently for it.  She denied fevers, chills, chest pain, dyspnea, dysuria, any neurological deficits or trouble.  She reported being more bedridden but she normally is able to do her ADLs normally.  And she had not been exposed to [place].  CBC revealed a drop in her hemoglobin at 10.9, her baseline is usually 11-12 but she has been anemic in the past.  Reports that she is taking her  multivitamin with iron.  Additionally she tries to get her vitamin B's as well.  CT of the head was normal with no acute findings.  Did have some age-related atrophy and chronic microvascular ischemic changes.  EKGs were within normal range as well.  She was discharged about 4 hours after arrival.  Today she reports that she is feeling much better she is unsure exactly what had happened.  She reports that she has been trying to take care of herself especially when her rheumatoid arthritis flares up.  She thinks that this is what was causing the issue.  She is not currently consistently on anything was on prednisone a while back but was taken off of that secondary to all the risk and side effects of it.  She reported that she was tolerating it well though.    The patient does not have symptoms concerning for COVID-19 infection (fever, chills, cough, or new shortness of breath).   Past Medical, Surgical, Social History, Allergies, and Medications have been Reviewed.   Past Medical History:  Diagnosis Date   Anemia    Arthritis    Carpal tunnel syndrome, bilateral    Diverticulosis dx 2011   GERD (gastroesophageal reflux disease)    Headache    Hypertension 06/02/2018   Low back pain    Obesity    Rheumatoid arthritis(714.0)    Past Surgical History:  Procedure Laterality Date   bilateral foot surgery     COLONOSCOPY  2011   Dr. Oneida Alar: 50mm sessile polyp (tubular  adenoma), pancolonic diverticulosis   COLONOSCOPY N/A 09/26/2017   Procedure: COLONOSCOPY;  Surgeon: Danie Binder, MD;  [facility];  Service: Endoscopy;  Laterality: N/A;  7:30am   ESOPHAGOGASTRODUODENOSCOPY N/A 09/16/2017   Procedure: ESOPHAGOGASTRODUODENOSCOPY (EGD);  Surgeon: Danie Binder, MD;  [facility];  Service: Endoscopy;  Laterality: N/A;   FLEXIBLE SIGMOIDOSCOPY N/A 09/16/2017   Procedure: FLEXIBLE SIGMOIDOSCOPY;  Surgeon: Danie Binder, MD;  [facility]: [facility];   Service: Endoscopy;  Laterality: N/A;   HEMORRHOID SURGERY  1980   KNEE ARTHROSCOPY WITH LATERAL MENISECTOMY Right 10/16/2018   Procedure: KNEE ARTHROSCOPY WITH LATERAL MENISCECTOMY;  Surgeon: Carole Civil, MD;  Location: [facility];  Service: Orthopedics;  Laterality: Right;   OOPHORECTOMY Right    roophorectomy for benign disease  March 2011   Dr. Glo Herring   tonsillectomy and adenoidctomy in childhood     TUBAL LIGATION  1977     Current Meds  Medication Sig   acetaminophen (TYLENOL) 500 MG tablet One twice daily for arthritic pain (Patient taking differently: Take 1,000 mg by mouth every 4 (four) hours as needed for mild pain. One twice daily for arthritic pain)   alendronate (FOSAMAX) 70 MG tablet Take 1 tablet (70 mg total) by mouth every 7 (seven) days. Take with a full glass of water on an empty stomach.   amLODipine (NORVASC) 2.5 MG tablet Take 1 tablet (2.5 mg total) by mouth daily.   Calcium Carb-Cholecalciferol (CALCIUM 600 + D PO) Take 1 tablet by mouth every evening.    cyclobenzaprine (FLEXERIL) 5 MG tablet Take one tablet twice weekly as needed, for back spasm   folic acid (FOLVITE) Q000111Q MCG tablet Take 400 mcg by mouth every evening.    IRON-B12-VITAMINS PO Take 500 mcg by mouth every evening.    levothyroxine (SYNTHROID) 50 MCG tablet Take 1 tablet (50 mcg total) by mouth daily.   Multiple Vitamins-Iron (MULTIVITAMINS WITH IRON) TABS tablet Take 1 tablet by mouth every evening.   neomycin-polymyxin-hydrocortisone (CORTISPORIN) OTIC solution Apply 1 to 2 drops to toe BID after soaking   omeprazole (PRILOSEC) 20 MG capsule TAKE 1 CAPSULE BY MOUTH 30  MINUTES PRIOR TO BREAKFAST.     Allergies:   Patient has no known allergies.   Social History   Tobacco Use   Smoking status: Former Smoker    Packs/day: 2.00    Years: 25.00    Pack years: 50.00    Types: Cigarettes    Quit date: 05/12/2014    Years since quitting: 5.2   Smokeless tobacco: Never  Used  Substance Use Topics   Alcohol use: No   Drug use: No     Family Hx: The patient's family history includes Arthritis in her sister; Cancer (age of onset: 42) in her mother; Diabetes in her sister; Heart attack in her brother; Heart failure in her father, mother, and sister; Hypertension in her daughter, daughter, and sister; Leukemia in her brother; Thyroid disease in her daughter and daughter. There is no history of Colon cancer or Colon polyps.  ROS:   Please see the history of present illness.    All other systems reviewed and are negative.   Labs/Other Tests and Data Reviewed:    Recent Labs: 05/13/2019: TSH 3.51 08/04/2019: ALT 20; BUN 19; Creatinine, Ser 0.54; Hemoglobin 10.9; Platelets 229; Potassium 4.1; Sodium 137   Recent Lipid Panel Lab Results  Component Value Date/Time   CHOL 202 (H) 03/17/2019 08:21 AM  TRIG 85 03/17/2019 08:21 AM   HDL 72 03/17/2019 08:21 AM   CHOLHDL 2.8 03/17/2019 08:21 AM   LDLCALC 112 (H) 03/17/2019 08:21 AM    Wt Readings from Last 3 Encounters:  08/12/19 160 lb (72.6 kg)  08/04/19 156 lb (70.8 kg)  02/24/19 176 lb 1.9 oz (79.9 kg)     Objective:    Vital Signs:  BP 120/72    Ht 5\' 2"  (1.575 m)    Wt 160 lb (72.6 kg)    BMI 29.26 kg/m    VITAL SIGNS:  reviewed GEN:  Alert and oriented RESPIRATORY:  No shortness of breath noted in conversation via the telephone PSYCH:  Normal affect, mood  ASSESSMENT & PLAN:    1. Rheumatoid arthritis, involving unspecified site, unspecified rheumatoid factor presence (HCC) History of rheumatoid arthritis.  Had RA latex turbid that was elevated in the past.  Was on prednisone for a while is not on this anymore.  Could possibly benefit from methotrexate or some other modulating medication.  Would consider discussing this with Dr. Moshe Cipro if she continues to have what she thinks her flareups of the rheumatoid arthritis.  2. Weakness This is completely resolved.  She is feeling back to  herself.  Strongly educated her on the proper maintenance of RA and any form of weakness.  Making sure that she is staying hydrated, having mobility and stretching, but resting to help her self recover when she is active and mobile.  3. Anemia, unspecified type Was anemic at 10.8 lower than her normal baseline.  She denied any signs of bleeding.  Reviewed signs and symptoms to look for.  Encouraged to take a multivitamin with iron.  And get vitamin B that she reports and the folate that she normally takes.  Will possibly need a recheck of this in the near future.   Time:   Today, I have spent 10 minutes with the patient with telehealth technology discussing the above problems.     Medication Adjustments/Labs and Tests Ordered: Current medicines are reviewed at length with the patient today.  Concerns regarding medicines are outlined above.   Tests Ordered: No orders of the defined types were placed in this encounter.   Medication Changes: No orders of the defined types were placed in this encounter.   Disposition:  Follow up prn come back for a flu clinic prior to her next appointment which is in November.  Signed, Perlie Mayo, NP  08/12/2019 8:35 AM     [place]

## 2019-08-13 ENCOUNTER — Inpatient Hospital Stay: Payer: Medicare Other | Admitting: Family Medicine

## 2019-08-24 ENCOUNTER — Ambulatory Visit (INDEPENDENT_AMBULATORY_CARE_PROVIDER_SITE_OTHER): Payer: Medicare Other

## 2019-08-24 ENCOUNTER — Other Ambulatory Visit: Payer: Self-pay

## 2019-08-24 DIAGNOSIS — Z23 Encounter for immunization: Secondary | ICD-10-CM | POA: Diagnosis not present

## 2019-09-24 ENCOUNTER — Other Ambulatory Visit: Payer: Self-pay | Admitting: Family Medicine

## 2019-09-24 DIAGNOSIS — I10 Essential (primary) hypertension: Secondary | ICD-10-CM

## 2019-11-04 IMAGING — CT CT HEAD WITHOUT CONTRAST
3 series · 15 of 47 positions shown, 18 images · non-contrast
Comparison: None.

CLINICAL DATA: 73-year-old female with focal neurologic deficit and
headache.

EXAM:
CT HEAD WITHOUT CONTRAST
TECHNIQUE: Contiguous axial images were obtained from the base of the skull
through the vertex without intravenous contrast.

[Series 2: head w o · axial · 0.41mm/px · z∈[+447,+572]mm · 9 of 30 slices shown, 12 images]
[im 3/30  brain]
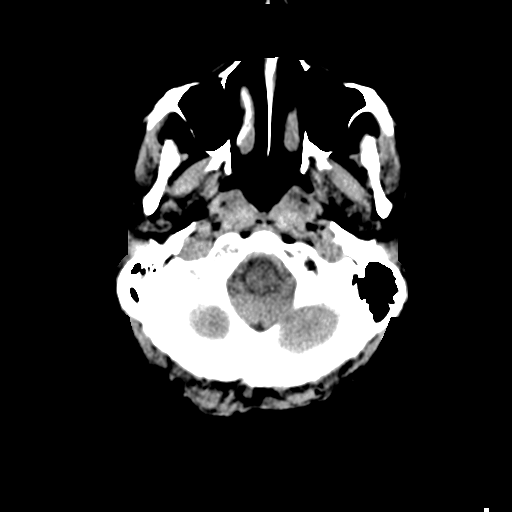
[im 3/30  bone]
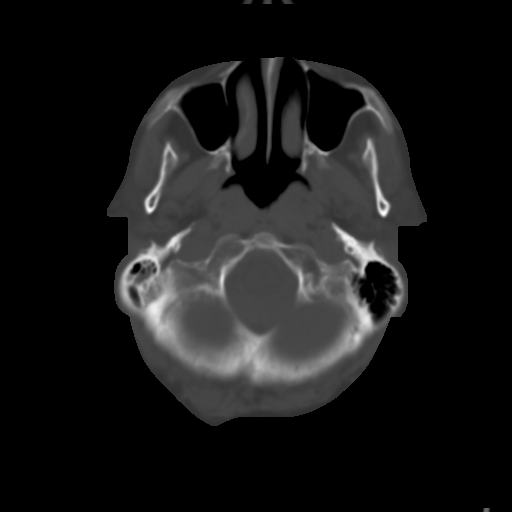
[im 6/30  brain]
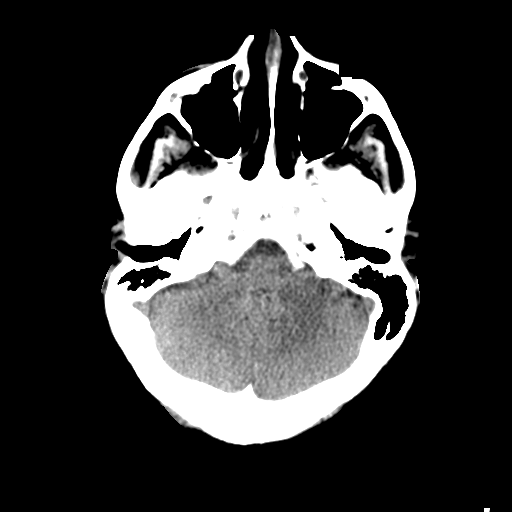
[im 9/30  brain]
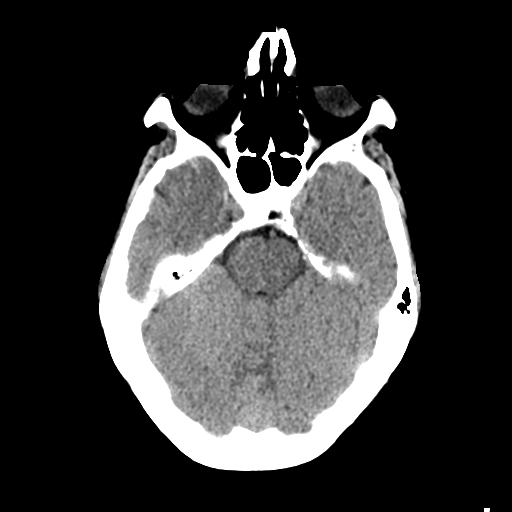
[im 12/30  brain]
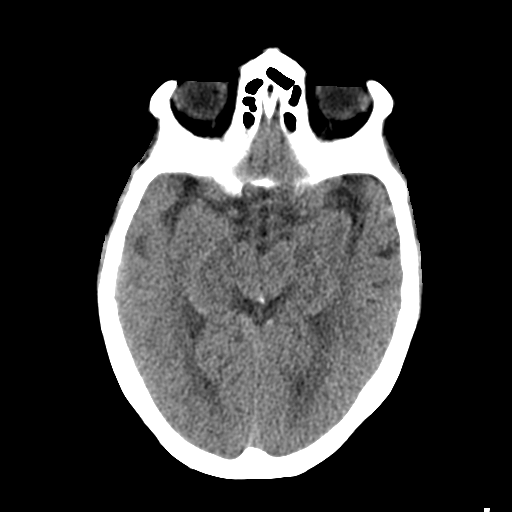
[im 16/30  brain]
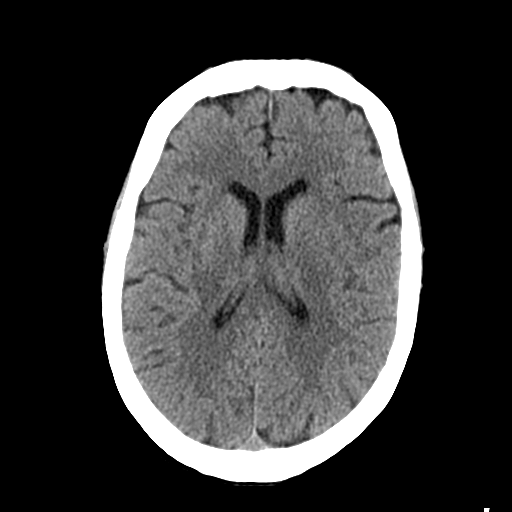
[im 16/30  bone]
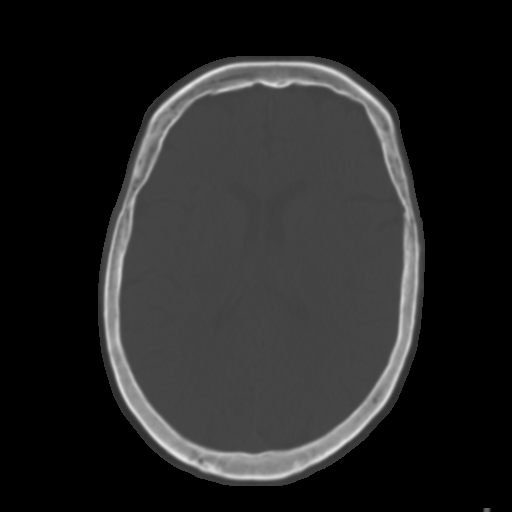
[im 19/30  brain]
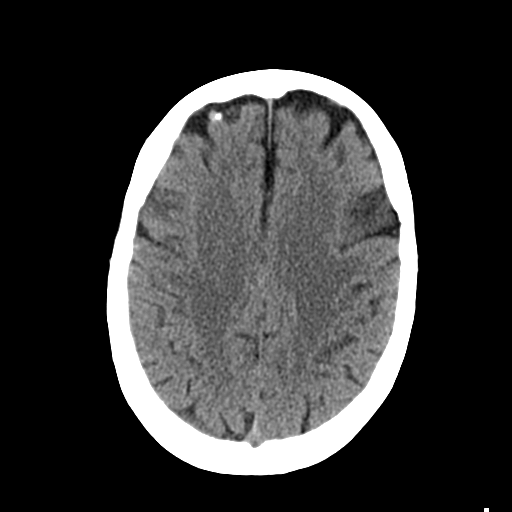
[im 22/30  brain]
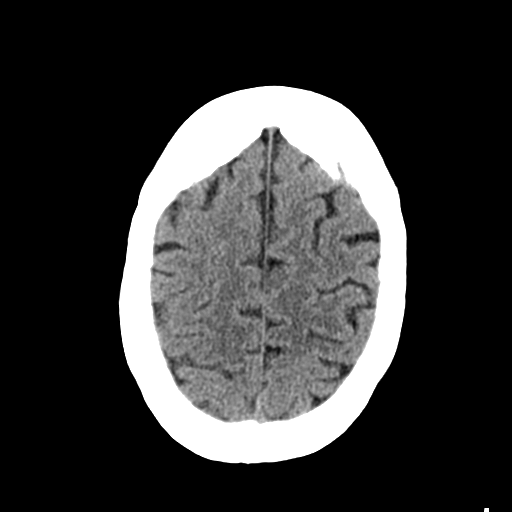
[im 25/30  brain]
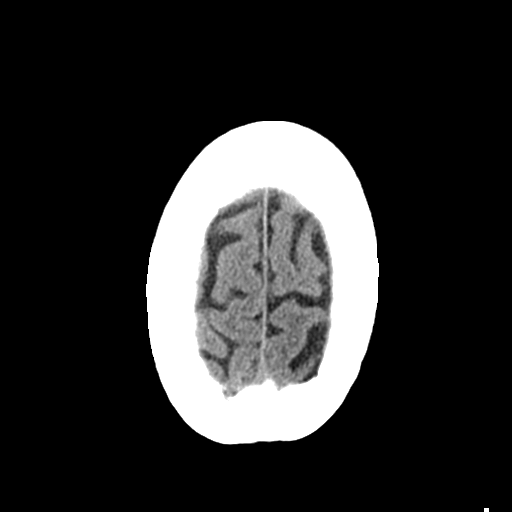
[im 28/30  brain]
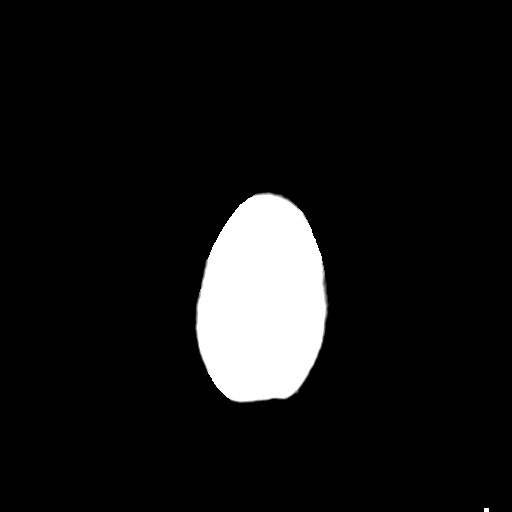
[im 28/30  bone]
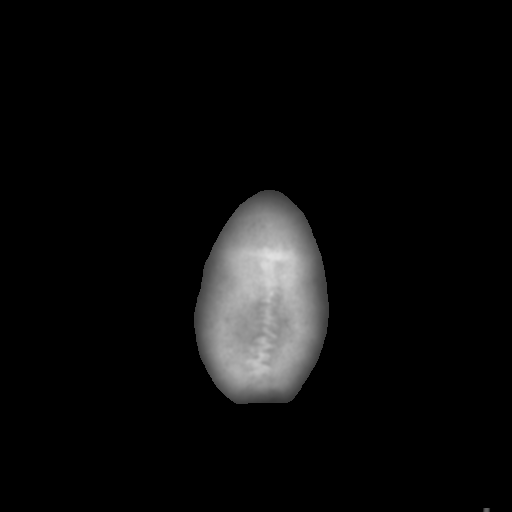

[Series 4: coronal soft · coronal · 0.30mm/px · 3 of 70 slices shown]
[im 24/70  brain]
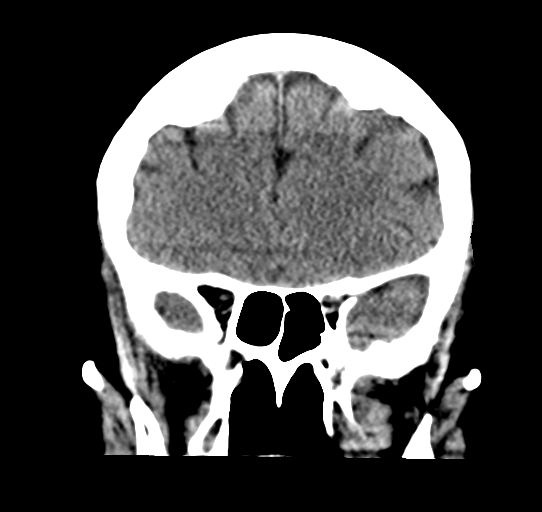
[im 31/70  brain]
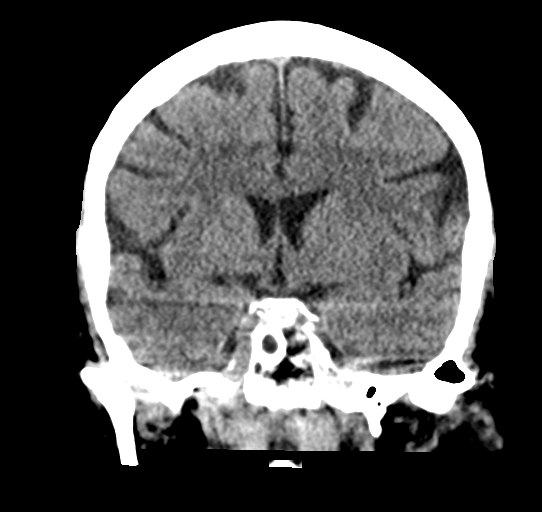
[im 39/70  brain]
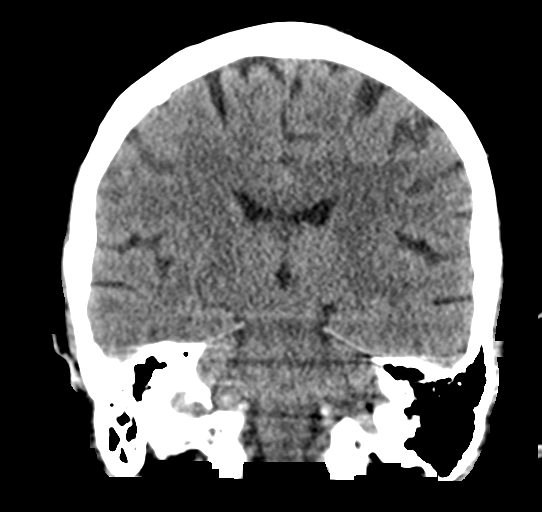

[Series 5: sagittal soft · sagittal · 0.34mm/px · 3 of 67 slices shown]
[im 23/67  brain]
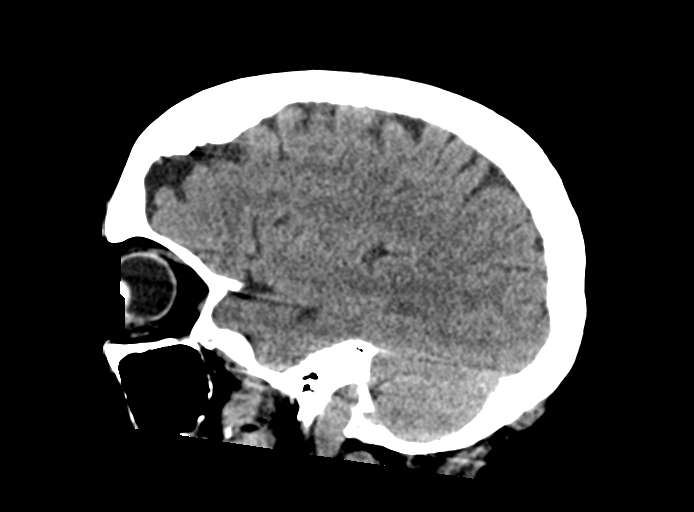
[im 34/67  brain]
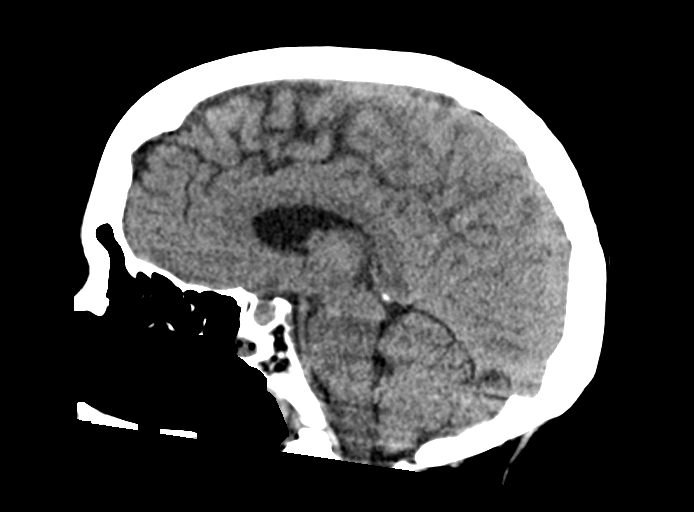
[im 45/67  brain]
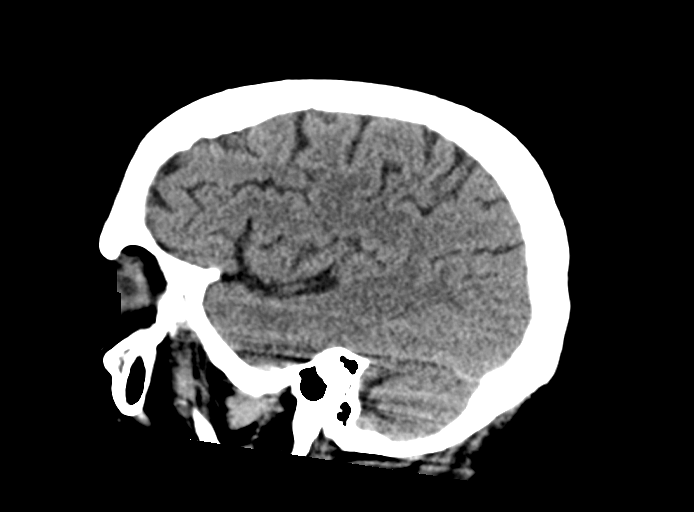

[15 of 47 positions shown; findings below may reference images not displayed]

FINDINGS: Brain: Mild age-related atrophy and chronic microvascular ischemic
changes. There is no acute intracranial hemorrhage. No mass effect
or midline shift. No extra-axial fluid collection.

Vascular: No hyperdense vessel or unexpected calcification.

Skull: Normal. Negative for fracture or focal lesion.

Sinuses/Orbits: Partial opacification the sphenoid sinuses. The
remainder of the visualized paranasal sinuses and left mastoid air
cells are clear. Mild right mastoid effusion.

Other: None
IMPRESSION: 1. No acute intracranial hemorrhage.
2. Mild age-related atrophy and chronic microvascular ischemic
changes.

## 2019-11-09 ENCOUNTER — Ambulatory Visit (INDEPENDENT_AMBULATORY_CARE_PROVIDER_SITE_OTHER): Payer: Medicare Other | Admitting: Family Medicine

## 2019-11-09 ENCOUNTER — Encounter: Payer: Self-pay | Admitting: Family Medicine

## 2019-11-09 ENCOUNTER — Other Ambulatory Visit: Payer: Self-pay

## 2019-11-09 VITALS — BP 150/68 | HR 71 | Temp 98.2°F | Ht 62.0 in | Wt 169.0 lb

## 2019-11-09 DIAGNOSIS — E559 Vitamin D deficiency, unspecified: Secondary | ICD-10-CM

## 2019-11-09 DIAGNOSIS — E785 Hyperlipidemia, unspecified: Secondary | ICD-10-CM

## 2019-11-09 DIAGNOSIS — M069 Rheumatoid arthritis, unspecified: Secondary | ICD-10-CM

## 2019-11-09 DIAGNOSIS — Z0001 Encounter for general adult medical examination with abnormal findings: Secondary | ICD-10-CM | POA: Diagnosis not present

## 2019-11-09 DIAGNOSIS — M5410 Radiculopathy, site unspecified: Secondary | ICD-10-CM

## 2019-11-09 DIAGNOSIS — Z0000 Encounter for general adult medical examination without abnormal findings: Secondary | ICD-10-CM

## 2019-11-09 DIAGNOSIS — M549 Dorsalgia, unspecified: Secondary | ICD-10-CM | POA: Diagnosis not present

## 2019-11-09 DIAGNOSIS — I10 Essential (primary) hypertension: Secondary | ICD-10-CM | POA: Diagnosis not present

## 2019-11-09 DIAGNOSIS — E039 Hypothyroidism, unspecified: Secondary | ICD-10-CM

## 2019-11-09 MED ORDER — METHYLPREDNISOLONE ACETATE 80 MG/ML IJ SUSP
80.0000 mg | Freq: Once | INTRAMUSCULAR | Status: AC
  Administered 2019-11-09: 80 mg via INTRAMUSCULAR

## 2019-11-09 MED ORDER — ERGOCALCIFEROL 1.25 MG (50000 UT) PO CAPS: 50000.0000 [IU] | ORAL_CAPSULE | ORAL | 1 refills | Status: DC

## 2019-11-09 MED ORDER — FAMOTIDINE 40 MG PO TABS: 40.0000 mg | ORAL_TABLET | Freq: Every day | ORAL | 0 refills | Status: DC

## 2019-11-09 MED ORDER — MONTELUKAST SODIUM 10 MG PO TABS: 10.0000 mg | ORAL_TABLET | Freq: Every day | ORAL | 1 refills | Status: DC

## 2019-11-09 MED ORDER — OMEPRAZOLE 20 MG PO CPDR: DELAYED_RELEASE_CAPSULE | ORAL | 1 refills | Status: DC

## 2019-11-09 MED ORDER — AMLODIPINE BESYLATE 2.5 MG PO TABS: 2.5000 mg | ORAL_TABLET | Freq: Every day | ORAL | 1 refills | Status: DC

## 2019-11-09 MED ORDER — KETOROLAC TROMETHAMINE 60 MG/2ML IM SOLN
60.0000 mg | Freq: Once | INTRAMUSCULAR | Status: AC
  Administered 2019-11-09: 10:00:00 60 mg via INTRAMUSCULAR

## 2019-11-09 MED ORDER — AMLODIPINE BESYLATE 5 MG PO TABS: 5.0000 mg | ORAL_TABLET | Freq: Every day | ORAL | 2 refills | Status: DC

## 2019-11-09 MED ORDER — IBUPROFEN 600 MG PO TABS: ORAL_TABLET | ORAL | 0 refills | Status: AC

## 2019-11-09 MED ORDER — PREDNISONE 5 MG (21) PO TBPK: 5.0000 mg | ORAL_TABLET | ORAL | 0 refills | Status: DC

## 2019-11-09 NOTE — Progress Notes (Signed)
    Bianca Mcguire     MRN: [id number]      DOB: [birth year]  HPI: Patient is in for annual physical exam. 3 week h/o right sciatic pain to ankle rated at 8 Fell 1 week ago bruising of left breast and hurt right knee Chronic cough , uncontrolled allergies  Heartburn PE:  BP (!) 150/68   Pulse 71   Temp 98.2 F (36.8 C) (Temporal)   Ht 5\' 2"  (1.575 m)   Wt 169 lb (76.7 kg)   SpO2 93%   BMI 30.91 kg/m  Pleasant  female, alert and oriented x 3, in no cardio-pulmonary distress. Afebrile. HEENT No facial trauma or asymetry. Sinuses non tender. Extra occullar muscles intact.. External ears normal, . Neck: supple, no adenopathy,JVD or thyromegaly.No bruits.  Chest: Clear to ascultation bilaterally.No crackles or wheezes. Non tender to palpation  Breast: Deferred  Per pt request, will get mammogram, denies mass or pain  Cardiovascular system; Heart sounds normal,  S1 and  S2 ,no S3.  No murmur, or thrill. Apical beat not displaced Peripheral pulses normal.  Abdomen: Soft, non tender, no organomegaly or masses. No bruits. Bowel sounds normal. No guarding, tenderness or rebound.    Musculoskeletal exam: Decreased  ROM of spine, hips , shoulders and knees. Marked  deformity and swelling or joints ofall digits of both hands No muscle wasting or atrophy.   Neurologic: Cranial nerves 2 to 12 intact. , tone ,sensation and reflexes normal throughout.grade 3 grip in both hands with thenar wasting  disturbance in gait. No tremor.  Skin: Intact, no ulceration, erythema , scaling or rash noted.Bruising of left breast upper outer quadrant Pigmentation normal throughout  Psych; Normal mood and affect. Judgement and concentration normal   Assessment & Plan:  Annual physical exam Annual exam as documented.  Immunization and cancer screening needs are specifically addressed at this visit.   Essential hypertension Uncontrolled , increase amlodipine to 5 mg daily DASH  diet and commitment to daily physical activity for a minimum of 30 minutes discussed and encouraged, as a part of hypertension management. The importance of attaining a healthy weight is also discussed.  BP/Weight 11/09/2019 08/12/2019 08/04/2019 02/24/2019 10/24/2018 10/16/2018 0000000  Systolic BP Q000111Q 123456 [id number] Q000111Q [id number]  Diastolic BP 68 72 45 76 87 69 80  Wt. (Lbs) 169 160 156 176.12 171 161 162  BMI 30.91 29.26 28.53 32.21 31.28 29.45 29.63       Rheumatoid arthritis (HCC) Severe with progressive debility from pain and stiffness, refer Rheumatology  Back pain with right-sided radiculopathy Uncontrolled.Toradol and depo medrol administered IM in the office , to be followed by a short course of oral prednisone and NSAIDS.

## 2019-11-09 NOTE — Patient Instructions (Addendum)
Follow-up in office to reevaluate blood pressure with MD in 2 months call if you need me sooner.  Mammogram to be scheduled early in January Monday morning preferred.  Toradol 60 mg IM and Depo-Medrol 80 mg IM in office today for acute right sciatic pain.  Short course of ibuprofen and prednisone are also prescribed.  Heartburn medicine is also prescribed.  For a short time.  New for allergies is singulair,  1 daily this is sent to mail order.  INCREASE amlodipine to 5 mg once daily, so please take TWO 2.5 mg tablets together daily until your new dose arrives  Fasting lipid, cmp and EGFR, TSH, , CBC, HBA1C, TSH and vit D first week in January , also h pylori breath test   Careful not to fall  You will be referred to Rheumatology re arthritis  Best for 2020/2021!  Thanks for choosing [facility], we consider it a privelige to serve you.

## 2019-11-14 NOTE — Assessment & Plan Note (Signed)
Annual exam as documented. . Immunization and cancer screening needs are specifically addressed at this visit.  

## 2019-11-14 NOTE — Assessment & Plan Note (Signed)
Uncontrolled , increase amlodipine to 5 mg daily DASH diet and commitment to daily physical activity for a minimum of 30 minutes discussed and encouraged, as a part of hypertension management. The importance of attaining a healthy weight is also discussed.  BP/Weight 11/09/2019 08/12/2019 08/04/2019 02/24/2019 10/24/2018 10/16/2018 0000000  Systolic BP Q000111Q 123456 123456 [id number] Q000111Q [id number]  Diastolic BP 68 72 45 76 87 69 80  Wt. (Lbs) 169 160 156 176.12 171 161 162  BMI 30.91 29.26 28.53 32.21 31.28 29.45 29.63

## 2019-11-14 NOTE — Assessment & Plan Note (Signed)
Severe with progressive debility from pain and stiffness, refer Rheumatology

## 2019-11-14 NOTE — Assessment & Plan Note (Signed)
Uncontrolled.Toradol and depo medrol administered IM in the office , to be followed by a short course of oral prednisone and NSAIDS.  

## 2019-11-19 ENCOUNTER — Ambulatory Visit (HOSPITAL_COMMUNITY)
Admission: RE | Admit: 2019-11-19 | Discharge: 2019-11-19 | Disposition: A | Discharge status: Home / Self Care | Payer: Medicare Other | Source: Ambulatory Visit | Attending: Family Medicine | Admitting: Family Medicine

## 2019-11-19 ENCOUNTER — Other Ambulatory Visit: Payer: Self-pay

## 2019-11-19 DIAGNOSIS — Z1231 Encounter for screening mammogram for malignant neoplasm of breast: Secondary | ICD-10-CM | POA: Insufficient documentation

## 2019-12-31 DIAGNOSIS — I10 Essential (primary) hypertension: Secondary | ICD-10-CM | POA: Diagnosis not present

## 2019-12-31 DIAGNOSIS — E039 Hypothyroidism, unspecified: Secondary | ICD-10-CM | POA: Diagnosis not present

## 2019-12-31 DIAGNOSIS — E785 Hyperlipidemia, unspecified: Secondary | ICD-10-CM | POA: Diagnosis not present

## 2019-12-31 DIAGNOSIS — E559 Vitamin D deficiency, unspecified: Secondary | ICD-10-CM | POA: Diagnosis not present

## 2020-01-01 LAB — LIPID PANEL
Cholesterol: 199 mg/dL (ref <200)
HDL: 67 mg/dL (ref >=50)
LDL Cholesterol (Calc): 113 mg/dL (calc) — ABNORMAL HIGH
Non-HDL Cholesterol (Calc): 132 mg/dL (calc) — ABNORMAL HIGH (ref <130)
Total CHOL/HDL Ratio: 3 (calc) (ref <5.0)
Triglycerides: 86 mg/dL (ref <150)

## 2020-01-01 LAB — COMPLETE METABOLIC PANEL WITH GFR
AG Ratio: 1.6 (calc) (ref 1.0–2.5)
ALT: 9 U/L (ref 6–29)
AST: 17 U/L (ref 10–35)
Albumin: 4.1 g/dL (ref 3.6–5.1)
Alkaline phosphatase (APISO): 86 U/L (ref 37–153)
BUN: 23 mg/dL (ref 7–25)
CO2: 27 mmol/L (ref 20–32)
Calcium: 9.5 mg/dL (ref 8.6–10.4)
Chloride: 107 mmol/L (ref 98–110)
Creat: 0.69 mg/dL (ref 0.60–0.93)
GFR, Est African American: 100 mL/min/{1.73_m2} (ref >=60)
GFR, Est Non African American: 86 mL/min/{1.73_m2} (ref >=60)
Globulin: 2.5 g/dL (calc) (ref 1.9–3.7)
Glucose, Bld: 106 mg/dL — ABNORMAL HIGH (ref 65–99)
Potassium: 4 mmol/L (ref 3.5–5.3)
Sodium: 143 mmol/L (ref 135–146)
Total Bilirubin: 0.3 mg/dL (ref 0.2–1.2)
Total Protein: 6.6 g/dL (ref 6.1–8.1)

## 2020-01-01 LAB — CBC
HCT: 37.2 % (ref 35.0–45.0)
Hemoglobin: 12.2 g/dL (ref 11.7–15.5)
MCH: 28.2 pg (ref 27.0–33.0)
MCHC: 32.8 g/dL (ref 32.0–36.0)
MCV: 85.9 fL (ref 80.0–100.0)
MPV: 9.5 fL (ref 7.5–12.5)
Platelets: 207 10*3/uL (ref 140–400)
RBC: 4.33 10*6/uL (ref 3.80–5.10)
RDW: 15 % (ref 11.0–15.0)
WBC: 5.5 10*3/uL (ref 3.8–10.8)

## 2020-01-01 LAB — VITAMIN D 25 HYDROXY (VIT D DEFICIENCY, FRACTURES): Vit D, 25-Hydroxy: 24 ng/mL — ABNORMAL LOW (ref 30–100)

## 2020-01-01 LAB — TSH: TSH: 6.69 mIU/L — ABNORMAL HIGH (ref 0.40–4.50)

## 2020-01-06 ENCOUNTER — Ambulatory Visit: Payer: Medicare Other | Admitting: Family Medicine

## 2020-01-07 ENCOUNTER — Other Ambulatory Visit: Payer: Self-pay | Admitting: Family Medicine

## 2020-01-11 ENCOUNTER — Other Ambulatory Visit: Payer: Self-pay | Admitting: Family Medicine

## 2020-01-12 ENCOUNTER — Ambulatory Visit (INDEPENDENT_AMBULATORY_CARE_PROVIDER_SITE_OTHER): Payer: Medicare Other | Admitting: Family Medicine

## 2020-01-12 ENCOUNTER — Other Ambulatory Visit: Payer: Self-pay

## 2020-01-12 VITALS — BP 132/68 | HR 92 | Temp 98.3°F | Ht 62.0 in | Wt 167.8 lb

## 2020-01-12 DIAGNOSIS — M25561 Pain in right knee: Secondary | ICD-10-CM

## 2020-01-12 DIAGNOSIS — E785 Hyperlipidemia, unspecified: Secondary | ICD-10-CM | POA: Diagnosis not present

## 2020-01-12 DIAGNOSIS — I10 Essential (primary) hypertension: Secondary | ICD-10-CM

## 2020-01-12 DIAGNOSIS — E039 Hypothyroidism, unspecified: Secondary | ICD-10-CM | POA: Diagnosis not present

## 2020-01-12 DIAGNOSIS — M545 Low back pain, unspecified: Secondary | ICD-10-CM

## 2020-01-12 DIAGNOSIS — R2681 Unsteadiness on feet: Secondary | ICD-10-CM

## 2020-01-12 DIAGNOSIS — E663 Overweight: Secondary | ICD-10-CM

## 2020-01-12 MED ORDER — IBUPROFEN 600 MG PO TABS: ORAL_TABLET | ORAL | 3 refills | Status: DC

## 2020-01-12 MED ORDER — KETOROLAC TROMETHAMINE 60 MG/2ML IM SOLN
60.0000 mg | Freq: Once | INTRAMUSCULAR | Status: AC
  Administered 2020-01-12: 11:00:00 60 mg via INTRAMUSCULAR

## 2020-01-12 MED ORDER — LEVOTHYROXINE SODIUM 50 MCG PO TABS: ORAL_TABLET | ORAL | 3 refills | Status: DC

## 2020-01-12 MED ORDER — PREDNISONE 10 MG PO TABS: 10.0000 mg | ORAL_TABLET | Freq: Two times a day (BID) | ORAL | 0 refills | Status: DC

## 2020-01-12 MED ORDER — METHYLPREDNISOLONE ACETATE 80 MG/ML IJ SUSP
80.0000 mg | Freq: Once | INTRAMUSCULAR | Status: AC
  Administered 2020-01-12: 11:00:00 80 mg via INTRAMUSCULAR

## 2020-01-12 NOTE — Assessment & Plan Note (Signed)
Radiates down right leg Uncontrolled.Toradol and depo medrol administered IM in the office , to be followed by a short course of oral prednisone and NSAIDS.

## 2020-01-12 NOTE — Assessment & Plan Note (Signed)
Uncontrolled.Toradol and depo medrol administered IM in the office , to be followed by a short course of oral prednisone and NSAIDS.  

## 2020-01-12 NOTE — Patient Instructions (Addendum)
Follow-up in office in 4 months call if you need me sooner.  Toradol 60 mg  and Depo-Medrol 80 mg  administered in the office today for acute right knee and right lower extremity pain.  Short course of ibuprofen and prednisone are prescribed and are at your local pharmacy.  Please be careful not to fall and use cane to help to reduce fall risk and increased ability.  Thyroid medication dose is increased to 1-1/2 tablets on Friday Saturday and Sunday continue 1 tablet once daily on Monday through Thursday.   Recent labs are excellent.  You just need to reduce sugar starchy foods and fat fried foods to improve your overall health.  Your kidney function liver function and blood count are all normal.  Your vitamin D is slightly low please start taking over-the-counter vitamin D 5000 units once daily.  Please get TSH , chem 7 and eGFR ng 1 week before your next visit thank you  Thanks for choosing [facility], we consider it a privelige to serve you.

## 2020-01-12 NOTE — Assessment & Plan Note (Signed)
undercorected , dose increase and review

## 2020-01-18 ENCOUNTER — Encounter: Payer: Self-pay | Admitting: Family Medicine

## 2020-01-18 NOTE — Progress Notes (Signed)
   Bianca Mcguire     MRN: [id number]      DOB: August 17, 1946   HPI Ms. Askelson is here for follow up and re-evaluation of chronic medical conditions, medication management and review of any available recent lab and radiology data.  Preventive health is updated, specifically  Cancer screening and Immunization.   Questions or concerns regarding consultations or procedures which the PT has had in the interim are  addressed. The PT denies any adverse reactions to current medications since the last visit.  Increased right knee  And leg pain from back, denies loss of sensation or incontinence  ROS Denies recent fever or chills. Denies sinus pressure, nasal congestion, ear pain or sore throat. Denies chest congestion, productive cough or wheezing. Denies chest pains, palpitations and leg swelling Denies abdominal pain, nausea, vomiting,diarrhea or constipation.   Denies dysuria, frequency, hesitancy or incontinence.  Denies headaches, seizures, numbness, or tingling. Denies depression, anxiety or insomnia. Denies skin break down or rash.   PE  BP 132/68   Pulse 92   Temp 98.3 F (36.8 C) (Temporal)   Ht 5\' 2"  (1.575 m)   Wt 167 lb 12.8 oz (76.1 kg)   SpO2 92%   BMI 30.69 kg/m   Patient alert and oriented and in no cardiopulmonary distress.  HEENT: No facial asymmetry, EOMI,     Neck supple .  Chest: Clear to auscultation bilaterally.  CVS: S1, S2 no murmurs, no S3.Regular rate.  ABD: Soft non tender.   Ext: No edema  MS: decreased  ROM lumbar spine,  and right knee.  Skin: Intact, no ulcerations or rash noted.  Psych: Good eye contact, normal affect. Memory intact not anxious or depressed appearing.  CNS: CN 2-12 intact, power,  normal throughout.no focal deficits noted.   Assessment & Plan  Acute pain of right knee Uncontrolled.Toradol and depo medrol administered IM in the office , to be followed by a short course of oral prednisone and NSAIDS.   Low back pain  with radiation Radiates down right leg Uncontrolled.Toradol and depo medrol administered IM in the office , to be followed by a short course of oral prednisone and NSAIDS.   Adult hypothyroidism undercorected , dose increase and review  Hyperlipidemia Hyperlipidemia:Low fat diet discussed and encouraged.   Lipid Panel  Lab Results  Component Value Date   CHOL 199 12/31/2019   HDL 67 12/31/2019   LDLCALC 113 (H) 12/31/2019   TRIG 86 12/31/2019   CHOLHDL 3.0 12/31/2019       Overweight (BMI 25.0-29.9)  Patient re-educated about  the importance of commitment to a  minimum of 150 minutes of exercise per week as able.  The importance of healthy food choices with portion control discussed, as well as eating regularly and within a 12 hour window most days. The need to choose "clean , green" food 50 to 75% of the time is discussed, as well as to make water the primary drink and set a goal of 64 ounces water daily.    Weight /BMI 01/12/2020 11/09/2019 08/12/2019  WEIGHT 167 lb 12.8 oz 169 lb 160 lb  HEIGHT 5\' 2"  5\' 2"  5\' 2"   BMI 30.69 kg/m2 30.91 kg/m2 29.26 kg/m2      Unsteady gait Home safety discussed

## 2020-01-20 NOTE — Assessment & Plan Note (Signed)
Home safety discussed 

## 2020-01-20 NOTE — Assessment & Plan Note (Signed)
Hyperlipidemia:Low fat diet discussed and encouraged.   Lipid Panel  Lab Results  Component Value Date   CHOL 199 12/31/2019   HDL 67 12/31/2019   LDLCALC 113 (H) 12/31/2019   TRIG 86 12/31/2019   CHOLHDL 3.0 12/31/2019

## 2020-01-20 NOTE — Assessment & Plan Note (Signed)
  Patient re-educated about  the importance of commitment to a  minimum of 150 minutes of exercise per week as able.  The importance of healthy food choices with portion control discussed, as well as eating regularly and within a 12 hour window most days. The need to choose "clean , green" food 50 to 75% of the time is discussed, as well as to make water the primary drink and set a goal of 64 ounces water daily.    Weight /BMI 01/12/2020 11/09/2019 08/12/2019  WEIGHT 167 lb 12.8 oz 169 lb 160 lb  HEIGHT 5\' 2"  5\' 2"  5\' 2"   BMI 30.69 kg/m2 30.91 kg/m2 29.26 kg/m2

## 2020-01-27 ENCOUNTER — Ambulatory Visit: Payer: Medicare Other

## 2020-01-27 ENCOUNTER — Encounter: Payer: Self-pay | Admitting: Orthopedic Surgery

## 2020-01-27 ENCOUNTER — Ambulatory Visit: Payer: Medicare Other | Admitting: Orthopedic Surgery

## 2020-01-27 ENCOUNTER — Other Ambulatory Visit: Payer: Self-pay

## 2020-01-27 VITALS — BP 137/79 | HR 78 | Temp 97.3°F | Ht 62.0 in | Wt 164.0 lb

## 2020-01-27 DIAGNOSIS — G8929 Other chronic pain: Secondary | ICD-10-CM

## 2020-01-27 DIAGNOSIS — M25561 Pain in right knee: Secondary | ICD-10-CM | POA: Diagnosis not present

## 2020-01-27 DIAGNOSIS — M5441 Lumbago with sciatica, right side: Secondary | ICD-10-CM | POA: Diagnosis not present

## 2020-01-27 MED ORDER — GABAPENTIN 100 MG PO CAPS: 100.0000 mg | ORAL_CAPSULE | Freq: Three times a day (TID) | ORAL | 2 refills | Status: DC

## 2020-01-27 MED ORDER — IBUPROFEN 800 MG PO TABS: 800.0000 mg | ORAL_TABLET | Freq: Three times a day (TID) | ORAL | 1 refills | Status: DC | PRN

## 2020-01-27 NOTE — Progress Notes (Signed)
Bianca Mcguire  01/27/2020  Body mass index is 30 kg/m.   HISTORY SECTION :  Chief Complaint  Patient presents with  . Knee Pain    Right knee pain.   HPI The patient presents for evaluation of  (mild/moderate/severe/ ) severe radiating pain, in the (right /left) right hip and lower back to right knee occasionally lower leg, for 4 to 6 weeks, associated with no weakness no numbness or tingling.  Prior treatment ibuprofen and prednisone and injection including knee and hip injection from primary care   Review of Systems  Cardiovascular: Positive for leg swelling.  Musculoskeletal: Positive for joint pain and myalgias.  All other systems reviewed and are negative.    has a past medical history of Anemia, Arthritis, Carpal tunnel syndrome, bilateral, Diverticulosis (dx 2011), GERD (gastroesophageal reflux disease), Headache, Hypertension (06/02/2018), Low back pain, Obesity, and Rheumatoid arthritis(714.0).   Past Surgical History:  Procedure Laterality Date  . bilateral foot surgery    . COLONOSCOPY  2011   Dr. Oneida Alar: 62mm sessile polyp (tubular adenoma), pancolonic diverticulosis  . COLONOSCOPY N/A 09/26/2017   Procedure: COLONOSCOPY;  Surgeon: Danie Binder, MD;  Location: [facility];  Service: Endoscopy;  Laterality: N/A;  7:30am  . ESOPHAGOGASTRODUODENOSCOPY N/A 09/16/2017   Procedure: ESOPHAGOGASTRODUODENOSCOPY (EGD);  Surgeon: Danie Binder, MD;  Location: [facility];  Service: Endoscopy;  Laterality: N/A;  . FLEXIBLE SIGMOIDOSCOPY N/A 09/16/2017   Procedure: FLEXIBLE SIGMOIDOSCOPY;  Surgeon: Danie Binder, MD;  Location: [facility];  Service: Endoscopy;  Laterality: N/A;  . [place]  . KNEE ARTHROSCOPY WITH LATERAL MENISECTOMY Right 10/16/2018   Procedure: KNEE ARTHROSCOPY WITH LATERAL MENISCECTOMY;  Surgeon: Carole Civil, MD;  Location: [facility];  Service: Orthopedics;  Laterality: Right;  . OOPHORECTOMY Right   . roophorectomy for  benign disease  March 2011   Dr. Glo Herring  . tonsillectomy and adenoidctomy in childhood    . TUBAL LIGATION  1977    Body mass index is 30 kg/m.   No Known Allergies   Current Outpatient Medications:  .  acetaminophen (TYLENOL) 500 MG tablet, One twice daily for arthritic pain (Patient taking differently: Take 1,000 mg by mouth every 4 (four) hours as needed for mild pain. One twice daily for arthritic pain), Disp: 60 tablet, Rfl: 2 .  alendronate (FOSAMAX) 70 MG tablet, TAKE 1 TABLET BY MOUTH  EVERY 7 DAYS WITH A FULL  GLASS OF WATER ON AN EMPTY  STOMACH, Disp: 12 tablet, Rfl: 0 .  amLODipine (NORVASC) 5 MG tablet, Take 1 tablet (5 mg total) by mouth daily., Disp: 90 tablet, Rfl: 2 .  Calcium Carb-Cholecalciferol (CALCIUM 600 + D PO), Take 1 tablet by mouth every evening. , Disp: , Rfl:  .  cyclobenzaprine (FLEXERIL) 5 MG tablet, Take one tablet twice weekly as needed, for back spasm, Disp: 30 tablet, Rfl: 1 .  ergocalciferol (VITAMIN D2) 1.25 MG (50000 UT) capsule, Take 1 capsule (50,000 Units total) by mouth once a week. One capsule once weekly, Disp: 12 capsule, Rfl: 1 .  famotidine (PEPCID) 40 MG tablet, Take 1 tablet (40 mg total) by mouth daily., Disp: 14 tablet, Rfl: 0 .  folic acid (FOLVITE) Q000111Q MCG tablet, Take 400 mcg by mouth every evening. , Disp: , Rfl:  .  IRON-B12-VITAMINS PO, Take 500 mcg by mouth every evening. , Disp: , Rfl:  .  levothyroxine (SYNTHROID) 50 MCG tablet, Take one tablet once daily from Monday through  Thursday.  Then take one and a half tablets once daily on Friday Saturday and Sunday, Disp: 105 tablet, Rfl: 3 .  montelukast (SINGULAIR) 10 MG tablet, Take 1 tablet (10 mg total) by mouth at bedtime., Disp: 90 tablet, Rfl: 1 .  Multiple Vitamins-Iron (MULTIVITAMINS WITH IRON) TABS tablet, Take 1 tablet by mouth every evening., Disp: , Rfl:  .  neomycin-polymyxin-hydrocortisone (CORTISPORIN) OTIC solution, Apply 1 to 2 drops to toe BID after soaking, Disp: 10  mL, Rfl: 0 .  omeprazole (PRILOSEC) 20 MG capsule, TAKE 1 CAPSULE BY MOUTH 1/2 HOUR PRIOR TO BREAKFAST, Disp: 90 capsule, Rfl: 1 .  predniSONE (DELTASONE) 10 MG tablet, Take 1 tablet (10 mg total) by mouth 2 (two) times daily with a meal., Disp: 10 tablet, Rfl: 0 .  gabapentin (NEURONTIN) 100 MG capsule, Take 1 capsule (100 mg total) by mouth 3 (three) times daily., Disp: 90 capsule, Rfl: 2 .  ibuprofen (ADVIL) 800 MG tablet, Take 1 tablet (800 mg total) by mouth every 8 (eight) hours as needed., Disp: 90 tablet, Rfl: 1   PHYSICAL EXAM SECTION: 1) BP 137/79   Pulse 78   Temp (!) 97.3 F (36.3 C)   Ht 5\' 2"  (1.575 m)   Wt 164 lb (74.4 kg)   BMI 30.00 kg/m   Body mass index is 30 kg/m. General appearance: Well-developed well-nourished no gross deformities  2) Cardiovascular normal pulse and perfusion , normal color   3) Neurologically deep tendon reflexes are equal and normal, no sensation loss or deficits no pathologic reflexes  4) Psychological: Awake alert and oriented x3 mood and affect normal  5) Skin no lacerations or ulcerations no nodularity no palpable masses, no erythema or nodularity  6) Musculoskeletal:   Tenderness over the medial aspects of both knees consistent with pes bursitis  Lateral joint line tenderness right knee painful range of motion minimal tenderness lateral compartment ligaments stable.  Muscle tone normal.  Skin intact  Lumbar spine tenderness normal hip motion  Tenderness in the right lower back midline central lower back  MEDICAL DECISION MAKING  A.  Encounter Diagnoses  Name Primary?  . Acute right-sided low back pain with right-sided sciatica Yes  . Chronic pain of right knee     B. DATA ANALYSED:  IMAGING:  Images in-house include spine showing degenerative changes disc disease scoliosis mild spondylolisthesis and moderate spondylosis  Valgus osteoarthritis right knee with mild malalignment  Outside records reviewed: no  C.  MANAGEMENT  Inject right knee  Procedure note right knee injection   verbal consent was obtained to inject right knee joint  Timeout was completed to confirm the site of injection  The medications used were 40 mg of Depo-Medrol and 1% lidocaine 3 cc  Anesthesia was provided by ethyl chloride and the skin was prepped with alcohol.  After cleaning the skin with alcohol a 20-gauge needle was used to inject the right knee joint. There were no complications. A sterile bandage was applied.   Start medication for sciatica radiculopathy and lumbar disc disease  Meds ordered this encounter  Medications  . gabapentin (NEURONTIN) 100 MG capsule    Sig: Take 1 capsule (100 mg total) by mouth 3 (three) times daily.    Dispense:  90 capsule    Refill:  2  . ibuprofen (ADVIL) 800 MG tablet    Sig: Take 1 tablet (800 mg total) by mouth every 8 (eight) hours as needed.    Dispense:  90 tablet  Refill:  1      Arther Abbott, MD  01/27/2020 4:00 PM

## 2020-01-27 NOTE — Patient Instructions (Addendum)
You have received an injection of steroids into the joint. 15% of patients will have increased pain within the 24 hours postinjection.   This is transient and will go away.   We recommend that you use ice packs on the injection site for 20 minutes every 2 hours and extra strength Tylenol 2 tablets every 8 as needed until the pain resolves.  If you continue to have pain after taking the Tylenol and using the ice please call the office for further instructions.  neurontin for leg pain   Ibuprofen 800 mg 3 x a day for leg pain    Radicular Pain Radicular pain is a type of pain that spreads from your back or neck along a spinal nerve. Spinal nerves are nerves that leave the spinal cord and go to the muscles. Radicular pain is sometimes called radiculopathy, radiculitis, or a pinched nerve. When you have this type of pain, you may also have weakness, numbness, or tingling in the area of your body that is supplied by the nerve. The pain may feel sharp and burning. Depending on which spinal nerve is affected, the pain may occur in the:  Neck area (cervical radicular pain). You may also feel pain, numbness, weakness, or tingling in the arms.  Mid-spine area (thoracic radicular pain). You would feel this pain in the back and chest. This type is rare.  Lower back area (lumbar radicular pain). You would feel this pain as low back pain. You may feel pain, numbness, weakness, or tingling in the buttocks or legs. Sciatica is a type of lumbar radicular pain that shoots down the back of the leg. Radicular pain occurs when one of the spinal nerves becomes irritated or squeezed (compressed). It is often caused by something pushing on a spinal nerve, such as one of the bones of the spine (vertebrae) or one of the round cushions between vertebrae (intervertebral disks). This can result from:  An injury.  Wear and tear or aging of a disk.  The growth of a bone spur that pushes on the nerve. Radicular pain  often goes away when you follow instructions from your health care provider for relieving pain at home. Follow these instructions at home: Managing pain      If directed, put ice on the affected area: ? Put ice in a plastic bag. ? Place a towel between your skin and the bag. ? Leave the ice on for 20 minutes, 2-3 times a day.  If directed, apply heat to the affected area as often as told by your health care provider. Use the heat source that your health care provider recommends, such as a moist heat pack or a heating pad. ? Place a towel between your skin and the heat source. ? Leave the heat on for 20-30 minutes. ? Remove the heat if your skin turns bright red. This is especially important if you are unable to feel pain, heat, or cold. You may have a greater risk of getting burned. Activity   Do not sit or rest in bed for long periods of time.  Try to stay as active as possible. Ask your health care provider what type of exercise or activity is best for you.  Avoid activities that make your pain worse, such as bending and lifting.  Do not lift anything that is heavier than 10 lb (4.5 kg), or the limit that you are told, until your health care provider says that it is safe.  Practice using proper technique when  lifting items. Proper lifting technique involves bending your knees and rising up.  Do strength and range-of-motion exercises only as told by your health care provider or physical therapist. General instructions  Take over-the-counter and prescription medicines only as told by your health care provider.  Pay attention to any changes in your symptoms.  Keep all follow-up visits as told by your health care provider. This is important. ? Your health care provider may send you to a physical therapist to help with this pain. Contact a health care provider if:  Your pain and other symptoms get worse.  Your pain medicine is not helping.  Your pain has not improved after a  few weeks of home care.  You have a fever. Get help right away if:  You have severe pain, weakness, or numbness.  You have difficulty with bladder or bowel control. Summary  Radicular pain is a type of pain that spreads from your back or neck along a spinal nerve.  When you have radicular pain, you may also have weakness, numbness, or tingling in the area of your body that is supplied by the nerve.  The pain may feel sharp or burning.  Radicular pain may be treated with ice, heat, medicines, or physical therapy. This information is not intended to replace advice given to you by your health care provider. Make sure you discuss any questions you have with your health care provider. Document Revised: 06/10/2018 Document Reviewed: 06/10/2018 Elsevier Patient Education  [place].

## 2020-02-01 ENCOUNTER — Other Ambulatory Visit: Payer: Self-pay

## 2020-02-01 MED ORDER — CYCLOBENZAPRINE HCL 5 MG PO TABS: ORAL_TABLET | ORAL | 1 refills | Status: DC

## 2020-03-23 ENCOUNTER — Other Ambulatory Visit: Payer: Self-pay | Admitting: Family Medicine

## 2020-03-27 ENCOUNTER — Other Ambulatory Visit: Payer: Self-pay | Admitting: Family Medicine

## 2020-05-05 DIAGNOSIS — I10 Essential (primary) hypertension: Secondary | ICD-10-CM | POA: Diagnosis not present

## 2020-05-06 LAB — BASIC METABOLIC PANEL WITH GFR
BUN: 22 mg/dL (ref 7–25)
CO2: 26 mmol/L (ref 20–32)
Calcium: 9.2 mg/dL (ref 8.6–10.4)
Chloride: 109 mmol/L (ref 98–110)
Creat: 0.6 mg/dL (ref 0.60–0.93)
GFR, Est African American: 105 mL/min/{1.73_m2} (ref >=60)
GFR, Est Non African American: 90 mL/min/{1.73_m2} (ref >=60)
Glucose, Bld: 90 mg/dL (ref 65–139)
Potassium: 4.2 mmol/L (ref 3.5–5.3)
Sodium: 143 mmol/L (ref 135–146)

## 2020-05-06 LAB — TSH: TSH: 2.14 mIU/L (ref 0.40–4.50)

## 2020-05-10 ENCOUNTER — Encounter (HOSPITAL_COMMUNITY): Payer: Self-pay | Admitting: *Deleted

## 2020-05-10 ENCOUNTER — Encounter: Payer: Self-pay | Admitting: Family Medicine

## 2020-05-10 ENCOUNTER — Other Ambulatory Visit: Payer: Self-pay

## 2020-05-10 ENCOUNTER — Ambulatory Visit (INDEPENDENT_AMBULATORY_CARE_PROVIDER_SITE_OTHER): Payer: Medicare Other | Admitting: Family Medicine

## 2020-05-10 VITALS — BP 130/80 | HR 70 | Temp 97.3°F | Resp 15 | Ht 62.0 in | Wt 165.0 lb

## 2020-05-10 DIAGNOSIS — E669 Obesity, unspecified: Secondary | ICD-10-CM

## 2020-05-10 DIAGNOSIS — M25561 Pain in right knee: Secondary | ICD-10-CM | POA: Diagnosis not present

## 2020-05-10 DIAGNOSIS — E039 Hypothyroidism, unspecified: Secondary | ICD-10-CM

## 2020-05-10 DIAGNOSIS — J309 Allergic rhinitis, unspecified: Secondary | ICD-10-CM | POA: Insufficient documentation

## 2020-05-10 DIAGNOSIS — G8929 Other chronic pain: Secondary | ICD-10-CM

## 2020-05-10 DIAGNOSIS — M25562 Pain in left knee: Secondary | ICD-10-CM | POA: Diagnosis not present

## 2020-05-10 DIAGNOSIS — J3089 Other allergic rhinitis: Secondary | ICD-10-CM

## 2020-05-10 DIAGNOSIS — Z87891 Personal history of nicotine dependence: Secondary | ICD-10-CM

## 2020-05-10 DIAGNOSIS — I10 Essential (primary) hypertension: Secondary | ICD-10-CM

## 2020-05-10 DIAGNOSIS — F17210 Nicotine dependence, cigarettes, uncomplicated: Secondary | ICD-10-CM

## 2020-05-10 MED ORDER — FLUTICASONE PROPIONATE 50 MCG/ACT NA SUSP: 2.0000 | Freq: Every day | NASAL | 6 refills | Status: DC

## 2020-05-10 MED ORDER — PREDNISONE 5 MG PO TABS: 5.0000 mg | ORAL_TABLET | Freq: Two times a day (BID) | ORAL | 0 refills | Status: AC

## 2020-05-10 MED ORDER — KETOROLAC TROMETHAMINE 60 MG/2ML IM SOLN
60.0000 mg | Freq: Once | INTRAMUSCULAR | Status: AC
  Administered 2020-05-10: 60 mg via INTRAMUSCULAR

## 2020-05-10 MED ORDER — METHYLPREDNISOLONE ACETATE 80 MG/ML IJ SUSP
80.0000 mg | Freq: Once | INTRAMUSCULAR | Status: AC
  Administered 2020-05-10: 80 mg via INTRAMUSCULAR

## 2020-05-10 NOTE — Progress Notes (Signed)
Patient referred to our Lung Cancer Screening program.  I attempted to make contact with the patient today to schedule. I left a detailed VM and asked that she return my call to schedule.

## 2020-05-10 NOTE — Assessment & Plan Note (Signed)
  Patient re-educated about  the importance of commitment to a  minimum of 150 minutes of exercise per week as able.  The importance of healthy food choices with portion control discussed, as well as eating regularly and within a 12 hour window most days. The need to choose "clean , green" food 50 to 75% of the time is discussed, as well as to make water the primary drink and set a goal of 64 ounces water daily.    Weight /BMI 05/10/2020 01/27/2020 01/12/2020  WEIGHT 165 lb 164 lb 167 lb 12.8 oz  HEIGHT 5\' 2"  5\' 2"  5\' 2"   BMI 30.18 kg/m2 30 kg/m2 30.69 kg/m2

## 2020-05-10 NOTE — Patient Instructions (Signed)
Annual physical exam with MD dec 1 or after, call if you need me sooner  New for allergies is Flonase nose spray, use daily  We will schedule your chest scan and reschedule your Rheumatology appointment and make you aware of new dates, please keep them!  Blood pressure and thyroid are controlled, no medication change  After you finish current once weekly vitamin D, no more refills, start once daily vitamin D3, 2000 IU   Toradol 60 mg and depo medrol 80- mg IM in office today for arthritic ain in ht e knees, followed by 5 day course of prednisone  It is important that you exercise regularly at least 30 minutes 5 times a week. If you develop chest pain, have severe difficulty breathing, or feel very tired, stop exercising immediately and seek medical attention    It is important that you exercise regularly at least 30 minutes 5 times a week. If you develop chest pain, have severe difficulty breathing, or feel very tired, stop exercising immediately and seek medical attention   A healthy diet is rich in fruit, vegetables and whole grains. Poultry fish, nuts and beans are a healthy choice for protein rather then red meat. A low sodium diet and drinking 64 ounces of water daily is generally recommended. Oils and sweet should be limited. Carbohydrates especially for those who are diabetic or overweight, should be limited to 34-45 gram per meal. It is important to eat on a regular schedule, at least 3 times daily. Snacks should be primarily fruits, vegetables or nuts.  Thanks for choosing [facility], we consider it a privelige to serve you.    Thanks for choosing [facility], we consider it a privelige to serve you.

## 2020-05-10 NOTE — Progress Notes (Signed)
   Bianca Mcguire     MRN: [id number]      DOB: [birth year]   HPI [name] is here for follow up and re-evaluation of chronic medical conditions, medication management and review of any available recent lab and radiology data.  Preventive health is updated, specifically  Cancer screening and Immunization.   Questions or concerns regarding consultations or procedures which the PT has had in the interim are  addressed. The PT denies any adverse reactions to current medications since the last visit.  C/o uncontrolled bilateral knee pain C/o uncontrolled allergy symptoms despite daily singulair  ROS Denies recent fever or chills. Denies sinus pressure, nasal congestion, ear pain or sore throat. Denies chest congestion,has a chronic  cough denies  wheezing. Denies chest pains, palpitations and leg swelling Denies abdominal pain, nausea, vomiting,diarrhea or constipation.   Denies dysuria, frequency, hesitancy or incontinence. Denies headaches, seizures, numbness, or tingling. Denies depression, anxiety or insomnia. Denies skin break down or rash.   PE  BP 130/80   Pulse 70   Temp (!) 97.3 F (36.3 C) (Temporal)   Resp 15   Ht 5\' 2"  (1.575 m)   Wt 165 lb (74.8 kg)   SpO2 94%   BMI 30.18 kg/m   Patient alert and oriented and in no cardiopulmonary distress.  HEENT: No facial asymmetry, EOMI,     Neck supple .  Chest: Clear to auscultation bilaterally.decreased air entry CVS: S1, S2 no murmurs, no S3.Regular rate.  ABD: Soft non tender.   Ext: No edema  MS: Decreased  ROM spine,hips and knees.  Skin: Intact, no ulcerations or rash noted.  Psych: Good eye contact, normal affect. Memory intact not anxious or depressed appearing.  CNS: CN 2-12 intact, power,  normal throughout.no focal deficits noted.   Assessment & Plan  Knee pain, bilateral Currently uncontrolled, rated at 8 Uncontrolled.Toradol and depo medrol administered IM in the office , to be followed by a  short course of oral prednisone   H/O nicotine dependence Updates screening chest scan needed  Obesity (BMI 30.0-34.9)  Patient re-educated about  the importance of commitment to a  minimum of 150 minutes of exercise per week as able.  The importance of healthy food choices with portion control discussed, as well as eating regularly and within a 12 hour window most days. The need to choose "clean , green" food 50 to 75% of the time is discussed, as well as to make water the primary drink and set a goal of 64 ounces water daily.    Weight /BMI 05/10/2020 01/27/2020 01/12/2020  WEIGHT 165 lb 164 lb 167 lb 12.8 oz  HEIGHT 5\' 2"  5\' 2"  5\' 2"   BMI 30.18 kg/m2 30 kg/m2 30.69 kg/m2      Essential hypertension Controlled, no change in medication DASH diet and commitment to daily physical activity for a minimum of 30 minutes discussed and encouraged, as a part of hypertension management. The importance of attaining a healthy weight is also discussed.  BP/Weight 05/10/2020 01/27/2020 01/12/2020 11/09/2019 08/12/2019 08/04/2019 [id number]  Systolic BP [id number] Q000111Q [id number]  Diastolic BP 80 79 68 68 72 45 76  Wt. (Lbs) 165 164 167.8 169 160 156 176.12  BMI 30.18 30 30.69 30.91 29.26 28.53 32.21       Adult hypothyroidism Controlled, no change in medication   Allergic rhinitis Uncontrolled, continue singulair and add flonase

## 2020-05-10 NOTE — Assessment & Plan Note (Signed)
Currently uncontrolled, rated at 8 Uncontrolled.Toradol and depo medrol administered IM in the office , to be followed by a short course of oral prednisone

## 2020-05-10 NOTE — Assessment & Plan Note (Signed)
Controlled, no change in medication DASH diet and commitment to daily physical activity for a minimum of 30 minutes discussed and encouraged, as a part of hypertension management. The importance of attaining a healthy weight is also discussed.  BP/Weight 05/10/2020 01/27/2020 01/12/2020 11/09/2019 08/12/2019 08/04/2019 [id number]  Systolic BP [id number] Q000111Q Q000111Q [id number]  Diastolic BP 80 79 68 68 72 45 76  Wt. (Lbs) 165 164 167.8 169 160 156 176.12  BMI 30.18 30 30.69 30.91 29.26 28.53 32.21

## 2020-05-10 NOTE — Assessment & Plan Note (Signed)
Controlled, no change in medication  

## 2020-05-10 NOTE — Assessment & Plan Note (Signed)
Uncontrolled, continue singulair and add flonase

## 2020-05-10 NOTE — Assessment & Plan Note (Signed)
Updates screening chest scan needed

## 2020-05-17 ENCOUNTER — Other Ambulatory Visit: Payer: Self-pay | Admitting: Orthopedic Surgery

## 2020-05-30 ENCOUNTER — Ambulatory Visit (HOSPITAL_COMMUNITY): Payer: Medicare Other

## 2020-06-15 ENCOUNTER — Other Ambulatory Visit: Payer: Self-pay | Admitting: Orthopedic Surgery

## 2020-06-21 ENCOUNTER — Other Ambulatory Visit: Payer: Self-pay | Admitting: Orthopedic Surgery

## 2020-06-28 ENCOUNTER — Encounter (HOSPITAL_COMMUNITY): Payer: Self-pay

## 2020-06-28 NOTE — Progress Notes (Signed)
Patient was referred to the [facility] by Dr. Moshe Cipro. Attempted to contact patient to schedule LDCT. Voicemail left requesting that the patient return my call.

## 2020-07-09 ENCOUNTER — Other Ambulatory Visit: Payer: Self-pay | Admitting: Family Medicine

## 2020-07-14 DIAGNOSIS — M2550 Pain in unspecified joint: Secondary | ICD-10-CM | POA: Diagnosis not present

## 2020-07-14 DIAGNOSIS — M0579 Rheumatoid arthritis with rheumatoid factor of multiple sites without organ or systems involvement: Secondary | ICD-10-CM | POA: Diagnosis not present

## 2020-07-14 DIAGNOSIS — R5383 Other fatigue: Secondary | ICD-10-CM | POA: Diagnosis not present

## 2020-07-14 DIAGNOSIS — M545 Low back pain: Secondary | ICD-10-CM | POA: Diagnosis not present

## 2020-07-18 ENCOUNTER — Other Ambulatory Visit: Payer: Self-pay | Admitting: Family Medicine

## 2020-07-20 ENCOUNTER — Encounter (HOSPITAL_COMMUNITY): Payer: Self-pay

## 2020-07-20 NOTE — Progress Notes (Signed)
Attempted to contact the patient once again regarding the Lung Cancer Screening Program. I left a detailed message about the program with my contact information and encouraged the patient to return my call to proceed with program enrollment. I will close the referral at this time due to multiple failed attempts to contact patient. Referring provider aware.

## 2020-07-22 ENCOUNTER — Encounter: Payer: Self-pay | Admitting: Family Medicine

## 2020-07-22 ENCOUNTER — Telehealth (INDEPENDENT_AMBULATORY_CARE_PROVIDER_SITE_OTHER): Payer: Medicare Other | Admitting: Family Medicine

## 2020-07-22 ENCOUNTER — Other Ambulatory Visit: Payer: Self-pay

## 2020-07-22 VITALS — BP 130/80 | Ht 62.0 in | Wt 165.0 lb

## 2020-07-22 DIAGNOSIS — Z0000 Encounter for general adult medical examination without abnormal findings: Secondary | ICD-10-CM | POA: Diagnosis not present

## 2020-07-22 DIAGNOSIS — Z780 Asymptomatic menopausal state: Secondary | ICD-10-CM | POA: Diagnosis not present

## 2020-07-22 NOTE — Patient Instructions (Signed)
Bianca Mcguire , Thank you for taking time to come for your Medicare Wellness Visit. I appreciate your ongoing commitment to your health goals. Please review the following plan we discussed and let me know if I can assist you in the future.   Please continue to practice social distancing to keep you, your family, and our community safe.  If you must go out, please wear a Mask and practice good handwashing.  Screening recommendations/referrals: Colonoscopy: Up to date Mammogram: Dec next check Bone Density: Updated ordered Recommended yearly ophthalmology/optometry visit for glaucoma screening and checkup Recommended yearly dental visit for hygiene and checkup  Vaccinations: up to date  Advanced directives: Discussed  Conditions/risks identified: Falls  Next appointment: 11/15/2020    Preventive Care 74 Years and Older, Female Preventive care refers to lifestyle choices and visits with your health care provider that can promote health and wellness. What does preventive care include?  A yearly physical exam. This is also called an annual well check.  Dental exams once or twice a year.  Routine eye exams. Ask your health care provider how often you should have your eyes checked.  Personal lifestyle choices, including:  Daily care of your teeth and gums.  Regular physical activity.  Eating a healthy diet.  Avoiding tobacco and drug use.  Limiting alcohol use.  Practicing safe sex.  Taking low-dose aspirin every day.  Taking vitamin and mineral supplements as recommended by your health care provider. What happens during an annual well check? The services and screenings done by your health care provider during your annual well check will depend on your age, overall health, lifestyle risk factors, and family history of disease. Counseling  Your health care provider may ask you questions about your:  Alcohol use.  Tobacco use.  Drug use.  Emotional well-being.  Home  and relationship well-being.  Sexual activity.  Eating habits.  History of falls.  Memory and ability to understand (cognition).  Work and work Statistician.  Reproductive health. Screening  You may have the following tests or measurements:  Height, weight, and BMI.  Blood pressure.  Lipid and cholesterol levels. These may be checked every 5 years, or more frequently if you are over 53 years old.  Skin check.  Lung cancer screening. You may have this screening every year starting at age 29 if you have a 30-pack-year history of smoking and currently smoke or have quit within the past 15 years.  Fecal occult blood test (FOBT) of the stool. You may have this test every year starting at age 57.  Flexible sigmoidoscopy or colonoscopy. You may have a sigmoidoscopy every 5 years or a colonoscopy every 10 years starting at age 54.  Hepatitis C blood test.  Hepatitis B blood test.  Sexually transmitted disease (STD) testing.  Diabetes screening. This is done by checking your blood sugar (glucose) after you have not eaten for a while (fasting). You may have this done every 1-3 years.  Bone density scan. This is done to screen for osteoporosis. You may have this done starting at age 43.  Mammogram. This may be done every 1-2 years. Talk to your health care provider about how often you should have regular mammograms. Talk with your health care provider about your test results, treatment options, and if necessary, the need for more tests. Vaccines  Your health care provider may recommend certain vaccines, such as:  Influenza vaccine. This is recommended every year.  Tetanus, diphtheria, and acellular pertussis (Tdap, Td) vaccine. You  may need a Td booster every 10 years.  Zoster vaccine. You may need this after age 6.  Pneumococcal 13-valent conjugate (PCV13) vaccine. One dose is recommended after age 86.  Pneumococcal polysaccharide (PPSV23) vaccine. One dose is recommended  after age 97. Talk to your health care provider about which screenings and vaccines you need and how often you need them. This information is not intended to replace advice given to you by your health care provider. Make sure you discuss any questions you have with your health care provider. Document Released: 12/23/2015 Document Revised: 08/15/2016 Document Reviewed: 09/27/2015 Elsevier Interactive Patient Education  2017 [employer] Prevention in the Home Falls can cause injuries. They can happen to people of all ages. There are many things you can do to make your home safe and to help prevent falls. What can I do on the outside of my home?  Regularly fix the edges of walkways and driveways and fix any cracks.  Remove anything that might make you trip as you walk through a door, such as a raised step or threshold.  Trim any bushes or trees on the path to your home.  Use bright outdoor lighting.  Clear any walking paths of anything that might make someone trip, such as rocks or tools.  Regularly check to see if handrails are loose or broken. Make sure that both sides of any steps have handrails.  Any raised decks and porches should have guardrails on the edges.  Have any leaves, snow, or ice cleared regularly.  Use sand or salt on walking paths during winter.  Clean up any spills in your garage right away. This includes oil or grease spills. What can I do in the bathroom?  Use night lights.  Install grab bars by the toilet and in the tub and shower. Do not use towel bars as grab bars.  Use non-skid mats or decals in the tub or shower.  If you need to sit down in the shower, use a plastic, non-slip stool.  Keep the floor dry. Clean up any water that spills on the floor as soon as it happens.  Remove soap buildup in the tub or shower regularly.  Attach bath mats securely with double-sided non-slip rug tape.  Do not have throw rugs and other things on the floor  that can make you trip. What can I do in the bedroom?  Use night lights.  Make sure that you have a light by your bed that is easy to reach.  Do not use any sheets or blankets that are too big for your bed. They should not hang down onto the floor.  Have a firm chair that has side arms. You can use this for support while you get dressed.  Do not have throw rugs and other things on the floor that can make you trip. What can I do in the kitchen?  Clean up any spills right away.  Avoid walking on wet floors.  Keep items that you use a lot in easy-to-reach places.  If you need to reach something above you, use a strong step stool that has a grab bar.  Keep electrical cords out of the way.  Do not use floor polish or wax that makes floors slippery. If you must use wax, use non-skid floor wax.  Do not have throw rugs and other things on the floor that can make you trip. What can I do with my stairs?  Do not leave any items  on the stairs.  Make sure that there are handrails on both sides of the stairs and use them. Fix handrails that are broken or loose. Make sure that handrails are as long as the stairways.  Check any carpeting to make sure that it is firmly attached to the stairs. Fix any carpet that is loose or worn.  Avoid having throw rugs at the top or bottom of the stairs. If you do have throw rugs, attach them to the floor with carpet tape.  Make sure that you have a light switch at the top of the stairs and the bottom of the stairs. If you do not have them, ask someone to add them for you. What else can I do to help prevent falls?  Wear shoes that:  Do not have high heels.  Have rubber bottoms.  Are comfortable and fit you well.  Are closed at the toe. Do not wear sandals.  If you use a stepladder:  Make sure that it is fully opened. Do not climb a closed stepladder.  Make sure that both sides of the stepladder are locked into place.  Ask someone to hold it  for you, if possible.  Clearly mark and make sure that you can see:  Any grab bars or handrails.  First and last steps.  Where the edge of each step is.  Use tools that help you move around (mobility aids) if they are needed. These include:  Canes.  Walkers.  Scooters.  Crutches.  Turn on the lights when you go into a dark area. Replace any light bulbs as soon as they burn out.  Set up your furniture so you have a clear path. Avoid moving your furniture around.  If any of your floors are uneven, fix them.  If there are any pets around you, be aware of where they are.  Review your medicines with your doctor. Some medicines can make you feel dizzy. This can increase your chance of falling. Ask your doctor what other things that you can do to help prevent falls. This information is not intended to replace advice given to you by your health care provider. Make sure you discuss any questions you have with your health care provider. Document Released: 09/22/2009 Document Revised: 05/03/2016 Document Reviewed: 12/31/2014 Elsevier Interactive Patient Education  2017 [employer].

## 2020-07-22 NOTE — Progress Notes (Addendum)
Subjective:   Bianca Mcguire is a 74 y.o. female who presents for Medicare Annual (Subsequent) preventive examination.  Method of visit: Telephone  Location of Patient: Home Location of Provider: Office Consent was obtain for visit to be over via a telehealth platform. Services rendered by provider: Visit was performed via telephone/audio only   I verified that I am speaking with the correct person using two identifiers.     Review of Systems    yes    Objective:    There were no vitals filed for this visit. There is no height or weight on file to calculate BMI.  Advanced Directives 08/04/2019 10/16/2018 10/13/2018 09/26/2017 09/16/2017 01/07/2017  Does Patient Have a Medical Advance Directive? No No No No No No  Would patient like information on creating a medical advance directive? - No - Patient declined No - Patient declined No - Patient declined No - Patient declined Yes (MAU/Ambulatory/Procedural Areas - Information given)    Current Medications (verified) Outpatient Encounter Medications as of 07/22/2020  Medication Sig  . ibuprofen (ADVIL) 800 MG tablet TAKE 1 TABLET(800 MG) BY MOUTH EVERY 8 HOURS AS NEEDED  . acetaminophen (TYLENOL) 500 MG tablet One twice daily for arthritic pain (Patient taking differently: Take 1,000 mg by mouth every 4 (four) hours as needed for mild pain. One twice daily for arthritic pain)  . alendronate (FOSAMAX) 70 MG tablet TAKE 1 TABLET BY MOUTH  EVERY 7 DAYS WITH A FULL  GLASS OF WATER ON AN EMPTY  STOMACH  . amLODipine (NORVASC) 5 MG tablet TAKE 1 TABLET BY MOUTH  DAILY  . Calcium Carb-Cholecalciferol (CALCIUM 600 + D PO) Take 1 tablet by mouth every evening.   . cyclobenzaprine (FLEXERIL) 5 MG tablet Take one tablet twice weekly as needed, for back spasm  . famotidine (PEPCID) 40 MG tablet Take 1 tablet (40 mg total) by mouth daily.  . fluticasone (FLONASE) 50 MCG/ACT nasal spray Place 2 sprays into both nostrils daily.  . folic acid  (FOLVITE) 124 MCG tablet Take 400 mcg by mouth every evening.   . gabapentin (NEURONTIN) 100 MG capsule TAKE 1 CAPSULE(100 MG) BY MOUTH THREE TIMES DAILY  . IRON-B12-VITAMINS PO Take 500 mcg by mouth every evening.   Marland Kitchen levothyroxine (SYNTHROID) 50 MCG tablet Take one tablet once daily from Monday through Thursday.  Then take one and a half tablets once daily on Friday Saturday and Sunday  . montelukast (SINGULAIR) 10 MG tablet TAKE 1 TABLET BY MOUTH AT  BEDTIME  . Multiple Vitamins-Iron (MULTIVITAMINS WITH IRON) TABS tablet Take 1 tablet by mouth every evening.  . neomycin-polymyxin-hydrocortisone (CORTISPORIN) OTIC solution Apply 1 to 2 drops to toe BID after soaking  . omeprazole (PRILOSEC) 20 MG capsule TAKE 1 CAPSULE BY MOUTH  DAILY 1/2 HOUR PRIOR TO  BREAKFAST   No facility-administered encounter medications on file as of 07/22/2020.    Allergies (verified) Patient has no known allergies.   History: Past Medical History:  Diagnosis Date  . Acute pain of right knee 01/30/2011   Qualifier: Diagnosis of  By: Moshe Cipro MD, Joycelyn Schmid    . Anemia   . Arthritis   . Carpal tunnel syndrome, bilateral   . Diverticulosis dx 2011  . GERD (gastroesophageal reflux disease)   . Headache   . Hypertension 06/02/2018  . Low back pain   . Obesity   . Rheumatoid arthritis(714.0)    Past Surgical History:  Procedure Laterality Date  . bilateral foot surgery    .  COLONOSCOPY  2011   Dr. Oneida Alar: 14mm sessile polyp (tubular adenoma), pancolonic diverticulosis  . COLONOSCOPY N/A 09/26/2017   Procedure: COLONOSCOPY;  Surgeon: Danie Binder, MD;  [facility];  Service: Endoscopy;  Laterality: N/A;  7:30am  . ESOPHAGOGASTRODUODENOSCOPY N/A 09/16/2017   Procedure: ESOPHAGOGASTRODUODENOSCOPY (EGD);  Surgeon: Danie Binder, MD;  Location: [facility];  Service: Endoscopy;  Laterality: N/A;  . FLEXIBLE SIGMOIDOSCOPY N/A 09/16/2017   Procedure: FLEXIBLE SIGMOIDOSCOPY;  Surgeon: Danie Binder, MD;  Location: [facility];  Service: Endoscopy;  Laterality: N/A;  . Onset  . KNEE ARTHROSCOPY WITH LATERAL MENISECTOMY Right 10/16/2018   Procedure: KNEE ARTHROSCOPY WITH LATERAL MENISCECTOMY;  Surgeon: Carole Civil, MD;  Location: [facility];  Service: Orthopedics;  Laterality: Right;  . OOPHORECTOMY Right   . roophorectomy for benign disease  March 2011   Dr. Glo Herring  . tonsillectomy and adenoidctomy in childhood    . TUBAL LIGATION  1977   Family History  Problem Relation Age of Onset  . Heart failure Mother   . Cancer Mother 44       oral  . Heart failure Father   . Arthritis Sister   . Heart attack Brother   . Thyroid disease Daughter   . Hypertension Daughter   . Hypertension Daughter   . Thyroid disease Daughter   . Heart failure Sister   . Diabetes Sister   . Hypertension Sister   . Leukemia Brother   . Colon cancer Neg Hx   . Colon polyps Neg Hx    Social History   Socioeconomic History  . Marital status: Widowed    Spouse name: Not on file  . Number of children: 3  . Years of education: Not on file  . Highest education level: Not on file  Occupational History  . Occupation: retired    Comment: part time   . Occupation: disabled for arthritis     Comment: since age 77    Employer: [employer]  Tobacco Use  . Smoking status: Former Smoker    Packs/day: 2.00    Years: 25.00    Pack years: 50.00    Types: Cigarettes    Quit date: 05/12/2014    Years since quitting: 6.2  . Smokeless tobacco: Never Used  Vaping Use  . Vaping Use: Never used  Substance and Sexual Activity  . Alcohol use: No  . Drug use: No  . Sexual activity: Not Currently  Other Topics Concern  . Not on file  Social History Narrative   Lives with Granddaughter since 2006   Social Determinants of Health   Financial Resource Strain:   . Difficulty of Paying Living Expenses:   Food Insecurity:   . Worried About Charity fundraiser in the Last Year:   . Arts development officer in the Last Year:   Transportation Needs:   . Film/video editor (Medical):   Marland Kitchen Lack of Transportation (Non-Medical):   Physical Activity:   . Days of Exercise per Week:   . Minutes of Exercise per Session:   Stress:   . Feeling of Stress :   Social Connections:   . Frequency of Communication with Friends and Family:   . Frequency of Social Gatherings with Friends and Family:   . Attends Religious Services:   . Active Member of Clubs or Organizations:   . Attends Archivist Meetings:   Marland Kitchen Marital Status:  Tobacco Counseling Counseling given: Not Answered   Clinical Intake:                 Diabetic?no         Activities of Daily Living No flowsheet data found.  Patient Care Team: Fayrene Helper, MD as PCP - [facility], Marga Melnick, MD (Inactive) as Consulting Physician (Gastroenterology)  Indicate any recent Medical Services you may have received from other than [facility] providers in the past year (date may be approximate).     Assessment:   This is a routine wellness examination for [facility].  Hearing/Vision screen No exam data present  Dietary issues and exercise activities discussed:    Goals    . Exercise 3x per week (30 min per time)     Patient would like to start exercising 3 times a week for at least 30 minutes at a time.      Depression Screen PHQ 2/9 Scores 08/12/2019 04/09/2019 02/24/2019 08/04/2018 06/02/2018 01/07/2017  PHQ - 2 Score 0 0 0 0 0 0  PHQ- 9 Score - - - - 5 -    Fall Risk Fall Risk  05/10/2020 01/12/2020 11/09/2019 08/12/2019 04/09/2019  Falls in the past year? 0 0 1 0 0  Number falls in past yr: 0 0 0 0 -  Injury with Fall? 0 0 1 0 0    Any stairs in or around the home? No  If so, are there any without handrails? No  Home free of loose throw rugs in walkways, pet beds, electrical cords, etc? Yes  Adequate lighting in your home to reduce risk of falls? Yes   ASSISTIVE DEVICES UTILIZED TO PREVENT  FALLS:  Life alert? No  Use of a cane, walker or w/c? No  Grab bars in the bathroom? Yes  Shower chair or bench in shower? Yes  Elevated toilet seat or a handicapped toilet? Yes   TIMED UP AND GO:  Was the test performed? No .  Length of time to ambulate 10 feet: NA sec.     Cognitive Function:     6CIT Screen 04/09/2019 01/07/2017  What Year? 0 points -  What month? 0 points 0 points  What time? 0 points 0 points  Count back from 20 0 points 0 points  Months in reverse 0 points 0 points  Repeat phrase 0 points 0 points  Total Score 0 -    Immunizations Immunization History  Administered Date(s) Administered  . Fluad Quad(high Dose 65+) 08/24/2019  . Influenza Whole 09/23/2008  . Influenza,inj,Quad PF,6+ Mos 08/28/2013, 01/07/2017, 08/04/2018  . Moderna SARS-COVID-2 Vaccination 02/08/2020, 03/07/2020  . PPD Test 08/28/2013  . Pneumococcal Conjugate-13 07/05/2014  . Pneumococcal Polysaccharide-23 09/07/2008, 09/15/2013    TDAP status: Up to date Flu Vaccine status: Up to date Pneumococcal vaccine status: Up to date Covid-19 vaccine status: Completed vaccines  Qualifies for Shingles Vaccine? Yes   Zostavax completed No   Shingrix Completed?: No.    Education has been provided regarding the importance of this vaccine. Patient has been advised to call insurance company to determine out of pocket expense if they have not yet received this vaccine. Advised may also receive vaccine at local pharmacy or Health Dept. Verbalized acceptance and understanding.  Screening Tests Health Maintenance  Topic Date Due  . INFLUENZA VACCINE  07/10/2020  . TETANUS/TDAP  11/13/2020 (Originally 07/24/1965)  . MAMMOGRAM  11/18/2021  . COLONOSCOPY  09/26/2022  . DEXA SCAN  Completed  . COVID-19 Vaccine  Completed  . Hepatitis C Screening  Completed  . PNA vac Low Risk Adult  Completed    Health Maintenance  Health Maintenance Due  Topic Date Due  . INFLUENZA VACCINE  07/10/2020      Colorectal cancer screening: Completed 09-26-17. Repeat every 10 years Mammogram status: Completed 11-19-19. Repeat every year Bone Density status: Completed 04-21-14. Results reflect: Bone density results: NORMAL. Repeat every 5 years.  Lung Cancer Screening: (Low Dose CT Chest recommended if Age 73-80 years, 30 pack-year currently smoking OR have quit w/in 15years.) does not qualify.   Lung Cancer Screening Referral: no  Additional Screening:  Hepatitis C Screening: does not qualify;   Vision Screening: Recommended annual ophthalmology exams for early detection of glaucoma and other disorders of the eye. Is the patient up to date with their annual eye exam?  Yes  Who is the provider or what is the name of the office in which the patient attends annual eye exams? Dr. Katy Fitch If pt is not established with a provider, would they like to be referred to a provider to establish care? No .   Dental Screening: Recommended annual dental exams for proper oral hygiene  Community Resource Referral / Chronic Care Management: CRR required this visit?  No   CCM required this visit?  No      Plan:     1. Encounter for Medicare annual wellness exam  2. Post-menopausal - DG Bone Density; Future  I have personally reviewed and noted the following in the patient's chart:   . Medical and social history . Use of alcohol, tobacco or illicit drugs  . Current medications and supplements . Functional ability and status . Nutritional status . Physical activity . Advanced directives . List of other physicians . Hospitalizations, surgeries, and ER visits in previous 12 months . Vitals . Screenings to include cognitive, depression, and falls . Referrals and appointments  In addition, I have reviewed and discussed with patient certain preventive protocols, quality metrics, and best practice recommendations. A written personalized care plan for preventive services as well as general preventive  health recommendations were provided to patient.     Shelda Altes, CMA   07/22/2020    I provided 25 minutes of non-face-to-face time during this encounter.

## 2020-08-22 ENCOUNTER — Other Ambulatory Visit: Payer: Self-pay

## 2020-08-22 ENCOUNTER — Ambulatory Visit (HOSPITAL_COMMUNITY)
Admission: RE | Admit: 2020-08-22 | Discharge: 2020-08-22 | Disposition: A | Discharge status: Home / Self Care | Payer: Medicare Other | Source: Ambulatory Visit | Attending: Family Medicine | Admitting: Family Medicine

## 2020-08-22 DIAGNOSIS — Z780 Asymptomatic menopausal state: Secondary | ICD-10-CM | POA: Insufficient documentation

## 2020-08-22 DIAGNOSIS — M810 Age-related osteoporosis without current pathological fracture: Secondary | ICD-10-CM | POA: Diagnosis not present

## 2020-08-26 ENCOUNTER — Other Ambulatory Visit: Payer: Self-pay

## 2020-08-26 MED ORDER — ALENDRONATE SODIUM 70 MG PO TABS: ORAL_TABLET | ORAL | 3 refills | Status: DC

## 2020-11-15 ENCOUNTER — Encounter: Payer: Medicare Other | Admitting: Family Medicine

## 2020-11-20 ENCOUNTER — Other Ambulatory Visit: Payer: Self-pay | Admitting: Family Medicine

## 2020-12-08 ENCOUNTER — Encounter: Payer: Medicare Other | Admitting: Family Medicine

## 2021-01-18 ENCOUNTER — Other Ambulatory Visit: Payer: Self-pay | Admitting: Family Medicine

## 2021-02-16 ENCOUNTER — Other Ambulatory Visit: Payer: Self-pay | Admitting: Orthopedic Surgery

## 2021-02-17 NOTE — Telephone Encounter (Signed)
Rx request 

## 2021-03-17 ENCOUNTER — Other Ambulatory Visit: Payer: Self-pay | Admitting: Orthopedic Surgery

## 2021-03-31 ENCOUNTER — Encounter: Payer: Self-pay | Admitting: Emergency Medicine

## 2021-03-31 ENCOUNTER — Emergency Department (HOSPITAL_COMMUNITY): Payer: Medicare Other

## 2021-03-31 ENCOUNTER — Other Ambulatory Visit: Payer: Self-pay

## 2021-03-31 ENCOUNTER — Encounter (HOSPITAL_COMMUNITY): Payer: Self-pay | Admitting: *Deleted

## 2021-03-31 ENCOUNTER — Emergency Department (HOSPITAL_COMMUNITY)
Admission: EM | Admit: 2021-03-31 | Discharge: 2021-03-31 | Disposition: A | Discharge status: Home / Self Care | Payer: Medicare Other | Attending: Emergency Medicine | Admitting: Emergency Medicine

## 2021-03-31 ENCOUNTER — Ambulatory Visit: Admission: EM | Admit: 2021-03-31 | Discharge: 2021-03-31 | Payer: Medicare Other

## 2021-03-31 DIAGNOSIS — R509 Fever, unspecified: Secondary | ICD-10-CM | POA: Diagnosis not present

## 2021-03-31 DIAGNOSIS — I10 Essential (primary) hypertension: Secondary | ICD-10-CM | POA: Diagnosis not present

## 2021-03-31 DIAGNOSIS — R0602 Shortness of breath: Secondary | ICD-10-CM | POA: Diagnosis not present

## 2021-03-31 DIAGNOSIS — J441 Chronic obstructive pulmonary disease with (acute) exacerbation: Secondary | ICD-10-CM | POA: Insufficient documentation

## 2021-03-31 DIAGNOSIS — R0902 Hypoxemia: Secondary | ICD-10-CM | POA: Insufficient documentation

## 2021-03-31 DIAGNOSIS — Z20822 Contact with and (suspected) exposure to covid-19: Secondary | ICD-10-CM | POA: Insufficient documentation

## 2021-03-31 DIAGNOSIS — Z79899 Other long term (current) drug therapy: Secondary | ICD-10-CM | POA: Diagnosis not present

## 2021-03-31 DIAGNOSIS — J069 Acute upper respiratory infection, unspecified: Secondary | ICD-10-CM | POA: Insufficient documentation

## 2021-03-31 DIAGNOSIS — J449 Chronic obstructive pulmonary disease, unspecified: Secondary | ICD-10-CM | POA: Diagnosis not present

## 2021-03-31 DIAGNOSIS — R6889 Other general symptoms and signs: Secondary | ICD-10-CM | POA: Diagnosis not present

## 2021-03-31 DIAGNOSIS — E038 Other specified hypothyroidism: Secondary | ICD-10-CM | POA: Insufficient documentation

## 2021-03-31 DIAGNOSIS — R059 Cough, unspecified: Secondary | ICD-10-CM | POA: Diagnosis not present

## 2021-03-31 DIAGNOSIS — Z87891 Personal history of nicotine dependence: Secondary | ICD-10-CM | POA: Diagnosis not present

## 2021-03-31 LAB — LACTIC ACID, PLASMA
Lactic Acid, Venous: 2.2 mmol/L (ref 0.5–1.9)
Lactic Acid, Venous: 1.1 mmol/L (ref 0.5–1.9)

## 2021-03-31 LAB — CBC WITH DIFFERENTIAL/PLATELET
Band Neutrophils: 20 %
Basophils Absolute: 0 10*3/uL (ref 0.0–0.1)
Basophils Relative: 0 %
Eosinophils Absolute: 0.1 10*3/uL (ref 0.0–0.5)
Eosinophils Relative: 1 %
HCT: 38.8 % (ref 36.0–46.0)
Hemoglobin: 11.9 g/dL — ABNORMAL LOW (ref 12.0–15.0)
Lymphocytes Relative: 18 %
Lymphs Abs: 2.2 10*3/uL (ref 0.7–4.0)
MCH: 27.1 pg (ref 26.0–34.0)
MCHC: 30.7 g/dL (ref 30.0–36.0)
MCV: 88.4 fL (ref 80.0–100.0)
Monocytes Absolute: 1 10*3/uL (ref 0.1–1.0)
Monocytes Relative: 8 %
Neutro Abs: 8.8 10*3/uL — ABNORMAL HIGH (ref 1.7–7.7)
Neutrophils Relative %: 53 %
Platelets: 210 10*3/uL (ref 150–400)
RBC: 4.39 MIL/uL (ref 3.87–5.11)
RDW: 14.4 % (ref 11.5–15.5)
WBC Morphology: INCREASED
WBC: 12.1 10*3/uL — ABNORMAL HIGH (ref 4.0–10.5)
nRBC: 0 % (ref 0.0–0.2)

## 2021-03-31 LAB — RESP PANEL BY RT-PCR (FLU A&B, COVID) ARPGX2
Influenza A by PCR: NEGATIVE
Influenza B by PCR: NEGATIVE
SARS Coronavirus 2 by RT PCR: NEGATIVE

## 2021-03-31 LAB — COMPREHENSIVE METABOLIC PANEL
ALT: 22 U/L (ref 0–44)
AST: 29 U/L (ref 15–41)
Albumin: 3.4 g/dL — ABNORMAL LOW (ref 3.5–5.0)
Alkaline Phosphatase: 112 U/L (ref 38–126)
Anion gap: 9 (ref 5–15)
BUN: 22 mg/dL (ref 8–23)
CO2: 24 mmol/L (ref 22–32)
Calcium: 8.7 mg/dL — ABNORMAL LOW (ref 8.9–10.3)
Chloride: 108 mmol/L (ref 98–111)
Creatinine, Ser: 0.92 mg/dL (ref 0.44–1.00)
GFR, Estimated: >60 mL/min (ref >60)
Glucose, Bld: 165 mg/dL — ABNORMAL HIGH (ref 70–99)
Potassium: 3.2 mmol/L — ABNORMAL LOW (ref 3.5–5.1)
Sodium: 141 mmol/L (ref 135–145)
Total Bilirubin: 0.6 mg/dL (ref 0.3–1.2)
Total Protein: 7 g/dL (ref 6.5–8.1)

## 2021-03-31 LAB — TRIGLYCERIDES: Triglycerides: 87 mg/dL (ref <150)

## 2021-03-31 LAB — FIBRINOGEN: Fibrinogen: 692 mg/dL — ABNORMAL HIGH (ref 210–475)

## 2021-03-31 LAB — PROCALCITONIN: Procalcitonin: 4.53 ng/mL

## 2021-03-31 LAB — FERRITIN: Ferritin: 249 ng/mL (ref 11–307)

## 2021-03-31 LAB — C-REACTIVE PROTEIN: CRP: 35 mg/dL — ABNORMAL HIGH (ref <1.0)

## 2021-03-31 LAB — LACTATE DEHYDROGENASE: LDH: 144 U/L (ref 98–192)

## 2021-03-31 LAB — D-DIMER, QUANTITATIVE: D-Dimer, Quant: 2.04 ug/mL-FEU — ABNORMAL HIGH (ref 0.00–0.50)

## 2021-03-31 MED ORDER — METHYLPREDNISOLONE 4 MG PO TBPK: ORAL_TABLET | Freq: Every day | ORAL | 0 refills | Status: DC

## 2021-03-31 MED ORDER — SODIUM CHLORIDE 0.9 % IV SOLN
1000.0000 mL | INTRAVENOUS | Status: DC
  Administered 2021-03-31: 1000 mL via INTRAVENOUS

## 2021-03-31 MED ORDER — DOXYCYCLINE HYCLATE 100 MG PO CAPS: 100.0000 mg | ORAL_CAPSULE | Freq: Two times a day (BID) | ORAL | 0 refills | Status: DC

## 2021-03-31 MED ORDER — DOXYCYCLINE HYCLATE 100 MG PO TABS
100.0000 mg | ORAL_TABLET | Freq: Once | ORAL | Status: AC
  Administered 2021-03-31: 100 mg via ORAL
  Filled 2021-03-31: qty 1

## 2021-03-31 MED ORDER — IPRATROPIUM-ALBUTEROL 0.5-2.5 (3) MG/3ML IN SOLN
3.0000 mL | Freq: Once | RESPIRATORY_TRACT | Status: AC
  Administered 2021-03-31: 3 mL via RESPIRATORY_TRACT
  Filled 2021-03-31: qty 3

## 2021-03-31 MED ORDER — METHYLPREDNISOLONE SODIUM SUCC 125 MG IJ SOLR
125.0000 mg | Freq: Once | INTRAMUSCULAR | Status: AC
  Administered 2021-03-31: 125 mg via INTRAVENOUS
  Filled 2021-03-31: qty 2

## 2021-03-31 MED ORDER — IOHEXOL 350 MG/ML SOLN
80.0000 mL | Freq: Once | INTRAVENOUS | Status: AC | PRN
  Administered 2021-03-31: 80 mL via INTRAVENOUS

## 2021-03-31 NOTE — Discharge Instructions (Addendum)
Please return immediately if you feel worse.

## 2021-03-31 NOTE — Discharge Instructions (Signed)
Since you declined going to the Hospital via ambulance, please head on there right now, since you need to be placed on oxygen  and need more test to find why you are feeling so ill. We cant do rapid covid or flu tests here.

## 2021-03-31 NOTE — Clinical Social Work Note (Signed)
Transition of Care [facility]) - Emergency Department Mini Assessment   Patient Details  Name: KARMON ANDIS MRN: [id number] Date of Birth: July 18, 1946  Transition of Care [facility]) CM/SW Contact:    Iona Beard, [place] Phone Number: 03/31/2021, 4:40 PM   Clinical Narrative: [facility] consulted for home O2 needs. CSW made referral to Ashly with [employer] who accepted referral and had driver en route to the ED. CSW updated that pt left AMA. CSW updated Ashly who states they will still call pt and inquire about interest in home O2 as orders have been placed. TOC signing off.   ED Mini Assessment: What brought you to the Emergency Department? : SOB  Barriers to Discharge: ED DME delivery  Barrier interventions: Pt left AMA  Means of departure: Car  Interventions which prevented an admission or readmission: DME Provided    Patient Contact and Communications Key Contact 1: [employer]   Spoke with: Ashly Contact Date: 03/31/21,   Contact time: 1637   Call outcome: Referral accepted  Patient states their goals for this hospitalization and ongoing recovery are:: Go home CMS Medicare.gov Compare Post Acute Care list provided to:: Patient Choice offered to / list presented to : Patient  Admission diagnosis:  SOB--SENT FROM URGENT CARE Patient Active Problem List   Diagnosis Date Noted  . Knee pain, bilateral 05/10/2020  . Allergic rhinitis 05/10/2020  . S/P arthroscopy of right knee 10/16/18 10/23/2018  . Meniscus, lateral, derangement, right   . Primary osteoarthritis of right knee   . Back spasm 08/11/2018  . Essential hypertension 06/02/2018  . Obesity (BMI 30.0-34.9) 06/02/2018  . Anemia   . Normocytic anemia, not due to blood loss 07/26/2017  . Vitamin B12 deficiency 07/26/2017  . Shoulder pain, left 05/22/2017  . Tubular adenoma of colon 07/17/2015  . H/O nicotine dependence 07/17/2015  . Unsteady gait 07/05/2014  . Back pain with right-sided radiculopathy 06/07/2014  .  Osteoporosis 04/21/2014  . IGT (impaired glucose tolerance) 09/20/2013  . Adult hypothyroidism 02/02/2011  . Hyperlipidemia 05/03/2010  . SHOULDER PAIN, LEFT 05/03/2010  . Vitamin D deficiency 01/10/2010  . OSTEOARTHRITIS, KNEES, BILATERAL 01/10/2010  . Fatigue 01/10/2010  . Nephrolithiasis, left 01/10/2010  . HAND PAIN, BILATERAL 11/17/2008  . VAGINITIS, ATROPHIC 10/05/2008  . CARPAL TUNNEL SYNDROME, BILATERAL 09/07/2008  . Rheumatoid arthritis ([place]) 09/07/2008  . Low back pain with radiation 09/07/2008   PCP:  Fayrene Helper, MD Pharmacy:   [facility], [place]. [place] [address] Phone: [phone] Fax: [phone]  [place], [place] - [place] [address] [place] [address] [place] [address] Phone: [phone] Fax: [phone]  [place], [address] [place], [address] [place], [place] [place] [address] Phone: [phone] Fax: [phone]

## 2021-03-31 NOTE — ED Triage Notes (Signed)
Pt c/o SOB, productive cough, fever earlier in the week and n/v middle of the week. Pt denies fever and n/v at this time, only SOB and cough.

## 2021-03-31 NOTE — ED Notes (Signed)
Pt to CT

## 2021-03-31 NOTE — ED Notes (Signed)
Patient refused EMS transport to ED.  Charge Nurse at [facility] ED notified of patient coming due to low O2 sats.

## 2021-03-31 NOTE — ED Notes (Signed)
RT in with pt.

## 2021-03-31 NOTE — ED Triage Notes (Signed)
Cough, chills and fever since Monday.  Started coughing up sputum yesterday

## 2021-03-31 NOTE — ED Notes (Signed)
Pt back from CT

## 2021-03-31 NOTE — ED Provider Notes (Signed)
[facility]    CSN: [id number] Arrival date & time: 03/31/21  1105      History   Chief Complaint No chief complaint on file.   HPI Bianca Mcguire is a 75 y.o. female who presents with onset of chills, fever, aches, and cough 5 days ago. Has not been getting any better. Has been SOB. Denies chest pains.     Past Medical History:  Diagnosis Date  . Acute pain of right knee 01/30/2011   Qualifier: Diagnosis of  By: Moshe Cipro MD, Joycelyn Schmid    . Anemia   . Arthritis   . Carpal tunnel syndrome, bilateral   . Diverticulosis dx 2011  . GERD (gastroesophageal reflux disease)   . Headache   . Hypertension 06/02/2018  . Low back pain   . Obesity   . Rheumatoid arthritis(714.0)     Patient Active Problem List   Diagnosis Date Noted  . Knee pain, bilateral 05/10/2020  . Allergic rhinitis 05/10/2020  . S/P arthroscopy of right knee 10/16/18 10/23/2018  . Meniscus, lateral, derangement, right   . Primary osteoarthritis of right knee   . Back spasm 08/11/2018  . Essential hypertension 06/02/2018  . Obesity (BMI 30.0-34.9) 06/02/2018  . Anemia   . Normocytic anemia, not due to blood loss 07/26/2017  . Vitamin B12 deficiency 07/26/2017  . Shoulder pain, left 05/22/2017  . Tubular adenoma of colon 07/17/2015  . H/O nicotine dependence 07/17/2015  . Unsteady gait 07/05/2014  . Back pain with right-sided radiculopathy 06/07/2014  . Osteoporosis 04/21/2014  . IGT (impaired glucose tolerance) 09/20/2013  . Adult hypothyroidism 02/02/2011  . Hyperlipidemia 05/03/2010  . SHOULDER PAIN, LEFT 05/03/2010  . Vitamin D deficiency 01/10/2010  . OSTEOARTHRITIS, KNEES, BILATERAL 01/10/2010  . Fatigue 01/10/2010  . Nephrolithiasis, left 01/10/2010  . HAND PAIN, BILATERAL 11/17/2008  . VAGINITIS, ATROPHIC 10/05/2008  . CARPAL TUNNEL SYNDROME, BILATERAL 09/07/2008  . Rheumatoid arthritis ([place]) 09/07/2008  . Low back pain with radiation 09/07/2008    Past Surgical History:   Procedure Laterality Date  . bilateral foot surgery    . COLONOSCOPY  2011   Dr. Oneida Alar: 72mm sessile polyp (tubular adenoma), pancolonic diverticulosis  . COLONOSCOPY N/A 09/26/2017   Procedure: COLONOSCOPY;  Surgeon: Danie Binder, MD;  [facility]: [facility];  Service: Endoscopy;  Laterality: N/A;  7:30am  . ESOPHAGOGASTRODUODENOSCOPY N/A 09/16/2017   Procedure: ESOPHAGOGASTRODUODENOSCOPY (EGD);  Surgeon: Danie Binder, MD;  Location: [facility];  Service: Endoscopy;  Laterality: N/A;  . FLEXIBLE SIGMOIDOSCOPY N/A 09/16/2017   Procedure: FLEXIBLE SIGMOIDOSCOPY;  Surgeon: Danie Binder, MD;  Location: [facility];  Service: Endoscopy;  Laterality: N/A;  . [place]  . KNEE ARTHROSCOPY WITH LATERAL MENISECTOMY Right 10/16/2018   Procedure: KNEE ARTHROSCOPY WITH LATERAL MENISCECTOMY;  Surgeon: Carole Civil, MD;  Location: [facility];  Service: Orthopedics;  Laterality: Right;  . OOPHORECTOMY Right   . roophorectomy for benign disease  March 2011   Dr. Glo Herring  . tonsillectomy and adenoidctomy in childhood    . TUBAL LIGATION  1977    OB History   No obstetric history on file.      Home Medications    Prior to Admission medications   Medication Sig Start Date End Date Taking? Authorizing Provider  alendronate (FOSAMAX) 70 MG tablet TAKE 1 TABLET BY MOUTH  EVERY 7 DAYS WITH A FULL  GLASS OF WATER ON AN EMPTY  STOMACH 08/26/20   Fayrene Helper, MD  amLODipine (NORVASC) 5 MG tablet TAKE 1 TABLET BY MOUTH  DAILY 07/18/20   Fayrene Helper, MD  Calcium Carb-Cholecalciferol (CALCIUM 600 + D PO) Take 1 tablet by mouth every evening.     [provider]  cyclobenzaprine (FLEXERIL) 5 MG tablet TAKE 1 TABLET BY MOUTH  TWICE WEEKLY AS NEEDED, FOR BACK SPASM 01/18/21   Fayrene Helper, MD  famotidine (PEPCID) 40 MG tablet Take 1 tablet (40 mg total) by mouth daily. 11/09/19   Fayrene Helper, MD  fluticasone (FLONASE) 50 MCG/ACT nasal spray  Place 2 sprays into both nostrils daily. 05/10/20   Fayrene Helper, MD  folic acid (FOLVITE) [id number] MCG tablet Take 400 mcg by mouth every evening.     [provider]  gabapentin (NEURONTIN) 100 MG capsule TAKE 1 CAPSULE(100 MG) BY MOUTH THREE TIMES DAILY 03/17/21   Carole Civil, MD  ibuprofen (ADVIL) 800 MG tablet TAKE 1 TABLET(800 MG) BY MOUTH EVERY 8 HOURS AS NEEDED 02/20/21   Carole Civil, MD  IRON-B12-VITAMINS PO Take 500 mcg by mouth every evening.     [provider]  levothyroxine (SYNTHROID) 50 MCG tablet TAKE 1 TABLET BY MOUTH  DAILY FROM MONDAY THROUGH  THURSDAY , THEN TAKE ONE  AND A HALF TABLETS DAILY ON FRIDAY SATURDAY AND SUNDAY 11/21/20   Fayrene Helper, MD  montelukast (SINGULAIR) 10 MG tablet TAKE 1 TABLET BY MOUTH AT  BEDTIME 03/27/20   Fayrene Helper, MD  Multiple Vitamins-Iron (MULTIVITAMINS WITH IRON) TABS tablet Take 1 tablet by mouth every evening.    [provider]  neomycin-polymyxin-hydrocortisone (CORTISPORIN) OTIC solution Apply 1 to 2 drops to toe BID after soaking 02/26/19   Wallene Huh, DPM  omeprazole (PRILOSEC) 20 MG capsule TAKE 1 CAPSULE BY MOUTH  DAILY 1/2 HOUR PRIOR TO  BREAKFAST 03/27/20   Fayrene Helper, MD  Vitamin D, Ergocalciferol, (DRISDOL) 1.25 MG (50000 UNIT) CAPS capsule Take 50,000 Units by mouth once a week. 05/17/20   [provider]    Family History Family History  Problem Relation Age of Onset  . Heart failure Mother   . Cancer Mother 69       oral  . Heart failure Father   . Arthritis Sister   . Heart attack Brother   . Thyroid disease Daughter   . Hypertension Daughter   . Hypertension Daughter   . Thyroid disease Daughter   . Heart failure Sister   . Diabetes Sister   . Hypertension Sister   . Leukemia Brother   . Colon cancer Neg Hx   . Colon polyps Neg Hx     Social History Social History   Tobacco Use  . Smoking status: Former Smoker    Packs/day: 2.00     Years: 25.00    Pack years: 50.00    Types: Cigarettes    Quit date: 05/12/2014    Years since quitting: 6.8  . Smokeless tobacco: Never Used  Vaping Use  . Vaping Use: Never used  Substance Use Topics  . Alcohol use: No  . Drug use: No     Allergies   Patient has no known allergies.   Review of Systems Review of Systems  Constitutional: Positive for chills and fever.  HENT: Positive for congestion, postnasal drip and rhinorrhea. Negative for ear pain, facial swelling and sore throat.   Respiratory: Positive for cough and shortness of breath. Negative for chest tightness.   Cardiovascular: Negative for  chest pain.  Gastrointestinal: Negative for abdominal pain.  Musculoskeletal: Positive for myalgias.  Neurological: Negative for headaches.     Physical Exam Triage Vital Signs ED Triage Vitals [03/31/21 1113]  Enc Vitals Group     BP 127/69     Pulse Rate 90     Resp 18     Temp 98.5 F (36.9 C)     Temp Source Oral     SpO2 (!) 89 %     Weight      Height      Head Circumference      Peak Flow      Pain Score 0     Pain Loc      Pain Edu?      Excl. in [place]?    No data found.  Updated Vital Signs BP 127/69 (BP Location: Right Arm)   Pulse 90   Temp 98.5 F (36.9 C) (Oral)   Resp 18   SpO2 92%   Visual Acuity Right Eye Distance:   Left Eye Distance:   Bilateral Distance:    Right Eye Near:   Left Eye Near:    Bilateral Near:       Physical Exam Constitutional:   When pt first arrived her O2 at at 88%    General: she seems SOB     Appearance: He is not toxic-appearing.  HENT:     Head: Normocephalic.     Right Ear: Tympanic membrane, ear canal and external ear normal.     Left Ear: Ear canal and external ear normal.      Eyes:     General: No scleral icterus.    Conjunctiva/sclera: Conjunctivae normal.  Cardiovascular:     Rate and Rhythm: Normal rate and regular rhythm.     Heart sounds: No murmur heard.   Pulmonary: She is  tachypnic       Breath sounds: with mild Wheezing on upper lungs present and very decreased breath sounds on bases   Musculoskeletal:        General: Normal range of motion.     Cervical back: Neck supple.  Lymphadenopathy:     Cervical: No cervical adenopathy.   Neurological:     Mental Status: SHe is alert and oriented to person, place, and time.     Gait: Gait normal.  Psychiatric:        Mood and Affect: Mood normal.        Behavior: Behavior normal.        Thought Content: Thought content normal.        Judgment: Judgment normal.     UC Treatments / Results  Labs (all labs ordered are listed, but only abnormal results are displayed) Labs Reviewed - No data to display  EKG   Radiology No results found.  Procedures Procedures (including critical care time)  Medications Ordered in UC Medications - No data to display  Initial Impression / Assessment and Plan / UC Course  I have reviewed the triage vital signs and the nursing notes. Has hypoxemia with respiratory  illness, could be covid, flu or pneumonia.  She was placed on 2 L O2 up on arrival and her pulse ox rose to 94% within 10 minutes. She was sent to the hospital but she refused to go by EMS and wanted to drive herself.  Final Clinical Impressions(s) / UC Diagnoses   Final diagnoses:  Hypoxemia  Flu-like symptoms     Discharge Instructions  Since you declined going to the Hospital via ambulance, please head on there right now, since you need to be placed on oxygen  and need more test to find why you are feeling so ill. We cant do rapid covid or flu tests here.     ED Prescriptions    None     PDMP not reviewed this encounter.   Shelby Mattocks, Hershal Coria 03/31/21 1134

## 2021-03-31 NOTE — ED Notes (Signed)
@   14.43 assisted pt to bathroom tolerated well

## 2021-03-31 NOTE — ED Provider Notes (Signed)
[facility] EMERGENCY DEPARTMENT Provider Note   CSN: [id number] Arrival date & time: 03/31/21  1144     History Chief Complaint  Patient presents with  . Shortness of Breath    Bianca Mcguire is a 75 y.o. female.  Pt presents to the ED today with sob, cough, fever since Monday, April 18th.  Pt said she does not feel like she is getting any better.  She has not taken any tylenol or ibuprofen pta.  Pt initially went to UC for her sx.  She was sent here after finding her O2 sat to be 89.  They recommended EMS, but she wanted to drive here.  Pt has been vaccinated against Covid, but has not had the booster.  No known sick contacts.        Past Medical History:  Diagnosis Date  . Acute pain of right knee 01/30/2011   Qualifier: Diagnosis of  By: Lodema Hong MD, Claris Che    . Anemia   . Arthritis   . Carpal tunnel syndrome, bilateral   . Diverticulosis dx 2011  . GERD (gastroesophageal reflux disease)   . Headache   . Hypertension 06/02/2018  . Low back pain   . Obesity   . Rheumatoid arthritis(714.0)     Patient Active Problem List   Diagnosis Date Noted  . Knee pain, bilateral 05/10/2020  . Allergic rhinitis 05/10/2020  . S/P arthroscopy of right knee 10/16/18 10/23/2018  . Meniscus, lateral, derangement, right   . Primary osteoarthritis of right knee   . Back spasm 08/11/2018  . Essential hypertension 06/02/2018  . Obesity (BMI 30.0-34.9) 06/02/2018  . Anemia   . Normocytic anemia, not due to blood loss 07/26/2017  . Vitamin B12 deficiency 07/26/2017  . Shoulder pain, left 05/22/2017  . Tubular adenoma of colon 07/17/2015  . H/O nicotine dependence 07/17/2015  . Unsteady gait 07/05/2014  . Back pain with right-sided radiculopathy 06/07/2014  . Osteoporosis 04/21/2014  . IGT (impaired glucose tolerance) 09/20/2013  . Adult hypothyroidism 02/02/2011  . Hyperlipidemia 05/03/2010  . SHOULDER PAIN, LEFT 05/03/2010  . Vitamin D deficiency 01/10/2010  .  OSTEOARTHRITIS, KNEES, BILATERAL 01/10/2010  . Fatigue 01/10/2010  . Nephrolithiasis, left 01/10/2010  . HAND PAIN, BILATERAL 11/17/2008  . VAGINITIS, ATROPHIC 10/05/2008  . CARPAL TUNNEL SYNDROME, BILATERAL 09/07/2008  . Rheumatoid arthritis ([facility]) 09/07/2008  . Low back pain with radiation 09/07/2008    Past Surgical History:  Procedure Laterality Date  . bilateral foot surgery    . COLONOSCOPY  2011   Dr. Darrick Penna: 28mm sessile polyp (tubular adenoma), pancolonic diverticulosis  . COLONOSCOPY N/A 09/26/2017   Procedure: COLONOSCOPY;  Surgeon: West Bali, MD;  [facility];  Service: Endoscopy;  Laterality: N/A;  7:30am  . ESOPHAGOGASTRODUODENOSCOPY N/A 09/16/2017   Procedure: ESOPHAGOGASTRODUODENOSCOPY (EGD);  Surgeon: West Bali, MD;  Location: [facility];  Service: Endoscopy;  Laterality: N/A;  . FLEXIBLE SIGMOIDOSCOPY N/A 09/16/2017   Procedure: FLEXIBLE SIGMOIDOSCOPY;  Surgeon: West Bali, MD;  Location: [facility];  Service: Endoscopy;  Laterality: N/A;  . HEMORRHOID SURGERY  1980  . KNEE ARTHROSCOPY WITH LATERAL MENISECTOMY Right 10/16/2018   Procedure: KNEE ARTHROSCOPY WITH LATERAL MENISCECTOMY;  Surgeon: Vickki Hearing, MD;  Location: [facility];  Service: Orthopedics;  Laterality: Right;  . OOPHORECTOMY Right   . roophorectomy for benign disease  March 2011   Dr. Emelda Fear  . tonsillectomy and adenoidctomy in childhood    . TUBAL LIGATION  1977  OB History   No obstetric history on file.     Family History  Problem Relation Age of Onset  . Heart failure Mother   . Cancer Mother 52       oral  . Heart failure Father   . Arthritis Sister   . Heart attack Brother   . Thyroid disease Daughter   . Hypertension Daughter   . Hypertension Daughter   . Thyroid disease Daughter   . Heart failure Sister   . Diabetes Sister   . Hypertension Sister   . Leukemia Brother   . Colon cancer Neg Hx   . Colon polyps Neg Hx     Social History    Tobacco Use  . Smoking status: Former Smoker    Packs/day: 2.00    Years: 25.00    Pack years: 50.00    Types: Cigarettes    Quit date: 05/12/2014    Years since quitting: 6.8  . Smokeless tobacco: Never Used  Vaping Use  . Vaping Use: Never used  Substance Use Topics  . Alcohol use: No  . Drug use: No    Home Medications Prior to Admission medications   Medication Sig Start Date End Date Taking? Authorizing Provider  doxycycline (VIBRAMYCIN) 100 MG capsule Take 1 capsule (100 mg total) by mouth 2 (two) times daily. 03/31/21  Yes Isla Pence, MD  methylPREDNISolone (MEDROL DOSEPAK) 4 MG TBPK tablet Take by mouth daily. Day 1:  2 pills at breakfast, 1 pill at lunch, 1 pill after supper, 2 pills at bedtime;Day 2:  1 pill at breakfast, 1 pill at lunch, 1 pill after supper, 2 pills at bedtime;Day 3:  1 pill at breakfast, 1 pill at lunch, 1 pill after supper, 1 pill at bedtime;Day 4:  1 pill at breakfast, 1 pill at lunch, 1 pill at bedtime;Day 5:  1 pill at breakfast, 1 pill at bedtime;Day 6:  1 pill at breakfast 03/31/21  Yes Isla Pence, MD  alendronate (FOSAMAX) 70 MG tablet TAKE 1 TABLET BY MOUTH  EVERY 7 DAYS WITH A FULL  GLASS OF WATER ON AN EMPTY  STOMACH 08/26/20   Fayrene Helper, MD  amLODipine (NORVASC) 5 MG tablet TAKE 1 TABLET BY MOUTH  DAILY 07/18/20   Fayrene Helper, MD  Calcium Carb-Cholecalciferol (CALCIUM 600 + D PO) Take 1 tablet by mouth every evening.     [provider]  cyclobenzaprine (FLEXERIL) 5 MG tablet TAKE 1 TABLET BY MOUTH  TWICE WEEKLY AS NEEDED, FOR BACK SPASM 01/18/21   Fayrene Helper, MD  famotidine (PEPCID) 40 MG tablet Take 1 tablet (40 mg total) by mouth daily. 11/09/19   Fayrene Helper, MD  fluticasone (FLONASE) 50 MCG/ACT nasal spray Place 2 sprays into both nostrils daily. 05/10/20   Fayrene Helper, MD  folic acid (FOLVITE) 161 MCG tablet Take 400 mcg by mouth every evening.     [provider]  gabapentin  (NEURONTIN) 100 MG capsule TAKE 1 CAPSULE(100 MG) BY MOUTH THREE TIMES DAILY 03/17/21   Carole Civil, MD  ibuprofen (ADVIL) 800 MG tablet TAKE 1 TABLET(800 MG) BY MOUTH EVERY 8 HOURS AS NEEDED 02/20/21   Carole Civil, MD  IRON-B12-VITAMINS PO Take 500 mcg by mouth every evening.     [provider]  levothyroxine (SYNTHROID) 50 MCG tablet TAKE 1 TABLET BY MOUTH  DAILY FROM MONDAY THROUGH  THURSDAY , THEN TAKE ONE  AND A HALF TABLETS DAILY ON FRIDAY SATURDAY  AND SUNDAY 11/21/20   Fayrene Helper, MD  montelukast (SINGULAIR) 10 MG tablet TAKE 1 TABLET BY MOUTH AT  BEDTIME 03/27/20   Fayrene Helper, MD  Multiple Vitamins-Iron (MULTIVITAMINS WITH IRON) TABS tablet Take 1 tablet by mouth every evening.    [provider]  neomycin-polymyxin-hydrocortisone (CORTISPORIN) OTIC solution Apply 1 to 2 drops to toe BID after soaking 02/26/19   Wallene Huh, DPM  omeprazole (PRILOSEC) 20 MG capsule TAKE 1 CAPSULE BY MOUTH  DAILY 1/2 HOUR PRIOR TO  BREAKFAST 03/27/20   Fayrene Helper, MD  Vitamin D, Ergocalciferol, (DRISDOL) 1.25 MG (50000 UNIT) CAPS capsule Take 50,000 Units by mouth once a week. 05/17/20   [provider]    Allergies    Patient has no known allergies.  Review of Systems   Review of Systems  Constitutional: Positive for fever.  Respiratory: Positive for cough and shortness of breath.   All other systems reviewed and are negative.   Physical Exam Updated Vital Signs BP (!) 172/73   Pulse (!) 41   Temp 98 F (36.7 C) (Oral)   Resp (!) 24   Ht 5\' 2"  (1.575 m)   Wt 76.7 kg   SpO2 90%   BMI 30.91 kg/m   Physical Exam Vitals and nursing note reviewed.  Constitutional:      Appearance: She is well-developed.  HENT:     Head: Normocephalic and atraumatic.     Mouth/Throat:     Mouth: Mucous membranes are moist.     Pharynx: Oropharynx is clear.  Eyes:     Extraocular Movements: Extraocular movements intact.     Pupils: Pupils  are equal, round, and reactive to light.  Cardiovascular:     Rate and Rhythm: Normal rate and regular rhythm.  Pulmonary:     Effort: Pulmonary effort is normal.     Breath sounds: Wheezing present.  Abdominal:     General: Bowel sounds are normal.     Palpations: Abdomen is soft.  Musculoskeletal:        General: Normal range of motion.     Cervical back: Normal range of motion and neck supple.  Skin:    General: Skin is warm.     Capillary Refill: Capillary refill takes less than 2 seconds.  Neurological:     General: No focal deficit present.     Mental Status: She is alert and oriented to person, place, and time.  Psychiatric:        Mood and Affect: Mood normal.        Behavior: Behavior normal.     ED Results / Procedures / Treatments   Labs (all labs ordered are listed, but only abnormal results are displayed) Labs Reviewed  LACTIC ACID, PLASMA - Abnormal; Notable for the following components:      Result Value   Lactic Acid, Venous 2.2 (*)    All other components within normal limits  CBC WITH DIFFERENTIAL/PLATELET - Abnormal; Notable for the following components:   WBC 12.1 (*)    Hemoglobin 11.9 (*)    Neutro Abs 8.8 (*)    All other components within normal limits  COMPREHENSIVE METABOLIC PANEL - Abnormal; Notable for the following components:   Potassium 3.2 (*)    Glucose, Bld 165 (*)    Calcium 8.7 (*)    Albumin 3.4 (*)    All other components within normal limits  D-DIMER, QUANTITATIVE - Abnormal; Notable for the following components:  D-Dimer, Quant 2.04 (*)    All other components within normal limits  FIBRINOGEN - Abnormal; Notable for the following components:   Fibrinogen 692 (*)    All other components within normal limits  C-REACTIVE PROTEIN - Abnormal; Notable for the following components:   CRP 35.0 (*)    All other components within normal limits  RESP PANEL BY RT-PCR (FLU A&B, COVID) ARPGX2  CULTURE, BLOOD (ROUTINE X 2)  CULTURE,  BLOOD (ROUTINE X 2)  LACTIC ACID, PLASMA  PROCALCITONIN  LACTATE DEHYDROGENASE  FERRITIN  TRIGLYCERIDES    EKG EKG Interpretation  Date/Time:  Friday March 31 2021 12:10:02 EDT Ventricular Rate:  80 PR Interval:  138 QRS Duration: 92 QT Interval:  406 QTC Calculation: 469 R Axis:   69 Text Interpretation: Age not entered, assumed to be  75 years old for purpose of ECG interpretation Sinus rhythm Probable left atrial enlargement No significant change since last tracing Confirmed by Isla Pence [phone]) on 03/31/2021 12:15:29 PM   Radiology CT Angio Chest PE W and/or Wo Contrast  Result Date: 03/31/2021 CLINICAL DATA:  Cough, fever, shortness of breath EXAM: CT ANGIOGRAPHY CHEST WITH CONTRAST TECHNIQUE: Multidetector CT imaging of the chest was performed using the standard protocol during bolus administration of intravenous contrast. Multiplanar CT image reconstructions and MIPs were obtained to evaluate the vascular anatomy. CONTRAST:  97mL OMNIPAQUE IOHEXOL 350 MG/ML SOLN COMPARISON:  Chest CT 08/01/2017 FINDINGS: Cardiovascular: Satisfactory opacification of the pulmonary arteries to the segmental level. No evidence of pulmonary embolism. Thoracic aorta is nonaneurysmal. Normal heart size. No pericardial effusion. Mediastinum/Nodes: Mildly enlarged 1.1 cm right paratracheal node (series 4, image 26). Mildly prominent bilateral hilar lymph nodes, although not pathologically enlarged by size criteria. No axillary lymphadenopathy. No thyroid abnormality identified. Trachea and esophagus within normal limits. Lungs/Pleura: Evaluation is slightly degraded by respiratory motion. Scattered tree-in-bud nodularity bilaterally with more confluent areas of ground-glass opacity most pronounced within the periphery of the right lower lobe. There are additional areas of focal opacity within the anterior left upper lobe, periphery of the right upper lobe, as well as within the peripheral aspect of the  left lower lobe. No pleural effusion. No pneumothorax. Upper Abdomen: No acute abnormality. Musculoskeletal: No chest wall abnormality. No acute or significant osseous findings. Review of the MIP images confirms the above findings. IMPRESSION: 1. No evidence of pulmonary embolism. 2. Scattered tree-in-bud nodularity bilaterally with more confluent areas of consolidation and ground-glass opacity most pronounced within the periphery of the right lower lobe. Findings are most suggestive of an infectious or inflammatory process, including atypical viral infection such as COVID-19. 3. Mildly enlarged right paratracheal node, likely reactive. Electronically Signed   By: Davina Poke D.O.   On: 03/31/2021 14:59   DG Chest Port 1 View  Result Date: 03/31/2021 CLINICAL DATA:  Shortness of breath. EXAM: PORTABLE CHEST 1 VIEW COMPARISON:  January 12, 2010. FINDINGS: The heart size and mediastinal contours are within normal limits. Both lungs are clear. No pneumothorax or pleural effusion is noted. The visualized skeletal structures are unremarkable. IMPRESSION: No active disease. Electronically Signed   By: Marijo Conception M.D.   On: 03/31/2021 12:52    Procedures Procedures   Medications Ordered in ED Medications  0.9 %  sodium chloride infusion (0 mLs Intravenous Stopped 03/31/21 1554)  doxycycline (VIBRA-TABS) tablet 100 mg (has no administration in time range)  methylPREDNISolone sodium succinate (SOLU-MEDROL) 125 mg/2 mL injection 125 mg (125 mg Intravenous Given 03/31/21 1436)  ipratropium-albuterol (DUONEB) 0.5-2.5 (3) MG/3ML nebulizer solution 3 mL (3 mLs Nebulization Given 03/31/21 1449)  iohexol (OMNIPAQUE) 350 MG/ML injection 80 mL (80 mLs Intravenous Contrast Given 03/31/21 1421)    ED Course  I have reviewed the triage vital signs and the nursing notes.  Pertinent labs & imaging results that were available during my care of the patient were reviewed by me and considered in my medical decision  making (see chart for details).    MDM Rules/Calculators/A&P                          Pt is feeling better, but her O2 sats still drop with ambulation.  She walked to the bathroom and O2 sat dropped to 87%.  Pt kept on 2L.  Covid neg, but CT scan looked like an atypical infection.  I wanted to admit patient, but she refuses.  She said she has a daughter who is an invalid at home.  She needs to go home to care for her.  I spoke with SW/CM and she is unable to set pt up with home oxygen tonight, but will try to get it by tomorrow.  Face to face form filled out.  Pt will sign AMA.  She is told she could get worse or die.  She knows that she is welcome to return if worse.  She is to f/u with pcp.  Final Clinical Impression(s) / ED Diagnoses Final diagnoses:  COPD exacerbation ([place])  Upper respiratory tract infection, unspecified type  Hypoxia    Rx / DC Orders ED Discharge Orders         Ordered    methylPREDNISolone (MEDROL DOSEPAK) 4 MG TBPK tablet  Daily        03/31/21 1555    doxycycline (VIBRAMYCIN) 100 MG capsule  2 times daily        03/31/21 1555           Isla Pence, MD 03/31/21 1558

## 2021-04-04 ENCOUNTER — Telehealth: Payer: Self-pay

## 2021-04-04 NOTE — Telephone Encounter (Signed)
Ashly wants to talk to you regarding pt

## 2021-04-05 ENCOUNTER — Other Ambulatory Visit: Payer: Self-pay | Admitting: Family Medicine

## 2021-04-05 ENCOUNTER — Telehealth: Payer: Self-pay

## 2021-04-05 LAB — CULTURE, BLOOD (ROUTINE X 2)
Culture: NO GROWTH
Culture: NO GROWTH

## 2021-04-05 MED ORDER — IPRATROPIUM-ALBUTEROL 0.5-2.5 (3) MG/3ML IN SOLN: 3.0000 mL | Freq: Four times a day (QID) | RESPIRATORY_TRACT | 0 refills | Status: DC | PRN

## 2021-04-05 MED ORDER — BUDESONIDE-FORMOTEROL FUMARATE 160-4.5 MCG/ACT IN AERO: 2.0000 | INHALATION_SPRAY | Freq: Two times a day (BID) | RESPIRATORY_TRACT | 5 refills | Status: DC

## 2021-04-05 NOTE — Telephone Encounter (Signed)
Ashly with [facility] has been speaking with pt since she left the ER for copd exacerbation. He was wanting to know if we could order Duonebs and Budesonide for pt at home to be ordered through [facility] mail services so it is little cost to pt. She voiced to him that she has had trouble in the past not being able to afford inhalers prescribed. Please advise.

## 2021-04-05 NOTE — Telephone Encounter (Signed)
Printing scripts for you to send to [facility] , she needs ov also, thanks

## 2021-04-05 NOTE — Telephone Encounter (Signed)
See other note

## 2021-04-05 NOTE — Progress Notes (Unsigned)
duoneb

## 2021-04-10 ENCOUNTER — Other Ambulatory Visit: Payer: Self-pay

## 2021-04-10 MED ORDER — BUDESONIDE-FORMOTEROL FUMARATE 160-4.5 MCG/ACT IN AERO: 2.0000 | INHALATION_SPRAY | Freq: Two times a day (BID) | RESPIRATORY_TRACT | 5 refills | Status: DC

## 2021-04-10 MED ORDER — IPRATROPIUM-ALBUTEROL 0.5-2.5 (3) MG/3ML IN SOLN: 3.0000 mL | Freq: Four times a day (QID) | RESPIRATORY_TRACT | 0 refills | Status: DC | PRN

## 2021-04-10 NOTE — Telephone Encounter (Signed)
Rx's have been faxed. Left message for pt.

## 2021-04-11 ENCOUNTER — Telehealth: Payer: Self-pay

## 2021-04-11 ENCOUNTER — Encounter: Payer: Self-pay | Admitting: Family Medicine

## 2021-04-11 ENCOUNTER — Other Ambulatory Visit: Payer: Self-pay

## 2021-04-11 ENCOUNTER — Ambulatory Visit (INDEPENDENT_AMBULATORY_CARE_PROVIDER_SITE_OTHER): Payer: Medicare Other | Admitting: Family Medicine

## 2021-04-11 VITALS — BP 133/72 | HR 68 | Resp 14 | Ht 62.0 in | Wt 146.0 lb

## 2021-04-11 DIAGNOSIS — J440 Chronic obstructive pulmonary disease with acute lower respiratory infection: Secondary | ICD-10-CM | POA: Diagnosis not present

## 2021-04-11 DIAGNOSIS — J209 Acute bronchitis, unspecified: Secondary | ICD-10-CM | POA: Insufficient documentation

## 2021-04-11 DIAGNOSIS — R2681 Unsteadiness on feet: Secondary | ICD-10-CM

## 2021-04-11 DIAGNOSIS — E559 Vitamin D deficiency, unspecified: Secondary | ICD-10-CM

## 2021-04-11 DIAGNOSIS — R0602 Shortness of breath: Secondary | ICD-10-CM

## 2021-04-11 DIAGNOSIS — J449 Chronic obstructive pulmonary disease, unspecified: Secondary | ICD-10-CM

## 2021-04-11 DIAGNOSIS — R7302 Impaired glucose tolerance (oral): Secondary | ICD-10-CM | POA: Diagnosis not present

## 2021-04-11 DIAGNOSIS — R059 Cough, unspecified: Secondary | ICD-10-CM | POA: Diagnosis not present

## 2021-04-11 DIAGNOSIS — Z87891 Personal history of nicotine dependence: Secondary | ICD-10-CM | POA: Diagnosis not present

## 2021-04-11 DIAGNOSIS — E039 Hypothyroidism, unspecified: Secondary | ICD-10-CM

## 2021-04-11 DIAGNOSIS — Z1231 Encounter for screening mammogram for malignant neoplasm of breast: Secondary | ICD-10-CM | POA: Diagnosis not present

## 2021-04-11 DIAGNOSIS — I10 Essential (primary) hypertension: Secondary | ICD-10-CM

## 2021-04-11 MED ORDER — UNABLE TO FIND: 0 refills | Status: AC

## 2021-04-11 MED ORDER — GABAPENTIN 100 MG PO CAPS: 100.0000 mg | ORAL_CAPSULE | Freq: Every day | ORAL | 1 refills | Status: DC

## 2021-04-11 NOTE — Patient Instructions (Addendum)
Annual physical exam with MD in early September , call o if you need me before  Please get Covid booster  Please refer for lung cancer screening chest scan end June, or early July, and schedule  You do NOT need oxygen at home based on today's exam, after you walked , your level was 93 % which is good  Nurse , please follow up and prescribe a breathing machine for the patient and ensure she has the neb solution prescribed last week that was requested by home health ( see tele messages in pt's chart for clarification)  Labs today, TSH, vit D and chem 7 and eGFR  Please schedule mammogram at checkout  

## 2021-04-11 NOTE — Assessment & Plan Note (Signed)
Needs screening chest scan in approx 2 months, recent scan at ED for hypoxia was abnormal

## 2021-04-11 NOTE — Assessment & Plan Note (Signed)
Home safety reviewed 

## 2021-04-11 NOTE — Progress Notes (Addendum)
   Bianca Mcguire     MRN: [id number]      DOB: November 12, 1946   HPI Bianca Mcguire is here for follow up of recent ED visit on 03/31/2021, when she was hypoxic , febrile and coughing. Feels much better after prednisone does pack and is now coughing up white sputu. No recent fever or chills Has oxygen but is anxious to get rid of it if not needed ROS  Denies sinus pressure, nasal congestion, ear pain or sore throat. . Denies chest pains, palpitations and leg swelling Denies abdominal pain, nausea, vomiting,diarrhea or constipation.   Denies dysuria, frequency, hesitancy or incontinence. C/o  joint pain,  and limitation in mobility. Denies headaches, seizures, numbness, or tingling. Denies depression, anxiety or insomnia. Denies skin break down or rash.   PE BP 133/72   Pulse 68   Resp 14   Ht 5\' 2"  (1.575 m)   Wt 146 lb (66.2 kg)   SpO2 92%   BMI 26.70 kg/m   Oxygen after ambulation on room air is 93%   Patient alert and oriented and in no cardiopulmonary distress.  HEENT: No facial asymmetry, EOMI,     Neck supple .  Chest: decreased air entry , scattered wheeziest , no crackles  CVS: S1, S2 no murmurs, no S3.Regular rate.  ABD: Soft non tender.   Ext: No edema  MS: decreased  ROM spine, shoulders, hips and knees.  Skin: Intact, no ulcerations or rash noted.  Psych: Good eye contact, normal affect. Memory intact not anxious or depressed appearing.  CNS: CN 2-12 intact, power,  normal throughout.no focal deficits noted.   Assessment & Plan H/O nicotine dependence Needs screening chest scan in approx 2 months, recent scan at ED for hypoxia was abnormal  Essential hypertension Controlled, no change in medication DASH diet and commitment to daily physical activity for a minimum of 30 minutes discussed and encouraged, as a part of hypertension management. The importance of attaining a healthy weight is also discussed.  BP/Weight 04/11/2021 03/31/2021 03/31/2021 07/22/2020  05/10/2020 7/74/1287 07/16/7671  Systolic BP 094 709 628 366 294 765 465  Diastolic BP 72 57 69 80 80 79 68  Wt. (Lbs) 146 169 - 165 165 164 167.8  BMI 26.7 30.91 - 30.18 30.18 30 30.69       Acute bronchitis with COPD (HCC) Improved, needs neb machine and breathing treatments at home No need for home oxygen  Unsteady gait Home safety reviewed  COPD (chronic obstructive pulmonary disease) (HCC) Currently back to baseline following recent exacerbations requiring ED management  Needs nebulizer at home with duoneb as needed, and daily performist and budesonide to improve level of function and safety

## 2021-04-11 NOTE — Assessment & Plan Note (Signed)
Improved, needs neb machine and breathing treatments at home No need for home oxygen

## 2021-04-11 NOTE — Telephone Encounter (Signed)
Tiffany from [facility] called can you return her call.

## 2021-04-11 NOTE — Telephone Encounter (Signed)
error 

## 2021-04-11 NOTE — Assessment & Plan Note (Signed)
Controlled, no change in medication DASH diet and commitment to daily physical activity for a minimum of 30 minutes discussed and encouraged, as a part of hypertension management. The importance of attaining a healthy weight is also discussed.  BP/Weight 04/11/2021 03/31/2021 03/31/2021 07/22/2020 05/10/2020 06/12/8888 12/15/9448  Systolic BP 388 828 003 491 791 505 697  Diastolic BP 72 57 69 80 80 79 68  Wt. (Lbs) 146 169 - 165 165 164 167.8  BMI 26.7 30.91 - 30.18 30.18 30 30.69

## 2021-04-11 NOTE — Telephone Encounter (Signed)
Pt informed. Here today for appt.

## 2021-04-12 ENCOUNTER — Telehealth: Payer: Self-pay

## 2021-04-12 DIAGNOSIS — J449 Chronic obstructive pulmonary disease, unspecified: Secondary | ICD-10-CM | POA: Insufficient documentation

## 2021-04-12 LAB — BASIC METABOLIC PANEL
BUN/Creatinine Ratio: 23 (ref 12–28)
BUN: 22 mg/dL (ref 8–27)
CO2: 23 mmol/L (ref 20–29)
Calcium: 9.1 mg/dL (ref 8.7–10.3)
Chloride: 102 mmol/L (ref 96–106)
Creatinine, Ser: 0.95 mg/dL (ref 0.57–1.00)
Glucose: 100 mg/dL — ABNORMAL HIGH (ref 65–99)
Potassium: 4.6 mmol/L (ref 3.5–5.2)
Sodium: 138 mmol/L (ref 134–144)
eGFR: 63 mL/min/{1.73_m2} (ref >59)

## 2021-04-12 LAB — VITAMIN D 25 HYDROXY (VIT D DEFICIENCY, FRACTURES): Vit D, 25-Hydroxy: 54.9 ng/mL (ref 30.0–100.0)

## 2021-04-12 LAB — TSH: TSH: 1.72 u[IU]/mL (ref 0.450–4.500)

## 2021-04-12 NOTE — Addendum Note (Signed)
Addended by: Tula Nakayama E on: 04/12/2021 11:57 AM   Modules accepted: Orders

## 2021-04-12 NOTE — Telephone Encounter (Signed)
Faxed dc order

## 2021-04-12 NOTE — Telephone Encounter (Signed)
On hold for 5 minutes, will call back.

## 2021-04-12 NOTE — Telephone Encounter (Signed)
[facility] fax # [phone]

## 2021-04-12 NOTE — Assessment & Plan Note (Signed)
Currently back to baseline following recent exacerbations requiring ED management  Needs nebulizer at home with duoneb as needed, and daily performist and budesonide to improve level of function and safety

## 2021-04-12 NOTE — Telephone Encounter (Signed)
Patient called said she needs a form to stop her oxygen so the DME company can pick up her oxygen.

## 2021-04-17 ENCOUNTER — Other Ambulatory Visit: Payer: Self-pay | Admitting: Family Medicine

## 2021-04-17 NOTE — Telephone Encounter (Signed)
Needed D/C order. See other notes. This has been done.

## 2021-05-01 ENCOUNTER — Ambulatory Visit (HOSPITAL_COMMUNITY)
Admission: RE | Admit: 2021-05-01 | Discharge: 2021-05-01 | Disposition: A | Discharge status: Home / Self Care | Payer: Medicare Other | Source: Ambulatory Visit | Attending: Family Medicine | Admitting: Family Medicine

## 2021-05-01 ENCOUNTER — Other Ambulatory Visit: Payer: Self-pay

## 2021-05-01 DIAGNOSIS — Z1231 Encounter for screening mammogram for malignant neoplasm of breast: Secondary | ICD-10-CM | POA: Insufficient documentation

## 2021-05-09 ENCOUNTER — Telehealth: Payer: Self-pay

## 2021-05-09 NOTE — Telephone Encounter (Signed)
Please also advise pt of the mammogram results

## 2021-05-09 NOTE — Telephone Encounter (Signed)
Please send [facility] in to the mail order

## 2021-05-09 NOTE — Telephone Encounter (Signed)
Pt is calling she states that the Oxygen supplier is calling stating Dr Moshe Cipro wants her to have oxygen, but states Dr Moshe Cipro took her off of oxygen

## 2021-05-09 NOTE — Telephone Encounter (Signed)
Left message

## 2021-05-10 ENCOUNTER — Ambulatory Visit (HOSPITAL_COMMUNITY): Payer: Medicare Other

## 2021-05-11 NOTE — Telephone Encounter (Signed)
Lvm that mammo was normal

## 2021-05-15 NOTE — Telephone Encounter (Signed)
Left message

## 2021-06-05 ENCOUNTER — Ambulatory Visit (HOSPITAL_COMMUNITY): Admission: RE | Admit: 2021-06-05 | Payer: Medicare Other | Source: Ambulatory Visit

## 2021-07-24 ENCOUNTER — Other Ambulatory Visit: Payer: Self-pay | Admitting: Family Medicine

## 2021-08-09 ENCOUNTER — Other Ambulatory Visit: Payer: Self-pay

## 2021-08-09 ENCOUNTER — Ambulatory Visit (INDEPENDENT_AMBULATORY_CARE_PROVIDER_SITE_OTHER): Payer: Medicare Other

## 2021-08-09 DIAGNOSIS — Z0000 Encounter for general adult medical examination without abnormal findings: Secondary | ICD-10-CM | POA: Diagnosis not present

## 2021-08-09 NOTE — Progress Notes (Signed)
Subjective:   Bianca Mcguire is a 75 y.o. female who presents for Medicare Annual (Subsequent) preventive examination.I connected with  Bianca Mcguire on 08/09/21 by a audio enabled telemedicine application and verified that I am speaking with the correct person using two identifiers.   I discussed the limitations of evaluation and management by telemedicine. The patient expressed understanding and agreed to proceed.   Location of patient: Home  Location of Provider:Office  Persons participating in virtual visit: Bianca Mcguire (patient) and Edgar Frisk, [place]  Review of Systems    Defer to PCP       Objective:    There were no vitals filed for this visit. There is no height or weight on file to calculate BMI.  Advanced Directives 08/09/2021 03/31/2021 07/22/2020 08/04/2019 10/16/2018 10/13/2018 09/26/2017  Does Patient Have a Medical Advance Directive? No No No No No No No  Would patient like information on creating a medical advance directive? - No - Patient declined No - Patient declined - No - Patient declined No - Patient declined No - Patient declined    Current Medications (verified) Outpatient Encounter Medications as of 08/09/2021  Medication Sig   alendronate (FOSAMAX) 70 MG tablet TAKE 1 TABLET BY MOUTH  EVERY 7 DAYS WITH A FULL  GLASS OF WATER ON AN EMPTY  STOMACH   amLODipine (NORVASC) 5 MG tablet TAKE 1 TABLET BY MOUTH  DAILY   budesonide-formoterol (SYMBICORT) 80-4.5 MCG/ACT inhaler Inhale 2 puffs into the lungs 2 (two) times daily.   Calcium Carb-Cholecalciferol (CALCIUM 600 + D PO) Take 1 tablet by mouth every evening.    cyclobenzaprine (FLEXERIL) 5 MG tablet TAKE 1 TABLET BY MOUTH  TWICE WEEKLY AS NEEDED, FOR BACK SPASM   fluticasone (FLONASE) 50 MCG/ACT nasal spray SHAKE LIQUID AND USE 2 SPRAYS IN EACH NOSTRIL DAILY   folic acid (FOLVITE) Q000111Q MCG tablet Take 400 mcg by mouth every evening.    gabapentin (NEURONTIN) 100 MG capsule Take 1 capsule (100 mg total) by  mouth at bedtime.   ibuprofen (ADVIL) 800 MG tablet TAKE 1 TABLET(800 MG) BY MOUTH EVERY 8 HOURS AS NEEDED   ipratropium-albuterol (DUONEB) 0.5-2.5 (3) MG/3ML SOLN Take 3 mLs by nebulization every 6 (six) hours as needed.   IRON-B12-VITAMINS PO Take 500 mcg by mouth every evening.    levothyroxine (SYNTHROID) 50 MCG tablet TAKE 1 TABLET BY MOUTH  DAILY FROM MONDAY THROUGH  THURSDAY , THEN TAKE ONE  AND A HALF TABLETS DAILY ON FRIDAY SATURDAY AND SUNDAY   montelukast (SINGULAIR) 10 MG tablet TAKE 1 TABLET BY MOUTH AT  BEDTIME   Multiple Vitamins-Iron (MULTIVITAMINS WITH IRON) TABS tablet Take 1 tablet by mouth every evening.   omeprazole (PRILOSEC) 20 MG capsule TAKE 1 CAPSULE BY MOUTH  DAILY 1/2 HOUR PRIOR TO  BREAKFAST   UNABLE TO FIND Nebulizer machine with tubing and supplies.   Vitamin D, Ergocalciferol, (DRISDOL) 1.25 MG (50000 UNIT) CAPS capsule Take 50,000 Units by mouth once a week.   ipratropium-albuterol (DUONEB) 0.5-2.5 (3) MG/3ML SOLN Take 3 mLs by nebulization every 6 (six) hours as needed.   No facility-administered encounter medications on file as of 08/09/2021.    Allergies (verified) Patient has no known allergies.   History: Past Medical History:  Diagnosis Date   Acute pain of right knee 01/30/2011   Qualifier: Diagnosis of  By: Moshe Cipro MD, Leandrea     Anemia    Arthritis    Carpal tunnel syndrome, bilateral  Diverticulosis dx 2011   GERD (gastroesophageal reflux disease)    Headache    Hypertension 06/02/2018   Low back pain    Obesity    Rheumatoid arthritis(714.0)    Past Surgical History:  Procedure Laterality Date   bilateral foot surgery     COLONOSCOPY  2011   Dr. Oneida Alar: 16m sessile polyp (tubular adenoma), pancolonic diverticulosis   COLONOSCOPY N/A 09/26/2017   Procedure: COLONOSCOPY;  Surgeon: FDanie Binder MD;  [facility];  Service: Endoscopy;  Laterality: N/A;  7:30am   ESOPHAGOGASTRODUODENOSCOPY N/A 09/16/2017   Procedure:  ESOPHAGOGASTRODUODENOSCOPY (EGD);  Surgeon: FDanie Binder MD;  Location: [facility];  Service: Endoscopy;  Laterality: N/A;   FLEXIBLE SIGMOIDOSCOPY N/A 09/16/2017   Procedure: FLEXIBLE SIGMOIDOSCOPY;  Surgeon: FDanie Binder MD;  [facility]: [facility];  Service: Endoscopy;  Laterality: N/A;   HEMORRHOID SURGERY  1980   KNEE ARTHROSCOPY WITH LATERAL MENISECTOMY Right 10/16/2018   Procedure: KNEE ARTHROSCOPY WITH LATERAL MENISCECTOMY;  Surgeon: HCarole Civil MD;  Location: [facility];  Service: Orthopedics;  Laterality: Right;   OOPHORECTOMY Right    roophorectomy for benign disease  March 2011   Dr. FGlo Herring  tonsillectomy and adenoidctomy in childhood     TUBAL LIGATION  1977   Family History  Problem Relation Age of Onset   Heart failure Mother    Cancer Mother 656      oral   Heart failure Father    Arthritis Sister    Heart attack Brother    Thyroid disease Daughter    Hypertension Daughter    Hypertension Daughter    Thyroid disease Daughter    Heart failure Sister    Diabetes Sister    Hypertension Sister    Leukemia Brother    Colon cancer Neg Hx    Colon polyps Neg Hx    Social History   Socioeconomic History   Marital status: Widowed    Spouse name: Not on file   Number of children: 3   Years of education: Not on file   Highest education level: Not on file  Occupational History   Occupation: retired    Comment: part time    Occupation: disabled for arthritis     Comment: since age 75   Employer: [employer]  Tobacco Use   Smoking status: Former    Packs/day: 2.00    Years: 25.00    Pack years: 50.00    Types: Cigarettes    Quit date: 05/12/2014    Years since quitting: 7.2   Smokeless tobacco: Never  Vaping Use   Vaping Use: Never used  Substance and Sexual Activity   Alcohol use: No   Drug use: No   Sexual activity: Not Currently  Other Topics Concern   Not on file  Social History Narrative   Lives with Granddaughter since 2006   Social  Determinants of Health   Financial Resource Strain: Low Risk    Difficulty of Paying Living Expenses: Not hard at all  Food Insecurity: No Food Insecurity   Worried About RCharity fundraiserin the Last Year: Never true   RHawleyin the Last Year: Never true  Transportation Needs: No Transportation Needs   Lack of Transportation (Medical): No   Lack of Transportation (Non-Medical): No  Physical Activity: Inactive   Days of Exercise per Week: 0 days   Minutes of Exercise per Session: 0 min  Stress: No Stress  Concern Present   Feeling of Stress : Not at all  Social Connections: Moderately Isolated   Frequency of Communication with Friends and Family: More than three times a week   Frequency of Social Gatherings with Friends and Family: More than three times a week   Attends Religious Services: More than 4 times per year   Active Member of [employer] or Organizations: No   Attends Archivist Meetings: Never   Marital Status: Widowed    Tobacco Counseling Counseling given: Not Answered   Clinical Intake:  Pre-visit preparation completed: Yes  Pain : No/denies pain     Nutritional Risks: None Diabetes: No  How often do you need to have someone help you when you read instructions, pamphlets, or other written materials from your doctor or pharmacy?: 2 - Rarely What is the last grade level you completed in school?: High School  Diabetic?no  Interpreter Needed?: No  Information entered by :: Meral Geissinger J,CMA   Activities of Daily Living In your present state of health, do you have any difficulty performing the following activities: 08/09/2021  Hearing? N  Vision? N  Difficulty concentrating or making decisions? N  Walking or climbing stairs? N  Dressing or bathing? N  Doing errands, shopping? N  Preparing Food and eating ? N  Using the Toilet? N  In the past six months, have you accidently leaked urine? N  Do you have problems with loss of bowel control? N   Managing your Medications? N  Managing your Finances? N  Housekeeping or managing your Housekeeping? N  Some recent data might be hidden    Patient Care Team: Fayrene Helper, MD as PCP - [facility], Marga Melnick, MD (Inactive) as Consulting Physician (Gastroenterology)  Indicate any recent Medical Services you may have received from other than [facility] providers in the past year (date may be approximate).     Assessment:   This is a routine wellness examination for [facility].  Hearing/Vision screen No results found.  Dietary issues and exercise activities discussed: Current Exercise Habits: The patient does not participate in regular exercise at present   Goals Addressed   None    Depression Screen PHQ 2/9 Scores 08/09/2021 07/22/2020 08/12/2019 04/09/2019 02/24/2019 08/04/2018 06/02/2018  PHQ - 2 Score 0 0 0 0 0 0 0  PHQ- 9 Score - - - - - - 5    Fall Risk Fall Risk  08/09/2021 04/11/2021 07/22/2020 05/10/2020 01/12/2020  Falls in the past year? 0 0 0 0 0  Number falls in past yr: 0 0 0 0 0  Injury with Fall? 0 0 0 0 0  Risk for fall due to : No Fall Risks - No Fall Risks - -  Follow up Falls evaluation completed - Falls evaluation completed - -    FALL RISK PREVENTION PERTAINING TO THE HOME:  Any stairs in or around the home? No  If so, are there any without handrails? No  Home free of loose throw rugs in walkways, pet beds, electrical cords, etc? Yes  Adequate lighting in your home to reduce risk of falls? Yes   ASSISTIVE DEVICES UTILIZED TO PREVENT FALLS:  Life alert? No  Use of a cane, walker or w/c? No  Grab bars in the bathroom? Yes  Shower chair or bench in shower? Yes  Elevated toilet seat or a handicapped toilet? Yes   TIMED UP AND GO:  Was the test performed?  N/A .  Length of time to ambulate  10 feet: N/A sec.     Cognitive Function:     6CIT Screen 08/09/2021 07/22/2020 04/09/2019 01/07/2017  What Year? 0 points 0 points 0 points -  What month? 0 points 0  points 0 points 0 points  What time? 0 points 0 points 0 points 0 points  Count back from 20 0 points 0 points 0 points 0 points  Months in reverse 0 points 0 points 0 points 0 points  Repeat phrase 0 points 2 points 0 points 0 points  Total Score 0 2 0 -    Immunizations Immunization History  Administered Date(s) Administered   Fluad Quad(high Dose 65+) 08/24/2019   Influenza Whole 09/23/2008   Influenza,inj,Quad PF,6+ Mos 08/28/2013, 01/07/2017, 08/04/2018   Moderna Sars-Covid-2 Vaccination 02/08/2020, 03/07/2020   PPD Test 08/28/2013   Pneumococcal Conjugate-13 07/05/2014   Pneumococcal Polysaccharide-23 09/07/2008, 09/15/2013    TDAP status: Due, Education has been provided regarding the importance of this vaccine. Advised may receive this vaccine at local pharmacy or Health Dept. Aware to provide a copy of the vaccination record if obtained from local pharmacy or Health Dept. Verbalized acceptance and understanding.  Flu Vaccine status: Due, Education has been provided regarding the importance of this vaccine. Advised may receive this vaccine at local pharmacy or Health Dept. Aware to provide a copy of the vaccination record if obtained from local pharmacy or Health Dept. Verbalized acceptance and understanding.  Pneumococcal vaccine status: Up to date  Covid-19 vaccine status: Information provided on how to obtain vaccines.   Qualifies for Shingles Vaccine? Yes   Zostavax completed No   Shingrix Completed?: No.    Education has been provided regarding the importance of this vaccine. Patient has been advised to call insurance company to determine out of pocket expense if they have not yet received this vaccine. Advised may also receive vaccine at local pharmacy or Health Dept. Verbalized acceptance and understanding.  Screening Tests Health Maintenance  Topic Date Due   Zoster Vaccines- Shingrix (1 of 2) Never done   COVID-19 Vaccine (3 - Booster for Moderna series)  08/07/2020   INFLUENZA VACCINE  07/10/2021   TETANUS/TDAP  05/03/2022 (Originally 07/24/1965)   COLONOSCOPY (Pts 45-84yr Insurance coverage will need to be confirmed)  09/26/2022   DEXA SCAN  Completed   Hepatitis C Screening  Completed   PNA vac Low Risk Adult  Completed   HPV VACCINES  Aged Out    Health Maintenance  Health Maintenance Due  Topic Date Due   Zoster Vaccines- Shingrix (1 of 2) Never done   COVID-19 Vaccine (3 - Booster for Moderna series) 08/07/2020   INFLUENZA VACCINE  07/10/2021    Colorectal cancer screening: Type of screening: Colonoscopy. Completed 09/26/2017. Repeat every 5 years  Mammogram status: No longer required due to AGE.  Bone Density status: Completed 08/22/2020. Results reflect: Bone density results: OSTEOPOROSIS. Repeat every 3 years.  Lung Cancer Screening: (Low Dose CT Chest recommended if Age 75-80years, 30 pack-year currently smoking OR have quit w/in 15years.) does not qualify.   Lung Cancer Screening Referral: NO  Additional Screening:  Hepatitis C Screening: does qualify; Completed 06/05/2017  Vision Screening: Recommended annual ophthalmology exams for early detection of glaucoma and other disorders of the eye. Is the patient up to date with their annual eye exam?  No  Who is the provider or what is the name of the office in which the patient attends annual eye exams? Dr. GKaty FitchIf pt is not established with  a provider, would they like to be referred to a provider to establish care? No .   Dental Screening: Recommended annual dental exams for proper oral hygiene  Community Resource Referral / Chronic Care Management: CRR required this visit?  No   CCM required this visit?  No      Plan:     I have personally reviewed and noted the following in the patient's chart:   Medical and social history Use of alcohol, tobacco or illicit drugs  Current medications and supplements including opioid prescriptions.  Functional ability  and status Nutritional status Physical activity Advanced directives List of other physicians Hospitalizations, surgeries, and ER visits in previous 12 months Vitals Screenings to include cognitive, depression, and falls Referrals and appointments  In addition, I have reviewed and discussed with patient certain preventive protocols, quality metrics, and best practice recommendations. A written personalized care plan for preventive services as well as general preventive health recommendations were provided to patient.     Edgar Frisk, [facility]   08/09/2021   Nurse Notes: Non Face to Face 30 minute visit Encounter.   Ms. Rensch , Thank you for taking time to come for your Medicare Wellness Visit. I appreciate your ongoing commitment to your health goals. Please review the following plan we discussed and let me know if I can assist you in the future.   These are the goals we discussed:  Goals      Exercise 3x per week (30 min per time)     Patient would like to start exercising 3 times a week for at least 30 minutes at a time.        This is a list of the screening recommended for you and due dates:  Health Maintenance  Topic Date Due   Zoster (Shingles) Vaccine (1 of 2) Never done   COVID-19 Vaccine (3 - Booster for Moderna series) 08/07/2020   Flu Shot  07/10/2021   Tetanus Vaccine  05/03/2022*   Colon Cancer Screening  09/26/2022   DEXA scan (bone density measurement)  Completed   Hepatitis C Screening: USPSTF Recommendation to screen - Ages 71-79 yo.  Completed   Pneumonia vaccines  Completed   HPV Vaccine  Aged Out  *Topic was postponed. The date shown is not the original due date.    Patient has been advised that PCP office will mail AVS.

## 2021-08-09 NOTE — Patient Instructions (Signed)
Health Maintenance, Female Adopting a healthy lifestyle and getting preventive care are important in promoting health and wellness. Ask your health care provider about: The right schedule for you to have regular tests and exams. Things you can do on your own to prevent diseases and keep yourself healthy. What should I know about diet, weight, and exercise? Eat a healthy diet  Eat a diet that includes plenty of vegetables, fruits, low-fat dairy products, and lean protein. Do not eat a lot of foods that are high in solid fats, added sugars, or sodium. Maintain a healthy weight Body mass index (BMI) is used to identify weight problems. It estimates body fat based on height and weight. Your health care provider can help determine your BMI and help you achieve or maintain a healthy weight. Get regular exercise Get regular exercise. This is one of the most important things you can do for your health. Most adults should: Exercise for at least 150 minutes each week. The exercise should increase your heart rate and make you sweat (moderate-intensity exercise). Do strengthening exercises at least twice a week. This is in addition to the moderate-intensity exercise. Spend less time sitting. Even light physical activity can be beneficial. Watch cholesterol and blood lipids Have your blood tested for lipids and cholesterol at 75 years of age, then have this test every 5 years. Have your cholesterol levels checked more often if: Your lipid or cholesterol levels are high. You are older than 75 years of age. You are at high risk for heart disease. What should I know about cancer screening? Depending on your health history and family history, you may need to have cancer screening at various ages. This may include screening for: Breast cancer. Cervical cancer. Colorectal cancer. Skin cancer. Lung cancer. What should I know about heart disease, diabetes, and high blood pressure? Blood pressure and heart  disease High blood pressure causes heart disease and increases the risk of stroke. This is more likely to develop in people who have high blood pressure readings, are of African descent, or are overweight. Have your blood pressure checked: Every 3-5 years if you are 18-39 years of age. Every year if you are 40 years old or older. Diabetes Have regular diabetes screenings. This checks your fasting blood sugar level. Have the screening done: Once every three years after age 40 if you are at a normal weight and have a low risk for diabetes. More often and at a younger age if you are overweight or have a high risk for diabetes. What should I know about preventing infection? Hepatitis B If you have a higher risk for hepatitis B, you should be screened for this virus. Talk with your health care provider to find out if you are at risk for hepatitis B infection. Hepatitis C Testing is recommended for: Everyone born from 1945 through 1965. Anyone with known risk factors for hepatitis C. Sexually transmitted infections (STIs) Get screened for STIs, including gonorrhea and chlamydia, if: You are sexually active and are younger than 75 years of age. You are older than 75 years of age and your health care provider tells you that you are at risk for this type of infection. Your sexual activity has changed since you were last screened, and you are at increased risk for chlamydia or gonorrhea. Ask your health care provider if you are at risk. Ask your health care provider about whether you are at high risk for HIV. Your health care provider may recommend a prescription medicine   to help prevent HIV infection. If you choose to take medicine to prevent HIV, you should first get tested for HIV. You should then be tested every 3 months for as long as you are taking the medicine. Pregnancy If you are about to stop having your period (premenopausal) and you may become pregnant, seek counseling before you get  pregnant. Take 400 to 800 micrograms (mcg) of folic acid every day if you become pregnant. Ask for birth control (contraception) if you want to prevent pregnancy. Osteoporosis and menopause Osteoporosis is a disease in which the bones lose minerals and strength with aging. This can result in bone fractures. If you are 65 years old or older, or if you are at risk for osteoporosis and fractures, ask your health care provider if you should: Be screened for bone loss. Take a calcium or vitamin D supplement to lower your risk of fractures. Be given hormone replacement therapy (HRT) to treat symptoms of menopause. Follow these instructions at home: Lifestyle Do not use any products that contain nicotine or tobacco, such as cigarettes, e-cigarettes, and chewing tobacco. If you need help quitting, ask your health care provider. Do not use street drugs. Do not share needles. Ask your health care provider for help if you need support or information about quitting drugs. Alcohol use Do not drink alcohol if: Your health care provider tells you not to drink. You are pregnant, may be pregnant, or are planning to become pregnant. If you drink alcohol: Limit how much you use to 0-1 drink a day. Limit intake if you are breastfeeding. Be aware of how much alcohol is in your drink. In the [place] one drink equals one 12 oz bottle of beer (355 mL), one 5 oz glass of wine (148 mL), or one 1 oz glass of hard liquor (44 mL). General instructions Schedule regular health, dental, and eye exams. Stay current with your vaccines. Tell your health care provider if: You often feel depressed. You have ever been abused or do not feel safe at home. Summary Adopting a healthy lifestyle and getting preventive care are important in promoting health and wellness. Follow your health care provider's instructions about healthy diet, exercising, and getting tested or screened for diseases. Follow your health care provider's  instructions on monitoring your cholesterol and blood pressure. This information is not intended to replace advice given to you by your health care provider. Make sure you discuss any questions you have with your health care provider. Document Revised: 02/03/2021 Document Reviewed: 11/19/2018 Elsevier Patient Education  2022 [employer] Inc.  

## 2021-08-14 ENCOUNTER — Other Ambulatory Visit: Payer: Self-pay | Admitting: Family Medicine

## 2021-08-28 ENCOUNTER — Encounter: Payer: Medicare Other | Admitting: Family Medicine

## 2021-08-30 ENCOUNTER — Other Ambulatory Visit: Payer: Self-pay

## 2021-08-30 ENCOUNTER — Encounter: Payer: Self-pay | Admitting: Family Medicine

## 2021-08-30 ENCOUNTER — Ambulatory Visit (INDEPENDENT_AMBULATORY_CARE_PROVIDER_SITE_OTHER): Payer: Medicare Other | Admitting: Family Medicine

## 2021-08-30 VITALS — BP 142/68 | HR 88 | Wt 138.7 lb

## 2021-08-30 DIAGNOSIS — J209 Acute bronchitis, unspecified: Secondary | ICD-10-CM

## 2021-08-30 DIAGNOSIS — R110 Nausea: Secondary | ICD-10-CM | POA: Diagnosis not present

## 2021-08-30 DIAGNOSIS — J440 Chronic obstructive pulmonary disease with acute lower respiratory infection: Secondary | ICD-10-CM

## 2021-08-30 MED ORDER — BENZONATATE 100 MG PO CAPS: 100.0000 mg | ORAL_CAPSULE | Freq: Two times a day (BID) | ORAL | 0 refills | Status: DC | PRN

## 2021-08-30 MED ORDER — ONDANSETRON HCL 4 MG PO TABS: 4.0000 mg | ORAL_TABLET | Freq: Three times a day (TID) | ORAL | 0 refills | Status: DC | PRN

## 2021-08-30 MED ORDER — AZITHROMYCIN 250 MG PO TABS: ORAL_TABLET | ORAL | 0 refills | Status: AC

## 2021-08-30 NOTE — Progress Notes (Signed)
Virtual Visit via Telephone Note  I connected with Bianca Mcguire on 08/30/21 at  2:00 PM EDT by telephone and verified that I am speaking with the correct person using two identifiers.  Location: Patient: home Provider: office   I discussed the limitations, risks, security and privacy concerns of performing an evaluation and management service by telephone and the availability of in person appointments. I also discussed with the patient that there may be a patient responsible charge related to this service. The patient expressed understanding and agreed to proceed.   History of Present Illness:   10 day h/o cough and chest congestion also head congestion , as well as nausea and poor appetite  She has had 1 negative covid test and has no known covid contact Observations/Objective: BP (!) 142/68   Pulse 88   Wt 138 lb 11.2 oz (62.9 kg)   BMI 25.37 kg/m  Good communication with no confusion and intact memory. Alert and oriented x 3 No signs of respiratory distress during speech   Assessment and Plan:  Acute bronchitis with COPD ([place]) Z pack and tessalon perle prescribe, needs re testing for covid and is aware  Nausea Limited amount of zofran is prescribed  Follow Up Instructions:    I discussed the assessment and treatment plan with the patient. The patient was provided an opportunity to ask questions and all were answered. The patient agreed with the plan and demonstrated an understanding of the instructions.   The patient was advised to call back or seek an in-person evaluation if the symptoms worsen or if the condition fails to improve as anticipated.  I provided 14 minutes of non-face-to-face time during this encounter.   Tula Nakayama, MD

## 2021-08-30 NOTE — Patient Instructions (Signed)
Reschedule physical to 2 weeks later please, call if you need me  sooner  Please arrange with nurse to get covid tsting done today or tomorrow  Azithromycin, tessalon perles and zofran are preescribed.  Please go to the urgent care if you feel worse as in fever that wont break, shortness of breath, or excess fatigue  Thanks for choosing [facility], we consider it a privelige to serve you.

## 2021-09-01 ENCOUNTER — Encounter: Payer: Medicare Other | Admitting: Family Medicine

## 2021-09-04 ENCOUNTER — Encounter: Payer: Self-pay | Admitting: Family Medicine

## 2021-09-04 DIAGNOSIS — R110 Nausea: Secondary | ICD-10-CM | POA: Insufficient documentation

## 2021-09-04 NOTE — Assessment & Plan Note (Signed)
Z pack and tessalon perle prescribe, needs re testing for covid and is aware

## 2021-09-04 NOTE — Assessment & Plan Note (Signed)
Limited amount of zofran is prescribed

## 2021-10-26 ENCOUNTER — Other Ambulatory Visit: Payer: Self-pay

## 2021-10-26 ENCOUNTER — Encounter: Payer: Self-pay | Admitting: Family Medicine

## 2021-10-26 ENCOUNTER — Ambulatory Visit (INDEPENDENT_AMBULATORY_CARE_PROVIDER_SITE_OTHER): Payer: Medicare Other | Admitting: Family Medicine

## 2021-10-26 VITALS — BP 136/78 | HR 87 | Resp 18 | Ht 62.0 in | Wt 140.0 lb

## 2021-10-26 DIAGNOSIS — Z1211 Encounter for screening for malignant neoplasm of colon: Secondary | ICD-10-CM | POA: Diagnosis not present

## 2021-10-26 DIAGNOSIS — R7302 Impaired glucose tolerance (oral): Secondary | ICD-10-CM

## 2021-10-26 DIAGNOSIS — Z0001 Encounter for general adult medical examination with abnormal findings: Secondary | ICD-10-CM

## 2021-10-26 DIAGNOSIS — I10 Essential (primary) hypertension: Secondary | ICD-10-CM

## 2021-10-26 DIAGNOSIS — Z23 Encounter for immunization: Secondary | ICD-10-CM | POA: Diagnosis not present

## 2021-10-26 DIAGNOSIS — Z0000 Encounter for general adult medical examination without abnormal findings: Secondary | ICD-10-CM

## 2021-10-26 DIAGNOSIS — H547 Unspecified visual loss: Secondary | ICD-10-CM | POA: Insufficient documentation

## 2021-10-26 DIAGNOSIS — E785 Hyperlipidemia, unspecified: Secondary | ICD-10-CM

## 2021-10-26 NOTE — Patient Instructions (Addendum)
F/U in 4.5 months, re evaluate weight and chronic health conditions, call if you need me sooner  Please schedule lung cancer screening at checkout  Please arrange  cologuard test at checkout  Please refill symbicort x 6 months and tessalon perles x  2  Yes you may get the covid booster today  TSH, cBC and chem 7 and eGFR today  Thanks for choosing [facility], we consider it a privelige to serve you.

## 2021-10-27 LAB — BMP8+EGFR
BUN/Creatinine Ratio: 32 — ABNORMAL HIGH (ref 12–28)
BUN: 20 mg/dL (ref 8–27)
CO2: 23 mmol/L (ref 20–29)
Calcium: 9.6 mg/dL (ref 8.7–10.3)
Chloride: 101 mmol/L (ref 96–106)
Creatinine, Ser: 0.62 mg/dL (ref 0.57–1.00)
Glucose: 87 mg/dL (ref 70–99)
Potassium: 4.8 mmol/L (ref 3.5–5.2)
Sodium: 138 mmol/L (ref 134–144)
eGFR: 93 mL/min/{1.73_m2} (ref >59)

## 2021-10-27 LAB — CBC
Hematocrit: 37.7 % (ref 34.0–46.6)
Hemoglobin: 12.4 g/dL (ref 11.1–15.9)
MCH: 26.7 pg (ref 26.6–33.0)
MCHC: 32.9 g/dL (ref 31.5–35.7)
MCV: 81 fL (ref 79–97)
Platelets: 308 10*3/uL (ref 150–450)
RBC: 4.64 x10E6/uL (ref 3.77–5.28)
RDW: 14.3 % (ref 11.7–15.4)
WBC: 8.8 10*3/uL (ref 3.4–10.8)

## 2021-10-27 LAB — TSH: TSH: 3.42 u[IU]/mL (ref 0.450–4.500)

## 2021-10-29 NOTE — Assessment & Plan Note (Addendum)
Annual exam as documented. Counseling done  re healthy lifestyle involving commitment to 150 minutes exercise per week, heart healthy diet, and attaining healthy weight.The importance of adequate sleep also discussed. Changes in health habits are decided on by the patient with goals and time frames  set for achieving them. Immunization and cancer screening needs are specifically addressed at this visit. 

## 2021-10-29 NOTE — Progress Notes (Signed)
    Bianca Mcguire     MRN: [id number]      DOB: 1946-09-15  HPI: Patient is in for annual physical exam. No other health concerns are expressed or addressed at the visit. Recent labs,  are reviewed. Immunization is reviewed , and  updated if needed.   PE: BP 136/78   Pulse 87   Resp 18   Ht 5\' 2"  (1.575 m)   Wt 140 lb 0.6 oz (63.5 kg)   SpO2 90%   BMI 25.61 kg/m   Pleasant  female, alert and oriented x 3, in no cardio-pulmonary distress. Afebrile. HEENT No facial trauma or asymetry. Sinuses non tender.  Extra occullar muscles intact.. External ears normal, . Neck: decrease ROM, no adenopathy,JVD or thyromegaly.No bruits.  Chest: Clear to ascultation bilaterally.No crackles or wheezes. Decreased air entry throughout Non tender to palpation    Cardiovascular system; Heart sounds normal,  S1 and  S2 ,no S3.  No murmur, or thrill. Apical beat not displaced Peripheral pulses normal.  Abdomen: Soft, non tender,  No guarding, tenderness or rebound.   Musculoskeletal exam: Decreased  ROM of spine, hips , shoulders and knees. deformity ,swelling and   crepitus noted. No muscle wasting or atrophy.   Neurologic: Cranial nerves 2 to 12 intact. Power, tone ,sensation normal throughout.  disturbance in gait. No tremor.  Skin: Intact, no ulceration, erythema , scaling or rash noted. Pigmentation normal throughout  Psych; Slightly depressed and anxious mood and affect. Judgement and concentration normal   Assessment & Plan:  Encounter for annual physical exam Annual exam as documented. Counseling done  re healthy lifestyle involving commitment to 150 minutes exercise per week, heart healthy diet, and attaining healthy weight.The importance of adequate sleep also discussed. Changes in health habits are decided on by the patient with goals and time frames  set for achieving them. Immunization and cancer screening needs are specifically addressed at this  visit.

## 2021-11-16 DIAGNOSIS — Z1211 Encounter for screening for malignant neoplasm of colon: Secondary | ICD-10-CM | POA: Diagnosis not present

## 2021-11-24 ENCOUNTER — Other Ambulatory Visit: Payer: Self-pay

## 2021-11-24 DIAGNOSIS — Z1211 Encounter for screening for malignant neoplasm of colon: Secondary | ICD-10-CM

## 2021-11-24 LAB — COLOGUARD: COLOGUARD: POSITIVE — AB

## 2021-11-24 NOTE — Progress Notes (Signed)
Called pt home and mobile number left a VM at home to call office back.

## 2021-11-28 ENCOUNTER — Encounter: Payer: Self-pay | Admitting: Internal Medicine

## 2021-12-19 ENCOUNTER — Encounter (HOSPITAL_COMMUNITY): Payer: Self-pay

## 2021-12-19 NOTE — Progress Notes (Signed)
Attempted to reach patient regarding [facility]. Unable to reach patient at this time, no VM available.

## 2022-01-01 ENCOUNTER — Encounter (HOSPITAL_COMMUNITY): Payer: Self-pay

## 2022-01-01 ENCOUNTER — Other Ambulatory Visit (HOSPITAL_COMMUNITY): Payer: Self-pay

## 2022-01-01 NOTE — Progress Notes (Signed)
Call placed to patient regarding LCS. Patient states that she is not interested at this time. Referral closed at patient's request.

## 2022-01-01 NOTE — Telephone Encounter (Signed)
Opened in error

## 2022-01-09 ENCOUNTER — Other Ambulatory Visit: Payer: Self-pay | Admitting: Family Medicine

## 2022-02-28 ENCOUNTER — Ambulatory Visit (INDEPENDENT_AMBULATORY_CARE_PROVIDER_SITE_OTHER): Payer: Medicare Other | Admitting: Family Medicine

## 2022-02-28 ENCOUNTER — Encounter (INDEPENDENT_AMBULATORY_CARE_PROVIDER_SITE_OTHER): Payer: Self-pay

## 2022-02-28 ENCOUNTER — Encounter: Payer: Self-pay | Admitting: Family Medicine

## 2022-02-28 ENCOUNTER — Other Ambulatory Visit: Payer: Self-pay

## 2022-02-28 VITALS — BP 130/70 | HR 86 | Ht 62.0 in | Wt 143.1 lb

## 2022-02-28 DIAGNOSIS — J440 Chronic obstructive pulmonary disease with acute lower respiratory infection: Secondary | ICD-10-CM | POA: Diagnosis not present

## 2022-02-28 DIAGNOSIS — E039 Hypothyroidism, unspecified: Secondary | ICD-10-CM | POA: Diagnosis not present

## 2022-02-28 DIAGNOSIS — E559 Vitamin D deficiency, unspecified: Secondary | ICD-10-CM | POA: Diagnosis not present

## 2022-02-28 DIAGNOSIS — Z1231 Encounter for screening mammogram for malignant neoplasm of breast: Secondary | ICD-10-CM | POA: Diagnosis not present

## 2022-02-28 DIAGNOSIS — E785 Hyperlipidemia, unspecified: Secondary | ICD-10-CM

## 2022-02-28 DIAGNOSIS — Z87891 Personal history of nicotine dependence: Secondary | ICD-10-CM | POA: Diagnosis not present

## 2022-02-28 DIAGNOSIS — J209 Acute bronchitis, unspecified: Secondary | ICD-10-CM | POA: Diagnosis not present

## 2022-02-28 DIAGNOSIS — M069 Rheumatoid arthritis, unspecified: Secondary | ICD-10-CM

## 2022-02-28 DIAGNOSIS — I10 Essential (primary) hypertension: Secondary | ICD-10-CM | POA: Diagnosis not present

## 2022-02-28 MED ORDER — AZITHROMYCIN 250 MG PO TABS: ORAL_TABLET | ORAL | 0 refills | Status: AC

## 2022-02-28 MED ORDER — BENZONATATE 100 MG PO CAPS: 100.0000 mg | ORAL_CAPSULE | Freq: Two times a day (BID) | ORAL | 2 refills | Status: DC | PRN

## 2022-02-28 NOTE — Patient Instructions (Addendum)
Annual exam Nov 18 o or after, call if you need me sooner ? ? ? ?Z pack prescribed for chronic cough and tessalon perles ? ?Please schedule mammogram and chest scan for lung cancer screening at checkout ? ?CBC, lipid, cmp and EGFr, TSH, vit D today ? ?All the best with colonoscopy ? ? ?Thanks for choosing [facility], we consider it a privelige to serve you. ? ? ? ?

## 2022-03-01 LAB — CBC
Hematocrit: 37.6 % (ref 34.0–46.6)
Hemoglobin: 12.2 g/dL (ref 11.1–15.9)
MCH: 26.8 pg (ref 26.6–33.0)
MCHC: 32.4 g/dL (ref 31.5–35.7)
MCV: 83 fL (ref 79–97)
Platelets: 268 10*3/uL (ref 150–450)
RBC: 4.56 x10E6/uL (ref 3.77–5.28)
RDW: 14.2 % (ref 11.7–15.4)
WBC: 7.9 10*3/uL (ref 3.4–10.8)

## 2022-03-01 LAB — CMP14+EGFR
ALT: 11 IU/L (ref 0–32)
AST: 22 IU/L (ref 0–40)
Albumin/Globulin Ratio: 1.1 — ABNORMAL LOW (ref 1.2–2.2)
Albumin: 3.9 g/dL (ref 3.7–4.7)
Alkaline Phosphatase: 113 IU/L (ref 44–121)
BUN/Creatinine Ratio: 23 (ref 12–28)
BUN: 16 mg/dL (ref 8–27)
Bilirubin Total: 0.2 mg/dL (ref 0.0–1.2)
CO2: 25 mmol/L (ref 20–29)
Calcium: 9.4 mg/dL (ref 8.7–10.3)
Chloride: 104 mmol/L (ref 96–106)
Creatinine, Ser: 0.71 mg/dL (ref 0.57–1.00)
Globulin, Total: 3.4 g/dL (ref 1.5–4.5)
Glucose: 107 mg/dL — ABNORMAL HIGH (ref 70–99)
Potassium: 4.3 mmol/L (ref 3.5–5.2)
Sodium: 142 mmol/L (ref 134–144)
Total Protein: 7.3 g/dL (ref 6.0–8.5)
eGFR: 89 mL/min/{1.73_m2} (ref >59)

## 2022-03-01 LAB — LIPID PANEL
Chol/HDL Ratio: 3 ratio (ref 0.0–4.4)
Cholesterol, Total: 199 mg/dL (ref 100–199)
HDL: 67 mg/dL (ref >39)
LDL Chol Calc (NIH): 120 mg/dL — ABNORMAL HIGH (ref 0–99)
Triglycerides: 64 mg/dL (ref 0–149)
VLDL Cholesterol Cal: 12 mg/dL (ref 5–40)

## 2022-03-01 LAB — VITAMIN D 25 HYDROXY (VIT D DEFICIENCY, FRACTURES): Vit D, 25-Hydroxy: 50.3 ng/mL (ref 30.0–100.0)

## 2022-03-01 LAB — TSH: TSH: 6.16 u[IU]/mL — ABNORMAL HIGH (ref 0.450–4.500)

## 2022-03-04 ENCOUNTER — Other Ambulatory Visit: Payer: Self-pay | Admitting: Family Medicine

## 2022-03-04 ENCOUNTER — Encounter: Payer: Self-pay | Admitting: Family Medicine

## 2022-03-04 MED ORDER — LEVOTHYROXINE SODIUM 75 MCG PO TABS: 75.0000 ug | ORAL_TABLET | Freq: Every day | ORAL | 3 refills | Status: DC

## 2022-03-04 NOTE — Assessment & Plan Note (Addendum)
Un Controlled,  change in medication dose sent to mail order based on labs done day of visit, pt to be contacted ? ?

## 2022-03-04 NOTE — Progress Notes (Signed)
? ?  Bianca Mcguire     MRN: [id number]      DOB: 1946-05-25 ? ? ?HPI ?Bianca Mcguire is here for follow up and re-evaluation of chronic medical conditions, medication management and review of any available recent lab and radiology data.  ?Preventive health is updated, specifically  Cancer screening and Immunization.   ?Has upcoming GI appt for positive cologuard, denies change in stool or abdominal pain, or appetite change ?The PT denies any adverse reactions to current medications since the last visit.  ?C/o lingering cough x 3 weeks, thick sputum.  ?Chronic generalized joint pain and deformity, holding off on Rheumatology re eval, at this time, notes reduced use of hands ? ?ROS ?Denies recent fever or chills. ?Denies sinus pressure, nasal congestion, ear pain or sore throat. ?. ?Denies chest pains, palpitations and leg swelling ?Denies abdominal pain, nausea, vomiting,diarrhea or constipation.   ?Denies dysuria, frequency, hesitancy or incontinence. ? ?Denies headaches, seizures, numbness, or tingling. ?Denies depression, anxiety or insomnia. ?Denies skin break down or rash. ? ? ?PE ? ?BP 130/70   Pulse 86   Ht '5\' 2"'$  (1.575 m)   Wt 143 lb 1.9 oz (64.9 kg)   SpO2 93%   BMI 26.18 kg/m?  ? ?Patient alert and oriented and in no cardiopulmonary distress. ? ?HEENT: No facial asymmetry, EOMI,     Neck supple . ? ?Chest: decreased air entered scattered coarse crackles ? ?CVS: S1, S2 no murmurs, no S3.Regular rate. ? ?ABD: Soft non tender.  ? ?Ext: No edema ? ?MS: Markedly reduced  ROM spine, shoulders, hips and knees.Marked deformity of all fingers ? ?Skin: Intact, no ulcerations or rash noted. ? ?Psych: Good eye contact, normal affect. Memory intact not anxious or depressed appearing. ? ?CNS: CN 2-12 intact, power,  normal throughout.no focal deficits noted. ? ? ?Assessment & Plan ? ?Essential hypertension ?DASH diet and commitment to daily physical activity for a minimum of 30 minutes discussed and encouraged, as a part  of hypertension management. ?The importance of attaining a healthy weight is also discussed. ? ? ?  02/28/2022  ?  8:30 AM 02/28/2022  ?  8:09 AM 10/26/2021  ? 10:20 AM 10/26/2021  ?  9:43 AM 08/30/2021  ?  3:04 PM 04/11/2021  ? 12:49 PM 04/11/2021  ? 10:31 AM  ?BP/Weight  ?Systolic BP 124 580 998 338 142 133 144  ?Diastolic BP 70 78 78 89 68 72 73  ?Wt. (Lbs)  143.12  140.04 138.7  146  ?BMI  26.18 kg/m2  25.61 kg/m2 25.37 kg/m2  26.7 kg/m2  ? ? ? ?Controlled, no change in medication ? ? ?Adult hypothyroidism ?Un Controlled,  change in medication dose sent to mail order based on labs done day of visit, pt to be contacted ? ? ?Acute bronchitis with COPD ([place]) ?z pack and tessalon perles prescribed ? ?H/O nicotine dependence ?Needs chest scan for screening and agrees ? ?Rheumatoid arthritis ([place]) ?Worsening, will call when she decides on Rheumatology re eval ? ?Vitamin D deficiency ?Corrected, stop weekly vit D, take OTC vit D3 , 1000 IU daily ? ? ?

## 2022-03-04 NOTE — Assessment & Plan Note (Signed)
Corrected, stop weekly vit D, take OTC vit D3 , 1000 IU daily ?

## 2022-03-04 NOTE — Assessment & Plan Note (Signed)
Needs chest scan for screening and agrees ?

## 2022-03-04 NOTE — Assessment & Plan Note (Signed)
DASH diet and commitment to daily physical activity for a minimum of 30 minutes discussed and encouraged, as a part of hypertension management. ?The importance of attaining a healthy weight is also discussed. ? ? ?  02/28/2022  ?  8:30 AM 02/28/2022  ?  8:09 AM 10/26/2021  ? 10:20 AM 10/26/2021  ?  9:43 AM 08/30/2021  ?  3:04 PM 04/11/2021  ? 12:49 PM 04/11/2021  ? 10:31 AM  ?BP/Weight  ?Systolic BP 148 307 354 301 142 133 144  ?Diastolic BP 70 78 78 89 68 72 73  ?Wt. (Lbs)  143.12  140.04 138.7  146  ?BMI  26.18 kg/m2  25.61 kg/m2 25.37 kg/m2  26.7 kg/m2  ? ? ? ?Controlled, no change in medication ? ?

## 2022-03-04 NOTE — Assessment & Plan Note (Signed)
Worsening, will call when she decides on Rheumatology re eval ?

## 2022-03-04 NOTE — Assessment & Plan Note (Signed)
z pack and tessalon perles prescribed ?

## 2022-03-05 ENCOUNTER — Other Ambulatory Visit: Payer: Self-pay

## 2022-03-05 DIAGNOSIS — I10 Essential (primary) hypertension: Secondary | ICD-10-CM

## 2022-03-15 ENCOUNTER — Ambulatory Visit (INDEPENDENT_AMBULATORY_CARE_PROVIDER_SITE_OTHER): Payer: Medicare Other | Admitting: Family Medicine

## 2022-03-15 ENCOUNTER — Encounter: Payer: Self-pay | Admitting: Family Medicine

## 2022-03-15 VITALS — BP 116/74 | HR 75 | Ht 62.0 in | Wt 143.0 lb

## 2022-03-15 DIAGNOSIS — B029 Zoster without complications: Secondary | ICD-10-CM | POA: Insufficient documentation

## 2022-03-15 DIAGNOSIS — B009 Herpesviral infection, unspecified: Secondary | ICD-10-CM | POA: Diagnosis not present

## 2022-03-15 DIAGNOSIS — B028 Zoster with other complications: Secondary | ICD-10-CM | POA: Diagnosis not present

## 2022-03-15 MED ORDER — VALACYCLOVIR HCL 1 G PO TABS: 1000.0000 mg | ORAL_TABLET | Freq: Three times a day (TID) | ORAL | 0 refills | Status: AC

## 2022-03-15 NOTE — Patient Instructions (Addendum)
I appreciate the opportunity to provide care to you today! ?  ?Please pick up your medication (valacyclovir) at the pharmacy.  ?Take your valacyclovir by mouth every 8 hours for 7 days. ? ?  ?It was a pleasure to see you and I look forward to continuing to work together on your health and well-being. ?Please do not hesitate to call the office if you need care or have questions about your care. ?  ?Have a wonderful day and week. ?With Gratitude, ?Alvira Monday MSN, FNP-BC  ?

## 2022-03-15 NOTE — Progress Notes (Addendum)
? ?Acute Office Visit ? ?Subjective:  ? ? Patient ID: Bianca Mcguire, female    DOB: [birth year], 76 y.o.   MRN: [id number] ? ?Chief Complaint  ?Patient presents with  ? Rash  ?  Has rash on back, on her abdomen, states she started breaking out on Tuesday.   ? ? ?Rash ?This is a new problem. The current episode started in the past 7 days. The problem has been rapidly worsening since onset. The affected locations include the back, abdomen and right arm (right side for lower back, abdomen and arm). The rash is characterized by redness, itchiness, burning and pain. She was exposed to nothing. Pertinent negatives include no cough, eye pain, fatigue, fever, rhinorrhea or vomiting. (none) Past treatments include anti-itch cream (Bath treatment natural colloidal oatmeal). The treatment provided no relief. Her past medical history is significant for varicella.  ? ? ?Past Medical History:  ?Diagnosis Date  ? Acute pain of right knee 01/30/2011  ? Qualifier: Diagnosis of  By: Moshe Cipro MD, Joycelyn Schmid    ? Anemia   ? Arthritis   ? Carpal tunnel syndrome, bilateral   ? Diverticulosis dx 2011  ? GERD (gastroesophageal reflux disease)   ? Headache   ? Hypertension 06/02/2018  ? Low back pain   ? Obesity   ? Rheumatoid arthritis(714.0)   ? ? ?Past Surgical History:  ?Procedure Laterality Date  ? bilateral foot surgery    ? COLONOSCOPY  2011  ? Dr. Oneida Alar: 44m sessile polyp (tubular adenoma), pancolonic diverticulosis  ? COLONOSCOPY N/A 09/26/2017  ? Procedure: COLONOSCOPY;  Surgeon: FDanie Binder MD;  [facility];  Service: Endoscopy;  Laterality: N/A;  7:30am  ? ESOPHAGOGASTRODUODENOSCOPY N/A 09/16/2017  ? Procedure: ESOPHAGOGASTRODUODENOSCOPY (EGD);  Surgeon: FDanie Binder MD;  Location: [facility];  Service: Endoscopy;  Laterality: N/A;  ? FLEXIBLE SIGMOIDOSCOPY N/A 09/16/2017  ? Procedure: FLEXIBLE SIGMOIDOSCOPY;  Surgeon: FDanie Binder MD;  [facility]: [facility];  Service: Endoscopy;  Laterality: N/A;  ?  HBlyn ? KNEE ARTHROSCOPY WITH LATERAL MENISECTOMY Right 10/16/2018  ? Procedure: KNEE ARTHROSCOPY WITH LATERAL MENISCECTOMY;  Surgeon: HCarole Civil MD;  Location: [facility];  Service: Orthopedics;  Laterality: Right;  ? OOPHORECTOMY Right   ? roophorectomy for benign disease  March 2011  ? Dr. FGlo Herring ? tonsillectomy and adenoidctomy in childhood    ? TUBAL LIGATION  1977  ? ? ?Family History  ?Problem Relation Age of Onset  ? Heart failure Mother   ? Cancer Mother 662 ?     oral  ? Heart failure Father   ? Arthritis Sister   ? Heart attack Brother   ? Thyroid disease Daughter   ? Hypertension Daughter   ? Hypertension Daughter   ? Thyroid disease Daughter   ? Heart failure Sister   ? Diabetes Sister   ? Hypertension Sister   ? Leukemia Brother   ? Colon cancer Neg Hx   ? Colon polyps Neg Hx   ? ? ?Social History  ? ?Socioeconomic History  ? Marital status: Widowed  ?  Spouse name: Not on file  ? Number of children: 3  ? Years of education: Not on file  ? Highest education level: Not on file  ?Occupational History  ? Occupation: retired  ?  Comment: part time   ? Occupation: disabled for arthritis   ?  Comment: since age [id number] ?  Employer: [employer]  ?Tobacco Use  ?  Smoking status: Former  ?  Packs/day: 2.00  ?  Years: 25.00  ?  Pack years: 50.00  ?  Types: Cigarettes  ?  Quit date: 05/12/2014  ?  Years since quitting: 7.8  ? Smokeless tobacco: Never  ?Vaping Use  ? Vaping Use: Never used  ?Substance and Sexual Activity  ? Alcohol use: No  ? Drug use: No  ? Sexual activity: Not Currently  ?Other Topics Concern  ? Not on file  ?Social History Narrative  ? Lives with Granddaughter since 2006  ? ?Social Determinants of Health  ? ?Financial Resource Strain: Low Risk   ? Difficulty of Paying Living Expenses: Not hard at all  ?Food Insecurity: No Food Insecurity  ? Worried About Charity fundraiser in the Last Year: Never true  ? Ran Out of Food in the Last Year: Never true  ?Transportation Needs: No  Transportation Needs  ? Lack of Transportation (Medical): No  ? Lack of Transportation (Non-Medical): No  ?Physical Activity: Inactive  ? Days of Exercise per Week: 0 days  ? Minutes of Exercise per Session: 0 min  ?Stress: No Stress Concern Present  ? Feeling of Stress : Not at all  ?Social Connections: Moderately Isolated  ? Frequency of Communication with Friends and Family: More than three times a week  ? Frequency of Social Gatherings with Friends and Family: More than three times a week  ? Attends Religious Services: More than 4 times per year  ? Active Member of Clubs or Organizations: No  ? Attends Archivist Meetings: Never  ? Marital Status: Widowed  ?Intimate Partner Violence: Not At Risk  ? Fear of Current or Ex-Partner: No  ? Emotionally Abused: No  ? Physically Abused: No  ? Sexually Abused: No  ? ? ?Outpatient Medications Prior to Visit  ?Medication Sig Dispense Refill  ? alendronate (FOSAMAX) 70 MG tablet TAKE 1 TABLET BY MOUTH  EVERY 7 DAYS WITH A FULL  GLASS OF WATER ON AN EMPTY  STOMACH 12 tablet 3  ? amLODipine (NORVASC) 5 MG tablet TAKE 1 TABLET BY MOUTH  DAILY 90 tablet 3  ? benzonatate (TESSALON) 100 MG capsule Take 1 capsule (100 mg total) by mouth 2 (two) times daily as needed for cough. 20 capsule 2  ? budesonide-formoterol (SYMBICORT) 80-4.5 MCG/ACT inhaler Inhale 2 puffs into the lungs 2 (two) times daily. 1 each 3  ? Calcium Carb-Cholecalciferol (CALCIUM 600 + D PO) Take 1 tablet by mouth every evening.     ? cyclobenzaprine (FLEXERIL) 5 MG tablet TAKE 1 TABLET BY MOUTH  TWICE WEEKLY AS NEEDED, FOR BACK SPASM 8 tablet 2  ? fluticasone (FLONASE) 50 MCG/ACT nasal spray SHAKE LIQUID AND USE 2 SPRAYS IN EACH NOSTRIL DAILY 16 g 6  ? folic acid (FOLVITE) 073 MCG tablet Take 400 mcg by mouth every evening.     ? gabapentin (NEURONTIN) 100 MG capsule TAKE 1 CAPSULE BY MOUTH AT  BEDTIME 90 capsule 3  ? ibuprofen (ADVIL) 800 MG tablet TAKE 1 TABLET(800 MG) BY MOUTH EVERY 8 HOURS AS  NEEDED 90 tablet 1  ? ipratropium-albuterol (DUONEB) 0.5-2.5 (3) MG/3ML SOLN Take 3 mLs by nebulization every 6 (six) hours as needed. 360 mL 0  ? IRON-B12-VITAMINS PO Take 500 mcg by mouth every evening.     ? levothyroxine (SYNTHROID) 75 MCG tablet Take 1 tablet (75 mcg total) by mouth daily. 90 tablet 3  ? montelukast (SINGULAIR) 10 MG tablet TAKE 1 TABLET BY  MOUTH AT  BEDTIME 90 tablet 3  ? Multiple Vitamins-Iron (MULTIVITAMINS WITH IRON) TABS tablet Take 1 tablet by mouth every evening.    ? omeprazole (PRILOSEC) 20 MG capsule TAKE 1 CAPSULE BY MOUTH  DAILY 1/2 HOUR PRIOR TO  BREAKFAST 90 capsule 3  ? ondansetron (ZOFRAN) 4 MG tablet Take 1 tablet (4 mg total) by mouth every 8 (eight) hours as needed for nausea or vomiting. 20 tablet 0  ? UNABLE TO FIND Nebulizer machine with tubing and supplies. 1 Product 0  ? ipratropium-albuterol (DUONEB) 0.5-2.5 (3) MG/3ML SOLN Take 3 mLs by nebulization every 6 (six) hours as needed. 360 mL 0  ? ?No facility-administered medications prior to visit.  ? ? ?No Known Allergies ? ?Review of Systems  ?Constitutional:  Negative for fatigue and fever.  ?HENT:  Negative for rhinorrhea, sinus pressure and sinus pain.   ?Eyes:  Negative for pain and redness.  ?Respiratory:  Negative for cough, chest tightness and stridor.   ?Gastrointestinal:  Negative for constipation, nausea and vomiting.  ?Endocrine: Negative for polydipsia, polyphagia and polyuria.  ?Genitourinary:  Negative for dysuria, flank pain and hematuria.  ?Musculoskeletal:  Negative for back pain and gait problem.  ?Skin:  Positive for rash.  ?Neurological:  Negative for dizziness, light-headedness and headaches.  ?Psychiatric/Behavioral:  Negative for suicidal ideas. The patient is not nervous/anxious.   ? ?   ?Objective:  ?  ?Physical Exam ?Constitutional:   ?   Appearance: Normal appearance.  ?HENT:  ?   Head: Normocephalic.  ?   Right Ear: External ear normal.  ?   Left Ear: External ear normal.  ?   Nose: Nose  normal. No congestion or rhinorrhea.  ?   Mouth/Throat:  ?   Mouth: Mucous membranes are moist.  ?   Pharynx: Oropharynx is clear.  ?Cardiovascular:  ?   Rate and Rhythm: Normal rate and regular rhythm.  ?Pulmonary

## 2022-03-15 NOTE — Assessment & Plan Note (Signed)
-  Unilaterally vesicular rash along the dermatome that is consistent with singles. ?-Valacyclovir ordered ?  ?

## 2022-03-27 ENCOUNTER — Telehealth: Payer: Self-pay

## 2022-03-27 ENCOUNTER — Encounter: Payer: Self-pay | Admitting: Gastroenterology

## 2022-03-27 ENCOUNTER — Other Ambulatory Visit: Payer: Self-pay

## 2022-03-27 ENCOUNTER — Ambulatory Visit: Payer: Medicare Other | Admitting: Gastroenterology

## 2022-03-27 VITALS — BP 140/72 | HR 88 | Temp 97.5°F | Ht 62.0 in | Wt 145.4 lb

## 2022-03-27 DIAGNOSIS — R195 Other fecal abnormalities: Secondary | ICD-10-CM | POA: Insufficient documentation

## 2022-03-27 MED ORDER — CLENPIQ 10-3.5-12 MG-GM -GM/160ML PO SOLN: 1.0000 | Freq: Once | ORAL | 0 refills | Status: AC

## 2022-03-27 NOTE — Patient Instructions (Signed)
We are arranging a colonoscopy in the near future!  Further recommendations to follow!  It was a pleasure to see you today. I want to create trusting relationships with patients to provide genuine, compassionate, and quality care. I value your feedback. If you receive a survey regarding your visit,  I greatly appreciate you taking time to fill this out.   Abdulla Pooley W. Dayvon Dax, PhD, ANP-BC [facility]   

## 2022-03-27 NOTE — Telephone Encounter (Signed)
PA for TCS submitted via [facility] website. PA# [id number], valid 05/01/22-07/30/22. ?

## 2022-03-27 NOTE — Progress Notes (Signed)
? ? ? ? ?Gastroenterology Office Note   ? ? ?Primary Care Physician:  Fayrene Helper, MD  ?Referring Physician: Dr. Moshe Cipro ?Primary Gastroenterologist: Dr. Abbey Chatters ? ? ?Chief Complaint  ? ?Chief Complaint  ?Patient presents with  ? Colonoscopy  ?  Pt here to discuss being scheduled  ? ? ? ?History of Present Illness  ? ?Bianca Mcguire is a 76 y.o. female presenting today at the request of Dr. Moshe Cipro due to positive Cologuard. She has a distant history of adenoma, with last colonoscopy 2018 without polyps. EGD at that time with gastric polyps.  ? ? ?No abdominal pain, N/V, changes in bowel habits, constipation, diarrhea, overt GI bleeding, GERD, dysphagia, unexplained weight loss, lack of appetite, unexplained weight gain.  ? ?She has a chronic cough for past year. Upcoming CT chest lung cancer screening.  ? ? ? ?Past Medical History:  ?Diagnosis Date  ? Acute pain of right knee 01/30/2011  ? Qualifier: Diagnosis of  By: Moshe Cipro MD, Joycelyn Schmid    ? Anemia   ? Arthritis   ? Carpal tunnel syndrome, bilateral   ? Diverticulosis dx 2011  ? GERD (gastroesophageal reflux disease)   ? Headache   ? Hypertension 06/02/2018  ? Low back pain   ? Obesity   ? Rheumatoid arthritis(714.0)   ? ? ?Past Surgical History:  ?Procedure Laterality Date  ? bilateral foot surgery    ? COLONOSCOPY  2011  ? Dr. Oneida Alar: 28m sessile polyp (tubular adenoma), pancolonic diverticulosis  ? COLONOSCOPY N/A 09/26/2017  ? diverticulosis, redundant colon  ? ESOPHAGOGASTRODUODENOSCOPY N/A 09/16/2017  ? Dr. FOneida Alar multiple gastric polyps, (fundic gland), mild gastritis, path with duodenal mucosa with focal villous blunting and surface erosions, moderate chronic gastritis with intestinal metaplasia and reactive changes. no H.pylori, dysplasia, or malignancy  ? FLEXIBLE SIGMOIDOSCOPY N/A 09/16/2017  ? Procedure: FLEXIBLE SIGMOIDOSCOPY;  Surgeon: FDanie Binder MD;  [facility];  Service: Endoscopy;  Laterality: N/A;  ? HLafferty ? KNEE ARTHROSCOPY WITH LATERAL MENISECTOMY Right 10/16/2018  ? Procedure: KNEE ARTHROSCOPY WITH LATERAL MENISCECTOMY;  Surgeon: HCarole Civil MD;  Location: [facility];  Service: Orthopedics;  Laterality: Right;  ? OOPHORECTOMY Right   ? roophorectomy for benign disease  02/2010  ? Dr. FGlo Herring ? tonsillectomy and adenoidctomy in childhood    ? TUBAL LIGATION  1977  ? ? ?Current Outpatient Medications  ?Medication Sig Dispense Refill  ? alendronate (FOSAMAX) 70 MG tablet TAKE 1 TABLET BY MOUTH  EVERY 7 DAYS WITH A FULL  GLASS OF WATER ON AN EMPTY  STOMACH 12 tablet 3  ? amLODipine (NORVASC) 5 MG tablet TAKE 1 TABLET BY MOUTH  DAILY 90 tablet 3  ? benzonatate (TESSALON) 100 MG capsule Take 1 capsule (100 mg total) by mouth 2 (two) times daily as needed for cough. 20 capsule 2  ? budesonide-formoterol (SYMBICORT) 80-4.5 MCG/ACT inhaler Inhale 2 puffs into the lungs 2 (two) times daily. 1 each 3  ? Calcium Carb-Cholecalciferol (CALCIUM 600 + D PO) Take 1 tablet by mouth every evening.     ? cyclobenzaprine (FLEXERIL) 5 MG tablet TAKE 1 TABLET BY MOUTH  TWICE WEEKLY AS NEEDED, FOR BACK SPASM 8 tablet 2  ? fluticasone (FLONASE) 50 MCG/ACT nasal spray SHAKE LIQUID AND USE 2 SPRAYS IN EACH NOSTRIL DAILY 16 g 6  ? folic acid (FOLVITE) 8425MCG tablet Take 400 mcg by mouth every evening.     ? ipratropium-albuterol (DUONEB) 0.5-2.5 (  3) MG/3ML SOLN Take 3 mLs by nebulization every 6 (six) hours as needed. 360 mL 0  ? IRON-B12-VITAMINS PO Take 500 mcg by mouth every evening.     ? levothyroxine (SYNTHROID) 75 MCG tablet Take 1 tablet (75 mcg total) by mouth daily. 90 tablet 3  ? montelukast (SINGULAIR) 10 MG tablet TAKE 1 TABLET BY MOUTH AT  BEDTIME 90 tablet 3  ? Multiple Vitamins-Iron (MULTIVITAMINS WITH IRON) TABS tablet Take 1 tablet by mouth every evening.    ? omeprazole (PRILOSEC) 20 MG capsule TAKE 1 CAPSULE BY MOUTH  DAILY 1/2 HOUR PRIOR TO  BREAKFAST 90 capsule 3  ? CLENPIQ 10-3.5-12 MG-GM  -GM/160ML SOLN Take 1 kit by mouth once for 1 dose. 320 mL 0  ? ipratropium-albuterol (DUONEB) 0.5-2.5 (3) MG/3ML SOLN Take 3 mLs by nebulization every 6 (six) hours as needed. 360 mL 0  ? ondansetron (ZOFRAN) 4 MG tablet Take 1 tablet (4 mg total) by mouth every 8 (eight) hours as needed for nausea or vomiting. (Patient not taking: Reported on 03/27/2022) 20 tablet 0  ? UNABLE TO FIND Nebulizer machine with tubing and supplies. 1 Product 0  ? ?No current facility-administered medications for this visit.  ? ? ?Allergies as of 03/27/2022  ? (No Known Allergies)  ? ? ?Family History  ?Problem Relation Age of Onset  ? Heart failure Mother   ? Cancer Mother 50  ?     oral  ? Heart failure Father   ? Arthritis Sister   ? Heart attack Brother   ? Thyroid disease Daughter   ? Hypertension Daughter   ? Hypertension Daughter   ? Thyroid disease Daughter   ? Heart failure Sister   ? Diabetes Sister   ? Hypertension Sister   ? Leukemia Brother   ? Colon cancer Neg Hx   ? Colon polyps Neg Hx   ? ? ?Social History  ? ?Socioeconomic History  ? Marital status: Widowed  ?  Spouse name: Not on file  ? Number of children: 3  ? Years of education: Not on file  ? Highest education level: Not on file  ?Occupational History  ? Occupation: retired  ?  Comment: part time   ? Occupation: disabled for arthritis   ?  Comment: since age 51  ?  Employer: [employer]  ?Tobacco Use  ? Smoking status: Former  ?  Packs/day: 2.00  ?  Years: 25.00  ?  Pack years: 50.00  ?  Types: Cigarettes  ?  Quit date: 05/12/2014  ?  Years since quitting: 7.8  ? Smokeless tobacco: Never  ?Vaping Use  ? Vaping Use: Never used  ?Substance and Sexual Activity  ? Alcohol use: No  ? Drug use: No  ? Sexual activity: Not Currently  ?Other Topics Concern  ? Not on file  ?Social History Narrative  ? Lives with Granddaughter since 2006  ? ?Social Determinants of Health  ? ?Financial Resource Strain: Low Risk   ? Difficulty of Paying Living Expenses: Not hard at all  ?Food  Insecurity: No Food Insecurity  ? Worried About Charity fundraiser in the Last Year: Never true  ? Ran Out of Food in the Last Year: Never true  ?Transportation Needs: No Transportation Needs  ? Lack of Transportation (Medical): No  ? Lack of Transportation (Non-Medical): No  ?Physical Activity: Inactive  ? Days of Exercise per Week: 0 days  ? Minutes of Exercise per Session: 0 min  ?Stress: No  Stress Concern Present  ? Feeling of Stress : Not at all  ?Social Connections: Moderately Isolated  ? Frequency of Communication with Friends and Family: More than three times a week  ? Frequency of Social Gatherings with Friends and Family: More than three times a week  ? Attends Religious Services: More than 4 times per year  ? Active Member of Clubs or Organizations: No  ? Attends Archivist Meetings: Never  ? Marital Status: Widowed  ?Intimate Partner Violence: Not At Risk  ? Fear of Current or Ex-Partner: No  ? Emotionally Abused: No  ? Physically Abused: No  ? Sexually Abused: No  ? ? ? ?Review of Systems  ? ?Gen: Denies any fever, chills, fatigue, weight loss, lack of appetite.  ?CV: chronic cough ?Resp: Denies shortness of breath at rest or with exertion. Denies wheezing or cough.  ?GI: see HPI ?GU : Denies urinary burning, urinary frequency, urinary hesitancy ?MS: Denies joint pain, muscle weakness, cramps, or limitation of movement.  ?Derm: Denies rash, itching, dry skin ?Psych: Denies depression, anxiety, memory loss, and confusion ?Heme: Denies bruising, bleeding, and enlarged lymph nodes. ? ? ?Physical Exam  ? ?BP 140/72   Pulse 88   Temp (!) 97.5 ?F (36.4 ?C)   Ht 5' 2" (1.575 m)   Wt 145 lb 6.4 oz (66 kg)   BMI 26.59 kg/m?  ?General:   Alert and oriented. Pleasant and cooperative. Well-nourished and well-developed.  ?Head:  Normocephalic and atraumatic. ?Eyes:  Without icterus ?Lungs: scattered rhonchi ?Cardiac: S1 S2 without murmur ?Abdomen:  +BS, soft, non-tender and non-distended. No HSM  noted. No guarding or rebound. No masses appreciated.  ?Rectal:  Deferred  ?Msk:  Symmetrical without gross deformities. Normal posture. ?Extremities:  Without edema. ?Neurologic:  Alert and  oriented x4;  grossly normal neurologic

## 2022-03-27 NOTE — Progress Notes (Unsigned)
len ?

## 2022-04-06 ENCOUNTER — Other Ambulatory Visit: Payer: Self-pay | Admitting: Family Medicine

## 2022-04-07 ENCOUNTER — Other Ambulatory Visit: Payer: Self-pay | Admitting: Nurse Practitioner

## 2022-04-07 MED ORDER — AMLODIPINE BESYLATE 5 MG PO TABS: 5.0000 mg | ORAL_TABLET | Freq: Every day | ORAL | 3 refills | Status: DC

## 2022-05-01 ENCOUNTER — Ambulatory Visit (HOSPITAL_BASED_OUTPATIENT_CLINIC_OR_DEPARTMENT_OTHER): Payer: Medicare Other | Admitting: Anesthesiology

## 2022-05-01 ENCOUNTER — Encounter (HOSPITAL_COMMUNITY): Payer: Self-pay

## 2022-05-01 ENCOUNTER — Other Ambulatory Visit: Payer: Self-pay

## 2022-05-01 ENCOUNTER — Encounter (HOSPITAL_COMMUNITY)
Admission: RE | Disposition: A | Discharge status: Home / Self Care | Payer: Self-pay | Source: Home / Self Care | Attending: Internal Medicine

## 2022-05-01 ENCOUNTER — Ambulatory Visit (HOSPITAL_COMMUNITY): Payer: Medicare Other | Admitting: Anesthesiology

## 2022-05-01 ENCOUNTER — Ambulatory Visit (HOSPITAL_COMMUNITY)
Admission: RE | Admit: 2022-05-01 | Discharge: 2022-05-01 | Disposition: A | Discharge status: Home / Self Care | Payer: Medicare Other | Attending: Internal Medicine | Admitting: Internal Medicine

## 2022-05-01 DIAGNOSIS — D649 Anemia, unspecified: Secondary | ICD-10-CM | POA: Diagnosis not present

## 2022-05-01 DIAGNOSIS — J449 Chronic obstructive pulmonary disease, unspecified: Secondary | ICD-10-CM | POA: Diagnosis not present

## 2022-05-01 DIAGNOSIS — K219 Gastro-esophageal reflux disease without esophagitis: Secondary | ICD-10-CM | POA: Insufficient documentation

## 2022-05-01 DIAGNOSIS — I10 Essential (primary) hypertension: Secondary | ICD-10-CM | POA: Insufficient documentation

## 2022-05-01 DIAGNOSIS — G709 Myoneural disorder, unspecified: Secondary | ICD-10-CM | POA: Diagnosis not present

## 2022-05-01 DIAGNOSIS — Z8261 Family history of arthritis: Secondary | ICD-10-CM | POA: Insufficient documentation

## 2022-05-01 DIAGNOSIS — K5730 Diverticulosis of large intestine without perforation or abscess without bleeding: Secondary | ICD-10-CM

## 2022-05-01 DIAGNOSIS — K648 Other hemorrhoids: Secondary | ICD-10-CM | POA: Diagnosis not present

## 2022-05-01 DIAGNOSIS — Z8249 Family history of ischemic heart disease and other diseases of the circulatory system: Secondary | ICD-10-CM | POA: Diagnosis not present

## 2022-05-01 DIAGNOSIS — R195 Other fecal abnormalities: Secondary | ICD-10-CM | POA: Diagnosis not present

## 2022-05-01 DIAGNOSIS — Z79899 Other long term (current) drug therapy: Secondary | ICD-10-CM | POA: Insufficient documentation

## 2022-05-01 DIAGNOSIS — M069 Rheumatoid arthritis, unspecified: Secondary | ICD-10-CM | POA: Diagnosis not present

## 2022-05-01 DIAGNOSIS — M1990 Unspecified osteoarthritis, unspecified site: Secondary | ICD-10-CM | POA: Diagnosis not present

## 2022-05-01 DIAGNOSIS — Z86010 Personal history of colonic polyps: Secondary | ICD-10-CM | POA: Diagnosis not present

## 2022-05-01 DIAGNOSIS — Z1211 Encounter for screening for malignant neoplasm of colon: Secondary | ICD-10-CM | POA: Diagnosis not present

## 2022-05-01 DIAGNOSIS — Z87891 Personal history of nicotine dependence: Secondary | ICD-10-CM | POA: Insufficient documentation

## 2022-05-01 DIAGNOSIS — E039 Hypothyroidism, unspecified: Secondary | ICD-10-CM | POA: Diagnosis not present

## 2022-05-01 DIAGNOSIS — D126 Benign neoplasm of colon, unspecified: Secondary | ICD-10-CM

## 2022-05-01 HISTORY — PX: COLONOSCOPY WITH PROPOFOL: SHX5780

## 2022-05-01 SURGERY — COLONOSCOPY WITH PROPOFOL
Anesthesia: General

## 2022-05-01 MED ORDER — LIDOCAINE HCL (CARDIAC) PF 100 MG/5ML IV SOSY
PREFILLED_SYRINGE | INTRAVENOUS | Status: DC | PRN
  Administered 2022-05-01: 50 mg via INTRAVENOUS

## 2022-05-01 MED ORDER — LACTATED RINGERS IV SOLN: INTRAVENOUS | Status: DC

## 2022-05-01 MED ORDER — PROPOFOL 10 MG/ML IV BOLUS
INTRAVENOUS | Status: DC | PRN
  Administered 2022-05-01: 30 mg via INTRAVENOUS
  Administered 2022-05-01: 50 mg via INTRAVENOUS
  Administered 2022-05-01: 80 mg via INTRAVENOUS

## 2022-05-01 NOTE — Op Note (Signed)
[facility] Patient Name: Bianca Mcguire Procedure Date: 05/01/2022 10:02 AM MRN: [id number] Date of Birth: 01-Nov-1946 Attending MD: Elon Alas. Edgar Frisk CSN: [id number] Age: 76 Admit Type: Outpatient Procedure:                Colonoscopy Indications:              Positive Cologuard test Providers:                Elon Alas. Abbey Chatters, DO, Lambert Mody, Caprice Kluver Referring MD:              Medicines:                See the Anesthesia note for documentation of the                            administered medications Complications:            No immediate complications. Estimated Blood Loss:     Estimated blood loss: none. Procedure:                Pre-Anesthesia Assessment:                           - The anesthesia plan was to use monitored                            anesthesia care (MAC).                           After obtaining informed consent, the colonoscope                            was passed under direct vision. Throughout the                            procedure, the patient's blood pressure, pulse, and                            oxygen saturations were monitored continuously. The                            [id number]-HQ190L ([id number]) scope was introduced through                            the anus and advanced to the the cecum, identified                            by appendiceal orifice and ileocecal valve. The                            colonoscopy was performed without difficulty. The                            patient tolerated the procedure well. The quality                            of the bowel preparation  was evaluated using the                            BBPS [facility] Bowel Preparation Scale) with scores                            of: Right Colon = 3, Transverse Colon = 3 and Left                            Colon = 3 (entire mucosa seen well with no residual                            staining, small fragments of stool or opaque                             liquid). The total BBPS score equals 9. Scope In: 10:12:29 AM Scope Out: 10:22:45 AM Scope Withdrawal Time: 0 hours 6 minutes 1 second  Total Procedure Duration: 0 hours 10 minutes 16 seconds  Findings:      The perianal and digital rectal examinations were normal.      Non-bleeding internal hemorrhoids were found during endoscopy.      Multiple medium-mouthed diverticula were found in the sigmoid colon and       descending colon.      The exam was otherwise without abnormality. Impression:               - Non-bleeding internal hemorrhoids.                           - Diverticulosis in the sigmoid colon and in the                            descending colon.                           - The examination was otherwise normal.                           - No specimens collected. Moderate Sedation:      Per Anesthesia Care Recommendation:           - Patient has a contact number available for                            emergencies. The signs and symptoms of potential                            delayed complications were discussed with the                            patient. Return to normal activities tomorrow.                            Written discharge instructions were provided to the  patient.                           - Resume previous diet.                           - Continue present medications.                           - No repeat colonoscopy due to age.                           - Return to GI clinic PRN. Procedure Code(s):        --- Professional ---                           [phone], Colonoscopy, flexible; diagnostic, including                            collection of specimen(s) by brushing or washing,                            when performed (separate procedure) Diagnosis Code(s):        --- Professional ---                           K64.8, Other hemorrhoids                           R19.5, Other fecal abnormalities                            K57.30, Diverticulosis of large intestine without                            perforation or abscess without bleeding CPT copyright 2019 American Medical Association. All rights reserved. The codes documented in this report are preliminary and upon coder review may  be revised to meet current compliance requirements. Elon Alas. Abbey Chatters, DO [place] Abbey Chatters, DO 05/01/2022 10:25:55 AM This report has been signed electronically. Number of Addenda: 0

## 2022-05-01 NOTE — H&P (Signed)
Primary Care Physician:  Fayrene Helper, MD Primary Gastroenterologist:  Dr. Abbey Chatters  Pre-Procedure History & Physical: HPI:  Bianca Mcguire is a 76 y.o. female is here for a colonoscopy to be performed for positive Cologuard testing.   Past Medical History:  Diagnosis Date   Acute pain of right knee 01/30/2011   Qualifier: Diagnosis of  By: Moshe Cipro MD, Renise     Anemia    Arthritis    Carpal tunnel syndrome, bilateral    Diverticulosis dx 2011   GERD (gastroesophageal reflux disease)    Headache    Hypertension 06/02/2018   Low back pain    Obesity    Rheumatoid arthritis(714.0)     Past Surgical History:  Procedure Laterality Date   bilateral foot surgery     COLONOSCOPY  2011   Dr. Oneida Alar: 71m sessile polyp (tubular adenoma), pancolonic diverticulosis   COLONOSCOPY N/A 09/26/2017   diverticulosis, redundant colon   ESOPHAGOGASTRODUODENOSCOPY N/A 09/16/2017   Dr. FOneida Alar multiple gastric polyps, (fundic gland), mild gastritis, path with duodenal mucosa with focal villous blunting and surface erosions, moderate chronic gastritis with intestinal metaplasia and reactive changes. no H.pylori, dysplasia, or malignancy   FLEXIBLE SIGMOIDOSCOPY N/A 09/16/2017   Procedure: FLEXIBLE SIGMOIDOSCOPY;  Surgeon: FDanie Binder MD;  Location: [facility];  Service: Endoscopy;  Laterality: N/A;   HEMORRHOID SURGERY  1980   KNEE ARTHROSCOPY WITH LATERAL MENISECTOMY Right 10/16/2018   Procedure: KNEE ARTHROSCOPY WITH LATERAL MENISCECTOMY;  Surgeon: HCarole Civil MD;  Location: [facility];  Service: Orthopedics;  Laterality: Right;   OOPHORECTOMY Right    roophorectomy for benign disease  02/2010   Dr. FGlo Herring  tonsillectomy and adenoidctomy in childhood     [place]   Prior to Admission medications   Medication Sig Start Date End Date Taking? Authorizing Provider  acetaminophen (TYLENOL) 500 MG tablet Take 500-1,000 mg by mouth every 6 (six) hours as needed  for headache.   Yes [provider]  alendronate (FOSAMAX) 70 MG tablet TAKE 1 TABLET BY MOUTH  EVERY 7 DAYS WITH A FULL  GLASS OF WATER ON AN EMPTY  STOMACH Patient taking differently: Take 70 mg by mouth every Friday. TAKE 1 TABLET BY MOUTH  EVERY 7 DAYS WITH A FULL  GLASS OF WATER ON AN EMPTY  STOMACH 07/24/21  Yes SFayrene Helper MD  amLODipine (NORVASC) 5 MG tablet Take 1 tablet (5 mg total) by mouth daily. Patient taking differently: Take 5 mg by mouth every evening. 04/07/22  Yes [place], FDewaine Conger FNP  benzonatate (TESSALON) 100 MG capsule Take 1 capsule (100 mg total) by mouth 2 (two) times daily as needed for cough. 02/28/22  Yes SFayrene Helper MD  BIOTIN PO Take 1 tablet by mouth in the morning.   Yes [provider]  budesonide-formoterol (SYMBICORT) 80-4.5 MCG/ACT inhaler Inhale 2 puffs into the lungs 2 (two) times daily as needed (respiratory issues.). 04/12/21  Yes SFayrene Helper MD  Calcium Carb-Cholecalciferol (CALCIUM 600 + D PO) Take 1 tablet by mouth every evening.    Yes [provider]  Cholecalciferol (VITAMIN D3) 50 MCG (2000 UT) TABS Take 2,000 Units by mouth in the morning.   Yes [provider]  cyclobenzaprine (FLEXERIL) 5 MG tablet TAKE 1 TABLET BY MOUTH  TWICE WEEKLY AS NEEDED, FOR BACK SPASM 07/24/21  Yes SFayrene Helper MD  fluticasone (FLONASE) 50 MCG/ACT nasal spray SHAKE LIQUID AND USE 2 SPRAYS IN  EACH NOSTRIL DAILY Patient taking differently: Place 2 sprays into both nostrils daily as needed for allergies. 04/17/21  Yes Fayrene Helper, MD  folic acid (FOLVITE) 144 MCG tablet Take 800 mcg by mouth in the morning.   Yes [provider]  gabapentin (NEURONTIN) 100 MG capsule Take 100 mg by mouth at bedtime. 04/04/22  Yes [provider]  ibuprofen (ADVIL) 200 MG tablet Take 800 mg by mouth every 8 (eight) hours as needed (for pain.).   Yes [provider]  ipratropium-albuterol (DUONEB)  0.5-2.5 (3) MG/3ML SOLN Take 3 mLs by nebulization every 6 (six) hours as needed (wheezing/shortness of breath.). 04/12/21  Yes Fayrene Helper, MD  levothyroxine (SYNTHROID) 75 MCG tablet Take 1 tablet (75 mcg total) by mouth daily. 03/04/22  Yes Fayrene Helper, MD  montelukast (SINGULAIR) 10 MG tablet TAKE 1 TABLET BY MOUTH AT  BEDTIME 07/24/21  Yes Fayrene Helper, MD  Multiple Vitamins-Iron (MULTIVITAMINS WITH IRON) TABS tablet Take 1 tablet by mouth in the morning.   Yes [provider]  omeprazole (PRILOSEC) 20 MG capsule TAKE 1 CAPSULE BY MOUTH  DAILY 1/2 HOUR PRIOR TO  BREAKFAST 07/24/21  Yes Fayrene Helper, MD  ondansetron (ZOFRAN) 4 MG tablet Take 1 tablet (4 mg total) by mouth every 8 (eight) hours as needed for nausea or vomiting. 08/30/21  Yes Fayrene Helper, MD  vitamin B-12 (CYANOCOBALAMIN) 500 MCG tablet Take 500 mcg by mouth in the morning.   Yes [provider]  CLENPIQ 10-3.5-12 MG-GM -GM/175ML SOLN Take by mouth. 04/26/22   [provider]  UNABLE TO FIND Nebulizer machine with tubing and supplies. 04/11/21   Fayrene Helper, MD    Allergies as of 03/27/2022   (No Known Allergies)    Family History  Problem Relation Age of Onset   Heart failure Mother    Cancer Mother 47       oral   Heart failure Father    Arthritis Sister    Heart attack Brother    Thyroid disease Daughter    Hypertension Daughter    Hypertension Daughter    Thyroid disease Daughter    Heart failure Sister    Diabetes Sister    Hypertension Sister    Leukemia Brother    Colon cancer Neg Hx    Colon polyps Neg Hx     Social History   Socioeconomic History   Marital status: Widowed    Spouse name: Not on file   Number of children: 3   Years of education: Not on file   Highest education level: Not on file  Occupational History   Occupation: retired    Comment: part time    Occupation: disabled for arthritis     Comment: since age 43     Employer: [employer]  Tobacco Use   Smoking status: Former    Packs/day: 2.00    Years: 25.00    Pack years: 50.00    Types: Cigarettes    Quit date: 05/12/2014    Years since quitting: 7.9   Smokeless tobacco: Never  Vaping Use   Vaping Use: Never used  Substance and Sexual Activity   Alcohol use: No   Drug use: No   Sexual activity: Not Currently  Other Topics Concern   Not on file  Social History Narrative   Lives with Granddaughter since 2006   Social Determinants of Health   Financial Resource Strain: Low Risk    Difficulty  of Paying Living Expenses: Not hard at all  Food Insecurity: No Food Insecurity   Worried About [place] in the Last Year: Never true   [place] in the Last Year: Never true  Transportation Needs: No Transportation Needs   Lack of Transportation (Medical): No   Lack of Transportation (Non-Medical): No  Physical Activity: Inactive   Days of Exercise per Week: 0 days   Minutes of Exercise per Session: 0 min  Stress: No Stress Concern Present   Feeling of Stress : Not at all  Social Connections: Moderately Isolated   Frequency of Communication with Friends and Family: More than three times a week   Frequency of Social Gatherings with Friends and Family: More than three times a week   Attends Religious Services: More than 4 times per year   Active Member of [employer] or Organizations: No   Attends Archivist Meetings: Never   Marital Status: Widowed  Human resources officer Violence: Not At Risk   Fear of Current or Ex-Partner: No   Emotionally Abused: No   Physically Abused: No   Sexually Abused: No    Review of Systems: See HPI, otherwise negative ROS  Physical Exam: Vital signs in last 24 hours: Temp:  [97.8 F (36.6 C)] 97.8 F (36.6 C) (05/23 0929) Pulse Rate:  [62] 62 (05/23 0929) Resp:  [22] 22 (05/23 0929) BP: (145)/(66) 145/66 (05/23 0929) SpO2:  [92 %] 92 % (05/23 0929)   General:   Alert,  Well-developed,  well-nourished, pleasant and cooperative in NAD Head:  Normocephalic and atraumatic. Eyes:  Sclera clear, no icterus.   Conjunctiva pink. Ears:  Normal auditory acuity. Nose:  No deformity, discharge,  or lesions. Mouth:  No deformity or lesions, dentition normal. Neck:  Supple; no masses or thyromegaly. Lungs:  Clear throughout to auscultation.   No wheezes, crackles, or rhonchi. No acute distress. Heart:  Regular rate and rhythm; no murmurs, clicks, rubs,  or gallops. Abdomen:  Soft, nontender and nondistended. No masses, hepatosplenomegaly or hernias noted. Normal bowel sounds, without guarding, and without rebound.   Msk:  Symmetrical without gross deformities. Normal posture. Extremities:  Without clubbing or edema. Neurologic:  Alert and  oriented x4;  grossly normal neurologically. Skin:  Intact without significant lesions or rashes. Cervical Nodes:  No significant cervical adenopathy. Psych:  Alert and cooperative. Normal mood and affect.  Impression/Plan: Bianca Mcguire is here for a colonoscopy to be performed for positive Cologuard testing.   The risks of the procedure including infection, bleed, or perforation as well as benefits, limitations, alternatives and imponderables have been reviewed with the patient. Questions have been answered. All parties agreeable.

## 2022-05-01 NOTE — Anesthesia Preprocedure Evaluation (Addendum)
Anesthesia Evaluation  Patient identified by MRN, date of birth, ID band Patient awake    Reviewed: Allergy & Precautions, NPO status , Patient's Chart, lab work & pertinent test results  Airway Mallampati: II  TM Distance: >3 FB Neck ROM: Full    Dental  (+) Edentulous Upper, Edentulous Lower   Pulmonary COPD,  COPD inhaler, former smoker,    Pulmonary exam normal breath sounds clear to auscultation       Cardiovascular Exercise Tolerance: Good hypertension, Pt. on medications Normal cardiovascular exam Rhythm:Regular Rate:Normal     Neuro/Psych  Headaches,  Neuromuscular disease negative psych ROS   GI/Hepatic Neg liver ROS, GERD  Medicated and Controlled,  Endo/Other  Hypothyroidism   Renal/GU Renal disease  negative genitourinary   Musculoskeletal  (+) Arthritis , Osteoarthritis and Rheumatoid disorders,    Abdominal   Peds negative pediatric ROS (+)  Hematology  (+) Blood dyscrasia, anemia ,   Anesthesia Other Findings   Reproductive/Obstetrics negative OB ROS                            Anesthesia Physical Anesthesia Plan  ASA: 2  Anesthesia Plan: General   Post-op Pain Management: Minimal or no pain anticipated   Induction: Intravenous  PONV Risk Score and Plan: Propofol infusion  Airway Management Planned: Nasal Cannula and Natural Airway  Additional Equipment:   Intra-op Plan:   Post-operative Plan:   Informed Consent: I have reviewed the patients History and Physical, chart, labs and discussed the procedure including the risks, benefits and alternatives for the proposed anesthesia with the patient or authorized representative who has indicated his/her understanding and acceptance.     Dental advisory given  Plan Discussed with: CRNA and Surgeon  Anesthesia Plan Comments:        Anesthesia Quick Evaluation

## 2022-05-01 NOTE — Transfer of Care (Signed)
Immediate Anesthesia Transfer of Care Note  Patient: Bianca Mcguire  Procedure(s) Performed: COLONOSCOPY WITH PROPOFOL  Patient Location: Endoscopy Unit  Anesthesia Type:General  Level of Consciousness: drowsy  Airway & Oxygen Therapy: Patient Spontanous Breathing  Post-op Assessment: Report given to RN and Post -op Vital signs reviewed and stable  Post vital signs: Reviewed and stable  Last Vitals:  Vitals Value Taken Time  BP    Temp    Pulse 62   Resp 19   SpO2 96%     Last Pain:  Vitals:   05/01/22 1008  TempSrc:   PainSc: 3          Complications: No notable events documented.

## 2022-05-01 NOTE — Anesthesia Postprocedure Evaluation (Signed)
Anesthesia Post Note  Patient: Bianca Mcguire  Procedure(s) Performed: COLONOSCOPY WITH PROPOFOL  Patient location during evaluation: Phase II Anesthesia Type: General Level of consciousness: awake and alert and oriented Pain management: pain level controlled Vital Signs Assessment: post-procedure vital signs reviewed and stable Respiratory status: spontaneous breathing, nonlabored ventilation and respiratory function stable Cardiovascular status: blood pressure returned to baseline and stable Postop Assessment: no apparent nausea or vomiting Anesthetic complications: no   No notable events documented.   Last Vitals:  Vitals:   05/01/22 0929 05/01/22 1024  BP: (!) 145/66 106/60  Pulse: 62   Resp: (!) 22 19  Temp: 36.6 C 36.5 C  SpO2: 92% 96%    Last Pain:  Vitals:   05/01/22 1024  TempSrc: Oral  PainSc: 0-No pain                 Ichael Pullara C Rolla Kedzierski

## 2022-05-01 NOTE — Discharge Instructions (Addendum)
  Colonoscopy Discharge Instructions  Read the instructions outlined below and refer to this sheet in the next few weeks. These discharge instructions provide you with general information on caring for yourself after you leave the hospital. Your doctor may also give you specific instructions. While your treatment has been planned according to the most current medical practices available, unavoidable complications occasionally occur.   ACTIVITY You may resume your regular activity, but move at a slower pace for the next 24 hours.  Take frequent rest periods for the next 24 hours.  Walking will help get rid of the air and reduce the bloated feeling in your belly (abdomen).  No driving for 24 hours (because of the medicine (anesthesia) used during the test).   Do not sign any important legal documents or operate any machinery for 24 hours (because of the anesthesia used during the test).  NUTRITION Drink plenty of fluids.  You may resume your normal diet as instructed by your doctor.  Begin with a light meal and progress to your normal diet. Heavy or fried foods are harder to digest and may make you feel sick to your stomach (nauseated).  Avoid alcoholic beverages for 24 hours or as instructed.  MEDICATIONS You may resume your normal medications unless your doctor tells you otherwise.  WHAT YOU CAN EXPECT TODAY Some feelings of bloating in the abdomen.  Passage of more gas than usual.  Spotting of blood in your stool or on the toilet paper.  IF YOU HAD POLYPS REMOVED DURING THE COLONOSCOPY: No aspirin products for 7 days or as instructed.  No alcohol for 7 days or as instructed.  Eat a soft diet for the next 24 hours.  FINDING OUT THE RESULTS OF YOUR TEST Not all test results are available during your visit. If your test results are not back during the visit, make an appointment with your caregiver to find out the results. Do not assume everything is normal if you have not heard from your  caregiver or the medical facility. It is important for you to follow up on all of your test results.  SEEK IMMEDIATE MEDICAL ATTENTION IF: You have more than a spotting of blood in your stool.  Your belly is swollen (abdominal distention).  You are nauseated or vomiting.  You have a temperature over 101.  You have abdominal pain or discomfort that is severe or gets worse throughout the day.   Your colonoscopy was relatively unremarkable.  I did not find any polyps or evidence of colon cancer.  I recommend repeating colonoscopy in 10 years for colon cancer screening purposes if benefits outweigh risks.  You do have diverticulosis and internal hemorrhoids. I would recommend increasing fiber in your diet or adding OTC Benefiber/Metamucil. Be sure to drink at least 4 to 6 glasses of water daily. Follow-up with GI as needed.   I hope you have a great rest of your week!  Elon Alas. Abbey Chatters, D.O. Gastroenterology and Hepatology [facility]

## 2022-05-04 ENCOUNTER — Ambulatory Visit (HOSPITAL_COMMUNITY)
Admission: RE | Admit: 2022-05-04 | Discharge: 2022-05-04 | Disposition: A | Discharge status: Home / Self Care | Payer: Medicare Other | Source: Ambulatory Visit | Attending: Family Medicine | Admitting: Family Medicine

## 2022-05-04 DIAGNOSIS — Z1231 Encounter for screening mammogram for malignant neoplasm of breast: Secondary | ICD-10-CM | POA: Diagnosis not present

## 2022-05-06 ENCOUNTER — Other Ambulatory Visit: Payer: Self-pay | Admitting: Family Medicine

## 2022-05-08 ENCOUNTER — Encounter (HOSPITAL_COMMUNITY): Payer: Self-pay | Admitting: Internal Medicine

## 2022-05-26 ENCOUNTER — Other Ambulatory Visit: Payer: Self-pay | Admitting: Family Medicine

## 2022-05-30 ENCOUNTER — Other Ambulatory Visit: Payer: Self-pay | Admitting: Family Medicine

## 2022-06-08 ENCOUNTER — Other Ambulatory Visit: Payer: Self-pay | Admitting: Family Medicine

## 2022-07-23 ENCOUNTER — Ambulatory Visit: Payer: Medicare Other | Admitting: Orthopedic Surgery

## 2022-07-23 ENCOUNTER — Encounter: Payer: Self-pay | Admitting: Orthopedic Surgery

## 2022-07-23 ENCOUNTER — Ambulatory Visit (INDEPENDENT_AMBULATORY_CARE_PROVIDER_SITE_OTHER): Payer: Medicare Other

## 2022-07-23 DIAGNOSIS — M170 Bilateral primary osteoarthritis of knee: Secondary | ICD-10-CM

## 2022-07-23 DIAGNOSIS — G8929 Other chronic pain: Secondary | ICD-10-CM

## 2022-07-23 DIAGNOSIS — M25561 Pain in right knee: Secondary | ICD-10-CM

## 2022-07-23 DIAGNOSIS — M0579 Rheumatoid arthritis with rheumatoid factor of multiple sites without organ or systems involvement: Secondary | ICD-10-CM

## 2022-07-23 DIAGNOSIS — M25562 Pain in left knee: Secondary | ICD-10-CM

## 2022-07-23 MED ORDER — METHYLPREDNISOLONE ACETATE 40 MG/ML IJ SUSP
40.0000 mg | Freq: Once | INTRAMUSCULAR | Status: AC
  Administered 2022-07-23: 40 mg via INTRA_ARTICULAR

## 2022-07-23 NOTE — Progress Notes (Signed)
Chief Complaint  Patient presents with   Knee Pain    Bilateral, having a hard time getting up from seated position, having trouble standing for long periods of time    HPI: Bianca Mcguire is 76 year old female with rheumatoid arthritis status post knee arthroscopy in 2019 presents with bilateral knee pain difficulty getting up from seated position and pain after standing for long periods of time  She has rheumatoid arthritis but is not seeing a rheumatologist  Apparently a referral was made and appointment was canceled    Past Medical History:  Diagnosis Date   Acute pain of right knee 01/30/2011   Qualifier: Diagnosis of  By: Moshe Cipro MD, Carlene     Anemia    Arthritis    Carpal tunnel syndrome, bilateral    Diverticulosis dx 2011   GERD (gastroesophageal reflux disease)    Headache    Hypertension 06/02/2018   Low back pain    Obesity    Rheumatoid arthritis(714.0)        General appearance: Well-developed well-nourished no gross deformities  Cardiovascular normal pulse and perfusion normal color without edema  Neurologically no sensation loss or deficits or pathologic reflexes  Psychological: Awake alert and oriented x3 mood and affect normal  Skin no lacerations or ulcerations no nodularity no palpable masses, no erythema or nodularity  Musculoskeletal: Bilateral upper extremity ulnar drift and thumb deformity consistent with RA  Small joint effusions both knees which are not warm to touch skin is intact no rash range of motion is preserved and 120 degrees ligament stability is confirmed  Muscle tone is normal  Imaging x-rays show arthritis both right and left knee both knees are in valgus left knee and right knee both have effusions no significant osteophytes there is some osteopenia  A/P  Encounter Diagnoses  Name Primary?   Chronic pain of both knees Yes   Rheumatoid arthritis involving multiple sites with positive rheumatoid factor ([place])      76 year old  female with RA never really evaluated or seen by rheumatology  Patient's knees are starting to wear out on her  Patient needs medical management of her RA prior to any surgical intervention  Recommend bilateral knee injections for temporary relief   Procedure note for bilateral knee injections  Procedure note left knee injection verbal consent was obtained to inject left knee joint  Timeout was completed to confirm the site of injection  The medications used were 40 mg depomedrol and 3 cc of 1% lidocaine  Anesthesia was provided by ethyl chloride and the skin was prepped with alcohol.  After cleaning the skin with alcohol a 20-gauge needle was used to inject the left knee joint. There were no complications. A sterile bandage was applied.   Procedure note right knee injection verbal consent was obtained to inject right knee joint  Timeout was completed to confirm the site of injection  The medications used were 40 mg depomedrol and 3 cc of 1% lidocaine  Anesthesia was provided by ethyl chloride and the skin was prepped with alcohol.  After cleaning the skin with alcohol a 20-gauge needle was used to inject the right knee joint. There were no complications. A sterile bandage was applied.   3 months fu if seen by rheum

## 2022-07-23 NOTE — Patient Instructions (Addendum)
Phone: [phone] call Dr Trudie Reed to Reschedule appointment you cancelled with her.   You have received an injection of steroids into the joint. 15% of patients will have increased pain within the 24 hours postinjection.   This is transient and will go away.   We recommend that you use ice packs on the injection site for 20 minutes every 2 hours and extra strength Tylenol 2 tablets every 8 as needed until the pain resolves.  If you continue to have pain after taking the Tylenol and using the ice please call the office for further instructions.

## 2022-07-23 NOTE — Progress Notes (Addendum)
Social History   Tobacco Use   Smoking status: Former    Packs/day: 2.00    Years: 25.00    Total pack years: 50.00    Types: Cigarettes    Quit date: 05/12/2014    Years since quitting: 8.2   Smokeless tobacco: Never  Vaping Use   Vaping Use: Never used  Substance Use Topics   Alcohol use: No   Drug use: No

## 2022-09-06 ENCOUNTER — Ambulatory Visit (INDEPENDENT_AMBULATORY_CARE_PROVIDER_SITE_OTHER): Payer: Medicare Other | Admitting: Nurse Practitioner

## 2022-09-06 ENCOUNTER — Encounter: Payer: Self-pay | Admitting: Nurse Practitioner

## 2022-09-06 DIAGNOSIS — Z0000 Encounter for general adult medical examination without abnormal findings: Secondary | ICD-10-CM

## 2022-09-06 NOTE — Progress Notes (Signed)
I connected with  [name] on 09/06/22 by a audio enabled telemedicine application and verified that I am speaking with the correct person using two identifiers.  Patient Location: Home  Provider Location: Home Office  I discussed the limitations of evaluation and management by telemedicine. The patient expressed understanding and agreed to proceed.  Subjective:   Bianca Mcguire is a 76 y.o. female who presents for Medicare Annual (Subsequent) preventive examination.  Review of Systems           Objective:    There were no vitals filed for this visit. There is no height or weight on file to calculate BMI.     05/01/2022    9:16 AM 08/09/2021    4:16 PM 03/31/2021   11:59 AM 07/22/2020    8:23 AM 08/04/2019    5:34 PM 10/16/2018    6:19 AM 10/13/2018    9:02 AM  Advanced Directives  Does Patient Have a Medical Advance Directive? No No No No No No No  Would patient like information on creating a medical advance directive? No - Patient declined  No - Patient declined No - Patient declined  No - Patient declined No - Patient declined    Current Medications (verified) Outpatient Encounter Medications as of 09/06/2022  Medication Sig   acetaminophen (TYLENOL) 500 MG tablet Take 500-1,000 mg by mouth every 6 (six) hours as needed for headache.   alendronate (FOSAMAX) 70 MG tablet TAKE 1 TABLET BY MOUTH  WEEKLY WITH 8 OZ OF PLAIN  WATER 30 MINUTES BEFORE  FIRST FOOD, DRINK OR MEDS.  STAY UPRIGHT FOR 30 MINS   amLODipine (NORVASC) 5 MG tablet TAKE 1 TABLET BY MOUTH  DAILY   benzonatate (TESSALON) 100 MG capsule Take 1 capsule (100 mg total) by mouth 2 (two) times daily as needed for cough.   BIOTIN PO Take 1 tablet by mouth in the morning.   budesonide-formoterol (SYMBICORT) 80-4.5 MCG/ACT inhaler Inhale 2 puffs into the lungs 2 (two) times daily as needed (respiratory issues.).   Calcium Carb-Cholecalciferol (CALCIUM 600 + D PO) Take 1 tablet by mouth every evening.     Cholecalciferol (VITAMIN D3) 50 MCG (2000 UT) TABS Take 2,000 Units by mouth in the morning.   CLENPIQ 10-3.5-12 MG-GM -GM/175ML SOLN Take by mouth.   cyclobenzaprine (FLEXERIL) 5 MG tablet TAKE 1 TABLET BY MOUTH  TWICE WEEKLY AS NEEDED, FOR BACK SPASM   fluticasone (FLONASE) 50 MCG/ACT nasal spray SHAKE LIQUID AND USE 2 SPRAYS IN EACH NOSTRIL DAILY (Patient taking differently: Place 2 sprays into both nostrils daily as needed for allergies.)   folic acid (FOLVITE) 202 MCG tablet Take 800 mcg by mouth in the morning.   gabapentin (NEURONTIN) 100 MG capsule TAKE 1 CAPSULE BY MOUTH AT  BEDTIME   ibuprofen (ADVIL) 200 MG tablet Take 800 mg by mouth every 8 (eight) hours as needed (for pain.).   ipratropium-albuterol (DUONEB) 0.5-2.5 (3) MG/3ML SOLN Take 3 mLs by nebulization every 6 (six) hours as needed (wheezing/shortness of breath.).   levothyroxine (SYNTHROID) 75 MCG tablet Take 1 tablet (75 mcg total) by mouth daily.   montelukast (SINGULAIR) 10 MG tablet TAKE 1 TABLET BY MOUTH AT  BEDTIME   Multiple Vitamins-Iron (MULTIVITAMINS WITH IRON) TABS tablet Take 1 tablet by mouth in the morning.   omeprazole (PRILOSEC) 20 MG capsule TAKE 1 CAPSULE BY MOUTH  DAILY 1/2 HOUR PRIOR TO  BREAKFAST   ondansetron (ZOFRAN) 4 MG tablet Take 1 tablet (4  mg total) by mouth every 8 (eight) hours as needed for nausea or vomiting.   UNABLE TO FIND Nebulizer machine with tubing and supplies.   vitamin B-12 (CYANOCOBALAMIN) 500 MCG tablet Take 500 mcg by mouth in the morning.   No facility-administered encounter medications on file as of 09/06/2022.    Allergies (verified) Patient has no known allergies.   History: Past Medical History:  Diagnosis Date   Acute pain of right knee 01/30/2011   Qualifier: Diagnosis of  By: Moshe Cipro MD, Mada     Anemia    Arthritis    Carpal tunnel syndrome, bilateral    Diverticulosis dx 2011   GERD (gastroesophageal reflux disease)    Headache    Hypertension 06/02/2018    Low back pain    Obesity    Rheumatoid arthritis(714.0)    Past Surgical History:  Procedure Laterality Date   bilateral foot surgery     COLONOSCOPY  2011   Dr. Oneida Alar: 67m sessile polyp (tubular adenoma), pancolonic diverticulosis   COLONOSCOPY N/A 09/26/2017   diverticulosis, redundant colon   COLONOSCOPY WITH PROPOFOL N/A 05/01/2022   Procedure: COLONOSCOPY WITH PROPOFOL;  Surgeon: CEloise Harman DO;  Location: [facility];  Service: Endoscopy;  Laterality: N/A;  11:30am   ESOPHAGOGASTRODUODENOSCOPY N/A 09/16/2017   Dr. FOneida Alar multiple gastric polyps, (fundic gland), mild gastritis, path with duodenal mucosa with focal villous blunting and surface erosions, moderate chronic gastritis with intestinal metaplasia and reactive changes. no H.pylori, dysplasia, or malignancy   FLEXIBLE SIGMOIDOSCOPY N/A 09/16/2017   Procedure: FLEXIBLE SIGMOIDOSCOPY;  Surgeon: FDanie Binder MD;  Location: [facility];  Service: Endoscopy;  Laterality: N/A;   HEMORRHOID SURGERY  1980   KNEE ARTHROSCOPY WITH LATERAL MENISECTOMY Right 10/16/2018   Procedure: KNEE ARTHROSCOPY WITH LATERAL MENISCECTOMY;  Surgeon: HCarole Civil MD;  Location: [facility];  Service: Orthopedics;  Laterality: Right;   OOPHORECTOMY Right    roophorectomy for benign disease  02/2010   Dr. FGlo Herring  tonsillectomy and adenoidctomy in childhood     TUBAL LIGATION  1977   Family History  Problem Relation Age of Onset   Heart failure Mother    Cancer Mother 656      oral   Heart failure Father    Arthritis Sister    Heart attack Brother    Thyroid disease Daughter    Hypertension Daughter    Hypertension Daughter    Thyroid disease Daughter    Heart failure Sister    Diabetes Sister    Hypertension Sister    Leukemia Brother    Colon cancer Neg Hx    Colon polyps Neg Hx    Social History   Socioeconomic History   Marital status: Widowed    Spouse name: Not on file   Number of children: 3   Years of  education: Not on file   Highest education level: Not on file  Occupational History   Occupation: retired    Comment: part time    Occupation: disabled for arthritis     Comment: since age 76   Employer: [employer]  Tobacco Use   Smoking status: Former    Packs/day: 2.00    Years: 25.00    Total pack years: 50.00    Types: Cigarettes    Quit date: 05/12/2014    Years since quitting: 8.3   Smokeless tobacco: Never  Vaping Use   Vaping Use: Never used  Substance and Sexual Activity   Alcohol  use: No   Drug use: No   Sexual activity: Not Currently  Other Topics Concern   Not on file  Social History Narrative   Lives with Granddaughter since 2006   Social Determinants of Health   Financial Resource Strain: Low Risk  (08/09/2021)   Overall Financial Resource Strain (CARDIA)    Difficulty of Paying Living Expenses: Not hard at all  Food Insecurity: No Food Insecurity (08/09/2021)   Hunger Vital Sign    Worried About Running Out of Food in the Last Year: Never true    Ran Out of Food in the Last Year: Never true  Transportation Needs: No Transportation Needs (08/09/2021)   PRAPARE - Hydrologist (Medical): No    Lack of Transportation (Non-Medical): No  Physical Activity: Inactive (08/09/2021)   Exercise Vital Sign    Days of Exercise per Week: 0 days    Minutes of Exercise per Session: 0 min  Stress: No Stress Concern Present (08/09/2021)   [place]    Feeling of Stress : Not at all  Social Connections: Moderately Isolated (08/09/2021)   Social Connection and Isolation Panel [NHANES]    Frequency of Communication with Friends and Family: More than three times a week    Frequency of Social Gatherings with Friends and Family: More than three times a week    Attends Religious Services: More than 4 times per year    Active Member of [employer] or Organizations: No    Attends Theatre manager Meetings: Never    Marital Status: Widowed    Tobacco Counseling Counseling given: Not Answered   Clinical Intake:                 Diabetic?no          Activities of Daily Living     No data to display          Patient Care Team: Fayrene Helper, MD as PCP - [facility], Marga Melnick, MD (Inactive) as Consulting Physician (Gastroenterology)  Indicate any recent Medical Services you may have received from other than [facility] providers in the past year (date may be approximate).     Assessment:   This is a routine wellness examination for [facility].  Hearing/Vision screen No results found.  Dietary issues and exercise activities discussed:     Goals Addressed   None    Depression Screen    03/15/2022    9:25 AM 02/28/2022    8:11 AM 10/26/2021    9:45 AM 08/09/2021    4:15 PM 07/22/2020    8:24 AM 08/12/2019    8:18 AM 04/09/2019    9:23 AM  PHQ 2/9 Scores  PHQ - 2 Score 0 0 0 0 0 0 0  PHQ- 9 Score   0        Fall Risk    03/15/2022    9:25 AM 02/28/2022    8:11 AM 10/26/2021    9:45 AM 08/30/2021    2:05 PM 08/09/2021    4:16 PM  Fall Risk   Falls in the past year? 0 0 0 0 0  Number falls in past yr: 0 0 0 0 0  Injury with Fall? 0 0 0 0 0  Risk for fall due to : No Fall Risks No Fall Risks   No Fall Risks  Follow up Falls evaluation completed Falls evaluation completed   Falls evaluation completed  FALL RISK PREVENTION PERTAINING TO THE HOME:  Any stairs in or around the home? Yes  If so, are there any without handrails? No  Home free of loose throw rugs in walkways, pet beds, electrical cords, etc? Yes  Adequate lighting in your home to reduce risk of falls? Yes   ASSISTIVE DEVICES UTILIZED TO PREVENT FALLS:  Life alert? No  Use of a cane, walker or w/c? Yes  Grab bars in the bathroom? Yes  Shower chair or bench in shower? Yes  Elevated toilet seat or a handicapped toilet? Yes   TIMED UP AND GO:           08/09/2021    4:17 PM 07/22/2020    8:25 AM 04/09/2019    9:25 AM 01/07/2017    8:52 AM  6CIT Screen  What Year? 0 points 0 points 0 points   What month? 0 points 0 points 0 points 0 points  What time? 0 points 0 points 0 points 0 points  Count back from 20 0 points 0 points 0 points 0 points  Months in reverse 0 points 0 points 0 points 0 points  Repeat phrase 0 points 2 points 0 points 0 points  Total Score 0 points 2 points 0 points     Immunizations Immunization History  Administered Date(s) Administered   Fluad Quad(high Dose 65+) 08/24/2019, 10/26/2021   Influenza Whole 09/23/2008   Influenza,inj,Quad PF,6+ Mos 08/28/2013, 01/07/2017, 08/04/2018   Moderna Sars-Covid-2 Vaccination 02/08/2020, 03/07/2020   PPD Test 08/28/2013   Pneumococcal Conjugate-13 07/05/2014   Pneumococcal Polysaccharide-23 09/07/2008, 09/15/2013    TDAP status: Due, Education has been provided regarding the importance of this vaccine. Advised may receive this vaccine at local pharmacy or Health Dept. Aware to provide a copy of the vaccination record if obtained from local pharmacy or Health Dept. Verbalized acceptance and understanding.  Flu Vaccine status: Due, Education has been provided regarding the importance of this vaccine. Advised may receive this vaccine at local pharmacy or Health Dept. Aware to provide a copy of the vaccination record if obtained from local pharmacy or Health Dept. Verbalized acceptance and understanding.  Pneumococcal vaccine status: Up to date  Covid-19 vaccine status: Information provided on how to obtain vaccines.   Qualifies for Shingles Vaccine? Yes   Zostavax completed  Shingrix Completed?: No.    Education has been provided regarding the importance of this vaccine. Patient has been advised to call insurance company to determine out of pocket expense if they have not yet received this vaccine. Advised may also receive vaccine at local pharmacy or Health Dept. Verbalized  acceptance and understanding.  Screening Tests Health Maintenance  Topic Date Due   TETANUS/TDAP  Never done   Zoster Vaccines- Shingrix (1 of 2) Never done   COVID-19 Vaccine (3 - Moderna series) 05/02/2020   INFLUENZA VACCINE  07/10/2022   COLONOSCOPY (Pts 45-49yr Insurance coverage will need to be confirmed)  05/02/2027   Pneumonia Vaccine 76 Years old  Completed   DEXA SCAN  Completed   Hepatitis C Screening  Completed   HPV VACCINES  Aged Out    Health Maintenance  Health Maintenance Due  Topic Date Due   TETANUS/TDAP  Never done   Zoster Vaccines- Shingrix (1 of 2) Never done   COVID-19 Vaccine (3 - Moderna series) 05/02/2020   INFLUENZA VACCINE  07/10/2022   Colonoscopy up to date next due 2028. Completed DEXA scan ,  Up to date with mammogram  Lung Cancer Screening: (Low Dose CT Chest recommended if Age 35-80 years, 30 pack-year currently smoking OR have quit w/in 15years.) does qualify.   Lung Cancer Screening Referral: referral pending   Additional Screening:  Hepatitis C Screening: does qualify; Completed   Vision Screening: Recommended annual ophthalmology exams for early detection of glaucoma and other disorders of the eye. Is the patient up to date with their annual eye exam?  Yes  Who is the provider or what is the name of the office in which the patient attends annual eye exams? [facility] in [place]  If pt is not established with a provider, would they like to be referred to a provider to establish care?   Dental Screening: Recommended annual dental exams for proper oral hygiene  Community Resource Referral / Chronic Care Management: CRR required this visit?  No   CCM required this visit?  No      Plan:     I have personally reviewed and noted the following in the patient's chart:   Medical and social history Use of alcohol, tobacco or illicit drugs  Current medications and supplements including opioid prescriptions. Patient is not  currently taking opioid prescriptions. Functional ability and status Nutritional status Physical activity Advanced directives List of other physicians Hospitalizations, surgeries, and ER visits in previous 12 months Vitals Screenings to include cognitive, depression, and falls Referrals and appointments  In addition, I have reviewed and discussed with patient certain preventive protocols, quality metrics, and best practice recommendations. A written personalized care plan for preventive services as well as general preventive health recommendations were provided to patient.     Renee Rival, FNP   09/06/2022   Nurse Notes:

## 2022-09-06 NOTE — Patient Instructions (Addendum)
  Ms. Forrester , Thank you for taking time to come for your Medicare Wellness Visit. I appreciate your ongoing commitment to your health goals. Please review the following plan we discussed and let me know if I can assist you in the future.   These are the goals we discussed:  Please get the advanced directive form from the clinic at your next appointment   Goals      Exercise 3x per week (30 min per time)     Patient would like to start exercising 3 times a week for at least 30 minutes at a time.        This is a list of the screening recommended for you and due dates:  Health Maintenance  Topic Date Due   Tetanus Vaccine  Never done   Zoster (Shingles) Vaccine (1 of 2) Never done   COVID-19 Vaccine (3 - Moderna series) 05/02/2020   Flu Shot  07/10/2022   Colon Cancer Screening  05/02/2027   Pneumonia Vaccine  Completed   DEXA scan (bone density measurement)  Completed   Hepatitis C Screening: USPSTF Recommendation to screen - Ages 49-79 yo.  Completed   HPV Vaccine  Aged Out

## 2022-09-07 NOTE — Addendum Note (Signed)
Addended by: Eual Fines on: 09/07/2022 01:28 PM   Modules accepted: Orders, Level of Service

## 2022-09-17 DIAGNOSIS — M25561 Pain in right knee: Secondary | ICD-10-CM | POA: Diagnosis not present

## 2022-09-17 DIAGNOSIS — M0579 Rheumatoid arthritis with rheumatoid factor of multiple sites without organ or systems involvement: Secondary | ICD-10-CM | POA: Diagnosis not present

## 2022-09-17 DIAGNOSIS — M79641 Pain in right hand: Secondary | ICD-10-CM | POA: Diagnosis not present

## 2022-09-17 DIAGNOSIS — M1991 Primary osteoarthritis, unspecified site: Secondary | ICD-10-CM | POA: Diagnosis not present

## 2022-09-17 DIAGNOSIS — R5383 Other fatigue: Secondary | ICD-10-CM | POA: Diagnosis not present

## 2022-09-17 DIAGNOSIS — M79642 Pain in left hand: Secondary | ICD-10-CM | POA: Diagnosis not present

## 2022-09-17 DIAGNOSIS — M25562 Pain in left knee: Secondary | ICD-10-CM | POA: Diagnosis not present

## 2022-10-04 DIAGNOSIS — M1991 Primary osteoarthritis, unspecified site: Secondary | ICD-10-CM | POA: Diagnosis not present

## 2022-10-04 DIAGNOSIS — M0579 Rheumatoid arthritis with rheumatoid factor of multiple sites without organ or systems involvement: Secondary | ICD-10-CM | POA: Diagnosis not present

## 2022-10-04 DIAGNOSIS — R768 Other specified abnormal immunological findings in serum: Secondary | ICD-10-CM | POA: Diagnosis not present

## 2022-10-31 ENCOUNTER — Encounter: Payer: Medicare Other | Admitting: Family Medicine

## 2022-11-14 ENCOUNTER — Other Ambulatory Visit: Payer: Self-pay | Admitting: Family Medicine

## 2023-01-30 ENCOUNTER — Ambulatory Visit (INDEPENDENT_AMBULATORY_CARE_PROVIDER_SITE_OTHER): Payer: 59 | Admitting: Internal Medicine

## 2023-01-30 ENCOUNTER — Encounter: Payer: Self-pay | Admitting: Internal Medicine

## 2023-01-30 ENCOUNTER — Other Ambulatory Visit: Payer: Self-pay | Admitting: Internal Medicine

## 2023-01-30 VITALS — BP 164/82 | HR 77 | Ht 62.0 in | Wt 151.8 lb

## 2023-01-30 DIAGNOSIS — J441 Chronic obstructive pulmonary disease with (acute) exacerbation: Secondary | ICD-10-CM | POA: Diagnosis not present

## 2023-01-30 MED ORDER — AZITHROMYCIN 250 MG PO TABS: ORAL_TABLET | ORAL | 0 refills | Status: DC

## 2023-01-30 MED ORDER — PREDNISONE 20 MG PO TABS: 40.0000 mg | ORAL_TABLET | Freq: Every day | ORAL | 0 refills | Status: AC

## 2023-01-30 MED ORDER — IPRATROPIUM-ALBUTEROL 0.5-2.5 (3) MG/3ML IN SOLN: 3.0000 mL | Freq: Four times a day (QID) | RESPIRATORY_TRACT | 0 refills | Status: DC | PRN

## 2023-01-30 MED ORDER — BUDESONIDE-FORMOTEROL FUMARATE 80-4.5 MCG/ACT IN AERO: 2.0000 | INHALATION_SPRAY | Freq: Two times a day (BID) | RESPIRATORY_TRACT | 3 refills | Status: DC | PRN

## 2023-01-30 NOTE — Patient Instructions (Signed)
It was a pleasure to see you today.  Thank you for giving [place] the opportunity to be involved in your care.  Below is a brief recap of your visit and next steps.  We will plan to see you again in 1 month.  Summary I have prescribed prednisone and azithromycin x 5 days for COPD exacerbation I have also refilled your inhalers. I strongly recommend using your inhalers as prescribed to prevent future exacerbations. Follow up with Dr. Moshe Cipro in 1 month

## 2023-01-30 NOTE — Progress Notes (Signed)
Acute Office Visit  Subjective:     Patient ID: DYMPLE GATICA, female    DOB: [birth year], 77 y.o.   MRN: [id number]  Chief Complaint  Patient presents with   Cough    Since 01/26/2023 and has now lost voice. Not able to sleep much.   Bianca Mcguire presents for an acute visit today endorsing a 5-day history of cough with yellow sputum production, shortness of breath, and has now lost her voice.  Her symptoms began on Saturday (2/17).  She has tried taking NyQuil for symptom relief with minimal effectiveness.  Of note, she has a history of COPD and has not been using her inhalers recently because she did not feel as though she needed them.  Review of Systems  HENT:  Positive for sore throat (loss of voice).   Respiratory:  Positive for cough, sputum production, shortness of breath and wheezing.   All other systems reviewed and are negative.     Objective:    BP (!) 164/82   Pulse 77   Ht 5' 2"$  (1.575 m)   Wt 151 lb 12.8 oz (68.9 kg)   SpO2 90%   BMI 27.76 kg/m   Physical Exam Vitals reviewed.  Constitutional:      Appearance: Normal appearance.  HENT:     Head: Normocephalic and atraumatic.     Nose: Nose normal.     Mouth/Throat:     Mouth: Mucous membranes are moist.     Pharynx: Oropharynx is clear. No oropharyngeal exudate or posterior oropharyngeal erythema.  Cardiovascular:     Rate and Rhythm: Normal rate and regular rhythm.     Pulses: Normal pulses.     Heart sounds: Normal heart sounds. No murmur heard. Pulmonary:     Breath sounds: Wheezing (Diffuse bilateral wheezes) and rhonchi (Left basilar rhonchi) present.  Musculoskeletal:     Cervical back: Normal range of motion.  Neurological:     Mental Status: She is alert.       Assessment & Plan:   Problem List Items Addressed This Visit       COPD with acute exacerbation ([place]) - Primary    Presenting today for a 5-day history of the symptoms endorsed above.  Her symptoms seem most consistent with a  COPD exacerbation.  She has not been using her inhalers recently because she did not believe that she needed them.  On exam she has diffuse bilateral wheezes and left basilar rhonchi.  Her cough has been productive of yellow sputum. -I have prescribed prednisone 40 mg x 5 days as well as a Z-Pak for treatment of COPD exacerbation -Her inhalers have been refilled today.  She was counseled on the necessity to use her inhalers as prescribed in order to prevent future exacerbations. -She will return to care in 1 month for routine follow-up with Dr. Moshe Cipro.       Meds ordered this encounter  Medications   budesonide-formoterol (SYMBICORT) 80-4.5 MCG/ACT inhaler    Sig: Inhale 2 puffs into the lungs 2 (two) times daily as needed (respiratory issues.).    Dispense:  1 each    Refill:  3    Dispense as written. generic   ipratropium-albuterol (DUONEB) 0.5-2.5 (3) MG/3ML SOLN    Sig: Take 3 mLs by nebulization every 6 (six) hours as needed (wheezing/shortness of breath.).    Dispense:  360 mL    Refill:  0   predniSONE (DELTASONE) 20 MG tablet    Sig: Take 2  tablets (40 mg total) by mouth daily for 5 days.    Dispense:  10 tablet    Refill:  0   azithromycin (ZITHROMAX Z-PAK) 250 MG tablet    Sig: Take 2 tablets (500 mg) PO today, then 1 tablet (250 mg) PO daily x4 days.    Dispense:  6 tablet    Refill:  0    Return in about 1 month (around 02/28/2023).  Johnette Abraham, MD

## 2023-01-30 NOTE — Assessment & Plan Note (Signed)
Presenting today for a 5-day history of the symptoms endorsed above.  Her symptoms seem most consistent with a COPD exacerbation.  She has not been using her inhalers recently because she did not believe that she needed them.  On exam she has diffuse bilateral wheezes and left basilar rhonchi.  Her cough has been productive of yellow sputum. -I have prescribed prednisone 40 mg x 5 days as well as a Z-Pak for treatment of COPD exacerbation -Her inhalers have been refilled today.  She was counseled on the necessity to use her inhalers as prescribed in order to prevent future exacerbations. -She will return to care in 1 month for routine follow-up with Dr. Moshe Cipro.

## 2023-02-05 ENCOUNTER — Telehealth: Payer: Self-pay | Admitting: Internal Medicine

## 2023-02-05 NOTE — Telephone Encounter (Signed)
Patient called called there was no cough medicine called into her pharmacy last week from her visit.  Pharmacy : [facility]

## 2023-02-06 MED ORDER — BENZONATATE 100 MG PO CAPS: 100.0000 mg | ORAL_CAPSULE | Freq: Two times a day (BID) | ORAL | 2 refills | Status: DC | PRN

## 2023-02-06 NOTE — Addendum Note (Signed)
Addended by: Johnette Abraham on: 02/06/2023 04:52 PM   Modules accepted: Orders

## 2023-02-07 ENCOUNTER — Encounter: Payer: Self-pay | Admitting: Radiology

## 2023-02-07 NOTE — Telephone Encounter (Signed)
Patient aware.

## 2023-02-27 ENCOUNTER — Other Ambulatory Visit: Payer: Self-pay | Admitting: Family Medicine

## 2023-03-01 ENCOUNTER — Ambulatory Visit (HOSPITAL_COMMUNITY)
Admission: RE | Admit: 2023-03-01 | Discharge: 2023-03-01 | Disposition: A | Discharge status: Home / Self Care | Payer: 59 | Source: Ambulatory Visit | Attending: Family Medicine | Admitting: Family Medicine

## 2023-03-01 ENCOUNTER — Encounter: Payer: Self-pay | Admitting: Family Medicine

## 2023-03-01 ENCOUNTER — Ambulatory Visit (INDEPENDENT_AMBULATORY_CARE_PROVIDER_SITE_OTHER): Payer: 59 | Admitting: Family Medicine

## 2023-03-01 VITALS — BP 128/82 | HR 71 | Ht 62.0 in | Wt 154.1 lb

## 2023-03-01 DIAGNOSIS — J418 Mixed simple and mucopurulent chronic bronchitis: Secondary | ICD-10-CM

## 2023-03-01 DIAGNOSIS — Z1231 Encounter for screening mammogram for malignant neoplasm of breast: Secondary | ICD-10-CM

## 2023-03-01 DIAGNOSIS — E039 Hypothyroidism, unspecified: Secondary | ICD-10-CM

## 2023-03-01 DIAGNOSIS — I10 Essential (primary) hypertension: Secondary | ICD-10-CM | POA: Diagnosis not present

## 2023-03-01 DIAGNOSIS — R1319 Other dysphagia: Secondary | ICD-10-CM | POA: Diagnosis not present

## 2023-03-01 DIAGNOSIS — M25512 Pain in left shoulder: Secondary | ICD-10-CM | POA: Diagnosis not present

## 2023-03-01 DIAGNOSIS — R1310 Dysphagia, unspecified: Secondary | ICD-10-CM | POA: Insufficient documentation

## 2023-03-01 DIAGNOSIS — J449 Chronic obstructive pulmonary disease, unspecified: Secondary | ICD-10-CM

## 2023-03-01 DIAGNOSIS — E559 Vitamin D deficiency, unspecified: Secondary | ICD-10-CM | POA: Diagnosis not present

## 2023-03-01 DIAGNOSIS — E785 Hyperlipidemia, unspecified: Secondary | ICD-10-CM

## 2023-03-01 DIAGNOSIS — R7302 Impaired glucose tolerance (oral): Secondary | ICD-10-CM | POA: Diagnosis not present

## 2023-03-01 DIAGNOSIS — J411 Mucopurulent chronic bronchitis: Secondary | ICD-10-CM

## 2023-03-01 DIAGNOSIS — R490 Dysphonia: Secondary | ICD-10-CM | POA: Diagnosis not present

## 2023-03-01 DIAGNOSIS — R059 Cough, unspecified: Secondary | ICD-10-CM | POA: Diagnosis not present

## 2023-03-01 MED ORDER — DOXYCYCLINE HYCLATE 100 MG PO TABS: 100.0000 mg | ORAL_TABLET | Freq: Two times a day (BID) | ORAL | 0 refills | Status: DC

## 2023-03-01 MED ORDER — BUDESONIDE-FORMOTEROL FUMARATE 160-4.5 MCG/ACT IN AERO: 2.0000 | INHALATION_SPRAY | Freq: Two times a day (BID) | RESPIRATORY_TRACT | 3 refills | Status: DC

## 2023-03-01 MED ORDER — BENZONATATE 200 MG PO CAPS: 200.0000 mg | ORAL_CAPSULE | Freq: Two times a day (BID) | ORAL | 0 refills | Status: DC | PRN

## 2023-03-01 MED ORDER — OMEPRAZOLE 40 MG PO CPDR: DELAYED_RELEASE_CAPSULE | ORAL | 0 refills | Status: DC

## 2023-03-01 MED ORDER — MELOXICAM 7.5 MG PO TABS: 7.5000 mg | ORAL_TABLET | Freq: Every day | ORAL | 1 refills | Status: DC

## 2023-03-01 NOTE — Assessment & Plan Note (Signed)
Change from ibuprofen to meloxicam 7.5 mg daily as needed

## 2023-03-01 NOTE — Assessment & Plan Note (Signed)
Updated lab needed at/ before next visit.  Not adequately treated and lost to follow up

## 2023-03-01 NOTE — Progress Notes (Signed)
Bianca Mcguire     MRN: [id number]      DOB: September 04, 1946   HPI Bianca Mcguire is here for follow up and re-evaluation of chronic medical conditions, medication management and review of any available recent lab and radiology data.  Preventive health is updated, specifically  Cancer screening and Immunization.   Chest congestion and cough x 6 weeks Painless hoarseness  x  6 weeks, getting worse,  no response to steroids and Z pack Solid dysphagia x 3 weeks   ROS Denies recent fever or chills. Denies sinus pressure, nasal congestion, ear pain or sore throat. . Denies chest pains, palpitations and leg swelling Denies abdominal pain, nausea, vomiting,diarrhea or constipation.   Denies dysuria, frequency, hesitancy or incontinence. Chronic  joint pain, swelling and limitation in mobility in particular left shoulder , needs daily NSAID Denies headaches, seizures, numbness, or tingling. Denies depression, anxiety or insomnia. Denies skin break down or rash.   PE  BP 128/82   Pulse 71   Ht 5\' 2"  (1.575 m)   Wt 154 lb 1.9 oz (69.9 kg)   SpO2 90%   BMI 28.19 kg/m   Patient alert and oriented and in no cardiopulmonary distress.HOARSE  HEENT: No facial asymmetry, EOMI,     Neck supple .o sinus tenderness  Chest: decreased air entry throughout esp in lung base CVS: S1, S2 no murmurs, no S3.Regular rate.  ABD: Soft non tender.   Ext: No edema  MS: decreased  ROM spine, shoulders, hips and knees.Marked deformity of fingers  Skin: Intact, no ulcerations or rash noted.  Psych: Good eye contact, normal affect. Memory intact not anxious or depressed appearing.  CNS: CN 2-12 intact, power,  normal throughout.no focal deficits noted.   Assessment & Plan  Mixed simple and mucopurulent chronic bronchitis (HCC) Persistent cough and congestion, additional antibiotic course with decongestant and CXR  Essential hypertension Controlled, no change in medication   Adult  hypothyroidism Updated lab needed at/ before next visit.  Not adequately treated and lost to follow up  IGT (impaired glucose tolerance) Patient educated about the importance of limiting  Carbohydrate intake , the need to commit to daily physical activity for a minimum of 30 minutes , and to commit weight loss. The fact that changes in all these areas will reduce or eliminate all together the development of diabetes is stressed.      Latest Ref Rng & Units 02/28/2022    8:47 AM 10/26/2021   10:47 AM 04/11/2021   11:08 AM 03/31/2021   11:53 AM 05/05/2020   11:50 AM  Diabetic Labs  Chol 100 - 199 mg/dL 199       HDL >39 mg/dL 67       Calc LDL 0 - 99 mg/dL 120       Triglycerides 0 - 149 mg/dL 64    87    Creatinine 0.57 - 1.00 mg/dL 0.71  0.62  0.95  0.92  0.60       03/01/2023   10:06 AM 03/01/2023    9:31 AM 03/01/2023    9:29 AM 01/30/2023    2:23 PM 01/30/2023    2:21 PM 05/01/2022   10:24 AM 05/01/2022    9:29 AM  BP/Weight  Systolic BP 0000000 [id number] 123456 123456 [id number]  Diastolic BP 82 78 78 82 83 60 66  Wt. (Lbs)   154.12  151.8    BMI   28.19 kg/m2  27.76 kg/m2  Latest Ref Rng & Units 11/25/2015    8:41 AM  Foot/eye exam completion dates  Eye Exam No Retinopathy No Retinopathy         This result is from an external source.    Updated lab needed at/ before next visit.   Hyperlipidemia Hyperlipidemia:Low fat diet discussed and encouraged.   Lipid Panel  Lab Results  Component Value Date   CHOL 199 02/28/2022   HDL 67 02/28/2022   LDLCALC 120 (H) 02/28/2022   TRIG 64 02/28/2022   CHOLHDL 3.0 02/28/2022     Updated lab needed at/ before next visit.   COPD (chronic obstructive pulmonary disease) (HCC) Increase dose of symbicort  SHOULDER PAIN, LEFT Change from ibuprofen to meloxicam 7.5 mg daily as needed  Dysphagia 3 week h/o solid dysphagia and uncontrolled GERD symptoms with painless hoarseness, double dose  PPI x 1 month only and refer to GI   urgently  Hoarseness of voice Worsening x 3 weeks, at times can barely speak, needs ENT eval , likely due to uncontrolled GERD, need to r/o laryngeal lesion

## 2023-03-01 NOTE — Assessment & Plan Note (Signed)
Patient educated about the importance of limiting  Carbohydrate intake , the need to commit to daily physical activity for a minimum of 30 minutes , and to commit weight loss. The fact that changes in all these areas will reduce or eliminate all together the development of diabetes is stressed.      Latest Ref Rng & Units 02/28/2022    8:47 AM 10/26/2021   10:47 AM 04/11/2021   11:08 AM 03/31/2021   11:53 AM 05/05/2020   11:50 AM  Diabetic Labs  Chol 100 - 199 mg/dL 199       HDL >39 mg/dL 67       Calc LDL 0 - 99 mg/dL 120       Triglycerides 0 - 149 mg/dL 64    87    Creatinine 0.57 - 1.00 mg/dL 0.71  0.62  0.95  0.92  0.60       03/01/2023   10:06 AM 03/01/2023    9:31 AM 03/01/2023    9:29 AM 01/30/2023    2:23 PM 01/30/2023    2:21 PM 05/01/2022   10:24 AM 05/01/2022    9:29 AM  BP/Weight  Systolic BP 0000000 [id number] 123456 123456 [id number]  Diastolic BP 82 78 78 82 83 60 66  Wt. (Lbs)   154.12  151.8    BMI   28.19 kg/m2  27.76 kg/m2        Latest Ref Rng & Units 11/25/2015    8:41 AM  Foot/eye exam completion dates  Eye Exam No Retinopathy No Retinopathy         This result is from an external source.    Updated lab needed at/ before next visit.

## 2023-03-01 NOTE — Assessment & Plan Note (Signed)
Persistent cough and congestion, additional antibiotic course with decongestant and CXR

## 2023-03-01 NOTE — Assessment & Plan Note (Signed)
Worsening x 3 weeks, at times can barely speak, needs ENT eval , likely due to uncontrolled GERD, need to r/o laryngeal lesion

## 2023-03-01 NOTE — Assessment & Plan Note (Signed)
Hyperlipidemia:Low fat diet discussed and encouraged.   Lipid Panel  Lab Results  Component Value Date   CHOL 199 02/28/2022   HDL 67 02/28/2022   LDLCALC 120 (H) 02/28/2022   TRIG 64 02/28/2022   CHOLHDL 3.0 02/28/2022     Updated lab needed at/ before next visit.

## 2023-03-01 NOTE — Assessment & Plan Note (Signed)
Increase dose of symbicort

## 2023-03-01 NOTE — Assessment & Plan Note (Signed)
Controlled, no change in medication  

## 2023-03-01 NOTE — Patient Instructions (Addendum)
F/U in 6 weekks, call if you need me sooner  You are  referred urgently to GI and ENT  cXR today  Antibiotic and decongestant are at local pharmacy  Increase omeprazole to 40 mg twice daily for 1 month only, take two 20 mg capsules till higher dose comes from mail order  Nurse pls order / arrange for CT chest for lung cancer screening  Labs today CBC and diff, cmp an dEGFr, lipid, HBA1C, TSH and vit D  Please schedule mammogram at checkout  Thanks for choosing [facility], we consider it a privelige to serve you.

## 2023-03-01 NOTE — Assessment & Plan Note (Signed)
3 week h/o solid dysphagia and uncontrolled GERD symptoms with painless hoarseness, double dose  PPI x 1 month only and refer to GI  urgently

## 2023-03-03 LAB — CMP14+EGFR
ALT: 10 IU/L (ref 0–32)
AST: 18 IU/L (ref 0–40)
Albumin/Globulin Ratio: 1.5 (ref 1.2–2.2)
Albumin: 4 g/dL (ref 3.8–4.8)
Alkaline Phosphatase: 104 IU/L (ref 44–121)
BUN/Creatinine Ratio: 29 — ABNORMAL HIGH (ref 12–28)
BUN: 18 mg/dL (ref 8–27)
Bilirubin Total: 0.3 mg/dL (ref 0.0–1.2)
CO2: 21 mmol/L (ref 20–29)
Calcium: 9.2 mg/dL (ref 8.7–10.3)
Chloride: 103 mmol/L (ref 96–106)
Creatinine, Ser: 0.63 mg/dL (ref 0.57–1.00)
Globulin, Total: 2.7 g/dL (ref 1.5–4.5)
Glucose: 87 mg/dL (ref 70–99)
Potassium: 4.2 mmol/L (ref 3.5–5.2)
Sodium: 141 mmol/L (ref 134–144)
Total Protein: 6.7 g/dL (ref 6.0–8.5)
eGFR: 92 mL/min/{1.73_m2} (ref >59)

## 2023-03-03 LAB — LIPID PANEL
Chol/HDL Ratio: 2.7 ratio (ref 0.0–4.4)
Cholesterol, Total: 194 mg/dL (ref 100–199)
HDL: 72 mg/dL (ref >39)
LDL Chol Calc (NIH): 109 mg/dL — ABNORMAL HIGH (ref 0–99)
Triglycerides: 73 mg/dL (ref 0–149)
VLDL Cholesterol Cal: 13 mg/dL (ref 5–40)

## 2023-03-03 LAB — CBC WITH DIFFERENTIAL/PLATELET
Basophils Absolute: 0.1 10*3/uL (ref 0.0–0.2)
Basos: 1 %
EOS (ABSOLUTE): 0.2 10*3/uL (ref 0.0–0.4)
Eos: 2 %
Hematocrit: 35.9 % (ref 34.0–46.6)
Hemoglobin: 11.8 g/dL (ref 11.1–15.9)
Immature Grans (Abs): 0.2 10*3/uL — ABNORMAL HIGH (ref 0.0–0.1)
Immature Granulocytes: 3 %
Lymphocytes Absolute: 1.6 10*3/uL (ref 0.7–3.1)
Lymphs: 19 %
MCH: 26.6 pg (ref 26.6–33.0)
MCHC: 32.9 g/dL (ref 31.5–35.7)
MCV: 81 fL (ref 79–97)
Monocytes Absolute: 0.6 10*3/uL (ref 0.1–0.9)
Monocytes: 7 %
Neutrophils Absolute: 5.4 10*3/uL (ref 1.4–7.0)
Neutrophils: 68 %
Platelets: 240 10*3/uL (ref 150–450)
RBC: 4.43 x10E6/uL (ref 3.77–5.28)
RDW: 13.9 % (ref 11.7–15.4)
WBC: 8 10*3/uL (ref 3.4–10.8)

## 2023-03-03 LAB — HEMOGLOBIN A1C
Est. average glucose Bld gHb Est-mCnc: 117 mg/dL
Hgb A1c MFr Bld: 5.7 % — ABNORMAL HIGH (ref 4.8–5.6)

## 2023-03-03 LAB — VITAMIN D 25 HYDROXY (VIT D DEFICIENCY, FRACTURES): Vit D, 25-Hydroxy: 54.8 ng/mL (ref 30.0–100.0)

## 2023-03-03 LAB — TSH: TSH: 1.85 u[IU]/mL (ref 0.450–4.500)

## 2023-03-12 ENCOUNTER — Other Ambulatory Visit: Payer: Self-pay | Admitting: Family Medicine

## 2023-03-15 ENCOUNTER — Other Ambulatory Visit: Payer: Self-pay | Admitting: Family Medicine

## 2023-03-20 ENCOUNTER — Encounter: Payer: Self-pay | Admitting: Internal Medicine

## 2023-03-20 ENCOUNTER — Ambulatory Visit (INDEPENDENT_AMBULATORY_CARE_PROVIDER_SITE_OTHER): Payer: 59 | Admitting: Internal Medicine

## 2023-03-20 ENCOUNTER — Other Ambulatory Visit: Payer: Self-pay | Admitting: Family Medicine

## 2023-03-20 VITALS — BP 138/82 | HR 80 | Temp 97.9°F | Ht 62.0 in | Wt 154.3 lb

## 2023-03-20 DIAGNOSIS — K219 Gastro-esophageal reflux disease without esophagitis: Secondary | ICD-10-CM

## 2023-03-20 DIAGNOSIS — Z791 Long term (current) use of non-steroidal anti-inflammatories (NSAID): Secondary | ICD-10-CM | POA: Diagnosis not present

## 2023-03-20 DIAGNOSIS — R1319 Other dysphagia: Secondary | ICD-10-CM

## 2023-03-20 NOTE — Progress Notes (Signed)
Referring Provider: Kerri Perches, MD Primary Care Physician:  Kerri Perches, MD Primary GI:  Dr. Marletta Lor  Chief Complaint  Patient presents with   Dysphagia    Having trouble swallowing. Has been getting choked 2 -3 times a week.     HPI:   Bianca Mcguire is a 77 y.o. female who presents to clinic today for follow-up visit.  Previously seen for positive Cologuard.  Underwent colonoscopy 05/01/2022 which was unremarkable besides diverticulosis and internal hemorrhoids.  Presenting today with dysphagia.  Symptoms have been going on for 2 months. Notes getting "choked" 2-3 times a week.  Symptoms mild to moderate.  Primarily with solids, occasionally liquids, no issues with pills.  Occasional regurgitation.  Started on omeprazole 40 mg twice daily 03/01/23.  She states her symptoms are steadily improving  Uses Symbicort for COPD. Has not been washing her mouth out after use.   Takes Meloxicam daily for arthritis.   Past Medical History:  Diagnosis Date   Acute pain of right knee 01/30/2011   Qualifier: Diagnosis of  By: Lodema Hong MD, Tessie     Anemia    Arthritis    Carpal tunnel syndrome, bilateral    Diverticulosis dx 2011   GERD (gastroesophageal reflux disease)    Headache    Hypertension 06/02/2018   Low back pain    Obesity    Rheumatoid arthritis(714.0)     Past Surgical History:  Procedure Laterality Date   bilateral foot surgery     COLONOSCOPY  2011   Dr. Darrick Penna: 38mm sessile polyp (tubular adenoma), pancolonic diverticulosis   COLONOSCOPY N/A 09/26/2017   diverticulosis, redundant colon   COLONOSCOPY WITH PROPOFOL N/A 05/01/2022   Procedure: COLONOSCOPY WITH PROPOFOL;  Surgeon: Lanelle Bal, DO;  Location: [facility];  Service: Endoscopy;  Laterality: N/A;  11:30am   ESOPHAGOGASTRODUODENOSCOPY N/A 09/16/2017   Dr. Darrick Penna: multiple gastric polyps, (fundic gland), mild gastritis, path with duodenal mucosa with focal villous blunting and  surface erosions, moderate chronic gastritis with intestinal metaplasia and reactive changes. no H.pylori, dysplasia, or malignancy   FLEXIBLE SIGMOIDOSCOPY N/A 09/16/2017   Procedure: FLEXIBLE SIGMOIDOSCOPY;  Surgeon: West Bali, MD;  Location: [facility];  Service: Endoscopy;  Laterality: N/A;   HEMORRHOID SURGERY  1980   KNEE ARTHROSCOPY WITH LATERAL MENISECTOMY Right 10/16/2018   Procedure: KNEE ARTHROSCOPY WITH LATERAL MENISCECTOMY;  Surgeon: Vickki Hearing, MD;  Location: [facility];  Service: Orthopedics;  Laterality: Right;   OOPHORECTOMY Right    roophorectomy for benign disease  02/2010   Dr. Emelda Fear   tonsillectomy and adenoidctomy in childhood     TUBAL LIGATION  1977    Current Outpatient Medications  Medication Sig Dispense Refill   acetaminophen (TYLENOL) 500 MG tablet Take 500-1,000 mg by mouth every 6 (six) hours as needed for headache.     alendronate (FOSAMAX) 70 MG tablet TAKE 1 TABLET BY MOUTH WEEKLY  WITH 8 OZ OF PLAIN WATER 30  MINUTES BEFORE FIRST FOOD, DRINK OR MEDS. STAY UPRIGHT FOR 30  MINS 12 tablet 3   amLODipine (NORVASC) 5 MG tablet TAKE 1 TABLET BY MOUTH DAILY 100 tablet 2   benzonatate (TESSALON) 200 MG capsule Take 1 capsule (200 mg total) by mouth 2 (two) times daily as needed for cough. 30 capsule 0   benzonatate (TESSALON) 200 MG capsule Take 1 capsule (200 mg total) by mouth 2 (two) times daily as needed for cough. 20 capsule 0   BIOTIN  PO Take 1 tablet by mouth in the morning.     budesonide-formoterol (SYMBICORT) 160-4.5 MCG/ACT inhaler Inhale 2 puffs into the lungs 2 (two) times daily. 3 each 3   Calcium Carb-Cholecalciferol (CALCIUM 600 + D PO) Take 1 tablet by mouth every evening.      Cholecalciferol (VITAMIN D3) 50 MCG (2000 UT) TABS Take 2,000 Units by mouth in the morning.     CLENPIQ 10-3.5-12 MG-GM -GM/175ML SOLN Take by mouth.     doxycycline (VIBRA-TABS) 100 MG tablet Take 1 tablet (100 mg total) by mouth 2 (two) times daily. 14  tablet 0   fluticasone (FLONASE) 50 MCG/ACT nasal spray SHAKE LIQUID AND USE 2 SPRAYS IN EACH NOSTRIL DAILY (Patient taking differently: Place 2 sprays into both nostrils daily as needed for allergies.) 16 g 6   folic acid (FOLVITE) 800 MCG tablet Take 800 mcg by mouth in the morning.     gabapentin (NEURONTIN) 100 MG capsule TAKE 1 CAPSULE BY MOUTH AT  BEDTIME 100 capsule 2   ipratropium-albuterol (DUONEB) 0.5-2.5 (3) MG/3ML SOLN Take 3 mLs by nebulization every 6 (six) hours as needed (wheezing/shortness of breath.). 360 mL 0   levothyroxine (SYNTHROID) 75 MCG tablet TAKE 1 TABLET BY MOUTH DAILY 100 tablet 2   meloxicam (MOBIC) 7.5 MG tablet Take 1 tablet (7.5 mg total) by mouth daily. 90 tablet 1   montelukast (SINGULAIR) 10 MG tablet TAKE 1 TABLET BY MOUTH AT  BEDTIME 100 tablet 2   Multiple Vitamins-Iron (MULTIVITAMINS WITH IRON) TABS tablet Take 1 tablet by mouth in the morning.     omeprazole (PRILOSEC) 40 MG capsule TAKE 1 CAPSULE BY MOUTH TWICE  DAILY FOR 1 MONTH ONLY 60 capsule 11   ondansetron (ZOFRAN) 4 MG tablet Take 1 tablet (4 mg total) by mouth every 8 (eight) hours as needed for nausea or vomiting. 20 tablet 0   UNABLE TO FIND Nebulizer machine with tubing and supplies. 1 Product 0   vitamin B-12 (CYANOCOBALAMIN) 500 MCG tablet Take 500 mcg by mouth in the morning.     No current facility-administered medications for this visit.    Allergies as of 03/20/2023   (No Known Allergies)    Family History  Problem Relation Age of Onset   Heart failure Mother    Cancer Mother 41       oral   Heart failure Father    Arthritis Sister    Heart attack Brother    Thyroid disease Daughter    Hypertension Daughter    Hypertension Daughter    Thyroid disease Daughter    Heart failure Sister    Diabetes Sister    Hypertension Sister    Leukemia Brother    Colon cancer Neg Hx    Colon polyps Neg Hx     Social History   Socioeconomic History   Marital status: Widowed     Spouse name: Not on file   Number of children: 3   Years of education: Not on file   Highest education level: Not on file  Occupational History   Occupation: retired    Comment: part time    Occupation: disabled for arthritis     Comment: since age 9    Employer: [employer]  Tobacco Use   Smoking status: Former    Packs/day: 2.00    Years: 25.00    Additional pack years: 0.00    Total pack years: 50.00    Types: Cigarettes    Quit date: 05/12/2014  Years since quitting: 8.8   Smokeless tobacco: Never  Vaping Use   Vaping Use: Never used  Substance and Sexual Activity   Alcohol use: No   Drug use: No   Sexual activity: Not Currently  Other Topics Concern   Not on file  Social History Narrative   Lives with Granddaughter since 2006   Social Determinants of Health   Financial Resource Strain: Low Risk  (09/06/2022)   Overall Financial Resource Strain (CARDIA)    Difficulty of Paying Living Expenses: Not hard at all  Food Insecurity: No Food Insecurity (09/06/2022)   Hunger Vital Sign    Worried About Running Out of Food in the Last Year: Never true    Ran Out of Food in the Last Year: Never true  Transportation Needs: No Transportation Needs (09/06/2022)   PRAPARE - Administrator, Civil ServiceTransportation    Lack of Transportation (Medical): No    Lack of Transportation (Non-Medical): No  Physical Activity: Inactive (09/06/2022)   Exercise Vital Sign    Days of Exercise per Week: 0 days    Minutes of Exercise per Session: 0 min  Stress: No Stress Concern Present (09/06/2022)   [employer] - Occupational Stress Questionnaire    Feeling of Stress : Not at all  Social Connections: Moderately Isolated (09/06/2022)   Social Connection and Isolation Panel [NHANES]    Frequency of Communication with Friends and Family: More than three times a week    Frequency of Social Gatherings with Friends and Family: More than three times a week    Attends Religious Services: More than 4  times per year    Active Member of [employer]ubs or Organizations: No    Attends BankerClub or Organization Meetings: Never    Marital Status: Widowed    Subjective: Review of Systems  Constitutional:  Negative for chills and fever.  HENT:  Negative for congestion and hearing loss.   Eyes:  Negative for blurred vision and double vision.  Respiratory:  Negative for cough and shortness of breath.   Cardiovascular:  Negative for chest pain and palpitations.  Gastrointestinal:  Negative for abdominal pain, blood in stool, constipation, diarrhea, heartburn, melena and vomiting.       Dysphagia  Genitourinary:  Negative for dysuria and urgency.  Musculoskeletal:  Negative for joint pain and myalgias.  Skin:  Negative for itching and rash.  Neurological:  Negative for dizziness and headaches.  Psychiatric/Behavioral:  Negative for depression. The patient is not nervous/anxious.      Objective: There were no vitals taken for this visit. Physical Exam Constitutional:      Appearance: Normal appearance.  HENT:     Head: Normocephalic and atraumatic.  Eyes:     Extraocular Movements: Extraocular movements intact.     Conjunctiva/sclera: Conjunctivae normal.  Cardiovascular:     Rate and Rhythm: Normal rate and regular rhythm.  Pulmonary:     Effort: Pulmonary effort is normal.     Breath sounds: Normal breath sounds.  Abdominal:     General: Bowel sounds are normal.     Palpations: Abdomen is soft.  Musculoskeletal:        General: No swelling. Normal range of motion.     Cervical back: Normal range of motion and neck supple.  Skin:    General: Skin is warm and dry.     Coloration: Skin is not jaundiced.  Neurological:     General: No focal deficit present.     Mental Status: She is  alert and oriented to person, place, and time.  Psychiatric:        Mood and Affect: Mood normal.        Behavior: Behavior normal.      Assessment: *Esophageal dysphagia *Chronic  GERD-well-controlled *Chronic NSAID use  Plan: Discussed dysphagia in depth with patient today.  History of chronic NSAID use.  Also uses Symbicort regularly which puts her at risk for candidal esophagitis.  Offered upper endoscopy with potential dilation to patient today.  Since she is improving on omeprazole she would like to continue medication for now and see how she does.  Continue omeprazole twice daily.  Follow-up in 2 months.  If not improved will schedule for upper endoscopy.  Patient is agreeable.  Patient to avoid tough textures. All meats should be chopped finely. Eat slowly, take small bites, chew thoroughly, and drink plenty of liquids throughout meals. Advised if something were to get hung in her esophagus and not come up or go down, she should proceed to the emergency room.  Patient counseled to wash her mouth out thoroughly after Symbicort use.   03/20/2023 9:23 AM   Disclaimer: This note was dictated with voice recognition software. Similar sounding words can inadvertently be transcribed and may not be corrected upon review.

## 2023-03-20 NOTE — Patient Instructions (Signed)
Continue on omeprazole 40 mg twice daily.  Be sure to wash out your mouth really well after Symbicort use.  Recommend to avoid tough textures. All meats should be chopped finely. Eat slowly, take small bites, chew thoroughly, and drink plenty of liquids throughout meals. If something were to get hung in your esophagus and not come up or go down, you should proceed to the emergency room.  Follow-up in 2 months.  If not improved, we will consider scheduling you for upper endoscopy to further evaluate.  It was very nice seeing you again today.  Dr. Marletta Lor

## 2023-04-01 NOTE — Progress Notes (Signed)
[facility] referral received. Attempted to reach patient but was unable to do so. Detailed VM left asking that the patient return my call. ? ?

## 2023-04-19 ENCOUNTER — Ambulatory Visit (INDEPENDENT_AMBULATORY_CARE_PROVIDER_SITE_OTHER): Payer: 59 | Admitting: Family Medicine

## 2023-04-19 ENCOUNTER — Encounter: Payer: Self-pay | Admitting: Family Medicine

## 2023-04-19 VITALS — BP 134/72 | HR 91 | Ht 62.0 in | Wt 152.1 lb

## 2023-04-19 DIAGNOSIS — M25561 Pain in right knee: Secondary | ICD-10-CM | POA: Diagnosis not present

## 2023-04-19 DIAGNOSIS — G8929 Other chronic pain: Secondary | ICD-10-CM

## 2023-04-19 DIAGNOSIS — R9389 Abnormal findings on diagnostic imaging of other specified body structures: Secondary | ICD-10-CM | POA: Diagnosis not present

## 2023-04-19 DIAGNOSIS — M25562 Pain in left knee: Secondary | ICD-10-CM | POA: Diagnosis not present

## 2023-04-19 DIAGNOSIS — M069 Rheumatoid arthritis, unspecified: Secondary | ICD-10-CM | POA: Diagnosis not present

## 2023-04-19 DIAGNOSIS — Z0001 Encounter for general adult medical examination with abnormal findings: Secondary | ICD-10-CM | POA: Diagnosis not present

## 2023-04-19 DIAGNOSIS — Z1231 Encounter for screening mammogram for malignant neoplasm of breast: Secondary | ICD-10-CM | POA: Diagnosis not present

## 2023-04-19 MED ORDER — BENZONATATE 200 MG PO CAPS: 200.0000 mg | ORAL_CAPSULE | Freq: Two times a day (BID) | ORAL | 0 refills | Status: DC | PRN

## 2023-04-19 NOTE — Assessment & Plan Note (Signed)

## 2023-04-19 NOTE — Progress Notes (Unsigned)
    Bianca Mcguire     MRN: [id number]      DOB: Oct 12, 1946  Chief Complaint  Patient presents with   Follow-up    Follow up check ears feels pressure, Floater in eye    HPI: Patient is in for annual physical exam. No other health concerns are expressed or addressed at the visit. Recent labs,  are reviewed. Immunization is reviewed , and  updated if needed.   PE: BP 134/72   Pulse 91   Ht 5\' 2"  (1.575 m)   Wt 152 lb 1.9 oz (69 kg)   SpO2 90%   BMI 27.82 kg/m   Pleasant  female, alert and oriented x 3, in no cardio-pulmonary distress. Afebrile. HEENT No facial trauma or asymetry. Sinuses non tender.  Extra occullar muscles intact.. External ears normal, . Neck: supple, no adenopathy,JVD or thyromegaly.No bruits.  Chest: Clear to ascultation bilaterally.No crackles or wheezes. Non tender to palpation  Breast: No asymetry,no masses or lumps. No tenderness. No nipple discharge or inversion. No axillary or supraclavicular adenopathy  Cardiovascular system; Heart sounds normal,  S1 and  S2 ,no S3.  No murmur, or thrill. Apical beat not displaced Peripheral pulses normal.  Abdomen: Soft, non tender, no organomegaly or masses. No bruits. Bowel sounds normal. No guarding, tenderness or rebound.   GU: External genitalia normal female genitalia , normal female distribution of hair. No lesions. Urethral meatus normal in size, no  Prolapse, no lesions visibly  Present. Bladder non tender. Vagina pink and moist , with no visible lesions , discharge present . Adequate pelvic support no  cystocele or rectocele noted Cervix pink and appears healthy, no lesions or ulcerations noted, no discharge noted from os Uterus normal size, no adnexal masses, no cervical motion or adnexal tenderness.   Musculoskeletal exam: Full ROM of spine, hips , shoulders and knees. No deformity ,swelling or crepitus noted. No muscle wasting or atrophy.   Neurologic: Cranial nerves 2 to 12  intact. Power, tone ,sensation and reflexes normal throughout. No disturbance in gait. No tremor.  Skin: Intact, no ulceration, erythema , scaling or rash noted. Pigmentation normal throughout  Psych; Normal mood and affect. Judgement and concentration normal   Assessment & Plan:  No problem-specific Assessment & Plan notes found for this encounter.

## 2023-04-19 NOTE — Patient Instructions (Addendum)
F/U in mid September, call if you need me sooner  Please schedule mammogram at checkout  You are referred to Dr Romeo Apple and to Dr Meyer Russel   I am going to request a chest scan, we will call next week about this,has aged out of lung cancer screening , no scan ordered  Start daily flonase to help with left ear pressure   Blood pressure is good  Careful not to fall  Thanks for choosing [facility], we consider it a privelige to serve you.

## 2023-04-19 NOTE — Assessment & Plan Note (Signed)
Increased pain and stiffness requests ortho eval

## 2023-04-21 DIAGNOSIS — R9389 Abnormal findings on diagnostic imaging of other specified body structures: Secondary | ICD-10-CM | POA: Insufficient documentation

## 2023-04-21 NOTE — Assessment & Plan Note (Signed)
Needs follow up appt to resume treatment

## 2023-04-21 NOTE — Assessment & Plan Note (Addendum)
Chronic cough with abnormal chest scan, needs f/u

## 2023-04-26 ENCOUNTER — Other Ambulatory Visit (INDEPENDENT_AMBULATORY_CARE_PROVIDER_SITE_OTHER): Payer: 59

## 2023-04-26 ENCOUNTER — Ambulatory Visit (INDEPENDENT_AMBULATORY_CARE_PROVIDER_SITE_OTHER): Payer: 59 | Admitting: Orthopedic Surgery

## 2023-04-26 ENCOUNTER — Encounter: Payer: Self-pay | Admitting: Orthopedic Surgery

## 2023-04-26 DIAGNOSIS — Z01818 Encounter for other preprocedural examination: Secondary | ICD-10-CM

## 2023-04-26 DIAGNOSIS — M25562 Pain in left knee: Secondary | ICD-10-CM | POA: Diagnosis not present

## 2023-04-26 DIAGNOSIS — M25561 Pain in right knee: Secondary | ICD-10-CM | POA: Diagnosis not present

## 2023-04-26 DIAGNOSIS — M545 Low back pain, unspecified: Secondary | ICD-10-CM | POA: Diagnosis not present

## 2023-04-26 DIAGNOSIS — G8929 Other chronic pain: Secondary | ICD-10-CM | POA: Diagnosis not present

## 2023-04-26 DIAGNOSIS — M1711 Unilateral primary osteoarthritis, right knee: Secondary | ICD-10-CM

## 2023-04-26 DIAGNOSIS — R29898 Other symptoms and signs involving the musculoskeletal system: Secondary | ICD-10-CM | POA: Diagnosis not present

## 2023-04-26 MED ORDER — BUPIVACAINE-MELOXICAM ER 400-12 MG/14ML IJ SOLN: 400.0000 mg | Freq: Once | INTRAMUSCULAR | Status: AC

## 2023-04-26 NOTE — Patient Instructions (Addendum)
While we are working on your approval for MRI please go ahead and call to schedule your appointment with [facility] within at least one (1) week.   Central Scheduling [phone]    Your surgery will be at [facility] by Dr Romeo Apple  plan to be in hospital overnight. The hospital will contact you with a preoperative appointment to discuss Anesthesia.  Please arrive on time or 15 minutes early for the preoperative appointment, they have a very tight schedule if you are late or do not come in your surgery will be cancelled.  The phone number for the preop area is [phone]. Please bring your medications with you for the appointment. They will tell you the arrival time for surgery and medication instructions when you have your preoperative evaluation. Do not wear nail polish the day of your surgery and if you take Phentermine you need to stop this medication ONE WEEK prior to your surgery. f you take Docia Barrier, Jardiance, or Steglatro) - Hold 72 hours before the procedure.  If you take Ozempic,  Bydureon or Trulicity do not take for 8 days before your surgery. If you take Victoza, Rybelsis, Saxenda or Adlyxi stop 24 hours before the procedure.  Please arrive at the hospital 2 hours before procedure if scheduled at 9:30 or later in the day or at the time the nurse tells you at your preoperative visit.   If you have my chart do not use the time given in my chart use the time given to you by the nurse during your preoperative visit.   Your surgery  time may change. Please be available for phone calls the day of your surgery and the day before. The Short Stay department may need to discuss changes about your surgery time. Not reaching the you could lead to procedure delays and possible cancellation.  You must have a ride home and someone to stay with you for 24 to 48 hours. The person taking you home will receive and sign for the your discharge instructions.  Please be prepared to  give your support person's name and telephone number to Central Registration. Dr Romeo Apple will need that name and phone number post procedure.   You will also get a call from a representative of [employer], they have a machine that you will use in the first few weeks after surgery. It is called a CPM.   You will have home physical therapy for 2 weeks after surgery, the home health agency will call you before or just following the surgery to set up visits. [facility] is the agency we normally use, unless you request another agency.   You will get a call also from outpatient therapy for therapy starting when the home therapy is done.  If you have questions or need to Reschedule the surgery, call the office ask for Elfriede Bonini.    You have decided to proceed with knee replacement surgery. You have decided not to continue with nonoperative measures such as but not limited to oral medication, weight loss, activity modification, physical therapy, bracing, or injection.  We will perform the procedure commonly known as total knee replacement. Some of the risks associated with knee replacement surgery include but are not limited to Bleeding Infection Swelling Stiffness Blood clot Pulmonary embolism  Loosening of the implant Pain that persists even after surgery  Infection is especially devastating complication of knee surgery although rare. If infection does occur your implant will usually have to be removed and  several surgeries and antibiotics will be needed to eradicate the infection prior to performing a repeat replacement.   In some cases amputation is required to eradicate the infection. In other rare cases a knee fusion is needed   In compliance with recent [place] law in federal regulation regarding opioid use and abuse and addiction, we will taper (stop) opioid medication after 2 weeks.  If you're not comfortable with these risks and would like to continue with nonoperative treatment please  let Dr. Romeo Apple know prior to your surgery.

## 2023-04-26 NOTE — Progress Notes (Signed)
Chief Complaint  Patient presents with   Knee Pain    Bilateral wants to discuss surgery    77 year old female who I followed for bilateral knee pain.  She has rheumatoid arthritis.  She has a history of COPD but is not on oxygen and does not use a CPAP presents now with worsening pain requesting surgery on her right knee  She has a myriad of symptoms which include night pain, trouble getting up and down from a seated position, back pain when standing, requiring a shopping cart to do her shopping and legs giving way  So she has some signs of spinal stenosis  Review of systems cough occasional wheezing No chest pain No abdominal pain but to see GI prior to surgery Rheumatology appointment scheduled for next week  Past Medical History:  Diagnosis Date   Acute pain of right knee 01/30/2011   Qualifier: Diagnosis of  By: Lodema Hong MD, Cindel     Anemia    Arthritis    Carpal tunnel syndrome, bilateral    Diverticulosis dx 2011   GERD (gastroesophageal reflux disease)    Headache    Hypertension 06/02/2018   Low back pain    Obesity    Rheumatoid arthritis(714.0)    Past Surgical History:  Procedure Laterality Date   bilateral foot surgery     COLONOSCOPY  2011   Dr. Darrick Penna: 3mm sessile polyp (tubular adenoma), pancolonic diverticulosis   COLONOSCOPY N/A 09/26/2017   diverticulosis, redundant colon   COLONOSCOPY WITH PROPOFOL N/A 05/01/2022   Procedure: COLONOSCOPY WITH PROPOFOL;  Surgeon: Lanelle Bal, DO;  Location: [facility];  Service: Endoscopy;  Laterality: N/A;  11:30am   ESOPHAGOGASTRODUODENOSCOPY N/A 09/16/2017   Dr. Darrick Penna: multiple gastric polyps, (fundic gland), mild gastritis, path with duodenal mucosa with focal villous blunting and surface erosions, moderate chronic gastritis with intestinal metaplasia and reactive changes. no H.pylori, dysplasia, or malignancy   FLEXIBLE SIGMOIDOSCOPY N/A 09/16/2017   Procedure: FLEXIBLE SIGMOIDOSCOPY;  Surgeon: West Bali, MD;  Location: [facility];  Service: Endoscopy;  Laterality: N/A;   HEMORRHOID SURGERY  1980   KNEE ARTHROSCOPY WITH LATERAL MENISECTOMY Right 10/16/2018   Procedure: KNEE ARTHROSCOPY WITH LATERAL MENISCECTOMY;  Surgeon: Vickki Hearing, MD;  Location: [facility];  Service: Orthopedics;  Laterality: Right;   OOPHORECTOMY Right    roophorectomy for benign disease  02/2010   Dr. Emelda Fear   tonsillectomy and adenoidctomy in childhood     TUBAL LIGATION  1977   Breast Cancer-relatedfamily history is not on file.,  Family History  Problem Relation Age of Onset   Heart failure Mother    Cancer Mother 33       oral   Heart failure Father    Arthritis Sister    Heart attack Brother    Thyroid disease Daughter    Hypertension Daughter    Hypertension Daughter    Thyroid disease Daughter    Heart failure Sister    Diabetes Sister    Hypertension Sister    Leukemia Brother    Colon cancer Neg Hx    Colon polyps Neg Hx    Social History   Tobacco Use   Smoking status: Former    Packs/day: 2.00    Years: 25.00    Additional pack years: 0.00    Total pack years: 50.00    Types: Cigarettes    Quit date: 05/12/2014    Years since quitting: 8.9   Smokeless tobacco: Never  Vaping Use  Vaping Use: Never used  Substance Use Topics   Alcohol use: No   Drug use: No   There were no vitals taken for this visit.  Physical Exam Vitals and nursing note reviewed.  Constitutional:      Appearance: Normal appearance.  HENT:     Head: Normocephalic and atraumatic.  Eyes:     General: No scleral icterus.       Right eye: No discharge.        Left eye: No discharge.     Extraocular Movements: Extraocular movements intact.     Conjunctiva/sclera: Conjunctivae normal.     Pupils: Pupils are equal, round, and reactive to light.  Cardiovascular:     Rate and Rhythm: Normal rate.     Pulses: Normal pulses.  Skin:    General: Skin is warm and dry.     Capillary Refill:  Capillary refill takes less than 2 seconds.  Neurological:     General: No focal deficit present.     Mental Status: She is alert and oriented to person, place, and time.  Psychiatric:        Mood and Affect: Mood normal.        Behavior: Behavior normal.        Thought Content: Thought content normal.        Judgment: Judgment normal.    Right knee is in valgus alignment there is tenderness along the lateral joint line there appears to be an effusion she does not have any laxity her range of motion passive I can stretch her out to full extension active she is short of extension by 5 degrees  Flexion is 125 degrees.  X-rays show valgus arthritis 15 degrees sclerosis subchondral bone laterally and medially squaring of the medial femoral condyle patellofemoral osteophyte and joint space narrowing without subluxation  Osteopenia  Consistent findings with rheumatoid arthritis  Recommend MRI of the spine to evaluate the spinal stenosis prior to surgery  Plan for right total knee arthroplasty  The procedure has been fully reviewed with the patient; The risks and benefits of surgery have been discussed and explained and understood. Alternative treatment has also been reviewed, questions were encouraged and answered. The postoperative plan is also been reviewed.

## 2023-04-30 DIAGNOSIS — M79642 Pain in left hand: Secondary | ICD-10-CM | POA: Diagnosis not present

## 2023-04-30 DIAGNOSIS — M79641 Pain in right hand: Secondary | ICD-10-CM | POA: Diagnosis not present

## 2023-04-30 DIAGNOSIS — M0579 Rheumatoid arthritis with rheumatoid factor of multiple sites without organ or systems involvement: Secondary | ICD-10-CM | POA: Diagnosis not present

## 2023-04-30 DIAGNOSIS — Z79899 Other long term (current) drug therapy: Secondary | ICD-10-CM | POA: Diagnosis not present

## 2023-04-30 DIAGNOSIS — M1991 Primary osteoarthritis, unspecified site: Secondary | ICD-10-CM | POA: Diagnosis not present

## 2023-05-01 LAB — LAB REPORT - SCANNED: EGFR: 94

## 2023-05-08 ENCOUNTER — Ambulatory Visit (HOSPITAL_COMMUNITY)
Admission: RE | Admit: 2023-05-08 | Discharge: 2023-05-08 | Disposition: A | Discharge status: Home / Self Care | Payer: 59 | Source: Ambulatory Visit | Attending: Family Medicine | Admitting: Family Medicine

## 2023-05-08 ENCOUNTER — Encounter (HOSPITAL_COMMUNITY): Payer: Self-pay

## 2023-05-08 ENCOUNTER — Ambulatory Visit (HOSPITAL_COMMUNITY)
Admission: RE | Admit: 2023-05-08 | Discharge: 2023-05-08 | Disposition: A | Discharge status: Home / Self Care | Payer: 59 | Source: Ambulatory Visit | Attending: Orthopedic Surgery | Admitting: Orthopedic Surgery

## 2023-05-08 DIAGNOSIS — I429 Cardiomyopathy, unspecified: Secondary | ICD-10-CM | POA: Diagnosis not present

## 2023-05-08 DIAGNOSIS — Z1231 Encounter for screening mammogram for malignant neoplasm of breast: Secondary | ICD-10-CM | POA: Insufficient documentation

## 2023-05-08 DIAGNOSIS — Z87442 Personal history of urinary calculi: Secondary | ICD-10-CM | POA: Diagnosis not present

## 2023-05-08 DIAGNOSIS — G8929 Other chronic pain: Secondary | ICD-10-CM | POA: Diagnosis not present

## 2023-05-08 DIAGNOSIS — G4730 Sleep apnea, unspecified: Secondary | ICD-10-CM | POA: Diagnosis not present

## 2023-05-08 DIAGNOSIS — J449 Chronic obstructive pulmonary disease, unspecified: Secondary | ICD-10-CM | POA: Diagnosis not present

## 2023-05-08 DIAGNOSIS — M549 Dorsalgia, unspecified: Secondary | ICD-10-CM | POA: Diagnosis not present

## 2023-05-08 DIAGNOSIS — N183 Chronic kidney disease, stage 3 unspecified: Secondary | ICD-10-CM | POA: Diagnosis not present

## 2023-05-08 DIAGNOSIS — M545 Low back pain, unspecified: Secondary | ICD-10-CM | POA: Diagnosis not present

## 2023-05-08 DIAGNOSIS — R29898 Other symptoms and signs involving the musculoskeletal system: Secondary | ICD-10-CM

## 2023-05-08 DIAGNOSIS — M25561 Pain in right knee: Secondary | ICD-10-CM | POA: Diagnosis not present

## 2023-05-08 DIAGNOSIS — I130 Hypertensive heart and chronic kidney disease with heart failure and stage 1 through stage 4 chronic kidney disease, or unspecified chronic kidney disease: Secondary | ICD-10-CM | POA: Diagnosis not present

## 2023-05-08 DIAGNOSIS — Z95810 Presence of automatic (implantable) cardiac defibrillator: Secondary | ICD-10-CM | POA: Diagnosis not present

## 2023-05-08 DIAGNOSIS — Z8249 Family history of ischemic heart disease and other diseases of the circulatory system: Secondary | ICD-10-CM | POA: Diagnosis not present

## 2023-05-08 DIAGNOSIS — Z87891 Personal history of nicotine dependence: Secondary | ICD-10-CM | POA: Diagnosis not present

## 2023-05-08 DIAGNOSIS — M5416 Radiculopathy, lumbar region: Secondary | ICD-10-CM | POA: Diagnosis not present

## 2023-05-08 DIAGNOSIS — Z8674 Personal history of sudden cardiac arrest: Secondary | ICD-10-CM | POA: Diagnosis not present

## 2023-05-09 ENCOUNTER — Other Ambulatory Visit: Payer: Self-pay | Admitting: Family Medicine

## 2023-05-14 DIAGNOSIS — H43391 Other vitreous opacities, right eye: Secondary | ICD-10-CM | POA: Diagnosis not present

## 2023-05-20 ENCOUNTER — Other Ambulatory Visit: Payer: Self-pay | Admitting: Orthopedic Surgery

## 2023-05-20 DIAGNOSIS — M1711 Unilateral primary osteoarthritis, right knee: Secondary | ICD-10-CM

## 2023-05-20 NOTE — Progress Notes (Unsigned)
GI Office Note    Referring Provider: Kerri Perches, MD Primary Care Physician:  Kerri Perches, MD Primary Gastroenterologist: Hennie Duos. Marletta Lor, DO  Date:  05/21/2023  ID:  Bianca Mcguire, DOB 1946/05/27, MRN [id number]   Chief Complaint   Chief Complaint  Patient presents with   Follow-up    Patient here today to follow up on her Esophageal dysphagia. Patient says she is doing much better and not having that many issues with it. She is taking omeprazole 40 mg one bid and this seems to help with her symptoms.    History of Present Illness  Bianca Mcguire is a 77 y.o. female with a history of arthritis, anemia, GERD, HTN, and carpal tunnel  presenting today for follow up on dysphagia.   + Cologuard December 2022.  Colonoscopy 05/01/22: -non bleeding internal hemorrhoids.  -left sided diverticulosis -no repeat due to age.   Last office visit 03/20/23. Seen for 2 month hx of dysphagia. Getting choked twice per week primarily with solids. Occasional regurg. Meloxicam daily for arthritis. Uses symbicort for COPD and not rinsing mouth. Advised to avoid rough textures. Continue PPI BID and follow up in 2 months - if not improved then proceed with EGD. Advised to washout mouth post symbicort. Candidal esophagitis within differential.   Today:  GERD - No N/V. Has good appetite. Taking omeprazole once daily previously and now tacking twice a day. No heartburn symptoms at all. Has been rinsing after her symbicort and doing well. No odynophagia. No white coating noted to tongue or oral cavity.   Dysphagia - Improved. No longer having issues. Able to eat anything she wants now.   Has constipation every now and then and does not usually need to take anything for it. No melena or BRBPR.   No chest pain or shortness or breath. No peripheral edema.   Is about to have knee replacement  to her right knee.    Current Outpatient Medications  Medication Sig Dispense Refill    acetaminophen (TYLENOL) 500 MG tablet Take 500-1,000 mg by mouth every 6 (six) hours as needed for headache.     alendronate (FOSAMAX) 70 MG tablet TAKE 1 TABLET BY MOUTH WEEKLY  WITH 8 OZ OF PLAIN WATER 30  MINUTES BEFORE FIRST FOOD, DRINK OR MEDS. STAY UPRIGHT FOR 30  MINS 12 tablet 3   amLODipine (NORVASC) 5 MG tablet TAKE 1 TABLET BY MOUTH DAILY 100 tablet 2   benzonatate (TESSALON) 200 MG capsule Take 1 capsule (200 mg total) by mouth 2 (two) times daily as needed for cough. 20 capsule 0   BIOTIN PO Take 1 tablet by mouth in the morning.     budesonide-formoterol (SYMBICORT) 160-4.5 MCG/ACT inhaler Inhale 2 puffs into the lungs 2 (two) times daily. 3 each 3   Calcium Carb-Cholecalciferol (CALCIUM 600 + D PO) Take 1 tablet by mouth every evening.      Cholecalciferol (VITAMIN D3) 50 MCG (2000 UT) TABS Take 2,000 Units by mouth in the morning.     fluticasone (FLONASE) 50 MCG/ACT nasal spray SHAKE LIQUID AND USE 2 SPRAYS IN EACH NOSTRIL DAILY (Patient taking differently: Place 2 sprays into both nostrils daily as needed for allergies.) 16 g 6   folic acid (FOLVITE) 800 MCG tablet Take 800 mcg by mouth in the morning.     gabapentin (NEURONTIN) 100 MG capsule TAKE 1 CAPSULE BY MOUTH AT  BEDTIME 100 capsule 2   ipratropium-albuterol (DUONEB) 0.5-2.5 (3) MG/3ML SOLN  Take 3 mLs by nebulization every 6 (six) hours as needed (wheezing/shortness of breath.). 360 mL 0   levothyroxine (SYNTHROID) 75 MCG tablet TAKE 1 TABLET BY MOUTH DAILY 100 tablet 2   meloxicam (MOBIC) 7.5 MG tablet TAKE 1 TABLET BY MOUTH DAILY 100 tablet 2   montelukast (SINGULAIR) 10 MG tablet TAKE 1 TABLET BY MOUTH AT  BEDTIME 100 tablet 2   Multiple Vitamins-Iron (MULTIVITAMINS WITH IRON) TABS tablet Take 1 tablet by mouth in the morning.     omeprazole (PRILOSEC) 40 MG capsule TAKE 1 CAPSULE BY MOUTH TWICE  DAILY FOR 1 MONTH ONLY 60 capsule 11   UNABLE TO FIND Nebulizer machine with tubing and supplies. 1 Product 0   vitamin B-12  (CYANOCOBALAMIN) 500 MCG tablet Take 500 mcg by mouth in the morning.     Current Facility-Administered Medications  Medication Dose Route Frequency Provider Last Rate Last Admin   bupivacaine-meloxicam ER (ZYNRELEF) injection 400 mg  400 mg Infiltration Once Vickki Hearing, MD        Past Medical History:  Diagnosis Date   Acute pain of right knee 01/30/2011   Qualifier: Diagnosis of  By: Lodema Hong MD, Jenai     Anemia    Arthritis    Carpal tunnel syndrome, bilateral    Diverticulosis dx 2011   GERD (gastroesophageal reflux disease)    Headache    Hypertension 06/02/2018   Low back pain    Obesity    Rheumatoid arthritis(714.0)     Past Surgical History:  Procedure Laterality Date   bilateral foot surgery     COLONOSCOPY  2011   Dr. Darrick Penna: 3mm sessile polyp (tubular adenoma), pancolonic diverticulosis   COLONOSCOPY N/A 09/26/2017   diverticulosis, redundant colon   COLONOSCOPY WITH PROPOFOL N/A 05/01/2022   Procedure: COLONOSCOPY WITH PROPOFOL;  Surgeon: Lanelle Bal, DO;  [facility]: [facility];  Service: Endoscopy;  Laterality: N/A;  11:30am   ESOPHAGOGASTRODUODENOSCOPY N/A 09/16/2017   Dr. Darrick Penna: multiple gastric polyps, (fundic gland), mild gastritis, path with duodenal mucosa with focal villous blunting and surface erosions, moderate chronic gastritis with intestinal metaplasia and reactive changes. no H.pylori, dysplasia, or malignancy   FLEXIBLE SIGMOIDOSCOPY N/A 09/16/2017   Procedure: FLEXIBLE SIGMOIDOSCOPY;  Surgeon: West Bali, MD;  [facility]: [facility];  Service: Endoscopy;  Laterality: N/A;   HEMORRHOID SURGERY  1980   KNEE ARTHROSCOPY WITH LATERAL MENISECTOMY Right 10/16/2018   Procedure: KNEE ARTHROSCOPY WITH LATERAL MENISCECTOMY;  Surgeon: Vickki Hearing, MD;  Location: [facility];  Service: Orthopedics;  Laterality: Right;   OOPHORECTOMY Right    roophorectomy for benign disease  02/2010   Dr. Emelda Fear   tonsillectomy and adenoidctomy  in childhood     TUBAL LIGATION  1977    Family History  Problem Relation Age of Onset   Heart failure Mother    Cancer Mother 20       oral   Heart failure Father    Arthritis Sister    Heart attack Brother    Thyroid disease Daughter    Hypertension Daughter    Hypertension Daughter    Thyroid disease Daughter    Heart failure Sister    Diabetes Sister    Hypertension Sister    Leukemia Brother    Colon cancer Neg Hx    Colon polyps Neg Hx     Allergies as of 05/21/2023   (No Known Allergies)    Social History   Socioeconomic History   Marital status:  Widowed    Spouse name: Not on file   Number of children: 3   Years of education: Not on file   Highest education level: Not on file  Occupational History   Occupation: retired    Comment: part time    Occupation: disabled for arthritis     Comment: since age 31    Employer: [employer]  Tobacco Use   Smoking status: Former    Packs/day: 2.00    Years: 25.00    Additional pack years: 0.00    Total pack years: 50.00    Types: Cigarettes    Quit date: 05/12/2014    Years since quitting: 9.0   Smokeless tobacco: Never  Vaping Use   Vaping Use: Never used  Substance and Sexual Activity   Alcohol use: No   Drug use: No   Sexual activity: Not Currently  Other Topics Concern   Not on file  Social History Narrative   Lives with Granddaughter since 2006   Social Determinants of Health   Financial Resource Strain: Low Risk  (09/06/2022)   Overall Financial Resource Strain (CARDIA)    Difficulty of Paying Living Expenses: Not hard at all  Food Insecurity: No Food Insecurity (09/06/2022)   Hunger Vital Sign    Worried About Running Out of Food in the Last Year: Never true    Ran Out of Food in the Last Year: Never true  Transportation Needs: No Transportation Needs (09/06/2022)   PRAPARE - Administrator, Civil Service (Medical): No    Lack of Transportation (Non-Medical): No  Physical Activity:  Inactive (09/06/2022)   Exercise Vital Sign    Days of Exercise per Week: 0 days    Minutes of Exercise per Session: 0 min  Stress: No Stress Concern Present (09/06/2022)   [employer] of Occupational Health - Occupational Stress Questionnaire    Feeling of Stress : Not at all  Social Connections: Moderately Isolated (09/06/2022)   Social Connection and Isolation Panel [NHANES]    Frequency of Communication with Friends and Family: More than three times a week    Frequency of Social Gatherings with Friends and Family: More than three times a week    Attends Religious Services: More than 4 times per year    Active Member of [employer] or Organizations: No    Attends Banker Meetings: Never    Marital Status: Widowed     Review of Systems   Gen: Denies fever, chills, anorexia. Denies fatigue, weakness, weight loss.  CV: Denies chest pain, palpitations, syncope, peripheral edema, and claudication. Resp: Denies dyspnea at rest, cough, wheezing, coughing up blood, and pleurisy. GI: See HPI Derm: Denies rash, itching, dry skin Psych: Denies depression, anxiety, memory loss, confusion. No homicidal or suicidal ideation.  Heme: Denies bruising, bleeding, and enlarged lymph nodes.   Physical Exam   BP 119/75 (BP Location: Left Arm, Patient Position: Sitting, Cuff Size: Large)   Pulse 76   Temp (!) 97.1 F (36.2 C) (Temporal)   Ht 5\' 2"  (1.575 m)   Wt 153 lb 12.8 oz (69.8 kg)   BMI 28.13 kg/m   General:   Alert and oriented. No distress noted. Pleasant and cooperative.  Head:  Normocephalic and atraumatic. Eyes:  Conjuctiva clear without scleral icterus. Mouth:  Oral mucosa pink and moist. Good dentition. No lesions. Lungs:  Clear to auscultation bilaterally. No wheezes, rales, or rhonchi. No distress.  Heart:  S1, S2 present without murmurs appreciated.  Abdomen:  +  BS, soft, non-tender and non-distended. No rebound or guarding. No HSM or masses noted. Rectal:  deferred Msk:  Symmetrical without gross deformities. Normal posture. Extremities:  Without edema. Neurologic:  Alert and  oriented x4 Psych:  Alert and cooperative. Normal mood and affect.   Assessment  Bianca Mcguire is a 77 y.o. female with a history of arthritis, anemia, GERD, HTN, and carpal tunnel  presenting today for follow up on dysphagia.   GERD, Dysphagia: Well-controlled on PPI twice daily.  No longer having any solid food dysphagia.  Denies any issues with liquids or medications.  Continues to consume NSAIDs regularly.  Denies any nausea, vomiting, abdominal pain, lack of appetite, early satiety.  Has been performing oral rinse after her Symbicort daily.  No need for EGD at this time given symptoms are improved.  Will continue PPI twice daily for another 6 months, consider going back to once daily.  Advised that if symptoms return despite PPI twice daily then we could consider EGD.  She should continue to avoid rough textures.  PLAN   Omeprazole 40 mg BID GERD diet Monitor for worsening dysphagia.  Continue to rinse mouth after Symbicort. Follow up in 6 months and consider weaning PPI.    Brooke Bonito, MSN, FNP-BC, AGACNP-BC [facility]

## 2023-05-20 NOTE — Patient Instructions (Signed)
Bianca Mcguire  05/20/2023     @PREFPERIOPPHARMACY @   Your procedure is scheduled on  05/28/2023.   Report to [facility] at  0600  A.M.   Call this number if you have problems the morning of surgery:  [phone]  If you experience any cold or flu symptoms such as cough, fever, chills, shortness of breath, etc. between now and your scheduled surgery, please notify us at the above number.   Remember:  Do not eat or drink after midnight.       Use your nebulizer and your inhaler before you come and bring your rescue inhaler with you.      Take these medicines the morning of surgery with A SIP OF WATER  amlodipine, levothyroxine, meloxicam, omeprazole.     Do not wear jewelry, make-up or nail polish, including gel polish,  artificial nails, or any other type of covering on natural nails (fingers and  toes).    Do not wear lotions, powders, or perfumes, or deodorant.   Do not shave 48 hours prior to surgery.  Men may shave face and neck.   Do not bring valuables to the hospital.  [facility] is not responsible for any belongings or valuables.  Contacts, dentures or bridgework may not be worn into surgery.  Leave your suitcase in the car.  After surgery it may be brought to your room.  For patients admitted to the hospital, discharge time will be determined by your treatment team.  Patients discharged the day of surgery will not be allowed to drive home and must have someone with them for 24 hours.    Special instructions:   DO NOT smoke tobacco or vape for 24 hours before your procedure.  Please read over the following fact sheets that you were given. Pain Booklet, Coughing and Deep Breathing, Blood Transfusion Information, Lab Information, Total Joint Packet, MRSA Information, Surgical Site Infection Prevention, Anesthesia Post-op Instructions, and Care and Recovery After Surgery        Total Knee Replacement, Care After This sheet gives you  information about how to care for yourself after your procedure. Your health care provider may also give you more specific instructions. If you have problems or questions, contact your health care provider. What can I expect after the procedure? After the procedure, it is common to have: Redness, pain, and swelling at the incision area. Stiffness. Discomfort. A small amount of blood or clear fluid coming from your incision. Follow these instructions at home: Medicines Take over-the-counter and prescription medicines only as told by your health care provider. If you were prescribed a blood thinner (anticoagulant), take it as told by your health care provider. Ask your health care provider if the medicine prescribed to you: Requires you to avoid driving or using machinery. Can cause constipation. You may need to take these actions to prevent or treat constipation: Drink enough fluid to keep your urine pale yellow. Take over-the-counter or prescription medicines. Eat foods that are high in fiber, such as beans, whole grains, and fresh fruits and vegetables. Limit foods that are high in fat and processed sugars, such as fried or sweet foods. Incision care  Follow instructions from your health care provider about how to take care of your incision. Make sure you: Wash your hands with soap and water for at least 20 seconds before and after you change your bandage (dressing). If soap and water are not available, use hand sanitizer. Change your  dressing as told by your health care provider. Leave stitches (sutures), staples, skin glue, or adhesive strips in place. These skin closures may need to stay in place for 2 weeks or longer. If adhesive strip edges start to loosen and curl up, you may trim the loose edges. Do not remove adhesive strips completely unless your health care provider tells you to do that. Do not take baths, swim, or use a hot tub until your health care provider approves. Check your  incision area every day for signs of infection. Check for: More redness, swelling, or pain. More fluid or blood. Warmth. Pus or a bad smell. Activity Rest as told by your health care provider. Avoid sitting for a long time without moving. Get up to take short walks every 1-2 hours. This is important to improve blood flow and breathing. Ask for help if you feel weak or unsteady. Follow instructions from your health care provider about using a walker, crutches, or a cane. You may use your legs to support (bear) your body weight as told by your health care provider. Follow instructions about how much weight you may safely support on your affected leg (weight-bearing restrictions). A physical therapist may show you how to get out of a bed and chair and how to go up and down stairs. You will first do this with a walker, crutches, or a cane and then without any of these devices. Once you are able to walk without a limp, you may stop using a walker, crutches, or a cane. Do exercises as told by your health care provider or physical therapist. Avoid high-impact activities, including running, jumping rope, and doing jumping jacks. Do not play contact sports until your health care provider approves. Return to your normal activities as told by your health care provider. Ask your health care provider what activities are safe for you. Managing pain, stiffness, and swelling  If directed, put ice on your knee. To do this: Put ice in a plastic bag or use the icing device (cold flow pad) that you were given. Follow instructions from your health care provider about how to use the icing device. Place a towel between your skin and the bag or between your skin and the icing device. Leave the ice on for 20 minutes, 2-3 times a day. Remove the ice if your skin turns bright red. This is very important. If you cannot feel pain, heat, or cold, you have a greater risk of damage to the area. Move your toes often to reduce  stiffness and swelling. Raise (elevate) your leg above the level of your heart while you are sitting or lying down. Use several pillows to keep your leg straight. Do not put a pillow just under the knee. If the knee is bent for a long time, this may lead to stiffness. Wear elastic knee support as told by your health care provider. Safety  To help prevent falls, keep floors clear of objects you may trip over. Place items that you may need within easy reach. Wear an apron or tool belt with pockets for carrying objects. This leaves your hands free to help with your balance. Ask your health care provider when it is safe to drive. General instructions Wear compression stockings as told by your health care provider. These stockings help to prevent blood clots and reduce swelling in your legs. Continue with breathing exercises. This helps prevent lung infection. Do not use any products that contain nicotine or tobacco. These products include cigarettes,  chewing tobacco, and vaping devices, such as e-cigarettes. These can delay healing after surgery. If you need help quitting, ask your health care provider. Tell your health care provider if you plan to have dental work. Also: Tell your dentist about your joint replacement. Ask your health care provider if there are any special instructions you need to follow before having dental care and routine cleanings. Keep all follow-up visits. This is important. Contact a health care provider if: You have a fever or chills. You have a cough or feel short of breath. Your medicine is not controlling your pain. You have any of these signs of infection: More redness, swelling, or pain around your incision. More fluid or blood coming from your incision. Warmth coming from your incision. Pus or a bad smell coming from your incision. You fall. Get help right away if: You have severe pain. You have trouble breathing. You have chest pain. You have redness,  swelling, pain, or warmth in your calf or leg. Your incision breaks open after sutures or staples are removed. These symptoms may represent a serious problem that is an emergency. Do not wait to see if the symptoms will go away. Get medical help right away. Call your local emergency services (911 in the [place] Do not drive yourself to the hospital. Summary After the procedure, it is common to have pain and swelling at the incision area, a small amount of blood or fluid coming from your incision, and stiffness. Follow instructions from your health care provider about how to take care of your incision. Use crutches, a walker, or a cane as told by your health care provider. This information is not intended to replace advice given to you by your health care provider. Make sure you discuss any questions you have with your health care provider. Document Revised: 05/17/2020 Document Reviewed: 05/17/2020 Elsevier Patient Education  2024 [employer] Spinal Anesthesia and Epidural Anesthesia, Care After While the medicines you were given are still having effects, it is common to: Feel itchy. Vomit or feel like you may vomit. Feel dizzy. Have a numb feeling and tingling in your legs. Have problems when you pee (urinate). Follow these instructions at home: For the time period you were told by your doctor: Rest. Do not do activities where you could fall or get hurt. Do not drive or use machines. Do not drink alcohol. Do not take sleeping pills or medicines that make you drowsy. Do not make big decisions or sign legal documents. Do not take care of children on your own. Medicines  Take over-the-counter and prescription medicines only as told by your doctor. If you were prescribed antibiotics, take them as told by your doctor. Do not stop taking them even if you start to feel better. Eating and drinking Follow instructions from your doctor about what you may eat and drink. Drink enough fluid to  keep your pee (urine) pale yellow. If you vomit, wait a short time. When you can drink without vomiting, try some water, juice, or clear soup. General instructions If you have breathing problems while you sleep (sleep apnea), you may be more likely to have breathing problems after surgery and when you take certain medicines. Ask your doctor when you should wear your breathing device. Wear compression stockings as told by your doctor. Return to your normal activities when your doctor says that it is safe. Your doctor may give you more instructions. Make sure you know what you can and cannot do. Contact a doctor  if: You still feel like you may vomit or are vomiting more than a day after you were given the numbing medicine. You get a rash. You have a fever. Get help right away if: You have a headache that lasts a long time. You have a very bad headache. Your vision is blurry, or you see two of a single object (double vision). You are dizzy or light-headed. You faint. Your arms or legs feel weak, tingle, or are numb for a long time. You have trouble breathing. You cannot pee. These symptoms may be an emergency. Get help right away. Call 911. Do not wait to see if the symptoms will go away. Do not drive yourself to the hospital. This information is not intended to replace advice given to you by your health care provider. Make sure you discuss any questions you have with your health care provider. Document Revised: 06/28/2022 Document Reviewed: 06/28/2022 Elsevier Patient Education  2024 [employer] General Anesthesia, Adult, Care After The following information offers guidance on how to care for yourself after your procedure. Your health care provider may also give you more specific instructions. If you have problems or questions, contact your health care provider. What can I expect after the procedure? After the procedure, it is common for people to: Have pain or discomfort at the IV  site. Have nausea or vomiting. Have a sore throat or hoarseness. Have trouble concentrating. Feel cold or chills. Feel weak, sleepy, or tired (fatigue). Have soreness and body aches. These can affect parts of the body that were not involved in surgery. Follow these instructions at home: For the time period you were told by your health care provider:  Rest. Do not participate in activities where you could fall or become injured. Do not drive or use machinery. Do not drink alcohol. Do not take sleeping pills or medicines that cause drowsiness. Do not make important decisions or sign legal documents. Do not take care of children on your own. General instructions Drink enough fluid to keep your urine pale yellow. If you have sleep apnea, surgery and certain medicines can increase your risk for breathing problems. Follow instructions from your health care provider about wearing your sleep device: Anytime you are sleeping, including during daytime naps. While taking prescription pain medicines, sleeping medicines, or medicines that make you drowsy. Return to your normal activities as told by your health care provider. Ask your health care provider what activities are safe for you. Take over-the-counter and prescription medicines only as told by your health care provider. Do not use any products that contain nicotine or tobacco. These products include cigarettes, chewing tobacco, and vaping devices, such as e-cigarettes. These can delay incision healing after surgery. If you need help quitting, ask your health care provider. Contact a health care provider if: You have nausea or vomiting that does not get better with medicine. You vomit every time you eat or drink. You have pain that does not get better with medicine. You cannot urinate or have bloody urine. You develop a skin rash. You have a fever. Get help right away if: You have trouble breathing. You have chest pain. You vomit  blood. These symptoms may be an emergency. Get help right away. Call 911. Do not wait to see if the symptoms will go away. Do not drive yourself to the hospital. Summary After the procedure, it is common to have a sore throat, hoarseness, nausea, vomiting, or to feel weak, sleepy, or fatigue. For the time period  you were told by your health care provider, do not drive or use machinery. Get help right away if you have difficulty breathing, have chest pain, or vomit blood. These symptoms may be an emergency. This information is not intended to replace advice given to you by your health care provider. Make sure you discuss any questions you have with your health care provider. Document Revised: 02/23/2022 Document Reviewed: 02/23/2022 Elsevier Patient Education  2024 [employer] How to Use Chlorhexidine Before Surgery Chlorhexidine gluconate (CHG) is a germ-killing (antiseptic) solution that is used to clean the skin. It can get rid of the bacteria that normally live on the skin and can keep them away for about 24 hours. To clean your skin with CHG, you may be given: A CHG solution to use in the shower or as part of a sponge bath. A prepackaged cloth that contains CHG. Cleaning your skin with CHG may help lower the risk for infection: While you are staying in the intensive care unit of the hospital. If you have a vascular access, such as a central line, to provide short-term or long-term access to your veins. If you have a catheter to drain urine from your bladder. If you are on a ventilator. A ventilator is a machine that helps you breathe by moving air in and out of your lungs. After surgery. What are the risks? Risks of using CHG include: A skin reaction. Hearing loss, if CHG gets in your ears and you have a perforated eardrum. Eye injury, if CHG gets in your eyes and is not rinsed out. The CHG product catching fire. Make sure that you avoid smoking and flames after applying CHG to your  skin. Do not use CHG: If you have a chlorhexidine allergy or have previously reacted to chlorhexidine. On babies younger than 59 months of age. How to use CHG solution Use CHG only as told by your health care provider, and follow the instructions on the label. Use the full amount of CHG as directed. Usually, this is one bottle. During a shower Follow these steps when using CHG solution during a shower (unless your health care provider gives you different instructions): Start the shower. Use your normal soap and shampoo to wash your face and hair. Turn off the shower or move out of the shower stream. Pour the CHG onto a clean washcloth. Do not use any type of brush or rough-edged sponge. Starting at your neck, lather your body down to your toes. Make sure you follow these instructions: If you will be having surgery, pay special attention to the part of your body where you will be having surgery. Scrub this area for at least 1 minute. Do not use CHG on your head or face. If the solution gets into your ears or eyes, rinse them well with water. Avoid your genital area. Avoid any areas of skin that have broken skin, cuts, or scrapes. Scrub your back and under your arms. Make sure to wash skin folds. Let the lather sit on your skin for 1-2 minutes or as long as told by your health care provider. Thoroughly rinse your entire body in the shower. Make sure that all body creases and crevices are rinsed well. Dry off with a clean towel. Do not put any substances on your body afterward--such as powder, lotion, or perfume--unless you are told to do so by your health care provider. Only use lotions that are recommended by the manufacturer. Put on clean clothes or pajamas. If it  is the night before your surgery, sleep in clean sheets.  During a sponge bath Follow these steps when using CHG solution during a sponge bath (unless your health care provider gives you different instructions): Use your normal  soap and shampoo to wash your face and hair. Pour the CHG onto a clean washcloth. Starting at your neck, lather your body down to your toes. Make sure you follow these instructions: If you will be having surgery, pay special attention to the part of your body where you will be having surgery. Scrub this area for at least 1 minute. Do not use CHG on your head or face. If the solution gets into your ears or eyes, rinse them well with water. Avoid your genital area. Avoid any areas of skin that have broken skin, cuts, or scrapes. Scrub your back and under your arms. Make sure to wash skin folds. Let the lather sit on your skin for 1-2 minutes or as long as told by your health care provider. Using a different clean, wet washcloth, thoroughly rinse your entire body. Make sure that all body creases and crevices are rinsed well. Dry off with a clean towel. Do not put any substances on your body afterward--such as powder, lotion, or perfume--unless you are told to do so by your health care provider. Only use lotions that are recommended by the manufacturer. Put on clean clothes or pajamas. If it is the night before your surgery, sleep in clean sheets. How to use CHG prepackaged cloths Only use CHG cloths as told by your health care provider, and follow the instructions on the label. Use the CHG cloth on clean, dry skin. Do not use the CHG cloth on your head or face unless your health care provider tells you to. When washing with the CHG cloth: Avoid your genital area. Avoid any areas of skin that have broken skin, cuts, or scrapes. Before surgery Follow these steps when using a CHG cloth to clean before surgery (unless your health care provider gives you different instructions): Using the CHG cloth, vigorously scrub the part of your body where you will be having surgery. Scrub using a back-and-forth motion for 3 minutes. The area on your body should be completely wet with CHG when you are done  scrubbing. Do not rinse. Discard the cloth and let the area air-dry. Do not put any substances on the area afterward, such as powder, lotion, or perfume. Put on clean clothes or pajamas. If it is the night before your surgery, sleep in clean sheets.  For general bathing Follow these steps when using CHG cloths for general bathing (unless your health care provider gives you different instructions). Use a separate CHG cloth for each area of your body. Make sure you wash between any folds of skin and between your fingers and toes. Wash your body in the following order, switching to a new cloth after each step: The front of your neck, shoulders, and chest. Both of your arms, under your arms, and your hands. Your stomach and groin area, avoiding the genitals. Your right leg and foot. Your left leg and foot. The back of your neck, your back, and your buttocks. Do not rinse. Discard the cloth and let the area air-dry. Do not put any substances on your body afterward--such as powder, lotion, or perfume--unless you are told to do so by your health care provider. Only use lotions that are recommended by the manufacturer. Put on clean clothes or pajamas. Contact a health care  provider if: Your skin gets irritated after scrubbing. You have questions about using your solution or cloth. You swallow any chlorhexidine. Call your local poison control center ([phone] in the [place] Get help right away if: Your eyes itch badly, or they become very red or swollen. Your skin itches badly and is red or swollen. Your hearing changes. You have trouble seeing. You have swelling or tingling in your mouth or throat. You have trouble breathing. These symptoms may represent a serious problem that is an emergency. Do not wait to see if the symptoms will go away. Get medical help right away. Call your local emergency services (911 in the [place] Do not drive yourself to the hospital. Summary Chlorhexidine  gluconate (CHG) is a germ-killing (antiseptic) solution that is used to clean the skin. Cleaning your skin with CHG may help to lower your risk for infection. You may be given CHG to use for bathing. It may be in a bottle or in a prepackaged cloth to use on your skin. Carefully follow your health care provider's instructions and the instructions on the product label. Do not use CHG if you have a chlorhexidine allergy. Contact your health care provider if your skin gets irritated after scrubbing. This information is not intended to replace advice given to you by your health care provider. Make sure you discuss any questions you have with your health care provider. Document Revised: 03/26/2022 Document Reviewed: 02/06/2021 Elsevier Patient Education  2023 [employer].

## 2023-05-21 ENCOUNTER — Encounter (HOSPITAL_COMMUNITY)
Admission: RE | Admit: 2023-05-21 | Discharge: 2023-05-21 | Disposition: A | Discharge status: Home / Self Care | Payer: 59 | Source: Ambulatory Visit | Attending: Orthopedic Surgery | Admitting: Orthopedic Surgery

## 2023-05-21 ENCOUNTER — Encounter: Payer: Self-pay | Admitting: Gastroenterology

## 2023-05-21 ENCOUNTER — Ambulatory Visit (INDEPENDENT_AMBULATORY_CARE_PROVIDER_SITE_OTHER): Payer: 59 | Admitting: Gastroenterology

## 2023-05-21 ENCOUNTER — Encounter (HOSPITAL_COMMUNITY): Payer: Self-pay

## 2023-05-21 VITALS — BP 119/75 | HR 76 | Temp 97.1°F | Ht 62.0 in | Wt 153.8 lb

## 2023-05-21 VITALS — BP 119/75 | HR 76 | Temp 97.1°F | Resp 18 | Ht 62.0 in | Wt 153.8 lb

## 2023-05-21 DIAGNOSIS — R1319 Other dysphagia: Secondary | ICD-10-CM

## 2023-05-21 DIAGNOSIS — Z01818 Encounter for other preprocedural examination: Secondary | ICD-10-CM | POA: Insufficient documentation

## 2023-05-21 DIAGNOSIS — M1711 Unilateral primary osteoarthritis, right knee: Secondary | ICD-10-CM | POA: Insufficient documentation

## 2023-05-21 DIAGNOSIS — K219 Gastro-esophageal reflux disease without esophagitis: Secondary | ICD-10-CM

## 2023-05-21 DIAGNOSIS — I10 Essential (primary) hypertension: Secondary | ICD-10-CM | POA: Insufficient documentation

## 2023-05-21 HISTORY — DX: Hypothyroidism, unspecified: E03.9

## 2023-05-21 HISTORY — DX: Chronic obstructive pulmonary disease, unspecified: J44.9

## 2023-05-21 LAB — TYPE AND SCREEN
ABO/RH(D): O NEG
Antibody Screen: NEGATIVE

## 2023-05-21 LAB — CBC WITH DIFFERENTIAL/PLATELET
Abs Immature Granulocytes: 0.07 10*3/uL (ref 0.00–0.07)
Basophils Absolute: 0 10*3/uL (ref 0.0–0.1)
Basophils Relative: 1 %
Eosinophils Absolute: 0.2 10*3/uL (ref 0.0–0.5)
Eosinophils Relative: 3 %
HCT: 33.6 % — ABNORMAL LOW (ref 36.0–46.0)
Hemoglobin: 10.2 g/dL — ABNORMAL LOW (ref 12.0–15.0)
Immature Granulocytes: 1 %
Lymphocytes Relative: 35 %
Lymphs Abs: 1.9 10*3/uL (ref 0.7–4.0)
MCH: 25.8 pg — ABNORMAL LOW (ref 26.0–34.0)
MCHC: 30.4 g/dL (ref 30.0–36.0)
MCV: 84.8 fL (ref 80.0–100.0)
Monocytes Absolute: 0.3 10*3/uL (ref 0.1–1.0)
Monocytes Relative: 6 %
Neutro Abs: 3 10*3/uL (ref 1.7–7.7)
Neutrophils Relative %: 54 %
Platelets: 258 10*3/uL (ref 150–400)
RBC: 3.96 MIL/uL (ref 3.87–5.11)
RDW: 16.4 % — ABNORMAL HIGH (ref 11.5–15.5)
WBC: 5.5 10*3/uL (ref 4.0–10.5)
nRBC: 0 % (ref 0.0–0.2)

## 2023-05-21 LAB — BASIC METABOLIC PANEL
Anion gap: 8 (ref 5–15)
BUN: 28 mg/dL — ABNORMAL HIGH (ref 8–23)
CO2: 22 mmol/L (ref 22–32)
Calcium: 8.7 mg/dL — ABNORMAL LOW (ref 8.9–10.3)
Chloride: 107 mmol/L (ref 98–111)
Creatinine, Ser: 0.66 mg/dL (ref 0.44–1.00)
GFR, Estimated: >60 mL/min (ref >60)
Glucose, Bld: 102 mg/dL — ABNORMAL HIGH (ref 70–99)
Potassium: 3.8 mmol/L (ref 3.5–5.1)
Sodium: 137 mmol/L (ref 135–145)

## 2023-05-21 LAB — SURGICAL PCR SCREEN
MRSA, PCR: NEGATIVE
Staphylococcus aureus: NEGATIVE

## 2023-05-21 LAB — PREPARE RBC (CROSSMATCH)

## 2023-05-21 NOTE — Patient Instructions (Addendum)
Need to avoid rough textures.  Continue to chop meats finely if needed.  Continue omeprazole 40 mg twice daily.  Will continue this for another 6 months and then consider weaning back to once daily.  If you are able to tolerate your secretions or feel like something is stuck in your throat please go to the ED emergently.  Continue to rinse her mouth daily after using Symbicort.  We will plan to follow-up in 6 months, sooner if needed.  It was a pleasure to see you today. I want to create trusting relationships with patients. If you receive a survey regarding your visit,  I greatly appreciate you taking time to fill this out on paper or through your MyChart. I value your feedback.  Brooke Bonito, MSN, FNP-BC, AGACNP-BC [facility]

## 2023-05-24 NOTE — H&P (Signed)
Chief Complaint  Patient presents with   Knee Pain    Bilateral wants to discuss surgery    77-year-old female who I followed for bilateral knee pain.  She has rheumatoid arthritis.  She has a history of COPD but is not on oxygen and does not use a CPAP presents now with worsening pain requesting surgery on her right knee  She has a myriad of symptoms which include night pain, trouble getting up and down from a seated position, back pain when standing, requiring a shopping cart to do her shopping and legs giving way  So she has some signs of spinal stenosis  Review of systems cough occasional wheezing No chest pain No abdominal pain but to see GI prior to surgery Rheumatology appointment scheduled for next week  Past Medical History:  Diagnosis Date   Acute pain of right knee 01/30/2011   Qualifier: Diagnosis of  By: Simpson MD, Fatime     Anemia    Arthritis    Carpal tunnel syndrome, bilateral    Diverticulosis dx 2011   GERD (gastroesophageal reflux disease)    Headache    Hypertension 06/02/2018   Low back pain    Obesity    Rheumatoid arthritis(714.0)    Past Surgical History:  Procedure Laterality Date   bilateral foot surgery     COLONOSCOPY  2011   Dr. Fields: 3mm sessile polyp (tubular adenoma), pancolonic diverticulosis   COLONOSCOPY N/A 09/26/2017   diverticulosis, redundant colon   COLONOSCOPY WITH PROPOFOL N/A 05/01/2022   Procedure: COLONOSCOPY WITH PROPOFOL;  Surgeon: Carver, Charles K, DO;  Location: [facility];  Service: Endoscopy;  Laterality: N/A;  11:30am   ESOPHAGOGASTRODUODENOSCOPY N/A 09/16/2017   Dr. Fields: multiple gastric polyps, (fundic gland), mild gastritis, path with duodenal mucosa with focal villous blunting and surface erosions, moderate chronic gastritis with intestinal metaplasia and reactive changes. no H.pylori, dysplasia, or malignancy   FLEXIBLE SIGMOIDOSCOPY N/A 09/16/2017   Procedure: FLEXIBLE SIGMOIDOSCOPY;  Surgeon: Fields,  Sandi L, MD;  Location: [facility];  Service: Endoscopy;  Laterality: N/A;   HEMORRHOID SURGERY  1980   KNEE ARTHROSCOPY WITH LATERAL MENISECTOMY Right 10/16/2018   Procedure: KNEE ARTHROSCOPY WITH LATERAL MENISCECTOMY;  Surgeon: Tenee Wish E, MD;  Location: [facility];  Service: Orthopedics;  Laterality: Right;   OOPHORECTOMY Right    roophorectomy for benign disease  02/2010   Dr. Ferguson   tonsillectomy and adenoidctomy in childhood     TUBAL LIGATION  1977   Breast Cancer-relatedfamily history is not on file.,  Family History  Problem Relation Age of Onset   Heart failure Mother    Cancer Mother 65       oral   Heart failure Father    Arthritis Sister    Heart attack Brother    Thyroid disease Daughter    Hypertension Daughter    Hypertension Daughter    Thyroid disease Daughter    Heart failure Sister    Diabetes Sister    Hypertension Sister    Leukemia Brother    Colon cancer Neg Hx    Colon polyps Neg Hx    Social History   Tobacco Use   Smoking status: Former    Packs/day: 2.00    Years: 25.00    Additional pack years: 0.00    Total pack years: 50.00    Types: Cigarettes    Quit date: 05/12/2014    Years since quitting: 8.9   Smokeless tobacco: Never  Vaping Use     Vaping Use: Never used  Substance Use Topics   Alcohol use: No   Drug use: No   There were no vitals taken for this visit.  Physical Exam Vitals and nursing note reviewed.  Constitutional:      Appearance: Normal appearance.  HENT:     Head: Normocephalic and atraumatic.  Eyes:     General: No scleral icterus.       Right eye: No discharge.        Left eye: No discharge.     Extraocular Movements: Extraocular movements intact.     Conjunctiva/sclera: Conjunctivae normal.     Pupils: Pupils are equal, round, and reactive to light.  Cardiovascular:     Rate and Rhythm: Normal rate.     Pulses: Normal pulses.  Skin:    General: Skin is warm and dry.     Capillary Refill:  Capillary refill takes less than 2 seconds.  Neurological:     General: No focal deficit present.     Mental Status: She is alert and oriented to person, place, and time.  Psychiatric:        Mood and Affect: Mood normal.        Behavior: Behavior normal.        Thought Content: Thought content normal.        Judgment: Judgment normal.    Right knee is in valgus alignment there is tenderness along the lateral joint line there appears to be an effusion she does not have any laxity her range of motion passive I can stretch her out to full extension active she is short of extension by 5 degrees  Flexion is 125 degrees.  X-rays show valgus arthritis 15 degrees sclerosis subchondral bone laterally and medially squaring of the medial femoral condyle patellofemoral osteophyte and joint space narrowing without subluxation  Osteopenia  Consistent findings with rheumatoid arthritis  Recommend MRI of the spine to evaluate the spinal stenosis prior to surgery  Plan for right total knee arthroplasty  The procedure has been fully reviewed with the patient; The risks and benefits of surgery have been discussed and explained and understood. Alternative treatment has also been reviewed, questions were encouraged and answered. The postoperative plan is also been reviewed.   

## 2023-05-27 DIAGNOSIS — M0579 Rheumatoid arthritis with rheumatoid factor of multiple sites without organ or systems involvement: Secondary | ICD-10-CM | POA: Diagnosis not present

## 2023-05-28 ENCOUNTER — Encounter (HOSPITAL_COMMUNITY): Payer: Self-pay | Admitting: Orthopedic Surgery

## 2023-05-28 ENCOUNTER — Other Ambulatory Visit: Payer: Self-pay

## 2023-05-28 ENCOUNTER — Observation Stay (HOSPITAL_COMMUNITY)
Admission: RE | Admit: 2023-05-28 | Discharge: 2023-05-29 | Disposition: A | Discharge status: Home Health | Payer: 59 | Attending: Orthopedic Surgery | Admitting: Orthopedic Surgery

## 2023-05-28 ENCOUNTER — Ambulatory Visit (HOSPITAL_BASED_OUTPATIENT_CLINIC_OR_DEPARTMENT_OTHER): Payer: 59 | Admitting: Anesthesiology

## 2023-05-28 ENCOUNTER — Encounter (HOSPITAL_COMMUNITY)
Admission: RE | Disposition: A | Discharge status: Home Health | Payer: Self-pay | Source: Home / Self Care | Attending: Orthopedic Surgery

## 2023-05-28 ENCOUNTER — Ambulatory Visit (HOSPITAL_COMMUNITY): Payer: 59 | Admitting: Anesthesiology

## 2023-05-28 ENCOUNTER — Ambulatory Visit (HOSPITAL_COMMUNITY): Payer: 59

## 2023-05-28 DIAGNOSIS — M1711 Unilateral primary osteoarthritis, right knee: Secondary | ICD-10-CM | POA: Diagnosis not present

## 2023-05-28 DIAGNOSIS — Z79899 Other long term (current) drug therapy: Secondary | ICD-10-CM | POA: Insufficient documentation

## 2023-05-28 DIAGNOSIS — J449 Chronic obstructive pulmonary disease, unspecified: Secondary | ICD-10-CM

## 2023-05-28 DIAGNOSIS — Z471 Aftercare following joint replacement surgery: Secondary | ICD-10-CM | POA: Diagnosis not present

## 2023-05-28 DIAGNOSIS — I10 Essential (primary) hypertension: Secondary | ICD-10-CM | POA: Insufficient documentation

## 2023-05-28 DIAGNOSIS — Z87891 Personal history of nicotine dependence: Secondary | ICD-10-CM

## 2023-05-28 DIAGNOSIS — G8918 Other acute postprocedural pain: Secondary | ICD-10-CM | POA: Diagnosis not present

## 2023-05-28 DIAGNOSIS — Z96651 Presence of right artificial knee joint: Secondary | ICD-10-CM | POA: Diagnosis not present

## 2023-05-28 HISTORY — PX: TOTAL KNEE ARTHROPLASTY: SHX125

## 2023-05-28 LAB — ABO/RH: ABO/RH(D): O NEG

## 2023-05-28 SURGERY — ARTHROPLASTY, KNEE, TOTAL
Anesthesia: Spinal | Site: Knee | Laterality: Right

## 2023-05-28 MED ORDER — DEXAMETHASONE SODIUM PHOSPHATE 10 MG/ML IJ SOLN
10.0000 mg | Freq: Once | INTRAMUSCULAR | Status: AC
  Administered 2023-05-29: 10 mg via INTRAVENOUS
  Filled 2023-05-28: qty 1

## 2023-05-28 MED ORDER — BIOTIN 2.5 MG PO TABS: ORAL_TABLET | Freq: Every morning | ORAL | Status: DC

## 2023-05-28 MED ORDER — DEXAMETHASONE SODIUM PHOSPHATE 4 MG/ML IJ SOLN
INTRAMUSCULAR | Status: DC | PRN
  Administered 2023-05-28: 8 mg via PERINEURAL

## 2023-05-28 MED ORDER — VITAMIN D 25 MCG (1000 UNIT) PO TABS
2000.0000 [IU] | ORAL_TABLET | Freq: Every morning | ORAL | Status: DC
  Administered 2023-05-29: 2000 [IU] via ORAL
  Filled 2023-05-28: qty 2

## 2023-05-28 MED ORDER — ACETAMINOPHEN 500 MG PO TABS
500.0000 mg | ORAL_TABLET | Freq: Four times a day (QID) | ORAL | Status: AC
  Administered 2023-05-28 – 2023-05-29 (×4): 500 mg via ORAL
  Filled 2023-05-28 (×4): qty 1

## 2023-05-28 MED ORDER — BUPIVACAINE-MELOXICAM ER 200-6 MG/7ML IJ SOLN
INTRAMUSCULAR | Status: AC
  Filled 2023-05-28: qty 2

## 2023-05-28 MED ORDER — HYDROCODONE-ACETAMINOPHEN 7.5-325 MG PO TABS: 1.0000 | ORAL_TABLET | ORAL | Status: DC | PRN

## 2023-05-28 MED ORDER — METOCLOPRAMIDE HCL 10 MG PO TABS: 5.0000 mg | ORAL_TABLET | Freq: Three times a day (TID) | ORAL | Status: DC | PRN

## 2023-05-28 MED ORDER — ONDANSETRON HCL 4 MG/2ML IJ SOLN: 4.0000 mg | Freq: Once | INTRAMUSCULAR | Status: DC | PRN

## 2023-05-28 MED ORDER — DIPHENHYDRAMINE HCL 12.5 MG/5ML PO ELIX: 12.5000 mg | ORAL_SOLUTION | ORAL | Status: DC | PRN

## 2023-05-28 MED ORDER — PANTOPRAZOLE SODIUM 40 MG PO TBEC
40.0000 mg | DELAYED_RELEASE_TABLET | Freq: Every day | ORAL | Status: DC
  Administered 2023-05-29: 40 mg via ORAL
  Filled 2023-05-28: qty 1

## 2023-05-28 MED ORDER — TAB-A-VITE/IRON PO TABS
1.0000 | ORAL_TABLET | Freq: Every morning | ORAL | Status: DC
  Administered 2023-05-29: 1 via ORAL
  Filled 2023-05-28: qty 1

## 2023-05-28 MED ORDER — MENTHOL 3 MG MT LOZG: 1.0000 | LOZENGE | OROMUCOSAL | Status: DC | PRN

## 2023-05-28 MED ORDER — METOCLOPRAMIDE HCL 5 MG/ML IJ SOLN: 5.0000 mg | Freq: Three times a day (TID) | INTRAMUSCULAR | Status: DC | PRN

## 2023-05-28 MED ORDER — LEVOTHYROXINE SODIUM 75 MCG PO TABS
75.0000 ug | ORAL_TABLET | Freq: Every day | ORAL | Status: DC
  Administered 2023-05-29: 75 ug via ORAL
  Filled 2023-05-28: qty 1

## 2023-05-28 MED ORDER — PROPOFOL 10 MG/ML IV BOLUS
INTRAVENOUS | Status: AC
  Filled 2023-05-28: qty 20

## 2023-05-28 MED ORDER — TRANEXAMIC ACID-NACL 1000-0.7 MG/100ML-% IV SOLN
1000.0000 mg | INTRAVENOUS | Status: AC
  Administered 2023-05-28: 1000 mg via INTRAVENOUS
  Filled 2023-05-28: qty 100

## 2023-05-28 MED ORDER — CEFAZOLIN SODIUM-DEXTROSE 2-4 GM/100ML-% IV SOLN
2.0000 g | Freq: Four times a day (QID) | INTRAVENOUS | Status: AC
  Administered 2023-05-28 (×2): 2 g via INTRAVENOUS
  Filled 2023-05-28 (×2): qty 100

## 2023-05-28 MED ORDER — AMLODIPINE BESYLATE 5 MG PO TABS
5.0000 mg | ORAL_TABLET | Freq: Every day | ORAL | Status: DC
  Administered 2023-05-29: 5 mg via ORAL
  Filled 2023-05-28: qty 1

## 2023-05-28 MED ORDER — DEXAMETHASONE SODIUM PHOSPHATE 10 MG/ML IJ SOLN
INTRAMUSCULAR | Status: AC
  Filled 2023-05-28: qty 1

## 2023-05-28 MED ORDER — LIDOCAINE HCL (PF) 1 % IJ SOLN
INTRAMUSCULAR | Status: AC
  Filled 2023-05-28: qty 30

## 2023-05-28 MED ORDER — CEFAZOLIN SODIUM-DEXTROSE 2-4 GM/100ML-% IV SOLN
2.0000 g | INTRAVENOUS | Status: AC
  Administered 2023-05-28: 2 g via INTRAVENOUS
  Filled 2023-05-28: qty 100

## 2023-05-28 MED ORDER — MIDAZOLAM HCL 2 MG/2ML IJ SOLN
2.0000 mg | Freq: Once | INTRAMUSCULAR | Status: AC
  Administered 2023-05-28: 2 mg via INTRAVENOUS
  Filled 2023-05-28: qty 2

## 2023-05-28 MED ORDER — BUPIVACAINE-MELOXICAM ER 200-6 MG/7ML IJ SOLN
INTRAMUSCULAR | Status: DC | PRN
  Administered 2023-05-28: 400 mg

## 2023-05-28 MED ORDER — MIDAZOLAM HCL 2 MG/2ML IJ SOLN
INTRAMUSCULAR | Status: AC
  Filled 2023-05-28: qty 2

## 2023-05-28 MED ORDER — ONDANSETRON HCL 4 MG/2ML IJ SOLN
INTRAMUSCULAR | Status: DC | PRN
  Administered 2023-05-28: 4 mg via INTRAVENOUS

## 2023-05-28 MED ORDER — ONDANSETRON HCL 4 MG PO TABS: 4.0000 mg | ORAL_TABLET | Freq: Four times a day (QID) | ORAL | Status: DC | PRN

## 2023-05-28 MED ORDER — ACETAMINOPHEN 325 MG PO TABS: 325.0000 mg | ORAL_TABLET | Freq: Four times a day (QID) | ORAL | Status: DC | PRN

## 2023-05-28 MED ORDER — HYDROCODONE-ACETAMINOPHEN 5-325 MG PO TABS: 1.0000 | ORAL_TABLET | ORAL | Status: DC | PRN

## 2023-05-28 MED ORDER — TRANEXAMIC ACID-NACL 1000-0.7 MG/100ML-% IV SOLN
1000.0000 mg | Freq: Once | INTRAVENOUS | Status: AC
  Administered 2023-05-28: 1000 mg via INTRAVENOUS
  Filled 2023-05-28: qty 100

## 2023-05-28 MED ORDER — ONDANSETRON HCL 4 MG/2ML IJ SOLN: 4.0000 mg | Freq: Four times a day (QID) | INTRAMUSCULAR | Status: DC | PRN

## 2023-05-28 MED ORDER — PREGABALIN 50 MG PO CAPS
50.0000 mg | ORAL_CAPSULE | Freq: Three times a day (TID) | ORAL | Status: DC
  Administered 2023-05-28: 50 mg via ORAL
  Filled 2023-05-28: qty 1

## 2023-05-28 MED ORDER — MORPHINE SULFATE (PF) 2 MG/ML IV SOLN: 0.5000 mg | INTRAVENOUS | Status: DC | PRN

## 2023-05-28 MED ORDER — ASPIRIN 81 MG PO CHEW
81.0000 mg | CHEWABLE_TABLET | Freq: Two times a day (BID) | ORAL | Status: DC
  Administered 2023-05-28 – 2023-05-29 (×2): 81 mg via ORAL
  Filled 2023-05-28 (×2): qty 1

## 2023-05-28 MED ORDER — ROPIVACAINE HCL 5 MG/ML IJ SOLN
INTRAMUSCULAR | Status: AC
  Filled 2023-05-28: qty 30

## 2023-05-28 MED ORDER — FLUTICASONE PROPIONATE 50 MCG/ACT NA SUSP: 2.0000 | Freq: Every day | NASAL | Status: DC | PRN

## 2023-05-28 MED ORDER — CELECOXIB 100 MG PO CAPS: 200.0000 mg | ORAL_CAPSULE | Freq: Every day | ORAL | Status: DC

## 2023-05-28 MED ORDER — OXYCODONE HCL 5 MG PO TABS
5.0000 mg | ORAL_TABLET | ORAL | Status: DC
  Administered 2023-05-28: 5 mg via ORAL
  Filled 2023-05-28: qty 1

## 2023-05-28 MED ORDER — PHENOL 1.4 % MT LIQD: 1.0000 | OROMUCOSAL | Status: DC | PRN

## 2023-05-28 MED ORDER — DEXAMETHASONE SODIUM PHOSPHATE 4 MG/ML IJ SOLN
INTRAMUSCULAR | Status: AC
  Filled 2023-05-28: qty 2

## 2023-05-28 MED ORDER — LIDOCAINE HCL (PF) 1 % IJ SOLN
INTRAMUSCULAR | Status: DC | PRN
  Administered 2023-05-28: 3 mL

## 2023-05-28 MED ORDER — FOLIC ACID 1 MG PO TABS
1.0000 mg | ORAL_TABLET | Freq: Every morning | ORAL | Status: DC
  Administered 2023-05-29: 1 mg via ORAL
  Filled 2023-05-28: qty 1

## 2023-05-28 MED ORDER — CHLORHEXIDINE GLUCONATE 0.12 % MT SOLN
15.0000 mL | Freq: Once | OROMUCOSAL | Status: AC
  Administered 2023-05-28: 15 mL via OROMUCOSAL
  Filled 2023-05-28: qty 15

## 2023-05-28 MED ORDER — METHOCARBAMOL 1000 MG/10ML IJ SOLN: 500.0000 mg | Freq: Four times a day (QID) | INTRAVENOUS | Status: DC | PRN

## 2023-05-28 MED ORDER — EPHEDRINE SULFATE (PRESSORS) 50 MG/ML IJ SOLN
INTRAMUSCULAR | Status: DC | PRN
  Administered 2023-05-28: 10 mg via INTRAVENOUS
  Administered 2023-05-28: 7 mg via INTRAVENOUS

## 2023-05-28 MED ORDER — SODIUM CHLORIDE 0.9 % IV SOLN: INTRAVENOUS | Status: DC

## 2023-05-28 MED ORDER — ROPIVACAINE HCL 5 MG/ML IJ SOLN
INTRAMUSCULAR | Status: DC | PRN
  Administered 2023-05-28: 28 mL via PERINEURAL

## 2023-05-28 MED ORDER — LACTATED RINGERS IV SOLN: INTRAVENOUS | Status: DC

## 2023-05-28 MED ORDER — METOPROLOL TARTRATE 5 MG/5ML IV SOLN
INTRAVENOUS | Status: AC
  Filled 2023-05-28: qty 5

## 2023-05-28 MED ORDER — FENTANYL CITRATE (PF) 100 MCG/2ML IJ SOLN
INTRAMUSCULAR | Status: DC | PRN
  Administered 2023-05-28 (×2): 25 ug via INTRAVENOUS

## 2023-05-28 MED ORDER — HYDROMORPHONE HCL 1 MG/ML IJ SOLN: 0.2500 mg | INTRAMUSCULAR | Status: DC | PRN

## 2023-05-28 MED ORDER — ORAL CARE MOUTH RINSE: 15.0000 mL | Freq: Once | OROMUCOSAL | Status: AC

## 2023-05-28 MED ORDER — EPHEDRINE 5 MG/ML INJ
INTRAVENOUS | Status: AC
  Filled 2023-05-28: qty 5

## 2023-05-28 MED ORDER — MIDAZOLAM HCL 5 MG/5ML IJ SOLN
INTRAMUSCULAR | Status: DC | PRN
  Administered 2023-05-28: 1 mg via INTRAVENOUS

## 2023-05-28 MED ORDER — CELECOXIB 100 MG PO CAPS
200.0000 mg | ORAL_CAPSULE | Freq: Two times a day (BID) | ORAL | Status: DC
  Administered 2023-05-28 – 2023-05-29 (×3): 200 mg via ORAL
  Filled 2023-05-28 (×3): qty 2

## 2023-05-28 MED ORDER — SODIUM CHLORIDE 0.9 % IR SOLN
Status: DC | PRN
  Administered 2023-05-28: 3000 mL

## 2023-05-28 MED ORDER — DOCUSATE SODIUM 100 MG PO CAPS
100.0000 mg | ORAL_CAPSULE | Freq: Two times a day (BID) | ORAL | Status: DC
  Administered 2023-05-28 – 2023-05-29 (×2): 100 mg via ORAL
  Filled 2023-05-28 (×2): qty 1

## 2023-05-28 MED ORDER — POLYETHYLENE GLYCOL 3350 17 G PO PACK: 17.0000 g | PACK | Freq: Every day | ORAL | Status: DC | PRN

## 2023-05-28 MED ORDER — BUPIVACAINE IN DEXTROSE 0.75-8.25 % IT SOLN
INTRATHECAL | Status: DC | PRN
  Administered 2023-05-28: 13 mg via INTRATHECAL

## 2023-05-28 MED ORDER — ONDANSETRON HCL 4 MG/2ML IJ SOLN
4.0000 mg | Freq: Once | INTRAMUSCULAR | Status: AC
  Administered 2023-05-28: 4 mg via INTRAVENOUS
  Filled 2023-05-28: qty 2

## 2023-05-28 MED ORDER — BENZONATATE 100 MG PO CAPS: 200.0000 mg | ORAL_CAPSULE | Freq: Two times a day (BID) | ORAL | Status: DC | PRN

## 2023-05-28 MED ORDER — GABAPENTIN 100 MG PO CAPS
100.0000 mg | ORAL_CAPSULE | Freq: Every day | ORAL | Status: DC
  Administered 2023-05-28: 100 mg via ORAL
  Filled 2023-05-28: qty 1

## 2023-05-28 MED ORDER — MONTELUKAST SODIUM 10 MG PO TABS
10.0000 mg | ORAL_TABLET | Freq: Every day | ORAL | Status: DC
  Administered 2023-05-28: 10 mg via ORAL
  Filled 2023-05-28: qty 1

## 2023-05-28 MED ORDER — OYSTER SHELL CALCIUM/D3 500-5 MG-MCG PO TABS
ORAL_TABLET | Freq: Every evening | ORAL | Status: DC
  Administered 2023-05-28: 1 via ORAL
  Filled 2023-05-28: qty 1

## 2023-05-28 MED ORDER — POVIDONE-IODINE 10 % EX SWAB: 2.0000 | Freq: Once | CUTANEOUS | Status: DC

## 2023-05-28 MED ORDER — ARTIFICIAL TEARS OPHTHALMIC OINT
TOPICAL_OINTMENT | OPHTHALMIC | Status: AC
  Filled 2023-05-28: qty 3.5

## 2023-05-28 MED ORDER — METHOCARBAMOL 500 MG PO TABS: 500.0000 mg | ORAL_TABLET | Freq: Four times a day (QID) | ORAL | Status: DC | PRN

## 2023-05-28 MED ORDER — LIDOCAINE HCL (PF) 2 % IJ SOLN
INTRAMUSCULAR | Status: AC
  Filled 2023-05-28: qty 10

## 2023-05-28 MED ORDER — 0.9 % SODIUM CHLORIDE (POUR BTL) OPTIME
TOPICAL | Status: DC | PRN
  Administered 2023-05-28: 1000 mL

## 2023-05-28 MED ORDER — DEXAMETHASONE SODIUM PHOSPHATE 10 MG/ML IJ SOLN
INTRAMUSCULAR | Status: DC | PRN
  Administered 2023-05-28: 8 mg via INTRAVENOUS

## 2023-05-28 MED ORDER — VITAMIN B-12 100 MCG PO TABS
500.0000 ug | ORAL_TABLET | Freq: Every morning | ORAL | Status: DC
  Administered 2023-05-29: 500 ug via ORAL
  Filled 2023-05-28: qty 5

## 2023-05-28 MED ORDER — IPRATROPIUM-ALBUTEROL 0.5-2.5 (3) MG/3ML IN SOLN: 3.0000 mL | Freq: Four times a day (QID) | RESPIRATORY_TRACT | Status: DC | PRN

## 2023-05-28 MED ORDER — PROPOFOL 500 MG/50ML IV EMUL
INTRAVENOUS | Status: DC | PRN
  Administered 2023-05-28: 40 ug/kg/min via INTRAVENOUS

## 2023-05-28 MED ORDER — ONDANSETRON HCL 4 MG/2ML IJ SOLN
INTRAMUSCULAR | Status: AC
  Filled 2023-05-28: qty 2

## 2023-05-28 MED ORDER — FENTANYL CITRATE (PF) 100 MCG/2ML IJ SOLN
INTRAMUSCULAR | Status: AC
  Filled 2023-05-28: qty 2

## 2023-05-28 MED ORDER — TRAMADOL HCL 50 MG PO TABS
50.0000 mg | ORAL_TABLET | Freq: Four times a day (QID) | ORAL | Status: DC
  Administered 2023-05-28 – 2023-05-29 (×5): 50 mg via ORAL
  Filled 2023-05-28 (×5): qty 1

## 2023-05-28 SURGICAL SUPPLY — 63 items
ATTUNE PS FEM RT SZ 4 CEM KNEE (Femur) IMPLANT
BANDAGE ESMARK 6X9 LF (GAUZE/BANDAGES/DRESSINGS) ×1 IMPLANT
BASEPLATE TIB CMT FB PCKT SZ4 (Stem) IMPLANT
BLADE SAGITTAL 25.0X1.27X90 (BLADE) ×1 IMPLANT
BLADE SAW SGTL 11.0X1.19X90.0M (BLADE) ×1 IMPLANT
BLADE SURG SZ10 CARB STEEL (BLADE) ×1 IMPLANT
BNDG CMPR 9X6 STRL LF SNTH (GAUZE/BANDAGES/DRESSINGS) ×1
BNDG ESMARK 6X9 LF (GAUZE/BANDAGES/DRESSINGS) ×1
BSPLAT TIB 4 CMNT FX BRNG STRL (Stem) ×1 IMPLANT
CEMENT HV SMART SET (Cement) ×2 IMPLANT
CLOTH BEACON ORANGE TIMEOUT ST (SAFETY) ×1 IMPLANT
COOLER ICEMAN CLASSIC (MISCELLANEOUS) ×1 IMPLANT
COVER LIGHT HANDLE STERIS (MISCELLANEOUS) ×2 IMPLANT
CUFF TOURN SGL QUICK 34 (TOURNIQUET CUFF) ×1
CUFF TRNQT CYL 34X4.125X (TOURNIQUET CUFF) ×1 IMPLANT
DRAPE BACK TABLE (DRAPES) ×1 IMPLANT
DRAPE EXTREMITY T 121X128X90 (DISPOSABLE) ×1 IMPLANT
DRESSING AQUACEL AG ADV 3.5X12 (MISCELLANEOUS) ×1 IMPLANT
DRSG AQUACEL AG ADV 3.5X12 (MISCELLANEOUS) ×1
DURAPREP 26ML APPLICATOR (WOUND CARE) ×2 IMPLANT
ELECT REM PT RETURN 9FT ADLT (ELECTROSURGICAL) ×1
ELECTRODE REM PT RTRN 9FT ADLT (ELECTROSURGICAL) ×1 IMPLANT
GLOVE BIO SURGEON STRL SZ7 (GLOVE) IMPLANT
GLOVE BIOGEL PI IND STRL 7.0 (GLOVE) ×3 IMPLANT
GLOVE BIOGEL PI IND STRL 8.5 (GLOVE) ×1 IMPLANT
GLOVE ECLIPSE 6.5 STRL STRAW (GLOVE) IMPLANT
GLOVE SKINSENSE STRL SZ8.0 LF (GLOVE) ×1 IMPLANT
GLOVE SS BIOGEL STRL SZ 6.5 (GLOVE) IMPLANT
GOWN STRL REUS W/TWL LRG LVL3 (GOWN DISPOSABLE) ×3 IMPLANT
GOWN STRL REUS W/TWL XL LVL3 (GOWN DISPOSABLE) ×1 IMPLANT
HANDPIECE INTERPULSE COAX TIP (DISPOSABLE) ×1
HOOD W/PEELAWAY (MISCELLANEOUS) ×4 IMPLANT
INSERT TIB ATTUNE FB SZ4X5 (Insert) IMPLANT
INST SET MAJOR BONE (KITS) ×1 IMPLANT
IV NS IRRIG 3000ML ARTHROMATIC (IV SOLUTION) ×1 IMPLANT
KIT BLADEGUARD II DBL (SET/KITS/TRAYS/PACK) ×1 IMPLANT
KIT TURNOVER KIT A (KITS) ×1 IMPLANT
MANIFOLD NEPTUNE II (INSTRUMENTS) ×1 IMPLANT
MARKER SKIN DUAL TIP RULER LAB (MISCELLANEOUS) ×1 IMPLANT
NS IRRIG 1000ML POUR BTL (IV SOLUTION) ×1 IMPLANT
PACK TOTAL JOINT (CUSTOM PROCEDURE TRAY) ×1 IMPLANT
PAD ARMBOARD 7.5X6 YLW CONV (MISCELLANEOUS) ×1 IMPLANT
PAD COLD SHLDR SM WRAP-ON (PAD) ×1 IMPLANT
PATELLA MEDIAL ATTUN 35MM KNEE (Knees) IMPLANT
PILLOW KNEE EXTENSION 0 DEG (MISCELLANEOUS) ×1 IMPLANT
PIN/DRILL PACK ORTHO 1/8X3.0 (PIN) ×1 IMPLANT
SAW OSC TIP CART 19.5X105X1.3 (SAW) ×1 IMPLANT
SET BASIN LINEN APH (SET/KITS/TRAYS/PACK) ×1 IMPLANT
SET HNDPC FAN SPRY TIP SCT (DISPOSABLE) ×1 IMPLANT
SOLUTION IRRIG SURGIPHOR (IV SOLUTION) ×1 IMPLANT
SPONGE T-LAP 18X18 ~~LOC~~+RFID (SPONGE) IMPLANT
STAPLER VISISTAT 35W (STAPLE) ×1 IMPLANT
SUT BRALON NAB BRD #1 30IN (SUTURE) ×1 IMPLANT
SUT MNCRL 0 VIOLET CTX 36 (SUTURE) ×1 IMPLANT
SUT MON AB 0 CT1 (SUTURE) ×1 IMPLANT
SUT VIC AB 1 CT1 27 (SUTURE) ×2
SUT VIC AB 1 CT1 27XBRD ANTBC (SUTURE) IMPLANT
SYR BULB IRRIG 60ML STRL (SYRINGE) ×1 IMPLANT
TOWEL OR 17X26 4PK STRL BLUE (TOWEL DISPOSABLE) ×1 IMPLANT
TOWER CARTRIDGE SMART MIX (DISPOSABLE) ×1 IMPLANT
TRAY FOLEY MTR SLVR 16FR STAT (SET/KITS/TRAYS/PACK) ×1 IMPLANT
WATER STERILE IRR 1000ML POUR (IV SOLUTION) ×2 IMPLANT
YANKAUER SUCT 12FT TUBE ARGYLE (SUCTIONS) ×1 IMPLANT

## 2023-05-28 NOTE — Addendum Note (Signed)
Addendum  created 05/28/23 1531 by Molli Barrows, MD   Clinical Note Signed, Intraprocedure Blocks edited, SmartForm saved

## 2023-05-28 NOTE — Anesthesia Procedure Notes (Addendum)
Anesthesia Regional Block: Adductor canal block   Pre-Anesthetic Checklist: , timeout performed,  Correct Patient, Correct Site, Correct Laterality,  Correct Procedure, Correct Position, site marked,  Risks and benefits discussed,  At surgeon's request and post-op pain management  Laterality: Right and Lower  Prep: chloraprep       Needles:  Injection technique: Single-shot  Needle Type: Echogenic Stimulator Needle     Needle Length: 10cm  Needle Gauge: 20   Needle insertion depth: 6 cm   Additional Needles:   Procedures:, nerve stimulator,,, ultrasound used (permanent image in chart),,     Nerve Stimulator or Paresthesia:  Response: Twitch elicited, 0.6 mA, 0.3 ms, 6 cm  Additional Responses:   Narrative:  Start time: 05/28/2023 7:16 AM End time: 05/28/2023 7:24 AM Injection made incrementally with aspirations every 5 mL.  Performed by: Personally  Anesthesiologist: Molli Barrows, MD  Additional Notes: BP cuff, EKG monitors applied. Sedation begun. After nerve location anesthetic injected incrementally, slowly , and after neg aspirations. Tolerated well.

## 2023-05-28 NOTE — Anesthesia Preprocedure Evaluation (Signed)
Anesthesia Evaluation  Patient identified by MRN, date of birth, ID band Patient awake    Reviewed: Allergy & Precautions, H&P , NPO status , Patient's Chart, lab work & pertinent test results  Airway Mallampati: II  TM Distance: >3 FB Neck ROM: Full    Dental  (+) Edentulous Upper, Edentulous Lower   Pulmonary COPD, former smoker   Pulmonary exam normal breath sounds clear to auscultation       Cardiovascular hypertension, Pt. on medications Normal cardiovascular exam Rhythm:Regular Rate:Normal  21-May-2023 15:49:27  [facility] ROUTINE RECORD [birth year] (76 yr) Female Caucasian Vent. rate 78 BPM PR interval 140 ms QRS duration 76 ms QT/QTcB 378/430 ms P-R-T axes 75 60 41 Normal sinus rhythm Normal ECG Confirmed by Steffanie Dunn [phone]) on 05/21/2023 10:08:21 PM   Neuro/Psych  Headaches  Neuromuscular disease (Back pain with right-sided radiculopathy)  negative psych ROS   GI/Hepatic Neg liver ROS,GERD  Medicated and Controlled,,  Endo/Other  Hypothyroidism    Renal/GU Renal disease (stones)  negative genitourinary   Musculoskeletal  (+) Arthritis , Osteoarthritis and Rheumatoid disorders,    Abdominal   Peds negative pediatric ROS (+)  Hematology  (+) Blood dyscrasia, anemia   Anesthesia Other Findings   Reproductive/Obstetrics negative OB ROS                             Anesthesia Physical Anesthesia Plan  ASA: 3  Anesthesia Plan: Spinal   Post-op Pain Management: Dilaudid IV and Regional block*   Induction: Intravenous  PONV Risk Score and Plan: 3 and Ondansetron, Dexamethasone and Treatment may vary due to age or medical condition  Airway Management Planned: Nasal Cannula, Natural Airway and Simple Face Mask  Additional Equipment:   Intra-op Plan:   Post-operative Plan:   Informed Consent: I have reviewed the patients History and Physical,  chart, labs and discussed the procedure including the risks, benefits and alternatives for the proposed anesthesia with the patient or authorized representative who has indicated his/her understanding and acceptance.     Dental advisory given  Plan Discussed with: CRNA and Surgeon  Anesthesia Plan Comments:        Anesthesia Quick Evaluation

## 2023-05-28 NOTE — Evaluation (Signed)
Physical Therapy Evaluation Patient Details Name: Bianca Mcguire MRN: [id number] DOB: 03/25/1946 Today's Date: 05/28/2023   RIGHT KNEE ROM:  0 - 90 degrees AMBULATION DISTANCE: 35 feet using RW with Min guard/Min assist   History of Present Illness  Bianca Mcguire is a 77 y/o female, s/p Right TKA on 05/28/23, with the diagnosis of osteoarthritis right knee.  Clinical Impression  Patient instructed in and given written instructions for HEP with good carryover, demonstrates labored movement for sitting up at bedside, able to achieve up to 90 degrees right knee while seated at EOB, fair return for right heel to toe stepping during gait training due to increased right knee pain.  Patient tolerated sitting up in chair with RLE dangling  with her granddaughter present after therapy - nurse aware.  Patient will benefit from continued skilled physical therapy in hospital and recommended venue below to increase strength, balance, endurance for safe ADLs and gait.         Recommendations for follow up therapy are one component of a multi-disciplinary discharge planning process, led by the attending physician.  Recommendations may be updated based on patient status, additional functional criteria and insurance authorization.  Follow Up Recommendations       Assistance Recommended at Discharge Set up Supervision/Assistance  Patient can return home with the following  A little help with walking and/or transfers;A little help with bathing/dressing/bathroom;Help with stairs or ramp for entrance;Assistance with cooking/housework    Equipment Recommendations None recommended by PT  Recommendations for Other Services       Functional Status Assessment Patient has had a recent decline in their functional status and demonstrates the ability to make significant improvements in function in a reasonable and predictable amount of time.     Precautions / Restrictions Precautions Precautions:  Fall Restrictions Weight Bearing Restrictions: Yes RLE Weight Bearing: Weight bearing as tolerated      Mobility  Bed Mobility Overal bed mobility: Needs Assistance Bed Mobility: Supine to Sit     Supine to sit: Min guard     General bed mobility comments: increased time, labored movement with fair/good return for propping up onto elbows to hands    Transfers Overall transfer level: Needs assistance Equipment used: Rolling walker (2 wheels) Transfers: Sit to/from Stand, Bed to chair/wheelchair/BSC Sit to Stand: Min guard   Step pivot transfers: Min guard       General transfer comment: unsteady labored movement    Ambulation/Gait Ambulation/Gait assistance: Min guard, Min assist Gait Distance (Feet): 35 Feet Assistive device: Rolling walker (2 wheels) Gait Pattern/deviations: Decreased step length - left, Decreased stance time - right, Decreased stride length, Antalgic Gait velocity: decreased     General Gait Details: slow labored cadende with fair carryover for right heel to toe stepping without loss of balance, limited mostly due to c/o fatigue and right knee pain  Stairs            Wheelchair Mobility    Modified Rankin (Stroke Patients Only)       Balance Overall balance assessment: Needs assistance Sitting-balance support: Feet supported, No upper extremity supported Sitting balance-Leahy Scale: Fair Sitting balance - Comments: fair/good seated at EOB   Standing balance support: During functional activity, No upper extremity supported Standing balance-Leahy Scale: Fair Standing balance comment: using RW                             Pertinent Vitals/Pain Pain Assessment  Pain Assessment: 0-10 Pain Score: 8  Pain Location: right knee Pain Descriptors / Indicators: Sore, Grimacing, Guarding Pain Intervention(s): Limited activity within patient's tolerance, Monitored during session, Repositioned    Home Living Family/patient  expects to be discharged to:: Private residence Living Arrangements: Alone Available Help at Discharge: Family;Available 24 hours/day Type of Home: Mobile home Home Access: Ramped entrance       Home Layout: One level Home Equipment: Agricultural consultant (2 wheels);BSC/3in1;Shower seat;Grab bars - tub/shower      Prior Function Prior Level of Function : Independent/Modified Independent;Driving             Mobility Comments: Community ambulator without AD, drives shop ADLs Comments: Independent     Hand Dominance   Dominant Hand: Right    Extremity/Trunk Assessment   Upper Extremity Assessment Upper Extremity Assessment: Overall WFL for tasks assessed    Lower Extremity Assessment Lower Extremity Assessment: RLE deficits/detail RLE Deficits / Details: grossly -4/5 RLE: Unable to fully assess due to pain RLE Sensation: WNL RLE Coordination: WNL    Cervical / Trunk Assessment Cervical / Trunk Assessment: Normal  Communication   Communication: No difficulties  Cognition Arousal/Alertness: Awake/alert Behavior During Therapy: WFL for tasks assessed/performed Overall Cognitive Status: Within Functional Limits for tasks assessed                                          General Comments      Exercises Total Joint Exercises Ankle Circles/Pumps: Supine, 10 reps, Right, Strengthening, AROM Quad Sets: AROM, Strengthening, Right, 10 reps, Supine Short Arc Quad: AROM, Strengthening, Right, 10 reps, Supine Heel Slides: AROM, Strengthening, Right, 10 reps, Supine Goniometric ROM: right knee: 0 - 90 degrees   Assessment/Plan    PT Assessment Patient needs continued PT services  PT Problem List Decreased strength;Decreased activity tolerance;Decreased range of motion;Decreased balance;Decreased mobility       PT Treatment Interventions DME instruction;Gait training;Stair training;Functional mobility training;Therapeutic activities;Therapeutic  exercise;Balance training;Patient/family education    PT Goals (Current goals can be found in the Care Plan section)  Acute Rehab PT Goals Patient Stated Goal: return home with family to assist PT Goal Formulation: With patient/family Time For Goal Achievement: 05/30/23 Potential to Achieve Goals: Good    Frequency BID     Co-evaluation               AM-PAC PT "6 Clicks" Mobility  Outcome Measure Help needed turning from your back to your side while in a flat bed without using bedrails?: None Help needed moving from lying on your back to sitting on the side of a flat bed without using bedrails?: A Little Help needed moving to and from a bed to a chair (including a wheelchair)?: A Little Help needed standing up from a chair using your arms (e.g., wheelchair or bedside chair)?: A Little Help needed to walk in hospital room?: A Little Help needed climbing 3-5 steps with a railing? : A Lot 6 Click Score: 18    End of Session   Activity Tolerance: Patient tolerated treatment well;Patient limited by fatigue Patient left: in chair;with call bell/phone within reach Nurse Communication: Mobility status PT Visit Diagnosis: Unsteadiness on feet (R26.81);Other abnormalities of gait and mobility (R26.89);Muscle weakness (generalized) (M62.81)    Time: 0981-1914 PT Time Calculation (min) (ACUTE ONLY): 25 min   Charges:   PT Evaluation $PT Eval Moderate Complexity: 1  Mod PT Treatments $Therapeutic Activity: 23-37 mins        4:04 PM, 05/28/23 Ocie Bob, MPT Physical Therapist with [facility] [phone] office [phone] mobile phone

## 2023-05-28 NOTE — Op Note (Signed)
Dictation for total knee replacement  05/28/2023  10:01 AM  Orthopaedic Surgery Operative Note (CSN: [id number])  [name]  [birth year] Date of Surgery: 05/28/2023   Diagnoses:  osteoarthritis RIGHT KNEE most likely rheumatoid  Procedure: RIGHT Total knee arthroplasty   Operative Finding OA LATERAL COMPARTMENT GR 4 POST HORN MENISCUS INTACT LATERAL DEFICIENT MEDIAL COMPATMENT GR 1 MENISCU INTACT  PTF JOINT GRADE 3 ACL PCL INTACT   Post-Op Diagnosis: Same Surgeons:Primary: Vickki Hearing, MD Assistants: Juanita Laster AND CYNTHIA WRENN Location: [facility] ROOM 4 Anesthesia: SPINAL PLUS PREOP BLOCK   Antibiotics: Ancef 2 g Tourniquet time:  Total Tourniquet Time Documented: Thigh (Right) - 92 minutes Total: Thigh (Right) - 92 minutes  Estimated Blood Loss: MINIMAL  Complications: None Specimens: None    Implants: Implant Name Type Inv. Item Serial No. Manufacturer Lot No. LRB No. Used Action  CEMENT HV SMART SET - [id number] Cement CEMENT HV SMART SET  [employer] [id number] Right 2 Implanted  INSERT TIB ATTUNE FB SZ4X5 - [id number] Insert INSERT TIB ATTUNE FB SZ4X5  DEPU[employer] ORTH[employer] [id number] Right 1 Implanted  PATELLA MEDIAL ATTUN KNEE - [id number] Knees PATELLA MEDIAL ATTUN KNEE  DEPU[employer]TH[employer] [id number] Right 1 Implanted  ATTUNE PS FEM RT SZ 4 CEM KNEE - [id number] Femur ATTUNE PS FEM RT SZ 4 CEM KNEE  [employer]PU[employer] [id number] Right 1 Implanted  BASEPLATE TIB CMT FB PCKT SZ4 - [id number] Stem BASEPLATE TIB CMT FB PCKT SZ4  [employer]PU[employer] [id number] Right 1 Implanted     Indications for Surgery:   Bianca Mcguire is a 77 y.o. female who presented for right total knee arthroplasty after failing nonoperative measures to control her knee pain  Benefits and risks of operative and nonoperative management were discussed prior to surgery with patient/guardian(s) and informed consent form was completed.  While all risks cannot be anticipated,  specific risks including infection, need for additional surgery, stiffness, postop pain, infection, implant removal, loosening, infection requiring amputation, deep vein thrombosis, pulmonary embolus were discussed.   Procedure:   The patient was identified properly. Informed consent was obtained and the surgical site was marked. The patient was taken to the OR where general anesthesia was induced.  The patient was positioned supine.  A Foley catheter was inserted.  The right knee was prepped and draped in the usual sterile fashion.  Timeout was performed before the beginning of the case.  Tourniquet was used for the above duration.   Details of surgery: The patient was identified by 2 approved identification mechanisms. The operative extremity was evaluated and found to be acceptable for surgical treatment today. The chart was reviewed. The surgical site was confirmed and marked. The patient had a saphenous nerve block   The patient was taken to the operating room and given appropriate antibiotic 2 g Ancef. This is consistent with the SCIP protocol.  The patient was given the following anesthetic: Spinal plus preop saphenous nerve block  Timeout was executed confirming the patient's name, surgical site, antibiotic administration, x-rays available, and implants available.  The operative limb,  was exsanguinated with a six-inch Esmarch and the tourniquet was inflated to 300 mmHg.  A straight midline incision was made over the right KNEE and taken down to the extensor mechanism. A medial arthrotomy was performed. The patella was everted and the patellofemoral soft tissue was released, along with the patellar fat pad.  The anterior cruciate ligament and PCL were resected.  The anterior horns of the lateral  and medial meniscus were resected. The medial soft tissue sleeve was elevated to the mid coronal plane.  A three-eighths inch drill bit was used to enter the femoral canal which was  decompressed with suction and irrigation until clear.   The distal femoral cutting guide was set for 9 mm distal resection,  4valgus alignment, for a right knee. The distal femur was resected and checked for flatness.  The distal shape of the femoral cut was indicating that the cup was not thick enough so 2 additional millimeters were taken  The attune sizing femoral guide was placed and the femur was preliminarily sized to a size 4.   The external alignment guide for the tibial resection was then applied to the distal and proximal tibia and set for anatomic slope along with 3 MM resection  from the lateral.   Rotational alignment was set using the malleolus, the tibial tubercle and the tibial spines.  The proximal tibia was resected along with  residual menisci. The tibia was sized using a base plate to a size 4.   The extension gap was checked.  It was tight therefore an additional 2 mm of proximal tibia was taken as the initial resection was conservative because we were dealing with a valgus knee  Resizing of the femur to a size 4 A 4-in-1 cutting block was placed along with collateral ligament retractors.  Our target was proper rotation based on the epicondylar axis using Whitesides line as a guide as well.  Once the block was pinned in place we took the spacer block that balanced extension gap and placed it on top of the tibia under the femoral block.  I tried with a 5 and a 6 and then decided to move the block down to decrease the flexion gap by 1.5 mm.  I pinned the block in place and then made the 4 cuts   The extension gap was rechecked with the same spacer block which was a size 5 with the block in place in the knee in extension I piecrust of the iliotibial band.  This balanced the extension gap and 200 rectangle   The correct sized notch cutting guide for the femur was then applied and the notch cut was made.  Trial reduction was completed using size 4 femur, 4 tibia, 5  polyethylene trial implants. Patella tracking was normal  We then skeletonized the patella. It measured 24 mm in thickness and the patellar resection was set for 9.5 mm resection; the patellar resection was completed. The patella diameter measured 35. We then drilled the peg holes for the patella.    The proximal tibia was prepared using the size 4 base plate.\  The patellar tendon insertion on the tibial tubercle was pinned in place to prevent peelback  Thorough irrigation was performed using saline  and the bone was dried and prepared for cement. The cement was mixed on the back table using third generation preparation techniques  The implants were then cemented in place and excess cement was removed. The cement was allowed to cure. Surgipor irrigation was placed followed by saline irrigation  Any excess bone fragments and cement was removed.  The extensor mechanism was closed with #1 Bralon suture and the rent in the lateral distal soft tissue sleeve was repaired with 1 Vicryl.  This was followed by subcutaneous tissue closure using 0 Monocryl suture in 2 layers  Zynrelef a total of 2 vials were injected prior to complete extensor mechanism closure.  The  openings and the capsule were then closed with #1 Braylon   Skin approximation was performed using staples  A sterile dressing was applied, TED hose were placed on the operative extremity followed by Cryo/Cuff.  The patient was taken recovery room in stable condition  Postop plan: Weightbearing as tolerated CPM machine Immediate physical therapy Discharge tomorrow if stable

## 2023-05-28 NOTE — Transfer of Care (Signed)
Immediate Anesthesia Transfer of Care Note  Patient: Bianca Mcguire  Procedure(s) Performed: TOTAL KNEE ARTHROPLASTY (Right: Knee)  Patient Location: PACU  Anesthesia Type:General and Spinal  Level of Consciousness: awake  Airway & Oxygen Therapy: Patient Spontanous Breathing  Post-op Assessment: Report given to RN  Post vital signs: Reviewed and stable  Last Vitals:  Vitals Value Taken Time  BP 113/53 05/28/23 1000  Temp    Pulse 64 05/28/23 1003  Resp 18 05/28/23 1003  SpO2 95 % 05/28/23 1003  Vitals shown include unvalidated device data.  Last Pain:  Vitals:   05/28/23 0647  PainSc: 0-No pain         Complications: No notable events documented.

## 2023-05-28 NOTE — Brief Op Note (Signed)
05/28/2023  10:00 AM  PATIENT:  Bianca Mcguire  77 y.o. female  PRE-OPERATIVE DIAGNOSIS:  osteoarthritis right knee  POST-OPERATIVE DIAGNOSIS:  osteoarthritis right knee  PROCEDURE:  Procedure(s): TOTAL KNEE ARTHROPLASTY (Right)  SURGEON:  Surgeon(s) and Role:    Vickki Hearing, MD - Primary  PHYSICIAN ASSISTANT:   ASSISTANTS: NIKKI HARLEY AND CYNTHIA WRENN   ANESTHESIA:   spinal and SAPHENOUS N BLOCK   EBL:  MINIMAL    BLOOD ADMINISTERED:none  DRAINS: none   LOCAL MEDICATIONS USED:  OTHER ZINRELEF  SPECIMEN:  No Specimen  DISPOSITION OF SPECIMEN:  N/A  COUNTS:  YES  TOURNIQUET:   Total Tourniquet Time Documented: Thigh (Right) - 92 minutes Total: Thigh (Right) - 92 minutes   DICTATION: .Reubin Milan Dictation  PLAN OF CARE: Admit for overnight observation  PATIENT DISPOSITION:  PACU - hemodynamically stable.   Delay start of Pharmacological VTE agent (>24hrs) due to surgical blood loss or risk of bleeding: yes

## 2023-05-28 NOTE — Anesthesia Procedure Notes (Signed)
Spinal  Patient location during procedure: OR Start time: 05/28/2023 7:46 AM Reason for block: surgical anesthesia Staffing Performed by: Moshe Salisbury, CRNA Authorized by: Molli Barrows, MD   Preanesthetic Checklist Completed: patient identified, IV checked, site marked, risks and benefits discussed, surgical consent, monitors and equipment checked, pre-op evaluation and timeout performed Spinal Block Patient position: sitting Prep: Betadine Patient monitoring: heart rate, cardiac monitor, continuous pulse ox and blood pressure Approach: midline Location: L3-4 Injection technique: single-shot Needle Needle type: Spinocan  Needle gauge: 22 G Needle length: 9 cm Assessment Sensory level: T10 Events: CSF return Additional Notes Lot # [id number] Exp 04/08/2025

## 2023-05-28 NOTE — Progress Notes (Signed)
Instructed on incentive spirometer. 750 ml obtained. Tolerated well. 

## 2023-05-28 NOTE — Anesthesia Postprocedure Evaluation (Addendum)
Anesthesia Post Note  Patient: Bianca Mcguire  Procedure(s) Performed: TOTAL KNEE ARTHROPLASTY (Right: Knee)  Patient location during evaluation: PACU Anesthesia Type: Spinal Level of consciousness: awake and alert and oriented Pain management: pain level controlled Vital Signs Assessment: post-procedure vital signs reviewed and stable Respiratory status: spontaneous breathing, nonlabored ventilation and respiratory function stable Cardiovascular status: blood pressure returned to baseline and stable Postop Assessment: no apparent nausea or vomiting, spinal receding and patient able to bend at knees Anesthetic complications: no  No notable events documented.   Last Vitals:  Vitals:   05/28/23 1245 05/28/23 1305  BP: 135/64 139/65  Pulse:  (!) 57  Resp: 12 14  Temp:  36.4 C  SpO2: 96% 100%    Last Pain:  Vitals:   05/28/23 1305  TempSrc: Oral  PainSc:                  Ermalinda Joubert C Karo Rog

## 2023-05-28 NOTE — Plan of Care (Signed)
  Problem: Acute Rehab PT Goals(only PT should resolve) Goal: Pt Will Go Supine/Side To Sit Outcome: Progressing Flowsheets (Taken 05/28/2023 1605) Pt will go Supine/Side to Sit: with modified independence Goal: Patient Will Transfer Sit To/From Stand Outcome: Progressing Flowsheets (Taken 05/28/2023 1605) Patient will transfer sit to/from stand: with modified independence Goal: Pt Will Transfer Bed To Chair/Chair To Bed Outcome: Progressing Flowsheets (Taken 05/28/2023 1605) Pt will Transfer Bed to Chair/Chair to Bed: with modified independence Goal: Pt Will Ambulate Outcome: Progressing Flowsheets (Taken 05/28/2023 1605) Pt will Ambulate:  75 feet  with supervision  with modified independence  with rolling walker   4:06 PM, 05/28/23 Ocie Bob, MPT Physical Therapist with [facility] [phone] office [phone] mobile phone

## 2023-05-28 NOTE — TOC Transition Note (Signed)
Transition of Care [facility]) - CM/SW Discharge Note   Patient Details  Name: Bianca Mcguire MRN: [id number] Date of Birth: 05/23/1946  Transition of Care [facility]) CM/SW Contact:  Villa Herb, LCSWA Phone Number: 05/28/2023, 4:10 PM   Clinical Narrative:    [facility] consulted for [facility] needs. CSW spoke to [place] with [facility] who states that [facility] services have been prearranged with their agency through Dr. Mort Sawyers office. TOC signing off.   Final next level of care: Home w Home Health Services Barriers to Discharge: Barriers Resolved   Patient Goals and CMS Choice      Discharge Placement                         Discharge Plan and Services Additional resources added to the After Visit Summary for                            [facility] Arranged: PT HH Agency: [facility] Date [facility] Agency Contacted: 05/28/23   Representative spoke with at [facility] Agency: Tresa Endo  Social Determinants of Health (SDOH) Interventions SDOH Screenings   Food Insecurity: No Food Insecurity (05/28/2023)  Housing: Low Risk  (05/28/2023)  Transportation Needs: No Transportation Needs (05/28/2023)  Utilities: Not At Risk (05/28/2023)  Alcohol Screen: Low Risk  (09/06/2022)  Depression (PHQ2-9): Low Risk  (04/19/2023)  Financial Resource Strain: Low Risk  (09/06/2022)  Physical Activity: Inactive (09/06/2022)  Social Connections: Moderately Isolated (09/06/2022)  Stress: No Stress Concern Present (09/06/2022)  Tobacco Use: Medium Risk (05/28/2023)     Readmission Risk Interventions     No data to display

## 2023-05-28 NOTE — Interval H&P Note (Signed)
History and Physical Interval Note:  05/28/2023 7:20 AM  Bianca Mcguire  has presented today for surgery, with the diagnosis of osteoarthritis right knee.  The various methods of treatment have been discussed with the patient and family. After consideration of risks, benefits and other options for treatment, the patient has consented to  Procedure(s): TOTAL KNEE ARTHROPLASTY (Right) as a surgical intervention.  The patient's history has been reviewed, patient examined, no change in status, stable for surgery.  I have reviewed the patient's chart and labs.  Questions were answered to the patient's satisfaction.     Fuller Canada

## 2023-05-29 DIAGNOSIS — J449 Chronic obstructive pulmonary disease, unspecified: Secondary | ICD-10-CM | POA: Diagnosis not present

## 2023-05-29 DIAGNOSIS — I10 Essential (primary) hypertension: Secondary | ICD-10-CM | POA: Diagnosis not present

## 2023-05-29 DIAGNOSIS — M1711 Unilateral primary osteoarthritis, right knee: Secondary | ICD-10-CM | POA: Diagnosis not present

## 2023-05-29 DIAGNOSIS — Z79899 Other long term (current) drug therapy: Secondary | ICD-10-CM | POA: Diagnosis not present

## 2023-05-29 DIAGNOSIS — Z87891 Personal history of nicotine dependence: Secondary | ICD-10-CM | POA: Diagnosis not present

## 2023-05-29 LAB — CBC
HCT: 29.5 % — ABNORMAL LOW (ref 36.0–46.0)
Hemoglobin: 8.4 g/dL — ABNORMAL LOW (ref 12.0–15.0)
MCH: 25.5 pg — ABNORMAL LOW (ref 26.0–34.0)
MCHC: 28.5 g/dL — ABNORMAL LOW (ref 30.0–36.0)
MCV: 89.7 fL (ref 80.0–100.0)
Platelets: 221 10*3/uL (ref 150–400)
RBC: 3.29 MIL/uL — ABNORMAL LOW (ref 3.87–5.11)
RDW: 17.3 % — ABNORMAL HIGH (ref 11.5–15.5)
WBC: 10 10*3/uL (ref 4.0–10.5)
nRBC: 0 % (ref 0.0–0.2)

## 2023-05-29 LAB — BASIC METABOLIC PANEL
Anion gap: 6 (ref 5–15)
BUN: 16 mg/dL (ref 8–23)
CO2: 21 mmol/L — ABNORMAL LOW (ref 22–32)
Calcium: 8.1 mg/dL — ABNORMAL LOW (ref 8.9–10.3)
Chloride: 109 mmol/L (ref 98–111)
Creatinine, Ser: 0.55 mg/dL (ref 0.44–1.00)
GFR, Estimated: >60 mL/min (ref >60)
Glucose, Bld: 142 mg/dL — ABNORMAL HIGH (ref 70–99)
Potassium: 5 mmol/L (ref 3.5–5.1)
Sodium: 136 mmol/L (ref 135–145)

## 2023-05-29 MED ORDER — ASPIRIN 81 MG PO CHEW: 81.0000 mg | CHEWABLE_TABLET | Freq: Two times a day (BID) | ORAL | 0 refills | Status: DC

## 2023-05-29 MED ORDER — POLYETHYLENE GLYCOL 3350 17 G PO PACK: 17.0000 g | PACK | Freq: Every day | ORAL | 0 refills | Status: DC | PRN

## 2023-05-29 MED ORDER — METHOCARBAMOL 500 MG PO TABS: 500.0000 mg | ORAL_TABLET | Freq: Four times a day (QID) | ORAL | 1 refills | Status: DC | PRN

## 2023-05-29 MED ORDER — TRAMADOL HCL 50 MG PO TABS: 50.0000 mg | ORAL_TABLET | Freq: Four times a day (QID) | ORAL | 0 refills | Status: DC

## 2023-05-29 MED ORDER — HYDROCODONE-ACETAMINOPHEN 5-325 MG PO TABS: 1.0000 | ORAL_TABLET | ORAL | 0 refills | Status: AC | PRN

## 2023-05-29 MED ORDER — DOCUSATE SODIUM 100 MG PO CAPS: 100.0000 mg | ORAL_CAPSULE | Freq: Two times a day (BID) | ORAL | 0 refills | Status: DC

## 2023-05-29 MED ORDER — CELECOXIB 200 MG PO CAPS: 200.0000 mg | ORAL_CAPSULE | Freq: Two times a day (BID) | ORAL | 1 refills | Status: DC

## 2023-05-29 NOTE — Progress Notes (Signed)
Physical Therapy Treatment Patient Details Name: Bianca Mcguire MRN: [id number] DOB: 08/15/1946 Today's Date: 05/29/2023   RIGHT KNEE ROM:  0 - 100 degrees AMBULATION DISTANCE: 100 feet using RW with Modified Independence   History of Present Illness Bianca Mcguire is a 77 y/o female, s/p Right TKA on 05/28/23, with the diagnosis of osteoarthritis right knee.    PT Comments    Patient demonstrates good return for going up/down 3 steps using bilateral siderails without loss of balance and understanding acknowledged, increased endurance/distance for gait training with fair carryover for right heel to toe stepping due to mild increase in pain and tolerated sitting up in chair with RLE dangling after therapy.  Patient will benefit from continued skilled physical therapy in hospital and recommended venue below to increase strength, balance, endurance for safe ADLs and gait.    Recommendations for follow up therapy are one component of a multi-disciplinary discharge planning process, led by the attending physician.  Recommendations may be updated based on patient status, additional functional criteria and insurance authorization.  Follow Up Recommendations       Assistance Recommended at Discharge Set up Supervision/Assistance  Patient can return home with the following A little help with walking and/or transfers;A little help with bathing/dressing/bathroom;Help with stairs or ramp for entrance;Assistance with cooking/housework   Equipment Recommendations  None recommended by PT    Recommendations for Other Services       Precautions / Restrictions Precautions Precautions: Fall Restrictions Weight Bearing Restrictions: Yes RLE Weight Bearing: Weight bearing as tolerated     Mobility  Bed Mobility Overal bed mobility: Modified Independent                  Transfers Overall transfer level: Needs assistance   Transfers: Sit to/from Stand, Bed to  chair/wheelchair/BSC Sit to Stand: Supervision, Min guard   Step pivot transfers: Supervision       General transfer comment: had diffiuclty completing sit to stand from bedside, good return for transferring from chair and wheelchair using armrest    Ambulation/Gait Ambulation/Gait assistance: Supervision, Modified independent (Device/Increase time) Gait Distance (Feet): 100 Feet   Gait Pattern/deviations: Decreased step length - left, Decreased stance time - right, Decreased stride length, Antalgic Gait velocity: decreased     General Gait Details: increased endurance/distance for gait training with fair return for right heel to toe stepping without loss of balance   Stairs Stairs: Yes Stairs assistance: Supervision, Min guard Stair Management: Two rails, Step to pattern Number of Stairs: 3 General stair comments: demonstrates good return for going up/down 3 steps using bilateral siderails without loss of balance and understanding acknowledged.   Wheelchair Mobility    Modified Rankin (Stroke Patients Only)       Balance Overall balance assessment: Needs assistance Sitting-balance support: Feet supported, No upper extremity supported Sitting balance-Leahy Scale: Good Sitting balance - Comments: seated at EOB   Standing balance support: During functional activity, Bilateral upper extremity supported Standing balance-Leahy Scale: Fair Standing balance comment: fair/good using RW                            Cognition Arousal/Alertness: Awake/alert Behavior During Therapy: WFL for tasks assessed/performed Overall Cognitive Status: Within Functional Limits for tasks assessed  Exercises Total Joint Exercises Ankle Circles/Pumps: Supine, 10 reps, Right, Strengthening, AROM Quad Sets: AROM, Strengthening, Right, 10 reps, Supine Short Arc Quad: AROM, Strengthening, Right, 10 reps, Supine Heel Slides:  AROM, Strengthening, Right, 10 reps, Supine Goniometric ROM: Right knee ROM: 0 - 100 degrees    General Comments        Pertinent Vitals/Pain Pain Assessment Pain Assessment: 0-10 Pain Score: 4  Pain Location: right knee Pain Descriptors / Indicators: Sore Pain Intervention(s): Limited activity within patient's tolerance, Monitored during session, Premedicated before session, Repositioned    Home Living                          Prior Function            PT Goals (current goals can now be found in the care plan section) Acute Rehab PT Goals Patient Stated Goal: return home with family to assist PT Goal Formulation: With patient/family Time For Goal Achievement: 05/30/23 Potential to Achieve Goals: Good Progress towards PT goals: Progressing toward goals    Frequency    BID      PT Plan Current plan remains appropriate    Co-evaluation              AM-PAC PT "6 Clicks" Mobility   Outcome Measure  Help needed turning from your back to your side while in a flat bed without using bedrails?: None Help needed moving from lying on your back to sitting on the side of a flat bed without using bedrails?: None Help needed moving to and from a bed to a chair (including a wheelchair)?: A Little Help needed standing up from a chair using your arms (e.g., wheelchair or bedside chair)?: A Little Help needed to walk in hospital room?: A Little Help needed climbing 3-5 steps with a railing? : A Little 6 Click Score: 20    End of Session   Activity Tolerance: Patient tolerated treatment well;Patient limited by fatigue Patient left: in chair;with call bell/phone within reach Nurse Communication: Mobility status PT Visit Diagnosis: Unsteadiness on feet (R26.81);Other abnormalities of gait and mobility (R26.89);Muscle weakness (generalized) (M62.81)     Time: 9604-5409 PT Time Calculation (min) (ACUTE ONLY): 28 min  Charges:  $Gait Training: 8-22  mins $Therapeutic Exercise: 8-22 mins                     10:27 AM, 05/29/23 Ocie Bob, MPT Physical Therapist with [facility] [phone] office [phone] mobile phone

## 2023-05-29 NOTE — Plan of Care (Signed)

## 2023-05-29 NOTE — Plan of Care (Signed)
Problem: Education: Goal: Knowledge of the prescribed therapeutic regimen will improve 05/29/2023 1151 by Sheela Stack, RN Outcome: Adequate for Discharge 05/29/2023 0954 by Sheela Stack, RN Outcome: Progressing Goal: Individualized Educational Video(s) 05/29/2023 1151 by Sheela Stack, RN Outcome: Adequate for Discharge 05/29/2023 0954 by Sheela Stack, RN Outcome: Progressing   Problem: Activity: Goal: Ability to avoid complications of mobility impairment will improve 05/29/2023 1151 by Sheela Stack, RN Outcome: Adequate for Discharge 05/29/2023 0954 by Sheela Stack, RN Outcome: Progressing Goal: Range of joint motion will improve 05/29/2023 1151 by Sheela Stack, RN Outcome: Adequate for Discharge 05/29/2023 0954 by Sheela Stack, RN Outcome: Progressing   Problem: Clinical Measurements: Goal: Postoperative complications will be avoided or minimized 05/29/2023 1151 by Sheela Stack, RN Outcome: Adequate for Discharge 05/29/2023 0954 by Sheela Stack, RN Outcome: Progressing   Problem: Pain Management: Goal: Pain level will decrease with appropriate interventions 05/29/2023 1151 by Sheela Stack, RN Outcome: Adequate for Discharge 05/29/2023 0954 by Sheela Stack, RN Outcome: Progressing   Problem: Skin Integrity: Goal: Will show signs of wound healing 05/29/2023 1151 by Sheela Stack, RN Outcome: Adequate for Discharge 05/29/2023 0954 by Sheela Stack, RN Outcome: Progressing   Problem: Education: Goal: Knowledge of General Education information will improve Description: Including pain rating scale, medication(s)/side effects and non-pharmacologic comfort measures 05/29/2023 1151 by Sheela Stack, RN Outcome: Adequate for Discharge 05/29/2023 0954 by Sheela Stack, RN Outcome: Progressing   Problem: Health Behavior/Discharge Planning: Goal: Ability to manage health-related needs will improve 05/29/2023 1151 by Sheela Stack,  RN Outcome: Adequate for Discharge 05/29/2023 0954 by Sheela Stack, RN Outcome: Progressing   Problem: Clinical Measurements: Goal: Ability to maintain clinical measurements within normal limits will improve 05/29/2023 1151 by Sheela Stack, RN Outcome: Adequate for Discharge 05/29/2023 0954 by Sheela Stack, RN Outcome: Progressing Goal: Will remain free from infection 05/29/2023 1151 by Sheela Stack, RN Outcome: Adequate for Discharge 05/29/2023 0954 by Sheela Stack, RN Outcome: Progressing Goal: Diagnostic test results will improve 05/29/2023 1151 by Sheela Stack, RN Outcome: Adequate for Discharge 05/29/2023 0954 by Sheela Stack, RN Outcome: Progressing Goal: Respiratory complications will improve 05/29/2023 1151 by Sheela Stack, RN Outcome: Adequate for Discharge 05/29/2023 0954 by Sheela Stack, RN Outcome: Progressing Goal: Cardiovascular complication will be avoided 05/29/2023 1151 by Sheela Stack, RN Outcome: Adequate for Discharge 05/29/2023 0954 by Sheela Stack, RN Outcome: Progressing   Problem: Activity: Goal: Risk for activity intolerance will decrease 05/29/2023 1151 by Sheela Stack, RN Outcome: Adequate for Discharge 05/29/2023 0954 by Sheela Stack, RN Outcome: Progressing   Problem: Nutrition: Goal: Adequate nutrition will be maintained 05/29/2023 1151 by Sheela Stack, RN Outcome: Adequate for Discharge 05/29/2023 0954 by Sheela Stack, RN Outcome: Progressing   Problem: Coping: Goal: Level of anxiety will decrease 05/29/2023 1151 by Sheela Stack, RN Outcome: Adequate for Discharge 05/29/2023 0954 by Sheela Stack, RN Outcome: Progressing   Problem: Elimination: Goal: Will not experience complications related to bowel motility 05/29/2023 1151 by Sheela Stack, RN Outcome: Adequate for Discharge 05/29/2023 0954 by Sheela Stack, RN Outcome: Progressing Goal: Will not experience complications related to urinary  retention 05/29/2023 1151 by Sheela Stack, RN Outcome: Adequate for Discharge 05/29/2023 0954 by Sheela Stack, RN Outcome: Progressing   Problem: Pain Managment: Goal: General experience of comfort will improve 05/29/2023 1151 by  Sheela Stack, RN Outcome: Adequate for Discharge 05/29/2023 1610 by Sheela Stack, RN Outcome: Progressing   Problem: Safety: Goal: Ability to remain free from injury will improve 05/29/2023 1151 by Sheela Stack, RN Outcome: Adequate for Discharge 05/29/2023 0954 by Sheela Stack, RN Outcome: Progressing   Problem: Skin Integrity: Goal: Risk for impaired skin integrity will decrease 05/29/2023 1151 by Sheela Stack, RN Outcome: Adequate for Discharge 05/29/2023 0954 by Sheela Stack, RN Outcome: Progressing

## 2023-05-29 NOTE — Therapy (Signed)
OUTPATIENT PHYSICAL THERAPY LOWER EXTREMITY EVALUATION   Patient Name: Bianca Mcguire MRN: [id number] DOB:August 07, 1946, 77 y.o., female Today's Date: 05/30/2023  END OF SESSION:  PT End of Session - 05/30/23 0908     Visit Number 1    Number of Visits 12    Date for PT Re-Evaluation 07/11/23    Authorization Type U[employer]; no vl    PT Start Time 0908    PT Stop Time 0945    PT Time Calculation (min) 37 min    Activity Tolerance Patient tolerated treatment well    Behavior During Therapy [facility] for tasks assessed/performed             Past Medical History:  Diagnosis Date   Acute pain of right knee 01/30/2011   Qualifier: Diagnosis of  By: Lodema Hong MD, Ameena     Anemia    Arthritis    Carpal tunnel syndrome, bilateral    COPD (chronic obstructive pulmonary disease) (HCC)    Diverticulosis dx 2011   GERD (gastroesophageal reflux disease)    Headache    Hypertension 06/02/2018   Hypothyroidism    Low back pain    Obesity    Rheumatoid arthritis(714.0)    Past Surgical History:  Procedure Laterality Date   bilateral foot surgery     COLONOSCOPY  2011   Dr. Darrick Penna: 3mm sessile polyp (tubular adenoma), pancolonic diverticulosis   COLONOSCOPY N/A 09/26/2017   diverticulosis, redundant colon   COLONOSCOPY WITH PROPOFOL N/A 05/01/2022   Procedure: COLONOSCOPY WITH PROPOFOL;  Surgeon: Lanelle Bal, DO;  Location: [facility];  Service: Endoscopy;  Laterality: N/A;  11:30am   ESOPHAGOGASTRODUODENOSCOPY N/A 09/16/2017   Dr. Darrick Penna: multiple gastric polyps, (fundic gland), mild gastritis, path with duodenal mucosa with focal villous blunting and surface erosions, moderate chronic gastritis with intestinal metaplasia and reactive changes. no H.pylori, dysplasia, or malignancy   FLEXIBLE SIGMOIDOSCOPY N/A 09/16/2017   Procedure: FLEXIBLE SIGMOIDOSCOPY;  Surgeon: West Bali, MD;  Location: [facility];  Service: Endoscopy;  Laterality: N/A;   HEMORRHOID SURGERY  1980    KNEE ARTHROSCOPY WITH LATERAL MENISECTOMY Right 10/16/2018   Procedure: KNEE ARTHROSCOPY WITH LATERAL MENISCECTOMY;  Surgeon: Vickki Hearing, MD;  Location: [facility];  Service: Orthopedics;  Laterality: Right;   OOPHORECTOMY Right    roophorectomy for benign disease  02/2010   Dr. Emelda Fear   tonsillectomy and adenoidctomy in childhood     TUBAL LIGATION  1977   Patient Active Problem List   Diagnosis Date Noted   Arthritis of knee, right 05/28/2023   Abnormal CT scan, chest 04/21/2023   Bilateral chronic knee pain 04/19/2023   Annual visit for general adult medical examination with abnormal findings 04/19/2023   Mixed simple and mucopurulent chronic bronchitis (HCC) 03/01/2023   Dysphagia 03/01/2023   Hoarseness of voice 03/01/2023   COPD with acute exacerbation (HCC) 01/30/2023   Positive colorectal cancer screening using Cologuard test 03/27/2022   Herpes zoster 03/15/2022   Reduced vision 10/26/2021   Nausea 09/04/2021   COPD (chronic obstructive pulmonary disease) (HCC) 04/12/2021   Knee pain, bilateral 05/10/2020   Allergic rhinitis 05/10/2020   S/P arthroscopy of right knee 10/16/18 10/23/2018   Meniscus, lateral, derangement, right    Primary osteoarthritis of right knee    Back spasm 08/11/2018   Essential hypertension 06/02/2018   Anemia    Normocytic anemia, not due to blood loss 07/26/2017   Vitamin B12 deficiency 07/26/2017   Shoulder pain, left 05/22/2017  Tubular adenoma of colon 07/17/2015   H/O nicotine dependence 07/17/2015   Unsteady gait 07/05/2014   Back pain with right-sided radiculopathy 06/07/2014   Osteoporosis 04/21/2014   IGT (impaired glucose tolerance) 09/20/2013   Adult hypothyroidism 02/02/2011   Hyperlipidemia 05/03/2010   SHOULDER PAIN, LEFT 05/03/2010   Vitamin D deficiency 01/10/2010   OSTEOARTHRITIS, KNEES, BILATERAL 01/10/2010   Fatigue 01/10/2010   Nephrolithiasis, left 01/10/2010   HAND PAIN, BILATERAL 11/17/2008   VAGINITIS,  ATROPHIC 10/05/2008   CARPAL TUNNEL SYNDROME, BILATERAL 09/07/2008   Rheumatoid arthritis (HCC) 09/07/2008   Low back pain with radiation 09/07/2008    PCP: Kerri Perches, MD  REFERRING PROVIDER: Vickki Hearing, MD  REFERRING DIAG: M17.11 (ICD-10-CM) - Unilateral primary osteoarthritis, right knee  THERAPY DIAG:  Acute pain of right knee  Difficulty in walking, not elsewhere classified  Stiffness of right knee, not elsewhere classified  Rationale for Evaluation and Treatment: Rehabilitation  ONSET DATE: 05/28/23  SUBJECTIVE:   SUBJECTIVE STATEMENT: History of right knee pain for years; has had arthroscopic surgery before; saw Romeo Apple and said she needed TKA; done 6/18 at [facility]; discharged next day  PERTINENT HISTORY: Needs left TKA  PAIN:  Are you having pain? Yes: NPRS scale: 6/10 Pain location: right knee Pain description: sore, throbbing Aggravating factors: getting in and out of the care Relieving factors: medication  PRECAUTIONS: None  WEIGHT BEARING RESTRICTIONS: No  FALLS:  Has patient fallen in last 6 months? No  LIVING ENVIRONMENT: Lives with: lives alone; granddaughter checks on her daily Lives in: House/apartment Stairs: No Has following equipment at home: Single point cane, shower chair, bed side commode, Ramped entry, and None  OCCUPATION: retired  PLOF: Independent  PATIENT GOALS: get back to doing everything  NEXT MD VISIT: 06/11/23  OBJECTIVE:   DIAGNOSTIC FINDINGS:    CLINICAL DATA:  Status post total arthroplasty   EXAM: PORTABLE RIGHT KNEE - 1-2 VIEW   COMPARISON:  04/26/2023   FINDINGS: Status post right knee total arthroplasty with expected overlying postoperative change. No perihardware fracture or component malpositioning.   IMPRESSION: Status post right knee total arthroplasty with expected overlying postoperative change. No perihardware fracture or component malpositioning.   PATIENT SURVEYS: FOTO  23  COGNITION: Overall cognitive status: Within functional limits for tasks assessed     SENSATION: Hands sometimes  EDEMA:  Right knee normal for this time s/p    POSTURE: flexed trunk   PALPATION: General soreness normal for this time s/p  LOWER EXTREMITY ROM:  Active ROM Right eval Left eval  Hip flexion    Hip extension    Hip abduction    Hip adduction    Hip internal rotation    Hip external rotation    Knee flexion 72 sitting   Knee extension -27 supine   Ankle dorsiflexion    Ankle plantarflexion    Ankle inversion    Ankle eversion     (Blank rows = not tested)  LOWER EXTREMITY MMT:  MMT Right eval Left eval  Hip flexion 3- 4  Hip extension    Hip abduction    Hip adduction    Hip internal rotation    Hip external rotation    Knee flexion    Knee extension 2- 4-  Ankle dorsiflexion 4 4+  Ankle plantarflexion    Ankle inversion    Ankle eversion     (Blank rows = not tested)  FUNCTIONAL TESTS:  5 times sit to stand: 29.96 using hands  to push up to standing 2 minute walk test: next visit  GAIT: Distance walked: 50 ft in clinic Assistive device utilized: Walker - 2 wheeled Level of assistance: SBA Comments: initially was using a standard bariatric walker; switched to RW in clinic and instructed on purchasing a smaller more appropriate walker for ambulation with her and her granddaughter; gave information on "[employer]"   TODAY'S TREATMENT:                                                                                                                              DATE: 05/30/23 physical therapy evaluation and HEP instruction    PATIENT EDUCATION:  Education details: Patient educated on exam findings, POC, scope of PT, HEP. Person educated: Patient Education method: Explanation, Demonstration, and Handouts Education comprehension: verbalized understanding, returned demonstration, verbal cues required, and tactile cues  required  HOME EXERCISE PROGRAM: Access Code: [email or web address] URL: [email or web address] Date: 05/30/2023 Prepared by: [facility]  Exercises - Supine Quad Set  - 3 x daily - 7 x weekly - 2 sets - 10 reps - 5" hold - Supine Heel Slide  - 3 x daily - 7 x weekly - 1 sets - 10 reps - Seated Heel Toe Raises  - 3 x daily - 7 x weekly - 1 sets - 10 reps - Seated Heel Slide  - 3 x daily - 7 x weekly - 1 sets - 10 reps - Seated March  - 3 x daily - 7 x weekly - 2 sets - 10 reps - Seated Long Arc Quad  - 3 x daily - 7 x weekly - 1 sets - 10 reps  ASSESSMENT:  CLINICAL IMPRESSION: Patient is a 77 y.o. female who was seen today for physical therapy evaluation and treatment for M17.11 (ICD-10-CM) - Unilateral primary osteoarthritis, right knee.  Patient demonstrates muscle weakness, reduced ROM, and fascial restrictions which are likely contributing to symptoms of pain and are negatively impacting patient ability to perform ADLs and functional mobility tasks. Patient will benefit from skilled physical therapy services to address these deficits to reduce pain and improve level of function with ADLs and functional mobility tasks.   OBJECTIVE IMPAIRMENTS: Abnormal gait, decreased activity tolerance, decreased balance, decreased endurance, decreased knowledge of use of DME, decreased mobility, difficulty walking, decreased ROM, decreased strength, hypomobility, increased edema, increased fascial restrictions, impaired perceived functional ability, impaired flexibility, postural dysfunction, and pain.   ACTIVITY LIMITATIONS: carrying, lifting, bending, sitting, standing, squatting, sleeping, stairs, transfers, bed mobility, locomotion level, and caring for others  PARTICIPATION LIMITATIONS: meal prep, cleaning, laundry, driving, shopping, community activity, and yard work  [facility] POTENTIAL: Good  CLINICAL DECISION MAKING: Stable/uncomplicated  EVALUATION COMPLEXITY: Low   GOALS: Goals  reviewed with patient? No  SHORT TERM GOALS: Target date: 06/20/2023 patient will be independent with initial HEP  Baseline: Goal status: INITIAL  2.  Patient will self report 30% improvement to improve tolerance for functional activity  Baseline:  Goal status: INITIAL  3.  Patient will improve right knee mobility to -10 to 90 degrees to promote normal knee mechanics with ambulation Baseline:  Goal status: INITIAL   LONG TERM GOALS: Target date: 07/11/2023  Patient will be independent in self management strategies to improve quality of life and functional outcomes.  Baseline:  Goal status: INITIAL  2.  Patient will self report 50% improvement to improve tolerance for functional activity  Baseline:  Goal status: INITIAL  3.  Patient will increase right  leg MMTs to 4+ to 5/5 without pain to promote return to ambulation community distances with minimal deviation.    Baseline: see above Goal status: INITIAL  4.  Patient will increased right knee mobility to -5 to 120 to promote normal navigation of steps; step over step pattern  Baseline: see above Goal status: INITIAL  5.  Patient will improve 5 times sit to stand score from 29.96 sec to 20 sec without use of arms to assist to demonstrate improved functional mobility and increased lower extremity strength.  Baseline:  Goal status: INITIAL  6.  Patient will improve FOTO score by 20 points   Baseline: 23 Goal status: INITIAL   PLAN:  PT FREQUENCY: 2x/week  PT DURATION: 6 weeks  PLANNED INTERVENTIONS: Therapeutic exercises, Therapeutic activity, Neuromuscular re-education, Balance training, Gait training, Patient/Family education, Joint manipulation, Joint mobilization, Stair training, Orthotic/Fit training, DME instructions, Aquatic Therapy, Dry Needling, Electrical stimulation, Spinal manipulation, Spinal mobilization, Cryotherapy, Moist heat, Compression bandaging, scar mobilization, Splintting, Taping,  Traction, Ultrasound, Ionotophoresis 4mg /ml Dexamethasone, and Manual therapy   PLAN FOR NEXT SESSION: Review HEP and goals; progress knee mobility and strength as able; gait and balance training;   9:52 AM, 05/30/23 Barrett Goldie Small Iley Breeden MPT [place] [facility] [place] [phone] Ph:[phone]

## 2023-05-30 ENCOUNTER — Telehealth: Payer: Self-pay | Admitting: Orthopedic Surgery

## 2023-05-30 ENCOUNTER — Encounter (HOSPITAL_COMMUNITY): Payer: Self-pay | Admitting: Orthopedic Surgery

## 2023-05-30 ENCOUNTER — Ambulatory Visit (HOSPITAL_COMMUNITY): Payer: 59 | Attending: Orthopedic Surgery

## 2023-05-30 ENCOUNTER — Other Ambulatory Visit: Payer: Self-pay

## 2023-05-30 DIAGNOSIS — M25661 Stiffness of right knee, not elsewhere classified: Secondary | ICD-10-CM | POA: Diagnosis not present

## 2023-05-30 DIAGNOSIS — M1711 Unilateral primary osteoarthritis, right knee: Secondary | ICD-10-CM | POA: Insufficient documentation

## 2023-05-30 DIAGNOSIS — R262 Difficulty in walking, not elsewhere classified: Secondary | ICD-10-CM | POA: Insufficient documentation

## 2023-05-30 DIAGNOSIS — M25561 Pain in right knee: Secondary | ICD-10-CM | POA: Diagnosis not present

## 2023-05-30 NOTE — Telephone Encounter (Signed)
Ethelda Chick with [facility] at [phone] called and states she has tried several times to call the patient and no one answers the phone.  She did do a drive by and knocked on the door several times and someone was in the house but they did not come to the door.    Please call her back at [phone]

## 2023-05-30 NOTE — Telephone Encounter (Signed)
I called stacy to cancel, she will continue outpatient.

## 2023-06-03 DIAGNOSIS — M1711 Unilateral primary osteoarthritis, right knee: Secondary | ICD-10-CM | POA: Diagnosis not present

## 2023-06-03 DIAGNOSIS — Z96651 Presence of right artificial knee joint: Secondary | ICD-10-CM | POA: Diagnosis not present

## 2023-06-03 NOTE — Discharge Summary (Signed)
Physician Discharge Summary  Patient ID: Bianca Mcguire MRN: [id number] DOB/AGE: 03/26/1946 77 y.o.  Admit date: 05/28/2023 Discharge date: 06/03/2023  Admission Diagnoses: Osteoarthritis of the knee  Discharge Diagnoses: Same  Discharged Condition: Stable  Procedure: Right total knee arthroplasty  Attune fixed-bearing posterior stabilized implant  Operative Finding OA LATERAL COMPARTMENT GR 4 POST HORN MENISCUS INTACT LATERAL DEFICIENT MEDIAL COMPATMENT GR 1 MENISCU INTACT  PTF JOINT GRADE 3 ACL PCL INTACT   Hospital Course:  Implant Name Type Inv. Item Serial No. Manufacturer Lot No. LRB No. Used Action  CEMENT HV SMART SET - [id number] Cement CEMENT HV SMART SET  [employer] [id number] Right 2 Implanted  INSERT TIB ATTUNE FB SZ4X5 - [id number] Insert INSERT TIB ATTUNE FB SZ4X5  [employer] [id number] Right 1 Implanted  PATELLA MEDIAL ATTUN KNEE - [id number] Knees PATELLA MEDIAL ATTUN KNEE  [employer] [id number] Right 1 Implanted  ATTUNE PS FEM RT SZ 4 CEM KNEE - [id number] Femur ATTUNE PS FEM RT SZ 4 CEM KNEE  [employer] [id number] Right 1 Implanted  BASEPLATE TIB CMT FB PCKT SZ4 - [id number] Stem BASEPLATE TIB CMT FB PCKT SZ4  [employer] [id number] Right 1 Implanted         Discharge Exam: BP (!) 158/66 (BP Location: Right Arm)   Pulse (!) 55   Temp 98.6 F (37 C)   Resp 18   Ht 5\' 2"  (1.575 m)   Wt 69.8 kg   SpO2 92%   BMI 28.15 kg/m  Physical Exam  Mental status awake alert and oriented x 3  Mood affect normal  Lower extremities no signs of DVT  Neurovascularly intact  Patient ambulatory with physical therapy including stairs     Latest Ref Rng & Units 05/29/2023    4:46 AM 05/21/2023    3:49 PM 03/01/2023   10:56 AM  CBC  WBC 4.0 - 10.5 K/uL 10.0  5.5  8.0   Hemoglobin 12.0 - 15.0 g/dL 8.4  70.6  23.7   Hematocrit 36.0 - 46.0 % 29.5  33.6  35.9   Platelets 150 - 400 K/uL 221  258  240       Latest Ref Rng  & Units 05/29/2023    4:46 AM 05/21/2023    3:49 PM 03/01/2023   10:56 AM  BMP  Glucose 70 - 99 mg/dL 628  315  87   BUN 8 - 23 mg/dL 16  28  18    Creatinine 0.44 - 1.00 mg/dL 1.76  1.60  7.37   BUN/Creat Ratio 12 - 28   29   Sodium 135 - 145 mmol/L 136  137  141   Potassium 3.5 - 5.1 mmol/L 5.0  3.8  4.2   Chloride 98 - 111 mmol/L 109  107  103   CO2 22 - 32 mmol/L 21  22  21    Calcium 8.9 - 10.3 mg/dL 8.1  8.7  9.2     Meds ordered this encounter  Medications   DISCONTD: lactated ringers infusion   OR Linked Order Group    chlorhexidine (PERIDEX) 0.12 % solution 15 mL    Oral care mouth rinse   DISCONTD: HYDROmorphone (DILAUDID) injection 0.25-0.5 mg   DISCONTD: ondansetron (ZOFRAN) injection 4 mg   midazolam (VERSED) injection 2 mg   DISCONTD: povidone-iodine 10 % swab 2 Application   ceFAZolin (ANCEF) IVPB 2g/100 mL premix    Order Specific Question:   Indication:    Answer:  Surgical Prophylaxis   tranexamic acid (CYKLOKAPRON) IVPB 1,000 mg   DISCONTD: 0.9 % irrigation (POUR BTL)   DISCONTD: sodium chloride irrigation 0.9 %   DISCONTD: bupivacaine-meloxicam ER (ZYNRELEF) injection   ondansetron (ZOFRAN) injection 4 mg   DISCONTD: oxyCODONE (Oxy IR/ROXICODONE) immediate release tablet 5 mg   DISCONTD: pregabalin (LYRICA) capsule 50 mg   DISCONTD: celecoxib (CELEBREX) capsule 200 mg   DISCONTD: amLODipine (NORVASC) tablet 5 mg   DISCONTD: benzonatate (TESSALON) capsule 200 mg   DISCONTD: Biotin TABS   DISCONTD: calcium-vitamin D (OSCAL WITH D) 500-5 MG-MCG per tablet   DISCONTD: cholecalciferol (VITAMIN D3) 25 MCG (1000 UNIT) tablet 2,000 Units   DISCONTD: fluticasone (FLONASE) 50 MCG/ACT nasal spray 2 spray   DISCONTD: folic acid (FOLVITE) tablet 1 mg   DISCONTD: gabapentin (NEURONTIN) capsule 100 mg   DISCONTD: ipratropium-albuterol (DUONEB) 0.5-2.5 (3) MG/3ML nebulizer solution 3 mL   DISCONTD: levothyroxine (SYNTHROID) tablet 75 mcg   DISCONTD: montelukast  (SINGULAIR) tablet 10 mg   DISCONTD: multivitamins with iron tablet 1 tablet   DISCONTD: pantoprazole (PROTONIX) EC tablet 40 mg   DISCONTD: vitamin B-12 (CYANOCOBALAMIN) tablet 500 mcg   DISCONTD: docusate sodium (COLACE) capsule 100 mg   DISCONTD: ondansetron (ZOFRAN) tablet 4 mg   DISCONTD: ondansetron (ZOFRAN) injection 4 mg   DISCONTD: metoCLOPramide (REGLAN) tablet 5-10 mg   DISCONTD: metoCLOPramide (REGLAN) injection 5-10 mg   DISCONTD: menthol-cetylpyridinium (CEPACOL) lozenge 3 mg   DISCONTD: phenol (CHLORASEPTIC) mouth spray 1 spray   DISCONTD: aspirin chewable tablet 81 mg   DISCONTD: 0.9 %  sodium chloride infusion   ceFAZolin (ANCEF) IVPB 2g/100 mL premix    Order Specific Question:   Indication:    Answer:   Surgical Prophylaxis   DISCONTD: acetaminophen (TYLENOL) tablet 325-650 mg   DISCONTD: HYDROcodone-acetaminophen (NORCO/VICODIN) 5-325 MG per tablet 1-2 tablet   DISCONTD: HYDROcodone-acetaminophen (NORCO) 7.5-325 MG per tablet 1-2 tablet   DISCONTD: morphine (PF) 2 MG/ML injection 0.5-1 mg   acetaminophen (TYLENOL) tablet 500 mg   DISCONTD: celecoxib (CELEBREX) capsule 200 mg   DISCONTD: traMADol (ULTRAM) tablet 50 mg   DISCONTD: methocarbamol (ROBAXIN) tablet 500 mg   DISCONTD: methocarbamol (ROBAXIN) 500 mg in dextrose 5 % 50 mL IVPB   DISCONTD: diphenhydrAMINE (BENADRYL) 12.5 MG/5ML elixir 12.5-25 mg   DISCONTD: polyethylene glycol (MIRALAX / GLYCOLAX) packet 17 g   dexamethasone (DECADRON) injection 10 mg   tranexamic acid (CYKLOKAPRON) IVPB 1,000 mg   aspirin 81 MG chewable tablet    Sig: Chew 1 tablet (81 mg total) by mouth 2 (two) times daily.    Dispense:  90 tablet    Refill:  0   celecoxib (CELEBREX) 200 MG capsule    Sig: Take 1 capsule (200 mg total) by mouth 2 (two) times daily.    Dispense:  60 capsule    Refill:  1   docusate sodium (COLACE) 100 MG capsule    Sig: Take 1 capsule (100 mg total) by mouth 2 (two) times daily.    Dispense:  10  capsule    Refill:  0   methocarbamol (ROBAXIN) 500 MG tablet    Sig: Take 1 tablet (500 mg total) by mouth every 6 (six) hours as needed for muscle spasms.    Dispense:  60 tablet    Refill:  1   polyethylene glycol (MIRALAX / GLYCOLAX) 17 g packet    Sig: Take 17 g by mouth daily as needed for mild constipation.    Dispense:  14  each    Refill:  0   traMADol (ULTRAM) 50 MG tablet    Sig: Take 1 tablet (50 mg total) by mouth every 6 (six) hours.    Dispense:  28 tablet    Refill:  0   HYDROcodone-acetaminophen (NORCO) 5-325 MG tablet    Sig: Take 1 tablet by mouth every 4 (four) hours as needed for up to 7 days for moderate pain.    Dispense:  42 tablet    Refill:  0     Disposition: Discharge disposition: 01-Home or Self Care       Discharge Instructions     Call MD / Call 911   Complete by: As directed    If you experience chest pain or shortness of breath, CALL 911 and be transported to the hospital emergency room.  If you develope a fever above 101 F, pus (white drainage) or increased drainage or redness at the wound, or calf pain, call your surgeon's office.   Change dressing   Complete by: As directed    Do not Change dressing   Constipation Prevention   Complete by: As directed    Drink plenty of fluids.  Prune juice may be helpful.  You may use a stool softener, such as Colace (over the counter) 100 mg twice a day.  Use MiraLax (over the counter) for constipation as needed.   Diet - low sodium heart healthy   Complete by: As directed    Discharge instructions   Complete by: As directed    Blue foam 30 min 3 x a day   Cpm ( if your insurance company approved) 6 hrs a day   Ice 30 min 6 x a day   Driving restrictions   Complete by: As directed    No driving for 4 weeks   Increase activity slowly as tolerated   Complete by: As directed    Post-operative opioid taper instructions:   Complete by: As directed    POST-OPERATIVE OPIOID TAPER INSTRUCTIONS: It  is important to wean off of your opioid medication as soon as possible. If you do not need pain medication after your surgery it is ok to stop day one. Opioids include: Codeine, Hydrocodone(Norco, Vicodin), Oxycodone(Percocet, oxycontin) and hydromorphone amongst others.  Long term and even short term use of opiods can cause: Increased pain response Dependence Constipation Depression Respiratory depression And more.  Withdrawal symptoms can include Flu like symptoms Nausea, vomiting And more Techniques to manage these symptoms Hydrate well Eat regular healthy meals Stay active Use relaxation techniques(deep breathing, meditating, yoga) Do Not substitute Alcohol to help with tapering If you have been on opioids for less than two weeks and do not have pain than it is ok to stop all together.  Plan to wean off of opioids This plan should start within one week post op of your joint replacement. Maintain the same interval or time between taking each dose and first decrease the dose.  Cut the total daily intake of opioids by one tablet each day Next start to increase the time between doses. The last dose that should be eliminated is the evening dose.         Allergies as of 05/29/2023   No Known Allergies      Medication List     STOP taking these medications    acetaminophen 500 MG tablet Commonly known as: TYLENOL       TAKE these medications    alendronate 70 MG tablet  Commonly known as: FOSAMAX TAKE 1 TABLET BY MOUTH WEEKLY  WITH 8 OZ OF PLAIN WATER 30  MINUTES BEFORE FIRST FOOD, DRINK OR MEDS. STAY UPRIGHT FOR 30  MINS   amLODipine 5 MG tablet Commonly known as: NORVASC TAKE 1 TABLET BY MOUTH DAILY   aspirin 81 MG chewable tablet Chew 1 tablet (81 mg total) by mouth 2 (two) times daily.   benzonatate 200 MG capsule Commonly known as: TESSALON Take 1 capsule (200 mg total) by mouth 2 (two) times daily as needed for cough.   BIOTIN PO Take 1 tablet by  mouth in the morning.   budesonide-formoterol 160-4.5 MCG/ACT inhaler Commonly known as: SYMBICORT Inhale 2 puffs into the lungs 2 (two) times daily.   CALCIUM 600 + D PO Take 1 tablet by mouth every evening.   celecoxib 200 MG capsule Commonly known as: CELEBREX Take 1 capsule (200 mg total) by mouth 2 (two) times daily.   cyanocobalamin 500 MCG tablet Commonly known as: VITAMIN B12 Take 500 mcg by mouth in the morning.   docusate sodium 100 MG capsule Commonly known as: COLACE Take 1 capsule (100 mg total) by mouth 2 (two) times daily.   fluticasone 50 MCG/ACT nasal spray Commonly known as: FLONASE SHAKE LIQUID AND USE 2 SPRAYS IN EACH NOSTRIL DAILY What changed: See the new instructions.   folic acid 800 MCG tablet Commonly known as: FOLVITE Take 800 mcg by mouth in the morning.   gabapentin 100 MG capsule Commonly known as: NEURONTIN TAKE 1 CAPSULE BY MOUTH AT  BEDTIME   HYDROcodone-acetaminophen 5-325 MG tablet Commonly known as: Norco Take 1 tablet by mouth every 4 (four) hours as needed for up to 7 days for moderate pain.   ipratropium-albuterol 0.5-2.5 (3) MG/3ML Soln Commonly known as: DUONEB Take 3 mLs by nebulization every 6 (six) hours as needed (wheezing/shortness of breath.).   levothyroxine 75 MCG tablet Commonly known as: SYNTHROID TAKE 1 TABLET BY MOUTH DAILY   meloxicam 7.5 MG tablet Commonly known as: MOBIC TAKE 1 TABLET BY MOUTH DAILY   methocarbamol 500 MG tablet Commonly known as: ROBAXIN Take 1 tablet (500 mg total) by mouth every 6 (six) hours as needed for muscle spasms.   montelukast 10 MG tablet Commonly known as: SINGULAIR TAKE 1 TABLET BY MOUTH AT  BEDTIME   multivitamins with iron Tabs tablet Take 1 tablet by mouth in the morning.   omeprazole 40 MG capsule Commonly known as: PRILOSEC TAKE 1 CAPSULE BY MOUTH TWICE  DAILY FOR 1 MONTH ONLY   polyethylene glycol 17 g packet Commonly known as: MIRALAX / GLYCOLAX Take 17 g by  mouth daily as needed for mild constipation.   traMADol 50 MG tablet Commonly known as: ULTRAM Take 1 tablet (50 mg total) by mouth every 6 (six) hours.   UNABLE TO FIND Nebulizer machine with tubing and supplies.   Vitamin D3 50 MCG (2000 UT) Tabs Take 2,000 Units by mouth in the morning.               Discharge Care Instructions  (From admission, onward)           Start     Ordered   05/29/23 0000  Change dressing       Comments: Do not Change dressing   05/29/23 1143            Follow-up Information     Vickki Hearing, MD Follow up on 06/11/2023.   Specialties: Orthopedic Surgery,  Radiology Why: For suture removal, For wound re-check Contact information: [address] [place] [address] [phone]                 Signed: Fuller Canada 06/03/2023, 8:02 AM

## 2023-06-07 DIAGNOSIS — Z96651 Presence of right artificial knee joint: Secondary | ICD-10-CM | POA: Insufficient documentation

## 2023-06-10 ENCOUNTER — Ambulatory Visit (INDEPENDENT_AMBULATORY_CARE_PROVIDER_SITE_OTHER): Payer: 59 | Admitting: Orthopedic Surgery

## 2023-06-10 DIAGNOSIS — Z96651 Presence of right artificial knee joint: Secondary | ICD-10-CM

## 2023-06-10 NOTE — Progress Notes (Signed)
Staples removed without complications wound looked good no redness or drainage noted.TRL

## 2023-06-10 NOTE — Progress Notes (Signed)
Chief Complaint  Patient presents with   Post-op Follow-up    Right TKA DOS 05/28/23    Encounter Diagnosis  Name Primary?   S/P total knee replacement, right 05/28/23 Yes    Postop visit #1 postop day 13 status post right total knee arthroplasty on June 18  The patient insurance required her to go to outpatient therapy which she has started  She is complaining of no pain at this time her wound looks good.  It looks like she has a little bit of extension deficit  Recommend continue physical therapy she did not require any new prescriptions today  Follow-up 3 to 4 weeks check range of motion at that time

## 2023-06-12 ENCOUNTER — Ambulatory Visit (HOSPITAL_COMMUNITY): Payer: 59 | Attending: Orthopedic Surgery | Admitting: Physical Therapy

## 2023-06-12 DIAGNOSIS — M25661 Stiffness of right knee, not elsewhere classified: Secondary | ICD-10-CM | POA: Diagnosis not present

## 2023-06-12 DIAGNOSIS — R262 Difficulty in walking, not elsewhere classified: Secondary | ICD-10-CM | POA: Diagnosis not present

## 2023-06-12 DIAGNOSIS — M25561 Pain in right knee: Secondary | ICD-10-CM | POA: Insufficient documentation

## 2023-06-12 NOTE — Therapy (Signed)
OUTPATIENT PHYSICAL THERAPY LOWER EXTREMITY EVALUATION   Patient Name: Bianca Mcguire MRN: [id number] DOB:1946/02/18, 77 y.o., female Today's Date: 06/12/2023  END OF SESSION:  PT End of Session - 06/12/23 1050    Visit Number 2    Number of Visits 12    Date for PT Re-Evaluation 07/11/23    Authorization Type [employer]; no vl    PT Start Time 1010    PT Stop Time 1050    PT Time Calculation (min) 40 min    Activity Tolerance Patient tolerated treatment well    Behavior During Therapy [facility] for tasks assessed/performed             Past Medical History:  Diagnosis Date   Acute pain of right knee 01/30/2011   Qualifier: Diagnosis of  By: Lodema Hong MD, Loys     Anemia    Arthritis    Carpal tunnel syndrome, bilateral    COPD (chronic obstructive pulmonary disease) (HCC)    Diverticulosis dx 2011   GERD (gastroesophageal reflux disease)    Headache    Hypertension 06/02/2018   Hypothyroidism    Low back pain    Obesity    Rheumatoid arthritis(714.0)    Past Surgical History:  Procedure Laterality Date   bilateral foot surgery     COLONOSCOPY  2011   Dr. Darrick Penna: 3mm sessile polyp (tubular adenoma), pancolonic diverticulosis   COLONOSCOPY N/A 09/26/2017   diverticulosis, redundant colon   COLONOSCOPY WITH PROPOFOL N/A 05/01/2022   Procedure: COLONOSCOPY WITH PROPOFOL;  Surgeon: Lanelle Bal, DO;  Location: [facility];  Service: Endoscopy;  Laterality: N/A;  11:30am   ESOPHAGOGASTRODUODENOSCOPY N/A 09/16/2017   Dr. Darrick Penna: multiple gastric polyps, (fundic gland), mild gastritis, path with duodenal mucosa with focal villous blunting and surface erosions, moderate chronic gastritis with intestinal metaplasia and reactive changes. no H.pylori, dysplasia, or malignancy   FLEXIBLE SIGMOIDOSCOPY N/A 09/16/2017   Procedure: FLEXIBLE SIGMOIDOSCOPY;  Surgeon: West Bali, MD;  Location: [facility];  Service: Endoscopy;  Laterality: N/A;   HEMORRHOID SURGERY  1980    KNEE ARTHROSCOPY WITH LATERAL MENISECTOMY Right 10/16/2018   Procedure: KNEE ARTHROSCOPY WITH LATERAL MENISCECTOMY;  Surgeon: Vickki Hearing, MD;  Location: [facility];  Service: Orthopedics;  Laterality: Right;   OOPHORECTOMY Right    roophorectomy for benign disease  02/2010   Dr. Emelda Fear   tonsillectomy and adenoidctomy in childhood     TOTAL KNEE ARTHROPLASTY Right 05/28/2023   Procedure: TOTAL KNEE ARTHROPLASTY;  Surgeon: Vickki Hearing, MD;  Location: [facility];  Service: Orthopedics;  Laterality: Right;   TUBAL LIGATION  1977   Patient Active Problem List   Diagnosis Date Noted   S/P total knee replacement, right 05/28/23 06/07/2023   Arthritis of knee, right 05/28/2023   Abnormal CT scan, chest 04/21/2023   Bilateral chronic knee pain 04/19/2023   Annual visit for general adult medical examination with abnormal findings 04/19/2023   Mixed simple and mucopurulent chronic bronchitis (HCC) 03/01/2023   Dysphagia 03/01/2023   Hoarseness of voice 03/01/2023   COPD with acute exacerbation (HCC) 01/30/2023   Positive colorectal cancer screening using Cologuard test 03/27/2022   Herpes zoster 03/15/2022   Reduced vision 10/26/2021   Nausea 09/04/2021   COPD (chronic obstructive pulmonary disease) (HCC) 04/12/2021   Knee pain, bilateral 05/10/2020   Allergic rhinitis 05/10/2020   S/P arthroscopy of right knee 10/16/18 10/23/2018   Meniscus, lateral, derangement, right    Primary osteoarthritis of right knee  Back spasm 08/11/2018   Essential hypertension 06/02/2018   Anemia    Normocytic anemia, not due to blood loss 07/26/2017   Vitamin B12 deficiency 07/26/2017   Shoulder pain, left 05/22/2017   Tubular adenoma of colon 07/17/2015   H/O nicotine dependence 07/17/2015   Unsteady gait 07/05/2014   Back pain with right-sided radiculopathy 06/07/2014   Osteoporosis 04/21/2014   IGT (impaired glucose tolerance) 09/20/2013   Adult hypothyroidism 02/02/2011   Hyperlipidemia  05/03/2010   SHOULDER PAIN, LEFT 05/03/2010   Vitamin D deficiency 01/10/2010   OSTEOARTHRITIS, KNEES, BILATERAL 01/10/2010   Fatigue 01/10/2010   Nephrolithiasis, left 01/10/2010   HAND PAIN, BILATERAL 11/17/2008   VAGINITIS, ATROPHIC 10/05/2008   CARPAL TUNNEL SYNDROME, BILATERAL 09/07/2008   Rheumatoid arthritis (HCC) 09/07/2008   Low back pain with radiation 09/07/2008    PCP: Kerri Perches, MD  REFERRING PROVIDER: Vickki Hearing, MD  REFERRING DIAG: M17.11 (ICD-10-CM) - Unilateral primary osteoarthritis, right knee  THERAPY DIAG:  Acute pain of right knee  Stiffness of right knee, not elsewhere classified  Rationale for Evaluation and Treatment: Rehabilitation  ONSET DATE: 05/28/23  SUBJECTIVE:   SUBJECTIVE STATEMENT: PT states that she is sore.  She has been completing her exercises.   PERTINENT HISTORY: Needs left TKA  PAIN:  Are you having pain? Yes: NPRS scale: 8/10 Pain location: right knee Pain description: sore, throbbing Aggravating factors: getting in and out of the care Relieving factors: medication  PRECAUTIONS: None  WEIGHT BEARING RESTRICTIONS: No  FALLS:  Has patient fallen in last 6 months? No  LIVING ENVIRONMENT: Lives with: lives alone; granddaughter checks on her daily Lives in: House/apartment Stairs: No Has following equipment at home: Single point cane, shower chair, bed side commode, Ramped entry, and None  OCCUPATION: retired  PLOF: Independent  PATIENT GOALS: get back to doing everything  NEXT MD VISIT: 06/11/23  OBJECTIVE:   DIAGNOSTIC FINDINGS:    CLINICAL DATA:  Status post total arthroplasty   EXAM: PORTABLE RIGHT KNEE - 1-2 VIEW   COMPARISON:  04/26/2023   FINDINGS: Status post right knee total arthroplasty with expected overlying postoperative change. No perihardware fracture or component malpositioning.   IMPRESSION: Status post right knee total arthroplasty with expected  overlying postoperative change. No perihardware fracture or component malpositioning.   PATIENT SURVEYS: FOTO 23  COGNITION: Overall cognitive status: Within functional limits for tasks assessed     SENSATION: Hands sometimes  EDEMA:  Right knee normal for this time s/p    POSTURE: flexed trunk   PALPATION: General soreness normal for this time s/p  LOWER EXTREMITY ROM:  Active ROM Right eval Right 6/20  Hip flexion    Hip extension    Hip abduction    Hip adduction    Hip internal rotation    Hip external rotation    Knee flexion 72 sitting Supine : 90  Knee extension -27 supine -15  Ankle dorsiflexion    Ankle plantarflexion    Ankle inversion    Ankle eversion     (Blank rows = not tested)  LOWER EXTREMITY MMT:  MMT Right eval Left eval  Hip flexion 3- 4  Hip extension    Hip abduction    Hip adduction    Hip internal rotation    Hip external rotation    Knee flexion    Knee extension 2- 4-  Ankle dorsiflexion 4 4+  Ankle plantarflexion    Ankle inversion    Ankle eversion     (  Blank rows = not tested)  FUNCTIONAL TESTS:  5 times sit to stand: 29.96 using hands to push up to standing 2 minute walk test: next visit  GAIT: Distance walked: 50 ft in clinic Assistive device utilized: Walker - 2 wheeled Level of assistance: SBA Comments: initially was using a standard bariatric walker; switched to RW in clinic and instructed on purchasing a smaller more appropriate walker for ambulation with her and her granddaughter; gave information on "[employer]"   TODAY'S TREATMENT:                                                                                                                              DATE:  06/12/2023 Gt with RW x 25ft:  Pt states that she is fatigued.  Urged pt to walk every 2 hours for at least 2-5 minutes  Sitting:  LAQ going into flexion as far as possible x 10  Supine: quad set x 10                Heel slide x 10                SLR x 5 due to c/o increased back pain               SAQ x 10                Bridge x 5  Standing heel raises x 10 Manual to decrease pain and edema Patella mob to improve ROM                  05/30/23 physical therapy evaluation and HEP instruction    PATIENT EDUCATION:  Education details: Patient educated on exam findings, POC, scope of PT, HEP. Person educated: Patient Education method: Explanation, Demonstration, and Handouts Education comprehension: verbalized understanding, returned demonstration, verbal cues required, and tactile cues required  HOME EXERCISE PROGRAM: Access Code: [email or web address] URL: [email or web address] Date: 05/30/2023 Prepared by: [facility]  Exercises - Supine Quad Set  - 3 x daily - 7 x weekly - 2 sets - 10 reps - 5" hold - Supine Heel Slide  - 3 x daily - 7 x weekly - 1 sets - 10 reps - Seated Heel Toe Raises  - 3 x daily - 7 x weekly - 1 sets - 10 reps - Seated Heel Slide  - 3 x daily - 7 x weekly - 1 sets - 10 reps - Seated March  - 3 x daily - 7 x weekly - 2 sets - 10 reps - Seated Long Arc Quad  - 3 x daily - 7 x weekly - 1 sets - 10 reps  06/12/23  Supine Knee Extension Strengthening  - 2 x daily - 7 x weekly - 1 sets - 10 reps - 3-5" hold - Sit to Stand Without Arm Support  - 2 x daily - 7 x weekly - 1 sets - 10 reps -  Heel Raises with Counter Support  - 2 x daily - 7 x weekly - 1 sets - 10 reps - 3-5 " hold   CLINICAL IMPRESSION: PT evaluation and goals reviewed with pt.  Patient  continues to demonstrates muscle weakness, reduced ROM, and fascial restrictions which are likely contributing to symptoms of pain and are negatively impacting patient ability to perform ADLs and functional mobility tasks. Patient will benefit from skilled physical therapy services to address these deficits to reduce pain and improve level of function with ADLs and functional mobility tasks.   OBJECTIVE IMPAIRMENTS: Abnormal gait, decreased activity  tolerance, decreased balance, decreased endurance, decreased knowledge of use of DME, decreased mobility, difficulty walking, decreased ROM, decreased strength, hypomobility, increased edema, increased fascial restrictions, impaired perceived functional ability, impaired flexibility, postural dysfunction, and pain.   ACTIVITY LIMITATIONS: carrying, lifting, bending, sitting, standing, squatting, sleeping, stairs, transfers, bed mobility, locomotion level, and caring for others  PARTICIPATION LIMITATIONS: meal prep, cleaning, laundry, driving, shopping, community activity, and yard work  [facility] POTENTIAL: Good  CLINICAL DECISION MAKING: Stable/uncomplicated  EVALUATION COMPLEXITY: Low   GOALS: Goals reviewed with patient? No  SHORT TERM GOALS: Target date: 06/20/2023 patient will be independent with initial HEP  Baseline: Goal status: IN PROGRESS  2.  Patient will self report 30% improvement to improve tolerance for functional activity  Baseline:  Goal status: IN PROGRESS  3.  Patient will improve right knee mobility to -10 to 90 degrees to promote normal knee mechanics with ambulation Baseline:  Goal status: IN PROGRESS   LONG TERM GOALS: Target date: 07/11/2023  Patient will be independent in self management strategies to improve quality of life and functional outcomes.  Baseline:  Goal status: IN PROGRESS  2.  Patient will self report 50% improvement to improve tolerance for functional activity  Baseline:  Goal status: IN PROGRESS  3.  Patient will increase right  leg MMTs to 4+ to 5/5 without pain to promote return to ambulation community distances with minimal deviation.    Baseline: see above Goal status: IN PROGRESS  4.  Patient will increased right knee mobility to -5 to 120 to promote normal navigation of steps; step over step pattern  Baseline: see above Goal status: IN PROGRESS  5.  Patient will improve 5 times sit to stand score from 29.96 sec to 20  sec without use of arms to assist to demonstrate improved functional mobility and increased lower extremity strength.  Baseline:  Goal status: IN PROGRESS  6.  Patient will improve FOTO score by 20 points   Baseline: 23 Goal status: IN PROGRESS   PLAN:  PT FREQUENCY: 2x/week  PT DURATION: 6 weeks  PLANNED INTERVENTIONS: Therapeutic exercises, Therapeutic activity, Neuromuscular re-education, Balance training, Gait training, Patient/Family education, Joint manipulation, Joint mobilization, Stair training, Orthotic/Fit training, DME instructions, Aquatic Therapy, Dry Needling, Electrical stimulation, Spinal manipulation, Spinal mobilization, Cryotherapy, Moist heat, Compression bandaging, scar mobilization, Splintting, Taping, Traction, Ultrasound, Ionotophoresis 4mg /ml Dexamethasone, and Manual therapy   PLAN FOR NEXT SESSION: Review HEP and goals; progress knee mobility and strength as able; gait and balance training;  Virgina Organ, PT CLT [phone]

## 2023-06-18 ENCOUNTER — Ambulatory Visit (HOSPITAL_COMMUNITY): Payer: 59 | Admitting: Physical Therapy

## 2023-06-18 DIAGNOSIS — R262 Difficulty in walking, not elsewhere classified: Secondary | ICD-10-CM | POA: Diagnosis not present

## 2023-06-18 DIAGNOSIS — M25661 Stiffness of right knee, not elsewhere classified: Secondary | ICD-10-CM | POA: Diagnosis not present

## 2023-06-18 DIAGNOSIS — M25561 Pain in right knee: Secondary | ICD-10-CM | POA: Diagnosis not present

## 2023-06-18 NOTE — Therapy (Signed)
OUTPATIENT PHYSICAL THERAPY LOWER EXTREMITY EVALUATION   Patient Name: Bianca Mcguire MRN: [id number] DOB:04-Mar-1946, 77 y.o., female Today's Date: 06/18/2023  END OF SESSION:  PT End of Session - 06/18/23 0855    Visit Number 3    Number of Visits 12    Date for PT Re-Evaluation 07/11/23    Authorization Type [employer]; no vl    PT Start Time 0815    PT Stop Time 0855    PT Time Calculation (min) 40 min    Activity Tolerance Patient tolerated treatment well    Behavior During Therapy [facility] for tasks assessed/performed             Past Medical History:  Diagnosis Date   Acute pain of right knee 01/30/2011   Qualifier: Diagnosis of  By: Lodema Hong MD, Addisyn     Anemia    Arthritis    Carpal tunnel syndrome, bilateral    COPD (chronic obstructive pulmonary disease) (HCC)    Diverticulosis dx 2011   GERD (gastroesophageal reflux disease)    Headache    Hypertension 06/02/2018   Hypothyroidism    Low back pain    Obesity    Rheumatoid arthritis(714.0)    Past Surgical History:  Procedure Laterality Date   bilateral foot surgery     COLONOSCOPY  2011   Dr. Darrick Penna: 3mm sessile polyp (tubular adenoma), pancolonic diverticulosis   COLONOSCOPY N/A 09/26/2017   diverticulosis, redundant colon   COLONOSCOPY WITH PROPOFOL N/A 05/01/2022   Procedure: COLONOSCOPY WITH PROPOFOL;  Surgeon: Lanelle Bal, DO;  Location: [facility];  Service: Endoscopy;  Laterality: N/A;  11:30am   ESOPHAGOGASTRODUODENOSCOPY N/A 09/16/2017   Dr. Darrick Penna: multiple gastric polyps, (fundic gland), mild gastritis, path with duodenal mucosa with focal villous blunting and surface erosions, moderate chronic gastritis with intestinal metaplasia and reactive changes. no H.pylori, dysplasia, or malignancy   FLEXIBLE SIGMOIDOSCOPY N/A 09/16/2017   Procedure: FLEXIBLE SIGMOIDOSCOPY;  Surgeon: West Bali, MD;  Location: [facility];  Service: Endoscopy;  Laterality: N/A;   HEMORRHOID SURGERY  1980    KNEE ARTHROSCOPY WITH LATERAL MENISECTOMY Right 10/16/2018   Procedure: KNEE ARTHROSCOPY WITH LATERAL MENISCECTOMY;  Surgeon: Vickki Hearing, MD;  Location: [facility];  Service: Orthopedics;  Laterality: Right;   OOPHORECTOMY Right    roophorectomy for benign disease  02/2010   Dr. Emelda Fear   tonsillectomy and adenoidctomy in childhood     TOTAL KNEE ARTHROPLASTY Right 05/28/2023   Procedure: TOTAL KNEE ARTHROPLASTY;  Surgeon: Vickki Hearing, MD;  Location: [facility];  Service: Orthopedics;  Laterality: Right;   TUBAL LIGATION  1977   Patient Active Problem List   Diagnosis Date Noted   S/P total knee replacement, right 05/28/23 06/07/2023   Arthritis of knee, right 05/28/2023   Abnormal CT scan, chest 04/21/2023   Bilateral chronic knee pain 04/19/2023   Annual visit for general adult medical examination with abnormal findings 04/19/2023   Mixed simple and mucopurulent chronic bronchitis (HCC) 03/01/2023   Dysphagia 03/01/2023   Hoarseness of voice 03/01/2023   COPD with acute exacerbation (HCC) 01/30/2023   Positive colorectal cancer screening using Cologuard test 03/27/2022   Herpes zoster 03/15/2022   Reduced vision 10/26/2021   Nausea 09/04/2021   COPD (chronic obstructive pulmonary disease) (HCC) 04/12/2021   Knee pain, bilateral 05/10/2020   Allergic rhinitis 05/10/2020   S/P arthroscopy of right knee 10/16/18 10/23/2018   Meniscus, lateral, derangement, right    Primary osteoarthritis of right knee  Back spasm 08/11/2018   Essential hypertension 06/02/2018   Anemia    Normocytic anemia, not due to blood loss 07/26/2017   Vitamin B12 deficiency 07/26/2017   Shoulder pain, left 05/22/2017   Tubular adenoma of colon 07/17/2015   H/O nicotine dependence 07/17/2015   Unsteady gait 07/05/2014   Back pain with right-sided radiculopathy 06/07/2014   Osteoporosis 04/21/2014   IGT (impaired glucose tolerance) 09/20/2013   Adult hypothyroidism 02/02/2011   Hyperlipidemia  05/03/2010   SHOULDER PAIN, LEFT 05/03/2010   Vitamin D deficiency 01/10/2010   OSTEOARTHRITIS, KNEES, BILATERAL 01/10/2010   Fatigue 01/10/2010   Nephrolithiasis, left 01/10/2010   HAND PAIN, BILATERAL 11/17/2008   VAGINITIS, ATROPHIC 10/05/2008   CARPAL TUNNEL SYNDROME, BILATERAL 09/07/2008   Rheumatoid arthritis (HCC) 09/07/2008   Low back pain with radiation 09/07/2008    PCP: Kerri Perches, MD  REFERRING PROVIDER: Vickki Hearing, MD  REFERRING DIAG: M17.11 (ICD-10-CM) - Unilateral primary osteoarthritis, right knee  THERAPY DIAG:  Acute pain of right knee  Stiffness of right knee, not elsewhere classified  Difficulty in walking, not elsewhere classified  Rationale for Evaluation and Treatment: Rehabilitation  ONSET DATE: 05/28/23  SUBJECTIVE:   SUBJECTIVE STATEMENT: PT states that her pain has come down quite a bit.  She is doing her exercises without difficulty.    PERTINENT HISTORY: Needs left TKA  PAIN:  Are you having pain? Yes: NPRS scale: 4/10 Pain location: right knee Pain description: sore, throbbing Aggravating factors: getting in and out of the care Relieving factors: medication  PRECAUTIONS: None  WEIGHT BEARING RESTRICTIONS: No  FALLS:  Has patient fallen in last 6 months? No  LIVING ENVIRONMENT: Lives with: lives alone; granddaughter checks on her daily Lives in: House/apartment Stairs: No Has following equipment at home: Single point cane, shower chair, bed side commode, Ramped entry, and None  OCCUPATION: retired  PLOF: Independent  PATIENT GOALS: get back to doing everything  NEXT MD VISIT: 06/11/23  OBJECTIVE:   DIAGNOSTIC FINDINGS:    CLINICAL DATA:  Status post total arthroplasty   EXAM: PORTABLE RIGHT KNEE - 1-2 VIEW   COMPARISON:  04/26/2023   FINDINGS: Status post right knee total arthroplasty with expected overlying postoperative change. No perihardware fracture or component malpositioning.    IMPRESSION: Status post right knee total arthroplasty with expected overlying postoperative change. No perihardware fracture or component malpositioning.   PATIENT SURVEYS: FOTO 23  COGNITION: Overall cognitive status: Within functional limits for tasks assessed     SENSATION: Hands sometimes  EDEMA:  Right knee normal for this time s/p    POSTURE: flexed trunk   PALPATION: General soreness normal for this time s/p  LOWER EXTREMITY ROM:  Active ROM Right eval Right 6/20 Right  7/9  Hip flexion     Hip extension     Hip abduction     Hip adduction     Hip internal rotation     Hip external rotation     Knee flexion 72 sitting Supine : 90 100  Knee extension -27 supine -15 10  Ankle dorsiflexion     Ankle plantarflexion     Ankle inversion     Ankle eversion      (Blank rows = not tested)  LOWER EXTREMITY MMT:  MMT Right eval Left eval  Hip flexion 3- 4  Hip extension    Hip abduction    Hip adduction    Hip internal rotation    Hip external rotation  Knee flexion    Knee extension 2- 4-  Ankle dorsiflexion 4 4+  Ankle plantarflexion    Ankle inversion    Ankle eversion     (Blank rows = not tested)  FUNCTIONAL TESTS:  5 times sit to stand: 29.96 using hands to push up to standing 2 minute walk test: next visit  GAIT: Distance walked: 50 ft in clinic Assistive device utilized: Walker - 2 wheeled Level of assistance: SBA Comments: initially was using a standard bariatric walker; switched to RW in clinic and instructed on purchasing a smaller more appropriate walker for ambulation with her and her granddaughter; gave information on "[employer]"   TODAY'S TREATMENT:                                                                                                                              DATE:  06/18/23:  adjusted walker Bike rocking x 6' Heel raise x 10 Knee flexion x 10 Squat x 10  Step up 4"  x 10 Long arc quad x 10 with bending  as far as possible x 15  SAQ x 15 Heel slide x 5 SLR x 5 x 2  Bridge x 10  Active hamstring stretch x 3 Quad set x 10  Hip abduction x 10  Manual to decrease pain and edema Patella mob to improve ROM    06/12/2023 Gt with RW x 61ft:  Pt states that she is fatigued.  Urged pt to walk every 2 hours for at least 2-5 minutes  Sitting:  LAQ going into flexion as far as possible x 10  Supine: quad set x 10                Heel slide x 10               SLR x 5 due to c/o increased back pain               SAQ x 10                Bridge x 5  Standing heel raises x 10 Manual to decrease pain and edema Patella mob to improve ROM                  05/30/23 physical therapy evaluation and HEP instruction    PATIENT EDUCATION:  Education details: Patient educated on exam findings, POC, scope of PT, HEP. Person educated: Patient Education method: Explanation, Demonstration, and Handouts Education comprehension: verbalized understanding, returned demonstration, verbal cues required, and tactile cues required  HOME EXERCISE PROGRAM: Access Code: [email or web address] URL: [email or web address] Date: 05/30/2023 Prepared by: [facility]  Exercises - Supine Quad Set  - 3 x daily - 7 x weekly - 2 sets - 10 reps - 5" hold - Supine Heel Slide  - 3 x daily - 7 x weekly - 1 sets - 10 reps - Seated Heel Toe Raises  -  3 x daily - 7 x weekly - 1 sets - 10 reps - Seated Heel Slide  - 3 x daily - 7 x weekly - 1 sets - 10 reps - Seated March  - 3 x daily - 7 x weekly - 2 sets - 10 reps - Seated Long Arc Quad  - 3 x daily - 7 x weekly - 1 sets - 10 reps  06/12/23  Supine Knee Extension Strengthening  - 2 x daily - 7 x weekly - 1 sets - 10 reps - 3-5" hold - Sit to Stand Without Arm Support  - 2 x daily - 7 x weekly - 1 sets - 10 reps - Heel Raises with Counter Support  - 2 x daily - 7 x weekly - 1 sets - 10 reps - 3-5 " hold   CLINICAL IMPRESSION: Therapist continued to advance exercise to increase  ROM and strength.    Patient  continues to demonstrates muscle weakness, reduced ROM, and fascial restrictions which are likely contributing to symptoms of pain and are negatively impacting patient ability to perform ADLs and functional mobility tasks. Patient will benefit from skilled physical therapy services to address these deficits to reduce pain and improve level of function with ADLs and functional mobility tasks.   OBJECTIVE IMPAIRMENTS: Abnormal gait, decreased activity tolerance, decreased balance, decreased endurance, decreased knowledge of use of DME, decreased mobility, difficulty walking, decreased ROM, decreased strength, hypomobility, increased edema, increased fascial restrictions, impaired perceived functional ability, impaired flexibility, postural dysfunction, and pain.   ACTIVITY LIMITATIONS: carrying, lifting, bending, sitting, standing, squatting, sleeping, stairs, transfers, bed mobility, locomotion level, and caring for others  PARTICIPATION LIMITATIONS: meal prep, cleaning, laundry, driving, shopping, community activity, and yard work  [facility] POTENTIAL: Good  CLINICAL DECISION MAKING: Stable/uncomplicated  EVALUATION COMPLEXITY: Low   GOALS: Goals reviewed with patient? No  SHORT TERM GOALS: Target date: 06/20/2023 patient will be independent with initial HEP  Baseline: Goal status: MET  2.  Patient will self report 30% improvement to improve tolerance for functional activity  Baseline:  Goal status: IN PROGRESS  3.  Patient will improve right knee mobility to -10 to 90 degrees to promote normal knee mechanics with ambulation Baseline:  Goal status: MET   LONG TERM GOALS: Target date: 07/11/2023  Patient will be independent in self management strategies to improve quality of life and functional outcomes.  Baseline:  Goal status: IN PROGRESS  2.  Patient will self report 50% improvement to improve tolerance for functional activity  Baseline:  Goal  status: IN PROGRESS  3.  Patient will increase right  leg MMTs to 4+ to 5/5 without pain to promote return to ambulation community distances with minimal deviation.    Baseline: see above Goal status: IN PROGRESS  4.  Patient will increased right knee mobility to -5 to 120 to promote normal navigation of steps; step over step pattern  Baseline: see above Goal status: IN PROGRESS  5.  Patient will improve 5 times sit to stand score from 29.96 sec to 20 sec without use of arms to assist to demonstrate improved functional mobility and increased lower extremity strength.  Baseline:  Goal status: IN PROGRESS  6.  Patient will improve FOTO score by 20 points   Baseline: 23 Goal status: IN PROGRESS   PLAN:  PT FREQUENCY: 2x/week  PT DURATION: 6 weeks  PLANNED INTERVENTIONS: Therapeutic exercises, Therapeutic activity, Neuromuscular re-education, Balance training, Gait training, Patient/Family education, Joint manipulation, Joint mobilization, Stair training,  Orthotic/Fit training, DME instructions, Aquatic Therapy, Dry Needling, Electrical stimulation, Spinal manipulation, Spinal mobilization, Cryotherapy, Moist heat, Compression bandaging, scar mobilization, Splintting, Taping, Traction, Ultrasound, Ionotophoresis 4mg /ml Dexamethasone, and Manual therapy   PLAN FOR NEXT SESSION: Review HEP and goals; progress knee mobility and strength as able; gait and balance training;  Virgina Organ, PT CLT [phone]  855

## 2023-06-21 ENCOUNTER — Ambulatory Visit (HOSPITAL_COMMUNITY): Payer: 59 | Admitting: Physical Therapy

## 2023-06-21 DIAGNOSIS — R262 Difficulty in walking, not elsewhere classified: Secondary | ICD-10-CM | POA: Diagnosis not present

## 2023-06-21 DIAGNOSIS — M25561 Pain in right knee: Secondary | ICD-10-CM | POA: Diagnosis not present

## 2023-06-21 DIAGNOSIS — M25661 Stiffness of right knee, not elsewhere classified: Secondary | ICD-10-CM

## 2023-06-21 NOTE — Therapy (Signed)
OUTPATIENT PHYSICAL THERAPY LOWER EXTREMITY EVALUATION   Patient Name: Bianca Mcguire MRN: [id number] DOB:07-Aug-1946, 77 y.o., female Today's Date: 06/21/2023  END OF SESSION:  PT End of Session - 06/21/23 1033    Visit Number 4    Number of Visits 12    Date for PT Re-Evaluation 07/11/23    Authorization Type [employer]; no vl    PT Start Time [phone]    PT Stop Time 1033    PT Time Calculation (min) 40 min    Activity Tolerance Patient tolerated treatment well    Behavior During Therapy [facility] for tasks assessed/performed            Past Medical History:  Diagnosis Date   Acute pain of right knee 01/30/2011   Qualifier: Diagnosis of  By: Lodema Hong MD, Zarea     Anemia    Arthritis    Carpal tunnel syndrome, bilateral    COPD (chronic obstructive pulmonary disease) (HCC)    Diverticulosis dx 2011   GERD (gastroesophageal reflux disease)    Headache    Hypertension 06/02/2018   Hypothyroidism    Low back pain    Obesity    Rheumatoid arthritis(714.0)    Past Surgical History:  Procedure Laterality Date   bilateral foot surgery     COLONOSCOPY  2011   Dr. Darrick Penna: 3mm sessile polyp (tubular adenoma), pancolonic diverticulosis   COLONOSCOPY N/A 09/26/2017   diverticulosis, redundant colon   COLONOSCOPY WITH PROPOFOL N/A 05/01/2022   Procedure: COLONOSCOPY WITH PROPOFOL;  Surgeon: Lanelle Bal, DO;  Location: [facility];  Service: Endoscopy;  Laterality: N/A;  11:30am   ESOPHAGOGASTRODUODENOSCOPY N/A 09/16/2017   Dr. Darrick Penna: multiple gastric polyps, (fundic gland), mild gastritis, path with duodenal mucosa with focal villous blunting and surface erosions, moderate chronic gastritis with intestinal metaplasia and reactive changes. no H.pylori, dysplasia, or malignancy   FLEXIBLE SIGMOIDOSCOPY N/A 09/16/2017   Procedure: FLEXIBLE SIGMOIDOSCOPY;  Surgeon: West Bali, MD;  Location: [facility];  Service: Endoscopy;  Laterality: N/A;   HEMORRHOID SURGERY  1980    KNEE ARTHROSCOPY WITH LATERAL MENISECTOMY Right 10/16/2018   Procedure: KNEE ARTHROSCOPY WITH LATERAL MENISCECTOMY;  Surgeon: Vickki Hearing, MD;  Location: [facility];  Service: Orthopedics;  Laterality: Right;   OOPHORECTOMY Right    roophorectomy for benign disease  02/2010   Dr. Emelda Fear   tonsillectomy and adenoidctomy in childhood     TOTAL KNEE ARTHROPLASTY Right 05/28/2023   Procedure: TOTAL KNEE ARTHROPLASTY;  Surgeon: Vickki Hearing, MD;  Location: [facility];  Service: Orthopedics;  Laterality: Right;   TUBAL LIGATION  1977   Patient Active Problem List   Diagnosis Date Noted   S/P total knee replacement, right 05/28/23 06/07/2023   Arthritis of knee, right 05/28/2023   Abnormal CT scan, chest 04/21/2023   Bilateral chronic knee pain 04/19/2023   Annual visit for general adult medical examination with abnormal findings 04/19/2023   Mixed simple and mucopurulent chronic bronchitis (HCC) 03/01/2023   Dysphagia 03/01/2023   Hoarseness of voice 03/01/2023   COPD with acute exacerbation (HCC) 01/30/2023   Positive colorectal cancer screening using Cologuard test 03/27/2022   Herpes zoster 03/15/2022   Reduced vision 10/26/2021   Nausea 09/04/2021   COPD (chronic obstructive pulmonary disease) (HCC) 04/12/2021   Knee pain, bilateral 05/10/2020   Allergic rhinitis 05/10/2020   S/P arthroscopy of right knee 10/16/18 10/23/2018   Meniscus, lateral, derangement, right    Primary osteoarthritis of right knee  Back spasm 08/11/2018   Essential hypertension 06/02/2018   Anemia    Normocytic anemia, not due to blood loss 07/26/2017   Vitamin B12 deficiency 07/26/2017   Shoulder pain, left 05/22/2017   Tubular adenoma of colon 07/17/2015   H/O nicotine dependence 07/17/2015   Unsteady gait 07/05/2014   Back pain with right-sided radiculopathy 06/07/2014   Osteoporosis 04/21/2014   IGT (impaired glucose tolerance) 09/20/2013   Adult hypothyroidism 02/02/2011   Hyperlipidemia  05/03/2010   SHOULDER PAIN, LEFT 05/03/2010   Vitamin D deficiency 01/10/2010   OSTEOARTHRITIS, KNEES, BILATERAL 01/10/2010   Fatigue 01/10/2010   Nephrolithiasis, left 01/10/2010   HAND PAIN, BILATERAL 11/17/2008   VAGINITIS, ATROPHIC 10/05/2008   CARPAL TUNNEL SYNDROME, BILATERAL 09/07/2008   Rheumatoid arthritis (HCC) 09/07/2008   Low back pain with radiation 09/07/2008    PCP: Kerri Perches, MD  REFERRING PROVIDER: Vickki Hearing, MD  REFERRING DIAG: M17.11 (ICD-10-CM) - Unilateral primary osteoarthritis, right knee  THERAPY DIAG:  Acute pain of right knee  Stiffness of right knee, not elsewhere classified  Difficulty in walking, not elsewhere classified  Rationale for Evaluation and Treatment: Rehabilitation  ONSET DATE: 05/28/23  SUBJECTIVE:   SUBJECTIVE STATEMENT: PT states that her knee is sore.  She is doing her exercises without difficulty.    PERTINENT HISTORY: Needs left TKA  PAIN:  Are you having pain? Yes: NPRS scale: 4/10 Pain location: right knee Pain description: sore, throbbing Aggravating factors: getting in and out of the care Relieving factors: medication  PRECAUTIONS: None  WEIGHT BEARING RESTRICTIONS: No  FALLS:  Has patient fallen in last 6 months? No  LIVING ENVIRONMENT: Lives with: lives alone; granddaughter checks on her daily Lives in: House/apartment Stairs: No Has following equipment at home: Single point cane, shower chair, bed side commode, Ramped entry, and None  OCCUPATION: retired  PLOF: Independent  PATIENT GOALS: get back to doing everything  NEXT MD VISIT: 06/11/23  OBJECTIVE:   DIAGNOSTIC FINDINGS:    CLINICAL DATA:  Status post total arthroplasty   EXAM: PORTABLE RIGHT KNEE - 1-2 VIEW   COMPARISON:  04/26/2023   FINDINGS: Status post right knee total arthroplasty with expected overlying postoperative change. No perihardware fracture or component malpositioning.   IMPRESSION: Status post  right knee total arthroplasty with expected overlying postoperative change. No perihardware fracture or component malpositioning.   PATIENT SURVEYS: FOTO 23  COGNITION: Overall cognitive status: Within functional limits for tasks assessed     SENSATION: Hands sometimes  EDEMA:  Right knee normal for this time s/p    POSTURE: flexed trunk   PALPATION: General soreness normal for this time s/p  LOWER EXTREMITY ROM:  Active ROM Right eval Right 6/20 Right  7/9 Right 7/12  Hip flexion      Hip extension      Hip abduction      Hip adduction      Hip internal rotation      Hip external rotation      Knee flexion 72 sitting Supine : 90 100 105  Knee extension -27 supine -15 10 8   Ankle dorsiflexion      Ankle plantarflexion      Ankle inversion      Ankle eversion       (Blank rows = not tested)  LOWER EXTREMITY MMT:  MMT Right eval Left eval  Hip flexion 3- 4  Hip extension    Hip abduction    Hip adduction    Hip internal  rotation    Hip external rotation    Knee flexion    Knee extension 2- 4-  Ankle dorsiflexion 4 4+  Ankle plantarflexion    Ankle inversion    Ankle eversion     (Blank rows = not tested)  FUNCTIONAL TESTS:  5 times sit to stand: 29.96 using hands to push up to standing 2 minute walk test: 06/21/23: with walker:  212 FT    GAIT: Distance walked: 50 ft in clinic Assistive device utilized: Walker - 2 wheeled Level of assistance: SBA Comments: initially was using a standard bariatric walker; switched to RW in clinic and instructed on purchasing a smaller more appropriate walker for ambulation with her and her granddaughter; gave information on "[employer]"   TODAY'S TREATMENT:                                                                                                                              DATE:  06/21/23 Standing: Heel raises x 10 Mini squat x 10 Knee flexion x 10 Standing terminal extension x 10 4" step up x  10  Sititng: LAQ x 10  Sit to stand x 10  Supine: Quad set x 10  Heel slide x 10  SAQ x 10  SLR x 10  Bridge x 10  Hip abduction x 10  Bike x 5 minutes for improved ROM 06/18/23:  adjusted walker Bike rocking x 6' Heel raise x 10 Knee flexion x 10 Squat x 10  Step up 4"  x 10 Long arc quad x 10 with bending as far as possible x 15  SAQ x 15 Heel slide x 5 SLR x 5 x 2  Bridge x 10  Active hamstring stretch x 3 Quad set x 10  Hip abduction x 10    06/12/2023 Gt with RW x 68ft:  Pt states that she is fatigued.  Urged pt to walk every 2 hours for at least 2-5 minutes  Sitting:  LAQ going into flexion as far as possible x 10  Supine: quad set x 10                Heel slide x 10               SLR x 5 due to c/o increased back pain               SAQ x 10                Bridge x 5  Standing heel raises x 10 Manual to decrease pain and edema Patella mob to improve ROM                  05/30/23 physical therapy evaluation and HEP instruction    PATIENT EDUCATION:  Education details: Patient educated on exam findings, POC, scope of PT, HEP. Person educated: Patient Education method: Explanation, Demonstration, and Handouts Education comprehension: verbalized understanding,  returned demonstration, verbal cues required, and tactile cues required  HOME EXERCISE PROGRAM: Access Code: [email or web address] URL: [email or web address] Date: 05/30/2023 Prepared by: [facility]  Exercises - Supine Quad Set  - 3 x daily - 7 x weekly - 2 sets - 10 reps - 5" hold - Supine Heel Slide  - 3 x daily - 7 x weekly - 1 sets - 10 reps - Seated Heel Toe Raises  - 3 x daily - 7 x weekly - 1 sets - 10 reps - Seated Heel Slide  - 3 x daily - 7 x weekly - 1 sets - 10 reps - Seated March  - 3 x daily - 7 x weekly - 2 sets - 10 reps - Seated Long Arc Quad  - 3 x daily - 7 x weekly - 1 sets - 10 reps  06/12/23  Supine Knee Extension Strengthening  - 2 x daily - 7 x weekly - 1 sets - 10 reps -  3-5" hold - Sit to Stand Without Arm Support  - 2 x daily - 7 x weekly - 1 sets - 10 reps - Heel Raises with Counter Support  - 2 x daily - 7 x weekly - 1 sets - 10 reps - 3-5 " hold   CLINICAL IMPRESSION: Therapist continued to advance exercise to increase ROM and strength with pt needing multiple breaks throughout treatment.  ROM continues to improve   Patient  continues to demonstrates muscle weakness, reduced ROM, and fascial restrictions which are likely contributing to symptoms of pain and are negatively impacting patient ability to perform ADLs and functional mobility tasks. Patient will benefit from skilled physical therapy services to address these deficits to reduce pain and improve level of function with ADLs and functional mobility tasks.   OBJECTIVE IMPAIRMENTS: Abnormal gait, decreased activity tolerance, decreased balance, decreased endurance, decreased knowledge of use of DME, decreased mobility, difficulty walking, decreased ROM, decreased strength, hypomobility, increased edema, increased fascial restrictions, impaired perceived functional ability, impaired flexibility, postural dysfunction, and pain.   ACTIVITY LIMITATIONS: carrying, lifting, bending, sitting, standing, squatting, sleeping, stairs, transfers, bed mobility, locomotion level, and caring for others  PARTICIPATION LIMITATIONS: meal prep, cleaning, laundry, driving, shopping, community activity, and yard work  [facility] POTENTIAL: Good  CLINICAL DECISION MAKING: Stable/uncomplicated  EVALUATION COMPLEXITY: Low   GOALS: Goals reviewed with patient? No  SHORT TERM GOALS: Target date: 06/20/2023 patient will be independent with initial HEP  Baseline: Goal status: MET  2.  Patient will self report 30% improvement to improve tolerance for functional activity  Baseline:  Goal status: IN PROGRESS  3.  Patient will improve right knee mobility to -10 to 90 degrees to promote normal knee mechanics with  ambulation Baseline:  Goal status: MET   LONG TERM GOALS: Target date: 07/11/2023  Patient will be independent in self management strategies to improve quality of life and functional outcomes.  Baseline:  Goal status: IN PROGRESS  2.  Patient will self report 50% improvement to improve tolerance for functional activity  Baseline:  Goal status: IN PROGRESS  3.  Patient will increase right  leg MMTs to 4+ to 5/5 without pain to promote return to ambulation community distances with minimal deviation.    Baseline: see above Goal status: IN PROGRESS  4.  Patient will increased right knee mobility to -5 to 120 to promote normal navigation of steps; step over step pattern  Baseline: see above Goal status: IN PROGRESS  5.  Patient will improve 5  times sit to stand score from 29.96 sec to 20 sec without use of arms to assist to demonstrate improved functional mobility and increased lower extremity strength.  Baseline:  Goal status: IN PROGRESS  6.  Patient will improve FOTO score by 20 points   Baseline: 23 Goal status: IN PROGRESS   PLAN:  PT FREQUENCY: 2x/week  PT DURATION: 6 weeks  PLANNED INTERVENTIONS: Therapeutic exercises, Therapeutic activity, Neuromuscular re-education, Balance training, Gait training, Patient/Family education, Joint manipulation, Joint mobilization, Stair training, Orthotic/Fit training, DME instructions, Aquatic Therapy, Dry Needling, Electrical stimulation, Spinal manipulation, Spinal mobilization, Cryotherapy, Moist heat, Compression bandaging, scar mobilization, Splintting, Taping, Traction, Ultrasound, Ionotophoresis 4mg /ml Dexamethasone, and Manual therapy   PLAN FOR NEXT SESSION:  progress knee mobility and strength as able; gait and balance training  Virgina Organ, PT CLT [phone]  [phone]

## 2023-06-27 ENCOUNTER — Other Ambulatory Visit: Payer: Self-pay | Admitting: Orthopedic Surgery

## 2023-06-28 ENCOUNTER — Ambulatory Visit (HOSPITAL_COMMUNITY): Payer: 59 | Admitting: Physical Therapy

## 2023-06-28 DIAGNOSIS — M25561 Pain in right knee: Secondary | ICD-10-CM | POA: Diagnosis not present

## 2023-06-28 DIAGNOSIS — M25661 Stiffness of right knee, not elsewhere classified: Secondary | ICD-10-CM | POA: Diagnosis not present

## 2023-06-28 DIAGNOSIS — R262 Difficulty in walking, not elsewhere classified: Secondary | ICD-10-CM

## 2023-06-28 NOTE — Therapy (Signed)
OUTPATIENT PHYSICAL THERAPY LOWER EXTREMITY EVALUATION   Patient Name: Bianca Mcguire MRN: [id number] DOB:December 02, 1946, 77 y.o., female Today's Date: 06/28/2023  END OF SESSION:  PT End of Session - 06/28/23 0857     Visit Number 5    Number of Visits 12    Date for PT Re-Evaluation 07/11/23    Authorization Type [employer]; no vl    PT Start Time 0815    PT Stop Time 0857    PT Time Calculation (min) 42 min    Activity Tolerance Patient tolerated treatment well    Behavior During Therapy [facility] for tasks assessed/performed                Past Medical History:  Diagnosis Date   Acute pain of right knee 01/30/2011   Qualifier: Diagnosis of  By: Lodema Hong MD, Quaniyah     Anemia    Arthritis    Carpal tunnel syndrome, bilateral    COPD (chronic obstructive pulmonary disease) (HCC)    Diverticulosis dx 2011   GERD (gastroesophageal reflux disease)    Headache    Hypertension 06/02/2018   Hypothyroidism    Low back pain    Obesity    Rheumatoid arthritis(714.0)    Past Surgical History:  Procedure Laterality Date   bilateral foot surgery     COLONOSCOPY  2011   Dr. Darrick Penna: 3mm sessile polyp (tubular adenoma), pancolonic diverticulosis   COLONOSCOPY N/A 09/26/2017   diverticulosis, redundant colon   COLONOSCOPY WITH PROPOFOL N/A 05/01/2022   Procedure: COLONOSCOPY WITH PROPOFOL;  Surgeon: Lanelle Bal, DO;  Location: [facility];  Service: Endoscopy;  Laterality: N/A;  11:30am   ESOPHAGOGASTRODUODENOSCOPY N/A 09/16/2017   Dr. Darrick Penna: multiple gastric polyps, (fundic gland), mild gastritis, path with duodenal mucosa with focal villous blunting and surface erosions, moderate chronic gastritis with intestinal metaplasia and reactive changes. no H.pylori, dysplasia, or malignancy   FLEXIBLE SIGMOIDOSCOPY N/A 09/16/2017   Procedure: FLEXIBLE SIGMOIDOSCOPY;  Surgeon: West Bali, MD;  Location: [facility];  Service: Endoscopy;  Laterality: N/A;   HEMORRHOID SURGERY   1980   KNEE ARTHROSCOPY WITH LATERAL MENISECTOMY Right 10/16/2018   Procedure: KNEE ARTHROSCOPY WITH LATERAL MENISCECTOMY;  Surgeon: Vickki Hearing, MD;  Location: [facility];  Service: Orthopedics;  Laterality: Right;   OOPHORECTOMY Right    roophorectomy for benign disease  02/2010   Dr. Emelda Fear   tonsillectomy and adenoidctomy in childhood     TOTAL KNEE ARTHROPLASTY Right 05/28/2023   Procedure: TOTAL KNEE ARTHROPLASTY;  Surgeon: Vickki Hearing, MD;  Location: [facility];  Service: Orthopedics;  Laterality: Right;   TUBAL LIGATION  1977   Patient Active Problem List   Diagnosis Date Noted   S/P total knee replacement, right 05/28/23 06/07/2023   Arthritis of knee, right 05/28/2023   Abnormal CT scan, chest 04/21/2023   Bilateral chronic knee pain 04/19/2023   Annual visit for general adult medical examination with abnormal findings 04/19/2023   Mixed simple and mucopurulent chronic bronchitis (HCC) 03/01/2023   Dysphagia 03/01/2023   Hoarseness of voice 03/01/2023   COPD with acute exacerbation (HCC) 01/30/2023   Positive colorectal cancer screening using Cologuard test 03/27/2022   Herpes zoster 03/15/2022   Reduced vision 10/26/2021   Nausea 09/04/2021   COPD (chronic obstructive pulmonary disease) (HCC) 04/12/2021   Knee pain, bilateral 05/10/2020   Allergic rhinitis 05/10/2020   S/P arthroscopy of right knee 10/16/18 10/23/2018   Meniscus, lateral, derangement, right    Primary osteoarthritis  of right knee    Back spasm 08/11/2018   Essential hypertension 06/02/2018   Anemia    Normocytic anemia, not due to blood loss 07/26/2017   Vitamin B12 deficiency 07/26/2017   Shoulder pain, left 05/22/2017   Tubular adenoma of colon 07/17/2015   H/O nicotine dependence 07/17/2015   Unsteady gait 07/05/2014   Back pain with right-sided radiculopathy 06/07/2014   Osteoporosis 04/21/2014   IGT (impaired glucose tolerance) 09/20/2013   Adult hypothyroidism 02/02/2011    Hyperlipidemia 05/03/2010   SHOULDER PAIN, LEFT 05/03/2010   Vitamin D deficiency 01/10/2010   OSTEOARTHRITIS, KNEES, BILATERAL 01/10/2010   Fatigue 01/10/2010   Nephrolithiasis, left 01/10/2010   HAND PAIN, BILATERAL 11/17/2008   VAGINITIS, ATROPHIC 10/05/2008   CARPAL TUNNEL SYNDROME, BILATERAL 09/07/2008   Rheumatoid arthritis (HCC) 09/07/2008   Low back pain with radiation 09/07/2008    PCP: Kerri Perches, MD  REFERRING PROVIDER: Vickki Hearing, MD  REFERRING DIAG: M17.11 (ICD-10-CM) - Unilateral primary osteoarthritis, right knee  THERAPY DIAG:  Acute pain of right knee  Stiffness of right knee, not elsewhere classified  Difficulty in walking, not elsewhere classified  Rationale for Evaluation and Treatment: Rehabilitation  ONSET DATE: 05/28/23  SUBJECTIVE:   SUBJECTIVE STATEMENT: PT states that she has been doing her exercises a little bit.   PERTINENT HISTORY: Needs left TKA  PAIN:  Are you having pain? Yes: NPRS scale: 4/10 Pain location: right knee Pain description: sore, throbbing Aggravating factors: getting in and out of the care Relieving factors: medication  PRECAUTIONS: None  WEIGHT BEARING RESTRICTIONS: No  FALLS:  Has patient fallen in last 6 months? No  LIVING ENVIRONMENT: Lives with: lives alone; granddaughter checks on her daily Lives in: House/apartment Stairs: No Has following equipment at home: Single point cane, shower chair, bed side commode, Ramped entry, and None  OCCUPATION: retired  PLOF: Independent  PATIENT GOALS: get back to doing everything  NEXT MD VISIT: 06/11/23  OBJECTIVE:   DIAGNOSTIC FINDINGS:    CLINICAL DATA:  Status post total arthroplasty   EXAM: PORTABLE RIGHT KNEE - 1-2 VIEW   COMPARISON:  04/26/2023   FINDINGS: Status post right knee total arthroplasty with expected overlying postoperative change. No perihardware fracture or component malpositioning.   IMPRESSION: Status post right  knee total arthroplasty with expected overlying postoperative change. No perihardware fracture or component malpositioning.   PATIENT SURVEYS: FOTO 23  COGNITION: Overall cognitive status: Within functional limits for tasks assessed     SENSATION: Hands sometimes  EDEMA:  Right knee normal for this time s/p    POSTURE: flexed trunk   PALPATION: General soreness normal for this time s/p  LOWER EXTREMITY ROM:  Active ROM Right eval Right 6/20 Right  7/9 Right 7/12 Right 7/19  Hip flexion       Hip extension       Hip abduction       Hip adduction       Hip internal rotation       Hip external rotation       Knee flexion 72 sitting Supine : 90 100 105 110  Knee extension -27 supine -15 10 8 6   Ankle dorsiflexion       Ankle plantarflexion       Ankle inversion       Ankle eversion        (Blank rows = not tested)  LOWER EXTREMITY MMT:  MMT Right eval Left eval  Hip flexion 3- 4  Hip  extension    Hip abduction    Hip adduction    Hip internal rotation    Hip external rotation    Knee flexion    Knee extension 2- 4-  Ankle dorsiflexion 4 4+  Ankle plantarflexion    Ankle inversion    Ankle eversion     (Blank rows = not tested)  FUNCTIONAL TESTS:  5 times sit to stand: 29.96 using hands to push up to standing 2 minute walk test: 06/21/23: with walker:  212 FT    GAIT: Distance walked: 50 ft in clinic Assistive device utilized: Walker - 2 wheeled Level of assistance: SBA Comments: initially was using a standard bariatric walker; switched to RW in clinic and instructed on purchasing a smaller more appropriate walker for ambulation with her and her granddaughter; gave information on "[employer]"   TODAY'S TREATMENT:                                                                                                                              DATE:  06/28/23 Rocker board x 2 minutes Heel raises 15 Terminal knee extension x 10 Knee flexion x 10   Hamstring stretch on 12" stool 30" hold x 2 Lunge on 12" stool to improve flexion 30" x 2  Minisquat x 10 6" step up x 10 Sitting sit to stand x 10 LAQ x 10   Bike  x 5 minutes  06/21/23 Standing: Heel raises x 10 Mini squat x 10 Knee flexion x 10 Standing terminal extension x 10 4" step up x 10  Sititng: LAQ x 10  Sit to stand x 10  Supine: Quad set x 10  Heel slide x 10  SAQ x 10  SLR x 10  Bridge x 10  Hip abduction x 10  Bike x 5 minutes for improved ROM 06/18/23:  adjusted walker Bike rocking x 6' Heel raise x 10 Knee flexion x 10 Squat x 10  Step up 4"  x 10 Long arc quad x 10 with bending as far as possible x 15  SAQ x 15 Heel slide x 5 SLR x 5 x 2  Bridge x 10  Active hamstring stretch x 3 Quad set x 10  Hip abduction x 10    06/12/2023 Gt with RW x 67ft:  Pt states that she is fatigued.  Urged pt to walk every 2 hours for at least 2-5 minutes  Sitting:  LAQ going into flexion as far as possible x 10  Supine: quad set x 10                Heel slide x 10               SLR x 5 due to c/o increased back pain               SAQ x 10  Bridge x 5  Standing heel raises x 10 Manual to decrease pain and edema Patella mob to improve ROM                  05/30/23 physical therapy evaluation and HEP instruction    PATIENT EDUCATION:  Education details: Patient educated on exam findings, POC, scope of PT, HEP. Person educated: Patient Education method: Explanation, Demonstration, and Handouts Education comprehension: verbalized understanding, returned demonstration, verbal cues required, and tactile cues required  HOME EXERCISE PROGRAM: Access Code: [email or web address] URL: [email or web address] Date: 05/30/2023 Prepared by: [facility]  Exercises - Supine Quad Set  - 3 x daily - 7 x weekly - 2 sets - 10 reps - 5" hold - Supine Heel Slide  - 3 x daily - 7 x weekly - 1 sets - 10 reps - Seated Heel Toe Raises  - 3 x daily - 7 x weekly - 1  sets - 10 reps - Seated Heel Slide  - 3 x daily - 7 x weekly - 1 sets - 10 reps - Seated March  - 3 x daily - 7 x weekly - 2 sets - 10 reps - Seated Long Arc Quad  - 3 x daily - 7 x weekly - 1 sets - 10 reps  06/12/23  Supine Knee Extension Strengthening  - 2 x daily - 7 x weekly - 1 sets - 10 reps - 3-5" hold - Sit to Stand Without Arm Support  - 2 x daily - 7 x weekly - 1 sets - 10 reps - Heel Raises with Counter Support  - 2 x daily - 7 x weekly - 1 sets - 10 reps - 3-5 " hold  06/28/23 - Mini Squat with Counter Support  - 2 x daily - 7 x weekly - 1 sets - 10 reps - 5" hold CLINICAL IMPRESSION: Pt continues to need multiple breaks throughout treatment.  ROM continues to improve and pt is no longer using an assistive device to ambulate.  Therapist continues to urge pt to stay out of bed and walk more to improve endurane.  ROM continues to improve   Patient  continues to demonstrates muscle weakness, reduced ROM, and fascial restrictions which are likely contributing to symptoms of pain and are negatively impacting patient ability to perform ADLs and functional mobility tasks. Patient will benefit from skilled physical therapy services to address these deficits to reduce pain and improve level of function with ADLs and functional mobility tasks.   OBJECTIVE IMPAIRMENTS: Abnormal gait, decreased activity tolerance, decreased balance, decreased endurance, decreased knowledge of use of DME, decreased mobility, difficulty walking, decreased ROM, decreased strength, hypomobility, increased edema, increased fascial restrictions, impaired perceived functional ability, impaired flexibility, postural dysfunction, and pain.   ACTIVITY LIMITATIONS: carrying, lifting, bending, sitting, standing, squatting, sleeping, stairs, transfers, bed mobility, locomotion level, and caring for others  PARTICIPATION LIMITATIONS: meal prep, cleaning, laundry, driving, shopping, community activity, and yard work  [facility]  POTENTIAL: Good  CLINICAL DECISION MAKING: Stable/uncomplicated  EVALUATION COMPLEXITY: Low   GOALS: Goals reviewed with patient? No  SHORT TERM GOALS: Target date: 06/20/2023 patient will be independent with initial HEP  Baseline: Goal status: MET  2.  Patient will self report 30% improvement to improve tolerance for functional activity  Baseline:  Goal status: IN PROGRESS  3.  Patient will improve right knee mobility to -10 to 90 degrees to promote normal knee mechanics with ambulation Baseline:  Goal status: MET   LONG TERM GOALS:  Target date: 07/11/2023  Patient will be independent in self management strategies to improve quality of life and functional outcomes.  Baseline:  Goal status: IN PROGRESS  2.  Patient will self report 50% improvement to improve tolerance for functional activity  Baseline:  Goal status: IN PROGRESS  3.  Patient will increase right  leg MMTs to 4+ to 5/5 without pain to promote return to ambulation community distances with minimal deviation.    Baseline: see above Goal status: IN PROGRESS  4.  Patient will increased right knee mobility to -5 to 120 to promote normal navigation of steps; step over step pattern  Baseline: see above Goal status: IN PROGRESS  5.  Patient will improve 5 times sit to stand score from 29.96 sec to 20 sec without use of arms to assist to demonstrate improved functional mobility and increased lower extremity strength.  Baseline:  Goal status: IN PROGRESS  6.  Patient will improve FOTO score by 20 points   Baseline: 23 Goal status: IN PROGRESS   PLAN:  PT FREQUENCY: 2x/week  PT DURATION: 6 weeks  PLANNED INTERVENTIONS: Therapeutic exercises, Therapeutic activity, Neuromuscular re-education, Balance training, Gait training, Patient/Family education, Joint manipulation, Joint mobilization, Stair training, Orthotic/Fit training, DME instructions, Aquatic Therapy, Dry Needling, Electrical stimulation,  Spinal manipulation, Spinal mobilization, Cryotherapy, Moist heat, Compression bandaging, scar mobilization, Splintting, Taping, Traction, Ultrasound, Ionotophoresis 4mg /ml Dexamethasone, and Manual therapy   PLAN FOR NEXT SESSION:  progress knee mobility and strength as able; gait and balance training  Virgina Organ, PT CLT [phone]  [phone]

## 2023-07-02 ENCOUNTER — Ambulatory Visit (HOSPITAL_COMMUNITY): Payer: 59 | Admitting: Physical Therapy

## 2023-07-02 DIAGNOSIS — M25661 Stiffness of right knee, not elsewhere classified: Secondary | ICD-10-CM | POA: Diagnosis not present

## 2023-07-02 DIAGNOSIS — R262 Difficulty in walking, not elsewhere classified: Secondary | ICD-10-CM | POA: Diagnosis not present

## 2023-07-02 DIAGNOSIS — M25561 Pain in right knee: Secondary | ICD-10-CM

## 2023-07-02 NOTE — Therapy (Signed)
OUTPATIENT PHYSICAL THERAPY LOWER EXTREMITY EVALUATION   Patient Name: Bianca Mcguire MRN: [id number] DOB:1946-08-13, 77 y.o., female Today's Date: 07/02/2023  END OF SESSION:  PT End of Session - 07/02/23 0855     Visit Number 6    Number of Visits 12    Date for PT Re-Evaluation 07/11/23    Authorization Type U[employer]; no vl    PT Start Time 0815    PT Stop Time 0855    PT Time Calculation (min) 40 min    Activity Tolerance Patient tolerated treatment well    Behavior During Therapy [facility] for tasks assessed/performed            Past Medical History:  Diagnosis Date   Acute pain of right knee 01/30/2011   Qualifier: Diagnosis of  By: Lodema Hong MD, Francee     Anemia    Arthritis    Carpal tunnel syndrome, bilateral    COPD (chronic obstructive pulmonary disease) (HCC)    Diverticulosis dx 2011   GERD (gastroesophageal reflux disease)    Headache    Hypertension 06/02/2018   Hypothyroidism    Low back pain    Obesity    Rheumatoid arthritis(714.0)    Past Surgical History:  Procedure Laterality Date   bilateral foot surgery     COLONOSCOPY  2011   Dr. Darrick Penna: 3mm sessile polyp (tubular adenoma), pancolonic diverticulosis   COLONOSCOPY N/A 09/26/2017   diverticulosis, redundant colon   COLONOSCOPY WITH PROPOFOL N/A 05/01/2022   Procedure: COLONOSCOPY WITH PROPOFOL;  Surgeon: Lanelle Bal, DO;  Location: [facility];  Service: Endoscopy;  Laterality: N/A;  11:30am   ESOPHAGOGASTRODUODENOSCOPY N/A 09/16/2017   Dr. Darrick Penna: multiple gastric polyps, (fundic gland), mild gastritis, path with duodenal mucosa with focal villous blunting and surface erosions, moderate chronic gastritis with intestinal metaplasia and reactive changes. no H.pylori, dysplasia, or malignancy   FLEXIBLE SIGMOIDOSCOPY N/A 09/16/2017   Procedure: FLEXIBLE SIGMOIDOSCOPY;  Surgeon: West Bali, MD;  Location: [facility];  Service: Endoscopy;  Laterality: N/A;   HEMORRHOID SURGERY  1980    KNEE ARTHROSCOPY WITH LATERAL MENISECTOMY Right 10/16/2018   Procedure: KNEE ARTHROSCOPY WITH LATERAL MENISCECTOMY;  Surgeon: Vickki Hearing, MD;  Location: [facility];  Service: Orthopedics;  Laterality: Right;   OOPHORECTOMY Right    roophorectomy for benign disease  02/2010   Dr. Emelda Fear   tonsillectomy and adenoidctomy in childhood     TOTAL KNEE ARTHROPLASTY Right 05/28/2023   Procedure: TOTAL KNEE ARTHROPLASTY;  Surgeon: Vickki Hearing, MD;  Location: [facility];  Service: Orthopedics;  Laterality: Right;   TUBAL LIGATION  1977   Patient Active Problem List   Diagnosis Date Noted   S/P total knee replacement, right 05/28/23 06/07/2023   Arthritis of knee, right 05/28/2023   Abnormal CT scan, chest 04/21/2023   Bilateral chronic knee pain 04/19/2023   Annual visit for general adult medical examination with abnormal findings 04/19/2023   Mixed simple and mucopurulent chronic bronchitis (HCC) 03/01/2023   Dysphagia 03/01/2023   Hoarseness of voice 03/01/2023   COPD with acute exacerbation (HCC) 01/30/2023   Positive colorectal cancer screening using Cologuard test 03/27/2022   Herpes zoster 03/15/2022   Reduced vision 10/26/2021   Nausea 09/04/2021   COPD (chronic obstructive pulmonary disease) (HCC) 04/12/2021   Knee pain, bilateral 05/10/2020   Allergic rhinitis 05/10/2020   S/P arthroscopy of right knee 10/16/18 10/23/2018   Meniscus, lateral, derangement, right    Primary osteoarthritis of right knee  Back spasm 08/11/2018   Essential hypertension 06/02/2018   Anemia    Normocytic anemia, not due to blood loss 07/26/2017   Vitamin B12 deficiency 07/26/2017   Shoulder pain, left 05/22/2017   Tubular adenoma of colon 07/17/2015   H/O nicotine dependence 07/17/2015   Unsteady gait 07/05/2014   Back pain with right-sided radiculopathy 06/07/2014   Osteoporosis 04/21/2014   IGT (impaired glucose tolerance) 09/20/2013   Adult hypothyroidism 02/02/2011   Hyperlipidemia  05/03/2010   SHOULDER PAIN, LEFT 05/03/2010   Vitamin D deficiency 01/10/2010   OSTEOARTHRITIS, KNEES, BILATERAL 01/10/2010   Fatigue 01/10/2010   Nephrolithiasis, left 01/10/2010   HAND PAIN, BILATERAL 11/17/2008   VAGINITIS, ATROPHIC 10/05/2008   CARPAL TUNNEL SYNDROME, BILATERAL 09/07/2008   Rheumatoid arthritis (HCC) 09/07/2008   Low back pain with radiation 09/07/2008    PCP: Kerri Perches, MD  REFERRING PROVIDER: Vickki Hearing, MD  REFERRING DIAG: M17.11 (ICD-10-CM) - Unilateral primary osteoarthritis, right knee  THERAPY DIAG:  Acute pain of right knee  Stiffness of right knee, not elsewhere classified  Difficulty in walking, not elsewhere classified  Rationale for Evaluation and Treatment: Rehabilitation  ONSET DATE: 05/28/23  SUBJECTIVE STATEMENT:  Pt states that she is still sore and at times areas seem numb.     PERTINENT HISTORY: Needs left TKA  PAIN:  Are you having pain? Yes: NPRS scale: 3/10 Pain location: right knee Pain description: sore, throbbing Aggravating factors: getting in and out of the care Relieving factors: medication  PRECAUTIONS: None  WEIGHT BEARING RESTRICTIONS: No  FALLS:  Has patient fallen in last 6 months? No  LIVING ENVIRONMENT: Lives with: lives alone; granddaughter checks on her daily Lives in: House/apartment Stairs: No Has following equipment at home: Single point cane, shower chair, bed side commode, Ramped entry, and None  OCCUPATION: retired  PLOF: Independent  PATIENT GOALS: get back to doing everything  NEXT MD VISIT: 06/11/23  OBJECTIVE:   DIAGNOSTIC FINDINGS:    CLINICAL DATA:  Status post total arthroplasty   EXAM: PORTABLE RIGHT KNEE - 1-2 VIEW   COMPARISON:  04/26/2023   FINDINGS: Status post right knee total arthroplasty with expected overlying postoperative change. No perihardware fracture or component malpositioning.   IMPRESSION: Status post right knee total arthroplasty  with expected overlying postoperative change. No perihardware fracture or component malpositioning.   PATIENT SURVEYS: FOTO 23  COGNITION: Overall cognitive status: Within functional limits for tasks assessed     SENSATION: Hands sometimes  EDEMA:  Right knee normal for this time s/p    POSTURE: flexed trunk   PALPATION: General soreness normal for this time s/p  LOWER EXTREMITY ROM:  Active ROM Right eval Right 6/20 Right  7/9 Right 7/12 Right 7/19  Hip flexion       Hip extension       Hip abduction       Hip adduction       Hip internal rotation       Hip external rotation       Knee flexion 72 sitting Supine : 90 100 105 110  Knee extension -27 supine -15 10 8 6   Ankle dorsiflexion       Ankle plantarflexion       Ankle inversion       Ankle eversion        (Blank rows = not tested)  LOWER EXTREMITY MMT:  MMT Right eval Left eval  Hip flexion 3- 4  Hip extension    Hip  abduction    Hip adduction    Hip internal rotation    Hip external rotation    Knee flexion    Knee extension 2- 4-  Ankle dorsiflexion 4 4+  Ankle plantarflexion    Ankle inversion    Ankle eversion     (Blank rows = not tested)  FUNCTIONAL TESTS:  5 times sit to stand: 29.96 using hands to push up to standing 2 minute walk test: 06/21/23: with walker:  212 FT    GAIT: Distance walked: 50 ft in clinic Assistive device utilized: Walker - 2 wheeled Level of assistance: SBA Comments: initially was using a standard bariatric walker; switched to RW in clinic and instructed on purchasing a smaller more appropriate walker for ambulation with her and her granddaughter; gave information on "[employer]"   TODAY'S TREATMENT:                                                                                                                              DATE:  07/02/23 Rocker board x 2 minutes Heel raises 15 Toe raise from slant x 5  Terminal knee extension x 10 Knee flexion x  10  Slant board x 30" x 2  Hamstring stretch on 12" stool 30" hold x 2 Lunge on 12" stool to improve flexion 30" x 2  Minisquat x 10 Lateral step up 4" x 10 6" step up x 10 Sitting sit to stand x 15 Bike  x 5 minutes  Supine : ROM  5-115 SAQ x 15  Bridge x 10  06/28/23 Rocker board x 2 minutes Heel raises 15 Terminal knee extension x 10 Knee flexion x 10  Hamstring stretch on 12" stool 30" hold x 2 Lunge on 12" stool to improve flexion 30" x 2  Minisquat x 10 6" step up x 10 Sitting sit to stand x 10 LAQ x 10   Bike  x 5 minutes  06/21/23 Standing: Heel raises x 10 Mini squat x 10 Knee flexion x 10 Standing terminal extension x 10 4" step up x 10  Sititng: LAQ x 10  Sit to stand x 10  Supine: Quad set x 10  Heel slide x 10  SAQ x 10  SLR x 10  Bridge x 10  Hip abduction x 10  Bike x 5 minutes for improved ROM 06/18/23:  adjusted walker Bike rocking x 6' Heel raise x 10 Knee flexion x 10 Squat x 10  Step up 4"  x 10 Long arc quad x 10 with bending as far as possible x 15  SAQ x 15 Heel slide x 5 SLR x 5 x 2  Bridge x 10  Active hamstring stretch x 3 Quad set x 10  Hip abduction x 10    06/12/2023 Gt with RW x 63ft:  Pt states that she is fatigued.  Urged pt to walk every 2 hours for at least 2-5 minutes  Sitting:  LAQ going into flexion as far as possible x 10  Supine: quad set x 10                Heel slide x 10               SLR x 5 due to c/o increased back pain               SAQ x 10                Bridge x 5  Standing heel raises x 10 Manual to decrease pain and edema Patella mob to improve ROM                  05/30/23 physical therapy evaluation and HEP instruction    PATIENT EDUCATION:  Education details: Patient educated on exam findings, POC, scope of PT, HEP. Person educated: Patient Education method: Explanation, Demonstration, and Handouts Education comprehension: verbalized understanding, returned demonstration, verbal cues  required, and tactile cues required  HOME EXERCISE PROGRAM: Access Code: [email or web address] URL: [email or web address] Date: 05/30/2023 Prepared by: [facility]  Exercises - Supine Quad Set  - 3 x daily - 7 x weekly - 2 sets - 10 reps - 5" hold - Supine Heel Slide  - 3 x daily - 7 x weekly - 1 sets - 10 reps - Seated Heel Toe Raises  - 3 x daily - 7 x weekly - 1 sets - 10 reps - Seated Heel Slide  - 3 x daily - 7 x weekly - 1 sets - 10 reps - Seated March  - 3 x daily - 7 x weekly - 2 sets - 10 reps - Seated Long Arc Quad  - 3 x daily - 7 x weekly - 1 sets - 10 reps  06/12/23  Supine Knee Extension Strengthening  - 2 x daily - 7 x weekly - 1 sets - 10 reps - 3-5" hold - Sit to Stand Without Arm Support  - 2 x daily - 7 x weekly - 1 sets - 10 reps - Heel Raises with Counter Support  - 2 x daily - 7 x weekly - 1 sets - 10 reps - 3-5 " hold  06/28/23 - Mini Squat with Counter Support  - 2 x daily - 7 x weekly - 1 sets - 10 reps - 5" hold CLINICAL IMPRESSION: Pt needing less breaks throughout treatment.  Added slant board for improved extension and lateral step up for improved strength.  Pt states that she can do more around the home now.  ROM continues to improve.  Patient  continues to demonstrates muscle weakness, reduced ROM, and fascial restrictions which are likely contributing to symptoms of pain and are negatively impacting patient ability to perform ADLs and functional mobility tasks. Patient will benefit from skilled physical therapy services to address these deficits to reduce pain and improve level of function with ADLs and functional mobility tasks.   OBJECTIVE IMPAIRMENTS: Abnormal gait, decreased activity tolerance, decreased balance, decreased endurance, decreased knowledge of use of DME, decreased mobility, difficulty walking, decreased ROM, decreased strength, hypomobility, increased edema, increased fascial restrictions, impaired perceived functional ability, impaired  flexibility, postural dysfunction, and pain.   ACTIVITY LIMITATIONS: carrying, lifting, bending, sitting, standing, squatting, sleeping, stairs, transfers, bed mobility, locomotion level, and caring for others  PARTICIPATION LIMITATIONS: meal prep, cleaning, laundry, driving, shopping, community activity, and yard work  [facility] POTENTIAL: Good  CLINICAL DECISION MAKING: Stable/uncomplicated  EVALUATION  COMPLEXITY: Low   GOALS: Goals reviewed with patient? No  SHORT TERM GOALS: Target date: 06/20/2023 patient will be independent with initial HEP  Baseline: Goal status: MET  2.  Patient will self report 30% improvement to improve tolerance for functional activity  Baseline:  Goal status: MET  3.  Patient will improve right knee mobility to -10 to 90 degrees to promote normal knee mechanics with ambulation Baseline:  Goal status: MET   LONG TERM GOALS: Target date: 07/11/2023  Patient will be independent in self management strategies to improve quality of life and functional outcomes.  Baseline:  Goal status: MET  2.  Patient will self report 50% improvement to improve tolerance for functional activity  Baseline:  Goal status: MET  3.  Patient will increase right  leg MMTs to 4+ to 5/5 without pain to promote return to ambulation community distances with minimal deviation.    Baseline: see above Goal status: IN PROGRESS  4.  Patient will increased right knee mobility to -5 to 120 to promote normal navigation of steps; step over step pattern  Baseline: see above Goal status: IN PROGRESS  5.  Patient will improve 5 times sit to stand score from 29.96 sec to 20 sec without use of arms to assist to demonstrate improved functional mobility and increased lower extremity strength.  Baseline:  Goal status: IN PROGRESS  6.  Patient will improve FOTO score by 20 points   Baseline: 23 Goal status: IN PROGRESS   PLAN:  PT FREQUENCY: 2x/week  PT DURATION: 6  weeks  PLANNED INTERVENTIONS: Therapeutic exercises, Therapeutic activity, Neuromuscular re-education, Balance training, Gait training, Patient/Family education, Joint manipulation, Joint mobilization, Stair training, Orthotic/Fit training, DME instructions, Aquatic Therapy, Dry Needling, Electrical stimulation, Spinal manipulation, Spinal mobilization, Cryotherapy, Moist heat, Compression bandaging, scar mobilization, Splintting, Taping, Traction, Ultrasound, Ionotophoresis 4mg /ml Dexamethasone, and Manual therapy   PLAN FOR NEXT SESSION:  progress knee mobility and strength as able; gait and balance training  Virgina Organ, PT CLT [phone]  855

## 2023-07-04 ENCOUNTER — Ambulatory Visit (HOSPITAL_COMMUNITY): Payer: 59 | Admitting: Physical Therapy

## 2023-07-04 DIAGNOSIS — R262 Difficulty in walking, not elsewhere classified: Secondary | ICD-10-CM | POA: Diagnosis not present

## 2023-07-04 DIAGNOSIS — M25561 Pain in right knee: Secondary | ICD-10-CM | POA: Diagnosis not present

## 2023-07-04 DIAGNOSIS — M25661 Stiffness of right knee, not elsewhere classified: Secondary | ICD-10-CM | POA: Diagnosis not present

## 2023-07-04 NOTE — Therapy (Signed)
OUTPATIENT PHYSICAL THERAPY LOWER EXTREMITY EVALUATION   Patient Name: Bianca Mcguire MRN: [id number] DOB:01-08-1946, 77 y.o., female Today's Date: 07/04/2023  END OF SESSION:   PT End of Session - 07/04/23 1117     Visit Number 7    Number of Visits 12    Date for PT Re-Evaluation 07/11/23    Authorization Type U[employer]; no vl    PT Start Time 1115    PT Stop Time 1155    PT Time Calculation (min) 40 min    Activity Tolerance Patient tolerated treatment well    Behavior During Therapy [facility] for tasks assessed/performed                 Past Medical History:  Diagnosis Date   Acute pain of right knee 01/30/2011   Qualifier: Diagnosis of  By: Lodema Hong MD, Ellyana     Anemia    Arthritis    Carpal tunnel syndrome, bilateral    COPD (chronic obstructive pulmonary disease) (HCC)    Diverticulosis dx 2011   GERD (gastroesophageal reflux disease)    Headache    Hypertension 06/02/2018   Hypothyroidism    Low back pain    Obesity    Rheumatoid arthritis(714.0)    Past Surgical History:  Procedure Laterality Date   bilateral foot surgery     COLONOSCOPY  2011   Dr. Darrick Penna: 3mm sessile polyp (tubular adenoma), pancolonic diverticulosis   COLONOSCOPY N/A 09/26/2017   diverticulosis, redundant colon   COLONOSCOPY WITH PROPOFOL N/A 05/01/2022   Procedure: COLONOSCOPY WITH PROPOFOL;  Surgeon: Lanelle Bal, DO;  Location: [facility];  Service: Endoscopy;  Laterality: N/A;  11:30am   ESOPHAGOGASTRODUODENOSCOPY N/A 09/16/2017   Dr. Darrick Penna: multiple gastric polyps, (fundic gland), mild gastritis, path with duodenal mucosa with focal villous blunting and surface erosions, moderate chronic gastritis with intestinal metaplasia and reactive changes. no H.pylori, dysplasia, or malignancy   FLEXIBLE SIGMOIDOSCOPY N/A 09/16/2017   Procedure: FLEXIBLE SIGMOIDOSCOPY;  Surgeon: West Bali, MD;  Location: [facility];  Service: Endoscopy;  Laterality: N/A;   HEMORRHOID SURGERY   1980   KNEE ARTHROSCOPY WITH LATERAL MENISECTOMY Right 10/16/2018   Procedure: KNEE ARTHROSCOPY WITH LATERAL MENISCECTOMY;  Surgeon: Vickki Hearing, MD;  Location: [facility];  Service: Orthopedics;  Laterality: Right;   OOPHORECTOMY Right    roophorectomy for benign disease  02/2010   Dr. Emelda Fear   tonsillectomy and adenoidctomy in childhood     TOTAL KNEE ARTHROPLASTY Right 05/28/2023   Procedure: TOTAL KNEE ARTHROPLASTY;  Surgeon: Vickki Hearing, MD;  Location: [facility];  Service: Orthopedics;  Laterality: Right;   TUBAL LIGATION  1977   Patient Active Problem List   Diagnosis Date Noted   S/P total knee replacement, right 05/28/23 06/07/2023   Arthritis of knee, right 05/28/2023   Abnormal CT scan, chest 04/21/2023   Bilateral chronic knee pain 04/19/2023   Annual visit for general adult medical examination with abnormal findings 04/19/2023   Mixed simple and mucopurulent chronic bronchitis (HCC) 03/01/2023   Dysphagia 03/01/2023   Hoarseness of voice 03/01/2023   COPD with acute exacerbation (HCC) 01/30/2023   Positive colorectal cancer screening using Cologuard test 03/27/2022   Herpes zoster 03/15/2022   Reduced vision 10/26/2021   Nausea 09/04/2021   COPD (chronic obstructive pulmonary disease) (HCC) 04/12/2021   Knee pain, bilateral 05/10/2020   Allergic rhinitis 05/10/2020   S/P arthroscopy of right knee 10/16/18 10/23/2018   Meniscus, lateral, derangement, right  Primary osteoarthritis of right knee    Back spasm 08/11/2018   Essential hypertension 06/02/2018   Anemia    Normocytic anemia, not due to blood loss 07/26/2017   Vitamin B12 deficiency 07/26/2017   Shoulder pain, left 05/22/2017   Tubular adenoma of colon 07/17/2015   H/O nicotine dependence 07/17/2015   Unsteady gait 07/05/2014   Back pain with right-sided radiculopathy 06/07/2014   Osteoporosis 04/21/2014   IGT (impaired glucose tolerance) 09/20/2013   Adult hypothyroidism 02/02/2011    Hyperlipidemia 05/03/2010   SHOULDER PAIN, LEFT 05/03/2010   Vitamin D deficiency 01/10/2010   OSTEOARTHRITIS, KNEES, BILATERAL 01/10/2010   Fatigue 01/10/2010   Nephrolithiasis, left 01/10/2010   HAND PAIN, BILATERAL 11/17/2008   VAGINITIS, ATROPHIC 10/05/2008   CARPAL TUNNEL SYNDROME, BILATERAL 09/07/2008   Rheumatoid arthritis (HCC) 09/07/2008   Low back pain with radiation 09/07/2008    PCP: Kerri Perches, MD  REFERRING PROVIDER: Vickki Hearing, MD  REFERRING DIAG: M17.11 (ICD-10-CM) - Unilateral primary osteoarthritis, right knee  THERAPY DIAG:  Acute pain of right knee  Stiffness of right knee, not elsewhere classified  Rationale for Evaluation and Treatment: Rehabilitation  ONSET DATE: 05/28/23  SUBJECTIVE STATEMENT:  Pt states that she is mostly sore around her knee cap.  Ambulating without AD; doing most things at home still difficulty with stairs.   PERTINENT HISTORY: Needs left TKA  PAIN:  Are you having pain? Yes: NPRS scale: 3/10 Pain location: right knee Pain description: sore, throbbing Aggravating factors: getting in and out of the care Relieving factors: medication  PRECAUTIONS: None  WEIGHT BEARING RESTRICTIONS: No  FALLS:  Has patient fallen in last 6 months? No  LIVING ENVIRONMENT: Lives with: lives alone; granddaughter checks on her daily Lives in: House/apartment Stairs: No Has following equipment at home: Single point cane, shower chair, bed side commode, Ramped entry, and None  OCCUPATION: retired  PLOF: Independent  PATIENT GOALS: get back to doing everything  NEXT MD VISIT: 06/11/23  OBJECTIVE:   DIAGNOSTIC FINDINGS:    CLINICAL DATA:  Status post total arthroplasty   EXAM: PORTABLE RIGHT KNEE - 1-2 VIEW   COMPARISON:  04/26/2023   FINDINGS: Status post right knee total arthroplasty with expected overlying postoperative change. No perihardware fracture or component malpositioning.   IMPRESSION: Status  post right knee total arthroplasty with expected overlying postoperative change. No perihardware fracture or component malpositioning.   PATIENT SURVEYS: FOTO 23  COGNITION: Overall cognitive status: Within functional limits for tasks assessed     SENSATION: Hands sometimes  EDEMA:  Right knee normal for this time s/p    POSTURE: flexed trunk   PALPATION: General soreness normal for this time s/p  LOWER EXTREMITY ROM:  Active ROM Right eval Right 6/20 Right  7/9 Right 7/12 Right 7/19 Right 7/25  Hip flexion        Hip extension        Hip abduction        Hip adduction        Hip internal rotation        Hip external rotation        Knee flexion 72 sitting Supine : 90 100 105 110 120  Knee extension -27 supine -15 10 8 6 5   Ankle dorsiflexion        Ankle plantarflexion        Ankle inversion        Ankle eversion         (Blank rows =  not tested)  LOWER EXTREMITY MMT:  MMT Right eval Left eval  Hip flexion 3- 4  Hip extension    Hip abduction    Hip adduction    Hip internal rotation    Hip external rotation    Knee flexion    Knee extension 2- 4-  Ankle dorsiflexion 4 4+  Ankle plantarflexion    Ankle inversion    Ankle eversion     (Blank rows = not tested)  FUNCTIONAL TESTS:  5 times sit to stand: 29.96 using hands to push up to standing 2 minute walk test: 06/21/23: with walker:  212 FT    GAIT: Distance walked: 50 ft in clinic Assistive device utilized: Walker - 2 wheeled Level of assistance: SBA Comments: initially was using a standard bariatric walker; switched to RW in clinic and instructed on purchasing a smaller more appropriate walker for ambulation with her and her granddaughter; gave information on "[employer]"   TODAY'S TREATMENT:                                                                                                                              DATE:  07/04/23 Bike seat 4 full revolutions 5 minutes Standing:   knee flexion stretch with 12" step 10X10"  Hamstring stretch with 12" step 2X30"  Slant board stretch 2X30"  Forward step ups 4" 1 HHA 10X  Lateral step ups 4" 1 HHA 10X  Forward step downs 4" 1 HHA 10X  Forward lunges 10X bilaterally with UE assist onto 4" step  Stair negotiation 4" with 1 HR reciprocally up/down 1X Supine AROM 5-120 Soft tissue, MFR to knee; self massage instructions  07/02/23 Rocker board x 2 minutes Heel raises 15 Toe raise from slant x 5  Terminal knee extension x 10 Knee flexion x 10  Slant board x 30" x 2  Hamstring stretch on 12" stool 30" hold x 2 Lunge on 12" stool to improve flexion 30" x 2  Minisquat x 10 Lateral step up 4" x 10 6" step up x 10 Sitting sit to stand x 15 Bike  x 5 minutes  Supine : ROM  5-115 SAQ x 15  Bridge x 10  06/28/23 Rocker board x 2 minutes Heel raises 15 Terminal knee extension x 10 Knee flexion x 10  Hamstring stretch on 12" stool 30" hold x 2 Lunge on 12" stool to improve flexion 30" x 2  Minisquat x 10 6" step up x 10 Sitting sit to stand x 10 LAQ x 10   Bike  x 5 minutes  06/21/23 Standing: Heel raises x 10 Mini squat x 10 Knee flexion x 10 Standing terminal extension x 10 4" step up x 10  Sititng: LAQ x 10  Sit to stand x 10  Supine: Quad set x 10  Heel slide x 10  SAQ x 10  SLR x 10  Bridge x 10  Hip abduction x  10  Bike x 5 minutes for improved ROM 06/18/23:  adjusted walker Bike rocking x 6' Heel raise x 10 Knee flexion x 10 Squat x 10  Step up 4"  x 10 Long arc quad x 10 with bending as far as possible x 15  SAQ x 15 Heel slide x 5 SLR x 5 x 2  Bridge x 10  Active hamstring stretch x 3 Quad set x 10  Hip abduction x 10    06/12/2023 Gt with RW x 49ft:  Pt states that she is fatigued.  Urged pt to walk every 2 hours for at least 2-5 minutes  Sitting:  LAQ going into flexion as far as possible x 10  Supine: quad set x 10                Heel slide x 10               SLR x 5 due to c/o  increased back pain               SAQ x 10                Bridge x 5  Standing heel raises x 10 Manual to decrease pain and edema Patella mob to improve ROM                  05/30/23 physical therapy evaluation and HEP instruction    PATIENT EDUCATION:  Education details: Patient educated on exam findings, POC, scope of PT, HEP. Person educated: Patient Education method: Explanation, Demonstration, and Handouts Education comprehension: verbalized understanding, returned demonstration, verbal cues required, and tactile cues required  HOME EXERCISE PROGRAM: Access Code: [email or web address] URL: [email or web address] Date: 05/30/2023 Prepared by: [facility]  Exercises - Supine Quad Set  - 3 x daily - 7 x weekly - 2 sets - 10 reps - 5" hold - Supine Heel Slide  - 3 x daily - 7 x weekly - 1 sets - 10 reps - Seated Heel Toe Raises  - 3 x daily - 7 x weekly - 1 sets - 10 reps - Seated Heel Slide  - 3 x daily - 7 x weekly - 1 sets - 10 reps - Seated March  - 3 x daily - 7 x weekly - 2 sets - 10 reps - Seated Long Arc Quad  - 3 x daily - 7 x weekly - 1 sets - 10 reps  06/12/23  Supine Knee Extension Strengthening  - 2 x daily - 7 x weekly - 1 sets - 10 reps - 3-5" hold - Sit to Stand Without Arm Support  - 2 x daily - 7 x weekly - 1 sets - 10 reps - Heel Raises with Counter Support  - 2 x daily - 7 x weekly - 1 sets - 10 reps - 3-5 " hold  06/28/23 - Mini Squat with Counter Support  - 2 x daily - 7 x weekly - 1 sets - 10 reps - 5" hold CLINICAL IMPRESSION: Began with bike and ability to make full revolutions this session.  Worked on reducing UE assist with step ups,however still too weak/unstable to complete.  Added lunges and tandem stance to work on balance/stability.  Pt able to negotiate 4" steps reciprocally using 1HR, however more difficulty with descending.  Completed soft tissue to perimeter of knee with pt being hypersensitive.  Educated to include self massage at home to  lessen this.  AROM increased  to 120 degrees for flexion but still having difficulty with extension.  Patient will benefit from skilled physical therapy services to address these deficits to reduce pain and improve level of function with ADLs and functional mobility tasks.   OBJECTIVE IMPAIRMENTS: Abnormal gait, decreased activity tolerance, decreased balance, decreased endurance, decreased knowledge of use of DME, decreased mobility, difficulty walking, decreased ROM, decreased strength, hypomobility, increased edema, increased fascial restrictions, impaired perceived functional ability, impaired flexibility, postural dysfunction, and pain.   ACTIVITY LIMITATIONS: carrying, lifting, bending, sitting, standing, squatting, sleeping, stairs, transfers, bed mobility, locomotion level, and caring for others  PARTICIPATION LIMITATIONS: meal prep, cleaning, laundry, driving, shopping, community activity, and yard work  [facility] POTENTIAL: Good  CLINICAL DECISION MAKING: Stable/uncomplicated  EVALUATION COMPLEXITY: Low   GOALS: Goals reviewed with patient? No  SHORT TERM GOALS: Target date: 06/20/2023 patient will be independent with initial HEP  Baseline: Goal status: MET  2.  Patient will self report 30% improvement to improve tolerance for functional activity  Baseline:  Goal status: MET  3.  Patient will improve right knee mobility to -10 to 90 degrees to promote normal knee mechanics with ambulation Baseline:  Goal status: MET   LONG TERM GOALS: Target date: 07/11/2023  Patient will be independent in self management strategies to improve quality of life and functional outcomes.  Baseline:  Goal status: MET  2.  Patient will self report 50% improvement to improve tolerance for functional activity  Baseline:  Goal status: MET  3.  Patient will increase right  leg MMTs to 4+ to 5/5 without pain to promote return to ambulation community distances with minimal deviation.     Baseline: see above Goal status: IN PROGRESS  4.  Patient will increased right knee mobility to -5 to 120 to promote normal navigation of steps; step over step pattern  Baseline: see above Goal status: IN PROGRESS  5.  Patient will improve 5 times sit to stand score from 29.96 sec to 20 sec without use of arms to assist to demonstrate improved functional mobility and increased lower extremity strength.  Baseline:  Goal status: IN PROGRESS  6.  Patient will improve FOTO score by 20 points   Baseline: 23 Goal status: IN PROGRESS   PLAN:  PT FREQUENCY: 2x/week  PT DURATION: 6 weeks  PLANNED INTERVENTIONS: Therapeutic exercises, Therapeutic activity, Neuromuscular re-education, Balance training, Gait training, Patient/Family education, Joint manipulation, Joint mobilization, Stair training, Orthotic/Fit training, DME instructions, Aquatic Therapy, Dry Needling, Electrical stimulation, Spinal manipulation, Spinal mobilization, Cryotherapy, Moist heat, Compression bandaging, scar mobilization, Splintting, Taping, Traction, Ultrasound, Ionotophoresis 4mg /ml Dexamethasone, and Manual therapy   PLAN FOR NEXT SESSION:  progress knee mobility and strength as able; gait and balance training  Lurena Nida, PTA/CLT [facility] Ph: [phone]   Lurena Nida, PTA 07/04/2023, 12:05 PM

## 2023-07-08 ENCOUNTER — Ambulatory Visit (HOSPITAL_COMMUNITY): Payer: 59 | Admitting: Physical Therapy

## 2023-07-08 DIAGNOSIS — M25661 Stiffness of right knee, not elsewhere classified: Secondary | ICD-10-CM

## 2023-07-08 DIAGNOSIS — R262 Difficulty in walking, not elsewhere classified: Secondary | ICD-10-CM | POA: Diagnosis not present

## 2023-07-08 DIAGNOSIS — M25561 Pain in right knee: Secondary | ICD-10-CM | POA: Diagnosis not present

## 2023-07-08 NOTE — Therapy (Signed)
OUTPATIENT PHYSICAL THERAPY TREATMENT   Patient Name: LETTYE Mcguire Mcguire: [id number] DOB:February 06, 1946, 77 y.o., female Today's Date: 07/08/2023  END OF SESSION:   PT End of Session - 07/08/23 1038     Visit Number 8    Number of Visits 12    Date for PT Re-Evaluation 07/11/23    Authorization Type UHC; no vl    PT Start Time 1036    PT Stop Time 1115    PT Time Calculation (min) 39 min    Activity Tolerance Patient tolerated treatment well    Behavior During Therapy [facility] for tasks assessed/performed                 Past Medical History:  Diagnosis Date   Acute pain of right knee 01/30/2011   Qualifier: Diagnosis of  By: Lodema Hong MD, Bassheva     Anemia    Arthritis    Carpal tunnel syndrome, bilateral    COPD (chronic obstructive pulmonary disease) (HCC)    Diverticulosis dx 2011   GERD (gastroesophageal reflux disease)    Headache    Hypertension 06/02/2018   Hypothyroidism    Low back pain    Obesity    Rheumatoid arthritis(714.0)    Past Surgical History:  Procedure Laterality Date   bilateral foot surgery     COLONOSCOPY  2011   Dr. Darrick Penna: 3mm sessile polyp (tubular adenoma), pancolonic diverticulosis   COLONOSCOPY N/A 09/26/2017   diverticulosis, redundant colon   COLONOSCOPY WITH PROPOFOL N/A 05/01/2022   Procedure: COLONOSCOPY WITH PROPOFOL;  Surgeon: Lanelle Bal, DO;  Location: [facility];  Service: Endoscopy;  Laterality: N/A;  11:30am   ESOPHAGOGASTRODUODENOSCOPY N/A 09/16/2017   Dr. Darrick Penna: multiple gastric polyps, (fundic gland), mild gastritis, path with duodenal mucosa with focal villous blunting and surface erosions, moderate chronic gastritis with intestinal metaplasia and reactive changes. no H.pylori, dysplasia, or malignancy   FLEXIBLE SIGMOIDOSCOPY N/A 09/16/2017   Procedure: FLEXIBLE SIGMOIDOSCOPY;  Surgeon: West Bali, MD;  Location: [facility];  Service: Endoscopy;  Laterality: N/A;   HEMORRHOID SURGERY  1980   KNEE  ARTHROSCOPY WITH LATERAL MENISECTOMY Right 10/16/2018   Procedure: KNEE ARTHROSCOPY WITH LATERAL MENISCECTOMY;  Surgeon: Vickki Hearing, MD;  Location: [facility];  Service: Orthopedics;  Laterality: Right;   OOPHORECTOMY Right    roophorectomy for benign disease  02/2010   Dr. Emelda Fear   tonsillectomy and adenoidctomy in childhood     TOTAL KNEE ARTHROPLASTY Right 05/28/2023   Procedure: TOTAL KNEE ARTHROPLASTY;  Surgeon: Vickki Hearing, MD;  Location: [facility];  Service: Orthopedics;  Laterality: Right;   TUBAL LIGATION  1977   Patient Active Problem List   Diagnosis Date Noted   S/P total knee replacement, right 05/28/23 06/07/2023   Arthritis of knee, right 05/28/2023   Abnormal CT scan, chest 04/21/2023   Bilateral chronic knee pain 04/19/2023   Annual visit for general adult medical examination with abnormal findings 04/19/2023   Mixed simple and mucopurulent chronic bronchitis (HCC) 03/01/2023   Dysphagia 03/01/2023   Hoarseness of voice 03/01/2023   COPD with acute exacerbation (HCC) 01/30/2023   Positive colorectal cancer screening using Cologuard test 03/27/2022   Herpes zoster 03/15/2022   Reduced vision 10/26/2021   Nausea 09/04/2021   COPD (chronic obstructive pulmonary disease) (HCC) 04/12/2021   Knee pain, bilateral 05/10/2020   Allergic rhinitis 05/10/2020   S/P arthroscopy of right knee 10/16/18 10/23/2018   Meniscus, lateral, derangement, right    Primary osteoarthritis  of right knee    Back spasm 08/11/2018   Essential hypertension 06/02/2018   Anemia    Normocytic anemia, not due to blood loss 07/26/2017   Vitamin B12 deficiency 07/26/2017   Shoulder pain, left 05/22/2017   Tubular adenoma of colon 07/17/2015   H/O nicotine dependence 07/17/2015   Unsteady gait 07/05/2014   Back pain with right-sided radiculopathy 06/07/2014   Osteoporosis 04/21/2014   IGT (impaired glucose tolerance) 09/20/2013   Adult hypothyroidism 02/02/2011   Hyperlipidemia  05/03/2010   SHOULDER PAIN, LEFT 05/03/2010   Vitamin D deficiency 01/10/2010   OSTEOARTHRITIS, KNEES, BILATERAL 01/10/2010   Fatigue 01/10/2010   Nephrolithiasis, left 01/10/2010   HAND PAIN, BILATERAL 11/17/2008   VAGINITIS, ATROPHIC 10/05/2008   CARPAL TUNNEL SYNDROME, BILATERAL 09/07/2008   Rheumatoid arthritis (HCC) 09/07/2008   Low back pain with radiation 09/07/2008    PCP: Kerri Perches, MD  REFERRING PROVIDER: Vickki Hearing, MD  REFERRING DIAG: M17.11 (ICD-10-CM) - Unilateral primary osteoarthritis, right knee  THERAPY DIAG:  Acute pain of right knee  Stiffness of right knee, not elsewhere classified  Difficulty in walking, not elsewhere classified  Rationale for Evaluation and Treatment: Rehabilitation  ONSET DATE: 05/28/23  SUBJECTIVE STATEMENT:  Pt states that she is still a little sore.  States she's been doing the self massage and it is getting less sensitive.    PERTINENT HISTORY: Needs left TKA  PAIN:  Are you having pain? Yes: NPRS scale: 3/10 Pain location: right knee Pain description: sore, throbbing Aggravating factors: getting in and out of the care Relieving factors: medication  PRECAUTIONS: None  WEIGHT BEARING RESTRICTIONS: No  FALLS:  Has patient fallen in last 6 months? No  LIVING ENVIRONMENT: Lives with: lives alone; granddaughter checks on her daily Lives in: House/apartment Stairs: No Has following equipment at home: Single point cane, shower chair, bed side commode, Ramped entry, and None  OCCUPATION: retired  PLOF: Independent  PATIENT GOALS: get back to doing everything  NEXT MD VISIT: 06/11/23  OBJECTIVE:   DIAGNOSTIC FINDINGS:    CLINICAL DATA:  Status post total arthroplasty   EXAM: PORTABLE RIGHT KNEE - 1-2 VIEW   COMPARISON:  04/26/2023   FINDINGS: Status post right knee total arthroplasty with expected overlying postoperative change. No perihardware fracture or component malpositioning.    IMPRESSION: Status post right knee total arthroplasty with expected overlying postoperative change. No perihardware fracture or component malpositioning.   PATIENT SURVEYS: FOTO 23  COGNITION: Overall cognitive status: Within functional limits for tasks assessed     SENSATION: Hands sometimes  EDEMA:  Right knee normal for this time s/p    POSTURE: flexed trunk   PALPATION: General soreness normal for this time s/p  LOWER EXTREMITY ROM:  Active ROM Right eval Right 6/20 Right  7/9 Right 7/12 Right 7/19 Right 7/25  Hip flexion        Hip extension        Hip abduction        Hip adduction        Hip internal rotation        Hip external rotation        Knee flexion 72 sitting Supine : 90 100 105 110 120  Knee extension -27 supine -15 10 8 6 5   Ankle dorsiflexion        Ankle plantarflexion        Ankle inversion        Ankle eversion         (  Blank rows = not tested)  LOWER EXTREMITY MMT:  MMT Right eval Left eval  Hip flexion 3- 4  Hip extension    Hip abduction    Hip adduction    Hip internal rotation    Hip external rotation    Knee flexion    Knee extension 2- 4-  Ankle dorsiflexion 4 4+  Ankle plantarflexion    Ankle inversion    Ankle eversion     (Blank rows = not tested)  FUNCTIONAL TESTS:  5 times sit to stand: 29.96 using hands to push up to standing 2 minute walk test: 06/21/23: with walker:  212 FT    GAIT: Distance walked: 50 ft in clinic Assistive device utilized: Walker - 2 wheeled Level of assistance: SBA Comments: initially was using a standard bariatric walker; switched to RW in clinic and instructed on purchasing a smaller more appropriate walker for ambulation with her and her granddaughter; gave information on "[employer]"   TODAY'S TREATMENT:                                                                                                                              DATE:  07/08/23 Bike seat 4 full revolutions  5 minutes Standing:  knee flexion stretch with 12" step 10X10"  Hamstring stretch with 12" step 2X30"  Slant board stretch 2X30"  Forward step ups 4" 1 HHA 10X  Lateral step ups 4" 1 HHA 10X  Forward step downs 4" 1 HHA 10X  Forward lunges 10X bilaterally without UE assist onto 4" step Supine SAQ 10X5" Soft tissue massage with elevation, MFR to knee  07/04/23 Bike seat 4 full revolutions 5 minutes Standing:  knee flexion stretch with 12" step 10X10"  Hamstring stretch with 12" step 2X30"  Slant board stretch 2X30"  Forward step ups 4" 1 HHA 10X  Lateral step ups 4" 1 HHA 10X  Forward step downs 4" 1 HHA 10X  Forward lunges 10X bilaterally with UE assist onto 4" step  Stair negotiation 4" with 1 HR reciprocally up/down 1X Supine AROM 5-120 Soft tissue, MFR to knee; self massage instructions  07/02/23 Rocker board x 2 minutes Heel raises 15 Toe raise from slant x 5  Terminal knee extension x 10 Knee flexion x 10  Slant board x 30" x 2  Hamstring stretch on 12" stool 30" hold x 2 Lunge on 12" stool to improve flexion 30" x 2  Minisquat x 10 Lateral step up 4" x 10 6" step up x 10 Sitting sit to stand x 15 Bike  x 5 minutes  Supine : ROM  5-115 SAQ x 15  Bridge x 10   06/28/23 Rocker board x 2 minutes Heel raises 15 Terminal knee extension x 10 Knee flexion x 10  Hamstring stretch on 12" stool 30" hold x 2 Lunge on 12" stool to improve flexion 30" x 2  Minisquat x 10 6" step up x  10 Sitting sit to stand x 10 LAQ x 10   Bike  x 5 minutes   06/21/23 Standing: Heel raises x 10 Mini squat x 10 Knee flexion x 10 Standing terminal extension x 10 4" step up x 10  Sititng: LAQ x 10  Sit to stand x 10  Supine: Quad set x 10  Heel slide x 10  SAQ x 10  SLR x 10  Bridge x 10  Hip abduction x 10  Bike x 5 minutes for improved ROM  06/18/23:  adjusted walker Bike rocking x 6' Heel raise x 10 Knee flexion x 10 Squat x 10  Step up 4"  x 10 Long arc quad x 10  with bending as far as possible x 15  SAQ x 15 Heel slide x 5 SLR x 5 x 2  Bridge x 10  Active hamstring stretch x 3 Quad set x 10  Hip abduction x 10    06/12/2023 Gt with RW x 57ft:  Pt states that she is fatigued.  Urged pt to walk every 2 hours for at least 2-5 minutes  Sitting:  LAQ going into flexion as far as possible x 10  Supine: quad set x 10                Heel slide x 10               SLR x 5 due to c/o increased back pain               SAQ x 10                Bridge x 5  Standing heel raises x 10 Manual to decrease pain and edema Patella mob to improve ROM                  05/30/23 physical therapy evaluation and HEP instruction    PATIENT EDUCATION:  Education details: Patient educated on exam findings, POC, scope of PT, HEP. Person educated: Patient Education method: Explanation, Demonstration, and Handouts Education comprehension: verbalized understanding, returned demonstration, verbal cues required, and tactile cues required  HOME EXERCISE PROGRAM: Access Code: [email or web address] URL: [email or web address] Date: 05/30/2023 Prepared by: [facility]  Exercises - Supine Quad Set  - 3 x daily - 7 x weekly - 2 sets - 10 reps - 5" hold - Supine Heel Slide  - 3 x daily - 7 x weekly - 1 sets - 10 reps - Seated Heel Toe Raises  - 3 x daily - 7 x weekly - 1 sets - 10 reps - Seated Heel Slide  - 3 x daily - 7 x weekly - 1 sets - 10 reps - Seated March  - 3 x daily - 7 x weekly - 2 sets - 10 reps - Seated Long Arc Quad  - 3 x daily - 7 x weekly - 1 sets - 10 reps  06/12/23  Supine Knee Extension Strengthening  - 2 x daily - 7 x weekly - 1 sets - 10 reps - 3-5" hold - Sit to Stand Without Arm Support  - 2 x daily - 7 x weekly - 1 sets - 10 reps - Heel Raises with Counter Support  - 2 x daily - 7 x weekly - 1 sets - 10 reps - 3-5 " hold  06/28/23 - Mini Squat with Counter Support  - 2 x daily - 7 x weekly - 1 sets - 10 reps -  5" hold  CLINICAL  IMPRESSION: Continued focus on improving AROM and strength of Rt LE.  Difficulty making full revolutions initially on bike but after a few rocks making full revolutions.   Pt completed lunges without UE but all other activities required at least 1 UE assist.  Continued with soft tissue to perimeter of knee with less sensitivity noted today.   AROM remains 5-120 degrees.  Patient will benefit from skilled physical therapy services to address these deficits to reduce pain and improve level of function with ADLs and functional mobility tasks.   OBJECTIVE IMPAIRMENTS: Abnormal gait, decreased activity tolerance, decreased balance, decreased endurance, decreased knowledge of use of DME, decreased mobility, difficulty walking, decreased ROM, decreased strength, hypomobility, increased edema, increased fascial restrictions, impaired perceived functional ability, impaired flexibility, postural dysfunction, and pain.   ACTIVITY LIMITATIONS: carrying, lifting, bending, sitting, standing, squatting, sleeping, stairs, transfers, bed mobility, locomotion level, and caring for others  PARTICIPATION LIMITATIONS: meal prep, cleaning, laundry, driving, shopping, community activity, and yard work  [facility] POTENTIAL: Good  CLINICAL DECISION MAKING: Stable/uncomplicated  EVALUATION COMPLEXITY: Low   GOALS: Goals reviewed with patient? No  SHORT TERM GOALS: Target date: 06/20/2023 patient will be independent with initial HEP  Baseline: Goal status: MET  2.  Patient will self report 30% improvement to improve tolerance for functional activity  Baseline:  Goal status: MET  3.  Patient will improve right knee mobility to -10 to 90 degrees to promote normal knee mechanics with ambulation Baseline:  Goal status: MET   LONG TERM GOALS: Target date: 07/11/2023  Patient will be independent in self management strategies to improve quality of life and functional outcomes.  Baseline:  Goal status: MET  2.   Patient will self report 50% improvement to improve tolerance for functional activity  Baseline:  Goal status: MET  3.  Patient will increase right  leg MMTs to 4+ to 5/5 without pain to promote return to ambulation community distances with minimal deviation.    Baseline: see above Goal status: IN PROGRESS  4.  Patient will increased right knee mobility to -5 to 120 to promote normal navigation of steps; step over step pattern  Baseline: see above Goal status: IN PROGRESS  5.  Patient will improve 5 times sit to stand score from 29.96 sec to 20 sec without use of arms to assist to demonstrate improved functional mobility and increased lower extremity strength.  Baseline:  Goal status: IN PROGRESS  6.  Patient will improve FOTO score by 20 points   Baseline: 23 Goal status: IN PROGRESS   PLAN:  PT FREQUENCY: 2x/week  PT DURATION: 6 weeks  PLANNED INTERVENTIONS: Therapeutic exercises, Therapeutic activity, Neuromuscular re-education, Balance training, Gait training, Patient/Family education, Joint manipulation, Joint mobilization, Stair training, Orthotic/Fit training, DME instructions, Aquatic Therapy, Dry Needling, Electrical stimulation, Spinal manipulation, Spinal mobilization, Cryotherapy, Moist heat, Compression bandaging, scar mobilization, Splintting, Taping, Traction, Ultrasound, Ionotophoresis 4mg /ml Dexamethasone, and Manual therapy   PLAN FOR NEXT SESSION:  progress knee mobility and strength as able; gait and balance training  Lurena Nida, PTA/CLT [facility] Ph: [phone]   Lurena Nida, PTA 07/08/2023, 10:50 AM

## 2023-07-10 ENCOUNTER — Ambulatory Visit (HOSPITAL_COMMUNITY): Payer: 59 | Admitting: Physical Therapy

## 2023-07-10 DIAGNOSIS — R262 Difficulty in walking, not elsewhere classified: Secondary | ICD-10-CM | POA: Diagnosis not present

## 2023-07-10 DIAGNOSIS — M25561 Pain in right knee: Secondary | ICD-10-CM | POA: Diagnosis not present

## 2023-07-10 DIAGNOSIS — M25661 Stiffness of right knee, not elsewhere classified: Secondary | ICD-10-CM

## 2023-07-10 NOTE — Therapy (Signed)
OUTPATIENT PHYSICAL THERAPY TREATMENT   Patient Name: Bianca Mcguire MRN: [id number] DOB:04-Sep-1946, 77 y.o., female Today's Date: 07/10/2023  END OF SESSION:   PT End of Session - 07/10/23 0940     Visit Number 9    Number of Visits 12    Date for PT Re-Evaluation 07/11/23    Authorization Type U[employer]; no vl    PT Start Time 0900    PT Stop Time 0940    PT Time Calculation (min) 40 min    Activity Tolerance Patient tolerated treatment well    Behavior During Therapy [facility] for tasks assessed/performed                 Past Medical History:  Diagnosis Date   Acute pain of right knee 01/30/2011   Qualifier: Diagnosis of  By: Lodema Hong MD, Whitni     Anemia    Arthritis    Carpal tunnel syndrome, bilateral    COPD (chronic obstructive pulmonary disease) (HCC)    Diverticulosis dx 2011   GERD (gastroesophageal reflux disease)    Headache    Hypertension 06/02/2018   Hypothyroidism    Low back pain    Obesity    Rheumatoid arthritis(714.0)    Past Surgical History:  Procedure Laterality Date   bilateral foot surgery     COLONOSCOPY  2011   Dr. Darrick Penna: 3mm sessile polyp (tubular adenoma), pancolonic diverticulosis   COLONOSCOPY N/A 09/26/2017   diverticulosis, redundant colon   COLONOSCOPY WITH PROPOFOL N/A 05/01/2022   Procedure: COLONOSCOPY WITH PROPOFOL;  Surgeon: Lanelle Bal, DO;  Location: [facility];  Service: Endoscopy;  Laterality: N/A;  11:30am   ESOPHAGOGASTRODUODENOSCOPY N/A 09/16/2017   Dr. Darrick Penna: multiple gastric polyps, (fundic gland), mild gastritis, path with duodenal mucosa with focal villous blunting and surface erosions, moderate chronic gastritis with intestinal metaplasia and reactive changes. no H.pylori, dysplasia, or malignancy   FLEXIBLE SIGMOIDOSCOPY N/A 09/16/2017   Procedure: FLEXIBLE SIGMOIDOSCOPY;  Surgeon: West Bali, MD;  Location: [facility];  Service: Endoscopy;  Laterality: N/A;   HEMORRHOID SURGERY  1980   KNEE  ARTHROSCOPY WITH LATERAL MENISECTOMY Right 10/16/2018   Procedure: KNEE ARTHROSCOPY WITH LATERAL MENISCECTOMY;  Surgeon: Vickki Hearing, MD;  Location: [facility];  Service: Orthopedics;  Laterality: Right;   OOPHORECTOMY Right    roophorectomy for benign disease  02/2010   Dr. Emelda Fear   tonsillectomy and adenoidctomy in childhood     TOTAL KNEE ARTHROPLASTY Right 05/28/2023   Procedure: TOTAL KNEE ARTHROPLASTY;  Surgeon: Vickki Hearing, MD;  Location: [facility];  Service: Orthopedics;  Laterality: Right;   TUBAL LIGATION  1977   Patient Active Problem List   Diagnosis Date Noted   S/P total knee replacement, right 05/28/23 06/07/2023   Arthritis of knee, right 05/28/2023   Abnormal CT scan, chest 04/21/2023   Bilateral chronic knee pain 04/19/2023   Annual visit for general adult medical examination with abnormal findings 04/19/2023   Mixed simple and mucopurulent chronic bronchitis (HCC) 03/01/2023   Dysphagia 03/01/2023   Hoarseness of voice 03/01/2023   COPD with acute exacerbation (HCC) 01/30/2023   Positive colorectal cancer screening using Cologuard test 03/27/2022   Herpes zoster 03/15/2022   Reduced vision 10/26/2021   Nausea 09/04/2021   COPD (chronic obstructive pulmonary disease) (HCC) 04/12/2021   Knee pain, bilateral 05/10/2020   Allergic rhinitis 05/10/2020   S/P arthroscopy of right knee 10/16/18 10/23/2018   Meniscus, lateral, derangement, right    Primary osteoarthritis  of right knee    Back spasm 08/11/2018   Essential hypertension 06/02/2018   Anemia    Normocytic anemia, not due to blood loss 07/26/2017   Vitamin B12 deficiency 07/26/2017   Shoulder pain, left 05/22/2017   Tubular adenoma of colon 07/17/2015   H/O nicotine dependence 07/17/2015   Unsteady gait 07/05/2014   Back pain with right-sided radiculopathy 06/07/2014   Osteoporosis 04/21/2014   IGT (impaired glucose tolerance) 09/20/2013   Adult hypothyroidism 02/02/2011   Hyperlipidemia  05/03/2010   SHOULDER PAIN, LEFT 05/03/2010   Vitamin D deficiency 01/10/2010   OSTEOARTHRITIS, KNEES, BILATERAL 01/10/2010   Fatigue 01/10/2010   Nephrolithiasis, left 01/10/2010   HAND PAIN, BILATERAL 11/17/2008   VAGINITIS, ATROPHIC 10/05/2008   CARPAL TUNNEL SYNDROME, BILATERAL 09/07/2008   Rheumatoid arthritis (HCC) 09/07/2008   Low back pain with radiation 09/07/2008    PCP: Kerri Perches, MD  REFERRING PROVIDER: Vickki Hearing, MD  REFERRING DIAG: M17.11 (ICD-10-CM) - Unilateral primary osteoarthritis, right knee  THERAPY DIAG:  Acute pain of right knee  Stiffness of right knee, not elsewhere classified  Difficulty in walking, not elsewhere classified  Rationale for Evaluation and Treatment: Rehabilitation  ONSET DATE: 05/28/23  SUBJECTIVE STATEMENT:  Pt states that she is feeling better.  PERTINENT HISTORY: Needs left TKA  PAIN:  Are you having pain? Yes: NPRS scale: 2/10 Pain location: right knee Pain description: sore, throbbing Aggravating factors: getting in and out of the care Relieving factors: medication  PRECAUTIONS: None  WEIGHT BEARING RESTRICTIONS: No  FALLS:  Has patient fallen in last 6 months? No  LIVING ENVIRONMENT: Lives with: lives alone; granddaughter checks on her daily Lives in: House/apartment Stairs: No Has following equipment at home: Single point cane, shower chair, bed side commode, Ramped entry, and None  OCCUPATION: retired  PLOF: Independent  PATIENT GOALS: get back to doing everything  NEXT MD VISIT: 06/11/23  OBJECTIVE:   DIAGNOSTIC FINDINGS:    CLINICAL DATA:  Status post total arthroplasty   EXAM: PORTABLE RIGHT KNEE - 1-2 VIEW   COMPARISON:  04/26/2023   FINDINGS: Status post right knee total arthroplasty with expected overlying postoperative change. No perihardware fracture or component malpositioning.   IMPRESSION: Status post right knee total arthroplasty with expected  overlying postoperative change. No perihardware fracture or component malpositioning.   PATIENT SURVEYS: FOTO 23  COGNITION: Overall cognitive status: Within functional limits for tasks assessed     SENSATION: Hands sometimes  EDEMA:  Right knee normal for this time s/p    POSTURE: flexed trunk   PALPATION: General soreness normal for this time s/p  LOWER EXTREMITY ROM:  Active ROM Right eval Right 6/20 Right  7/9 Right 7/12 Right 7/19 Right 7/25 Right 7/31  Hip flexion         Hip extension         Hip abduction         Hip adduction         Hip internal rotation         Hip external rotation         Knee flexion 72 sitting Supine : 90 100 105 110 120 122  Knee extension -27 supine -15 10 8 6 5 3   Ankle dorsiflexion         Ankle plantarflexion         Ankle inversion         Ankle eversion          (Blank rows =  not tested)  LOWER EXTREMITY MMT:  MMT Right eval Left eval  Hip flexion 3- 4  Hip extension    Hip abduction    Hip adduction    Hip internal rotation    Hip external rotation    Knee flexion    Knee extension 2- 4-  Ankle dorsiflexion 4 4+  Ankle plantarflexion    Ankle inversion    Ankle eversion     (Blank rows = not tested)  FUNCTIONAL TESTS:  5 times sit to stand: 29.96 using hands to push up to standing 2 minute walk test: 06/21/23: with walker:  212 FT    GAIT: Distance walked: 50 ft in clinic Assistive device utilized: Walker - 2 wheeled Level of assistance: SBA Comments: initially was using a standard bariatric walker; switched to RW in clinic and instructed on purchasing a smaller more appropriate walker for ambulation with her and her granddaughter; gave information on "[employer]"   TODAY'S TREATMENT:                                                                                                                              DATE:  07/10/23 Heel raise x 10  Standing knee flexion x 10 Terminal extension x  10 Lunging onto 12" stool to gain ROM x 3 Hamstring stretch on 12" stool  Step up onto 4" step x 10 Lateral step up 4" x 10  Slant board stretch x 3  Quad set 10 x10 SAQ 3# x 10  Heelslide  x 5  Manual to decrease scar adhesions and improve patellar mobility.   07/08/23 Bike seat 4 full revolutions 5 minutes Standing:  knee flexion stretch with 12" step 10X10"  Hamstring stretch with 12" step 2X30"  Slant board stretch 2X30"  Forward step ups 4" 1 HHA 10X  Lateral step ups 4" 1 HHA 10X  Forward step downs 4" 1 HHA 10X  Forward lunges 10X bilaterally without UE assist onto 4" step Supine SAQ 10X5" Soft tissue massage with elevation, MFR to knee  07/04/23 Bike seat 4 full revolutions 5 minutes Standing:  knee flexion stretch with 12" step 10X10"  Hamstring stretch with 12" step 2X30"  Slant board stretch 2X30"  Forward step ups 4" 1 HHA 10X  Lateral step ups 4" 1 HHA 10X  Forward step downs 4" 1 HHA 10X  Forward lunges 10X bilaterally with UE assist onto 4" step  Stair negotiation 4" with 1 HR reciprocally up/down 1X Supine AROM 5-120 Soft tissue, MFR to knee; self massage instructions  07/02/23 Rocker board x 2 minutes Heel raises 15 Toe raise from slant x 5  Terminal knee extension x 10 Knee flexion x 10  Slant board x 30" x 2  Hamstring stretch on 12" stool 30" hold x 2 Lunge on 12" stool to improve flexion 30" x 2  Minisquat x 10 Lateral step up 4" x 10 6" step up x 10  Sitting sit to stand x 15 Bike  x 5 minutes  Supine : ROM  5-115 SAQ x 15  Bridge x 10   06/28/23 Rocker board x 2 minutes Heel raises 15 Terminal knee extension x 10 Knee flexion x 10  Hamstring stretch on 12" stool 30" hold x 2 Lunge on 12" stool to improve flexion 30" x 2  Minisquat x 10 6" step up x 10 Sitting sit to stand x 10 LAQ x 10   Bike  x 5 minutes   06/21/23 Standing: Heel raises x 10 Mini squat x 10 Knee flexion x 10 Standing terminal extension x 10 4" step up x 10   Sititng: LAQ x 10  Sit to stand x 10  Supine: Quad set x 10  Heel slide x 10  SAQ x 10  SLR x 10  Bridge x 10  Hip abduction x 10  Bike x 5 minutes for improved ROM   PATIENT EDUCATION:  Education details: Patient educated on exam findings, POC, scope of PT, HEP. Person educated: Patient Education method: Explanation, Demonstration, and Handouts Education comprehension: verbalized understanding, returned demonstration, verbal cues required, and tactile cues required  HOME EXERCISE PROGRAM: Access Code: [email or web address] URL: [email or web address] Date: 05/30/2023 Prepared by: [facility]  Exercises - Supine Quad Set  - 3 x daily - 7 x weekly - 2 sets - 10 reps - 5" hold - Supine Heel Slide  - 3 x daily - 7 x weekly - 1 sets - 10 reps - Seated Heel Toe Raises  - 3 x daily - 7 x weekly - 1 sets - 10 reps - Seated Heel Slide  - 3 x daily - 7 x weekly - 1 sets - 10 reps - Seated March  - 3 x daily - 7 x weekly - 2 sets - 10 reps - Seated Long Arc Quad  - 3 x daily - 7 x weekly - 1 sets - 10 reps  06/12/23  Supine Knee Extension Strengthening  - 2 x daily - 7 x weekly - 1 sets - 10 reps - 3-5" hold - Sit to Stand Without Arm Support  - 2 x daily - 7 x weekly - 1 sets - 10 reps - Heel Raises with Counter Support  - 2 x daily - 7 x weekly - 1 sets - 10 reps - 3-5 " hold  06/28/23 - Mini Squat with Counter Support  - 2 x daily - 7 x weekly - 1 sets - 10 reps - 5" hold  CLINICAL IMPRESSION:  Pt now walking without an assistive device ROM improved to 3-122.  Pt has decreased knee mobility and noted scaring under incisions; manual completed to improve both.    Patient will benefit from skilled physical therapy services to address these deficits to reduce pain and improve level of function with ADLs and functional mobility tasks.   OBJECTIVE IMPAIRMENTS: Abnormal gait, decreased activity tolerance, decreased balance, decreased endurance, decreased knowledge of use of DME, decreased  mobility, difficulty walking, decreased ROM, decreased strength, hypomobility, increased edema, increased fascial restrictions, impaired perceived functional ability, impaired flexibility, postural dysfunction, and pain.   ACTIVITY LIMITATIONS: carrying, lifting, bending, sitting, standing, squatting, sleeping, stairs, transfers, bed mobility, locomotion level, and caring for others  PARTICIPATION LIMITATIONS: meal prep, cleaning, laundry, driving, shopping, community activity, and yard work  [facility] POTENTIAL: Good  CLINICAL DECISION MAKING: Stable/uncomplicated  EVALUATION COMPLEXITY: Low   GOALS: Goals reviewed with patient? No  SHORT TERM GOALS:  Target date: 06/20/2023 patient will be independent with initial HEP  Baseline: Goal status: MET  2.  Patient will self report 30% improvement to improve tolerance for functional activity  Baseline:  Goal status: MET  3.  Patient will improve right knee mobility to -10 to 90 degrees to promote normal knee mechanics with ambulation Baseline:  Goal status: MET   LONG TERM GOALS: Target date: 07/11/2023  Patient will be independent in self management strategies to improve quality of life and functional outcomes.  Baseline:  Goal status: MET  2.  Patient will self report 50% improvement to improve tolerance for functional activity  Baseline:  Goal status: MET  3.  Patient will increase right  leg MMTs to 4+ to 5/5 without pain to promote return to ambulation community distances with minimal deviation.    Baseline: see above Goal status: IN PROGRESS  4.  Patient will increased right knee mobility to -5 to 120 to promote normal navigation of steps; step over step pattern  Baseline: see above Goal status: met   5.  Patient will improve 5 times sit to stand score from 29.96 sec to 20 sec without use of arms to assist to demonstrate improved functional mobility and increased lower extremity strength.  Baseline:  Goal status:  IN PROGRESS  6.  Patient will improve FOTO score by 20 points   Baseline: 23 Goal status: IN PROGRESS   PLAN:  PT FREQUENCY: 2x/week  PT DURATION: 6 weeks  PLANNED INTERVENTIONS: Therapeutic exercises, Therapeutic activity, Neuromuscular re-education, Balance training, Gait training, Patient/Family education, Joint manipulation, Joint mobilization, Stair training, Orthotic/Fit training, DME instructions, Aquatic Therapy, Dry Needling, Electrical stimulation, Spinal manipulation, Spinal mobilization, Cryotherapy, Moist heat, Compression bandaging, scar mobilization, Splintting, Taping, Traction, Ultrasound, Ionotophoresis 4mg /ml Dexamethasone, and Manual therapy   PLAN FOR NEXT SESSION:  reassess  Virgina Organ, PT CLT [phone]  07/10/2023, 9:42 AM

## 2023-07-15 ENCOUNTER — Encounter (HOSPITAL_COMMUNITY): Payer: Self-pay | Admitting: Physical Therapy

## 2023-07-15 ENCOUNTER — Ambulatory Visit (HOSPITAL_COMMUNITY): Payer: 59 | Attending: Orthopedic Surgery | Admitting: Physical Therapy

## 2023-07-15 DIAGNOSIS — M25661 Stiffness of right knee, not elsewhere classified: Secondary | ICD-10-CM | POA: Insufficient documentation

## 2023-07-15 DIAGNOSIS — M25561 Pain in right knee: Secondary | ICD-10-CM | POA: Insufficient documentation

## 2023-07-15 DIAGNOSIS — R262 Difficulty in walking, not elsewhere classified: Secondary | ICD-10-CM | POA: Diagnosis not present

## 2023-07-15 NOTE — Therapy (Signed)
OUTPATIENT PHYSICAL THERAPY TREATMENT   Patient Name: Bianca Mcguire MRN: [id number] DOB:03-07-1946, 77 y.o., female Today's Date: 07/15/2023  PHYSICAL THERAPY DISCHARGE SUMMARY  Visits from Start of Care: 10  Current functional level related to goals / functional outcomes: See below   Remaining deficits: See below   Education / Equipment: See below   Patient agrees to discharge. Patient goals were met. Patient is being discharged due to meeting the stated rehab goals.   Progress Note   Reporting Period 05/30/23 to 07/15/23   See note below for Objective Data and Assessment of Progress/Goals   END OF SESSION:   PT End of Session - 07/15/23 0946     Visit Number 10    Number of Visits 12    Date for PT Re-Evaluation 07/11/23    Authorization Type UHC; no vl    PT Start Time 0947    PT Stop Time 1008    PT Time Calculation (min) 21 min    Activity Tolerance Patient tolerated treatment well    Behavior During Therapy [facility] for tasks assessed/performed                  Past Medical History:  Diagnosis Date   Acute pain of right knee 01/30/2011   Qualifier: Diagnosis of  By: Lodema Hong MD, [place]     Anemia    Arthritis    Carpal tunnel syndrome, bilateral    COPD (chronic obstructive pulmonary disease) (HCC)    Diverticulosis dx 2011   GERD (gastroesophageal reflux disease)    Headache    Hypertension 06/02/2018   Hypothyroidism    Low back pain    Obesity    Rheumatoid arthritis(714.0)    Past Surgical History:  Procedure Laterality Date   bilateral foot surgery     COLONOSCOPY  2011   Dr. Darrick Penna: 3mm sessile polyp (tubular adenoma), pancolonic diverticulosis   COLONOSCOPY N/A 09/26/2017   diverticulosis, redundant colon   COLONOSCOPY WITH PROPOFOL N/A 05/01/2022   Procedure: COLONOSCOPY WITH PROPOFOL;  Surgeon: Lanelle Bal, DO;  Location: [facility];  Service: Endoscopy;  Laterality: N/A;  11:30am   ESOPHAGOGASTRODUODENOSCOPY N/A  09/16/2017   Dr. Darrick Penna: multiple gastric polyps, (fundic gland), mild gastritis, path with duodenal mucosa with focal villous blunting and surface erosions, moderate chronic gastritis with intestinal metaplasia and reactive changes. no H.pylori, dysplasia, or malignancy   FLEXIBLE SIGMOIDOSCOPY N/A 09/16/2017   Procedure: FLEXIBLE SIGMOIDOSCOPY;  Surgeon: West Bali, MD;  Location: [facility];  Service: Endoscopy;  Laterality: N/A;   HEMORRHOID SURGERY  1980   KNEE ARTHROSCOPY WITH LATERAL MENISECTOMY Right 10/16/2018   Procedure: KNEE ARTHROSCOPY WITH LATERAL MENISCECTOMY;  Surgeon: Vickki Hearing, MD;  Location: [facility];  Service: Orthopedics;  Laterality: Right;   OOPHORECTOMY Right    roophorectomy for benign disease  02/2010   Dr. Emelda Fear   tonsillectomy and adenoidctomy in childhood     TOTAL KNEE ARTHROPLASTY Right 05/28/2023   Procedure: TOTAL KNEE ARTHROPLASTY;  Surgeon: Vickki Hearing, MD;  Location: [facility];  Service: Orthopedics;  Laterality: Right;   TUBAL LIGATION  1977   Patient Active Problem List   Diagnosis Date Noted   S/P total knee replacement, right 05/28/23 06/07/2023   Arthritis of knee, right 05/28/2023   Abnormal CT scan, chest 04/21/2023   Bilateral chronic knee pain 04/19/2023   Annual visit for general adult medical examination with abnormal findings 04/19/2023   Mixed simple and mucopurulent chronic bronchitis (HCC)  03/01/2023   Dysphagia 03/01/2023   Hoarseness of voice 03/01/2023   COPD with acute exacerbation (HCC) 01/30/2023   Positive colorectal cancer screening using Cologuard test 03/27/2022   Herpes zoster 03/15/2022   Reduced vision 10/26/2021   Nausea 09/04/2021   COPD (chronic obstructive pulmonary disease) (HCC) 04/12/2021   Knee pain, bilateral 05/10/2020   Allergic rhinitis 05/10/2020   S/P arthroscopy of right knee 10/16/18 10/23/2018   Meniscus, lateral, derangement, right    Primary osteoarthritis of right knee    Back  spasm 08/11/2018   Essential hypertension 06/02/2018   Anemia    Normocytic anemia, not due to blood loss 07/26/2017   Vitamin B12 deficiency 07/26/2017   Shoulder pain, left 05/22/2017   Tubular adenoma of colon 07/17/2015   H/O nicotine dependence 07/17/2015   Unsteady gait 07/05/2014   Back pain with right-sided radiculopathy 06/07/2014   Osteoporosis 04/21/2014   IGT (impaired glucose tolerance) 09/20/2013   Adult hypothyroidism 02/02/2011   Hyperlipidemia 05/03/2010   SHOULDER PAIN, LEFT 05/03/2010   Vitamin D deficiency 01/10/2010   OSTEOARTHRITIS, KNEES, BILATERAL 01/10/2010   Fatigue 01/10/2010   Nephrolithiasis, left 01/10/2010   HAND PAIN, BILATERAL 11/17/2008   VAGINITIS, ATROPHIC 10/05/2008   CARPAL TUNNEL SYNDROME, BILATERAL 09/07/2008   Rheumatoid arthritis (HCC) 09/07/2008   Low back pain with radiation 09/07/2008    PCP: Kerri Perches, MD  REFERRING PROVIDER: Vickki Hearing, MD  REFERRING DIAG: M17.11 (ICD-10-CM) - Unilateral primary osteoarthritis, right knee  THERAPY DIAG:  Acute pain of right knee  Stiffness of right knee, not elsewhere classified  Difficulty in walking, not elsewhere classified  Rationale for Evaluation and Treatment: Rehabilitation  ONSET DATE: 05/28/23  SUBJECTIVE STATEMENT:  Pt states knee is doing good, still tender in knee. HEP going well. Almost 100% improvement since beginning PT. Remains limited with standing on one leg.   PERTINENT HISTORY: Needs left TKA  PAIN:  Are you having pain? Yes: NPRS scale: 0/10 Pain location: right knee Pain description: sore, throbbing Aggravating factors: getting in and out of the care Relieving factors: medication  PRECAUTIONS: None  WEIGHT BEARING RESTRICTIONS: No  FALLS:  Has patient fallen in last 6 months? No  LIVING ENVIRONMENT: Lives with: lives alone; granddaughter checks on her daily Lives in: House/apartment Stairs: No Has following equipment at home:  Single point cane, shower chair, bed side commode, Ramped entry, and None  OCCUPATION: retired  PLOF: Independent  PATIENT GOALS: get back to doing everything  NEXT MD VISIT: 06/11/23  OBJECTIVE:   DIAGNOSTIC FINDINGS:    CLINICAL DATA:  Status post total arthroplasty   EXAM: PORTABLE RIGHT KNEE - 1-2 VIEW   COMPARISON:  04/26/2023   FINDINGS: Status post right knee total arthroplasty with expected overlying postoperative change. No perihardware fracture or component malpositioning.   IMPRESSION: Status post right knee total arthroplasty with expected overlying postoperative change. No perihardware fracture or component malpositioning.   PATIENT SURVEYS: FOTO 23 07/15/23: 85% function   COGNITION: Overall cognitive status: Within functional limits for tasks assessed     SENSATION: Hands sometimes  EDEMA:  Right knee normal for this time s/p    POSTURE: flexed trunk   PALPATION: General soreness normal for this time s/p  LOWER EXTREMITY ROM:  Active ROM Right eval Right 6/20 Right  7/9 Right 7/12 Right 7/19 Right 7/25 Right 7/31 Right 07/15/23  Hip flexion          Hip extension  Hip abduction          Hip adduction          Hip internal rotation          Hip external rotation          Knee flexion 72 sitting Supine : 90 100 105 110 120 122 122  Knee extension -27 supine -15 10 8 6 5 3  lacking2  Ankle dorsiflexion          Ankle plantarflexion          Ankle inversion          Ankle eversion           (Blank rows = not tested)  LOWER EXTREMITY MMT:  MMT Right eval Left eval Right 07/15/23 Left 07/15/23  Hip flexion 3- 4 5 5   Hip extension      Hip abduction      Hip adduction      Hip internal rotation      Hip external rotation      Knee flexion   5 5  Knee extension 2- 4- 4+ 4+  Ankle dorsiflexion 4 4+ 5 5  Ankle plantarflexion      Ankle inversion      Ankle eversion       (Blank rows = not tested)  FUNCTIONAL TESTS:  5  times sit to stand: 29.96 using hands to push up to standing 2 minute walk test: 06/21/23: with walker:  212 FT    GAIT: Distance walked: 50 ft in clinic Assistive device utilized: Walker - 2 wheeled Level of assistance: SBA Comments: initially was using a standard bariatric walker; switched to RW in clinic and instructed on purchasing a smaller more appropriate walker for ambulation with her and her granddaughter; gave information on "[employer]"   TODAY'S TREATMENT:                                                                                                                              DATE:  07/15/23 Reassessment 5x STS: 11.80 seconds without UE support Stairs: RLE increased concentric compensation/difficulty ascending, descending bilaterally decreased eccentric strength and control with UE support   07/10/23 Heel raise x 10  Standing knee flexion x 10 Terminal extension x 10 Lunging onto 12" stool to gain ROM x 3 Hamstring stretch on 12" stool  Step up onto 4" step x 10 Lateral step up 4" x 10  Slant board stretch x 3  Quad set 10 x10 SAQ 3# x 10  Heelslide  x 5  Manual to decrease scar adhesions and improve patellar mobility.   07/08/23 Bike seat 4 full revolutions 5 minutes Standing:  knee flexion stretch with 12" step 10X10"  Hamstring stretch with 12" step 2X30"  Slant board stretch 2X30"  Forward step ups 4" 1 HHA 10X  Lateral step ups 4" 1 HHA 10X  Forward step downs 4" 1 HHA 10X  Forward lunges 10X bilaterally without UE assist onto 4" step Supine SAQ 10X5" Soft tissue massage with elevation, MFR to knee  07/04/23 Bike seat 4 full revolutions 5 minutes Standing:  knee flexion stretch with 12" step 10X10"  Hamstring stretch with 12" step 2X30"  Slant board stretch 2X30"  Forward step ups 4" 1 HHA 10X  Lateral step ups 4" 1 HHA 10X  Forward step downs 4" 1 HHA 10X  Forward lunges 10X bilaterally with UE assist onto 4" step  Stair negotiation 4" with 1  HR reciprocally up/down 1X Supine AROM 5-120 Soft tissue, MFR to knee; self massage instructions  07/02/23 Rocker board x 2 minutes Heel raises 15 Toe raise from slant x 5  Terminal knee extension x 10 Knee flexion x 10  Slant board x 30" x 2  Hamstring stretch on 12" stool 30" hold x 2 Lunge on 12" stool to improve flexion 30" x 2  Minisquat x 10 Lateral step up 4" x 10 6" step up x 10 Sitting sit to stand x 15 Bike  x 5 minutes  Supine : ROM  5-115 SAQ x 15  Bridge x 10   06/28/23 Rocker board x 2 minutes Heel raises 15 Terminal knee extension x 10 Knee flexion x 10  Hamstring stretch on 12" stool 30" hold x 2 Lunge on 12" stool to improve flexion 30" x 2  Minisquat x 10 6" step up x 10 Sitting sit to stand x 10 LAQ x 10   Bike  x 5 minutes   06/21/23 Standing: Heel raises x 10 Mini squat x 10 Knee flexion x 10 Standing terminal extension x 10 4" step up x 10  Sititng: LAQ x 10  Sit to stand x 10  Supine: Quad set x 10  Heel slide x 10  SAQ x 10  SLR x 10  Bridge x 10  Hip abduction x 10  Bike x 5 minutes for improved ROM   PATIENT EDUCATION:  Education details: Patient educated on exam findings, POC, scope of PT, HEP. Person educated: Patient Education method: Explanation, Demonstration, and Handouts Education comprehension: verbalized understanding, returned demonstration, verbal cues required, and tactile cues required  HOME EXERCISE PROGRAM: Access Code: [email or web address] URL: [email or web address] Date: 05/30/2023 Prepared by: [facility]  Exercises - Supine Quad Set  - 3 x daily - 7 x weekly - 2 sets - 10 reps - 5" hold - Supine Heel Slide  - 3 x daily - 7 x weekly - 1 sets - 10 reps - Seated Heel Toe Raises  - 3 x daily - 7 x weekly - 1 sets - 10 reps - Seated Heel Slide  - 3 x daily - 7 x weekly - 1 sets - 10 reps - Seated March  - 3 x daily - 7 x weekly - 2 sets - 10 reps - Seated Long Arc Quad  - 3 x daily - 7 x weekly - 1 sets -  10 reps  06/12/23  Supine Knee Extension Strengthening  - 2 x daily - 7 x weekly - 1 sets - 10 reps - 3-5" hold - Sit to Stand Without Arm Support  - 2 x daily - 7 x weekly - 1 sets - 10 reps - Heel Raises with Counter Support  - 2 x daily - 7 x weekly - 1 sets - 10 reps - 3-5 " hold  06/28/23 - Mini Squat with Counter Support  - 2 x daily -  7 x weekly - 1 sets - 10 reps - 5" hold  CLINICAL IMPRESSION:  Patient has met 3/3 short term goals and 6/6 long term goals with ability to complete HEP and improvement in symptoms, strength, ROM, activity tolerance, gait, balance, and functional mobility. Patient remains limited by functional strength with stairs but feels ready to transition to HEP. Reviewed progress with patient and HEP. Patient discharged from PT at this time.     OBJECTIVE IMPAIRMENTS: Abnormal gait, decreased activity tolerance, decreased balance, decreased endurance, decreased knowledge of use of DME, decreased mobility, difficulty walking, decreased ROM, decreased strength, hypomobility, increased edema, increased fascial restrictions, impaired perceived functional ability, impaired flexibility, postural dysfunction, and pain.   ACTIVITY LIMITATIONS: carrying, lifting, bending, sitting, standing, squatting, sleeping, stairs, transfers, bed mobility, locomotion level, and caring for others  PARTICIPATION LIMITATIONS: meal prep, cleaning, laundry, driving, shopping, community activity, and yard work  [facility] POTENTIAL: Good  CLINICAL DECISION MAKING: Stable/uncomplicated  EVALUATION COMPLEXITY: Low   GOALS: Goals reviewed with patient? No  SHORT TERM GOALS: Target date: 06/20/2023 patient will be independent with initial HEP  Baseline: Goal status: MET  2.  Patient will self report 30% improvement to improve tolerance for functional activity  Baseline:  Goal status: MET  3.  Patient will improve right knee mobility to -10 to 90 degrees to promote normal knee mechanics  with ambulation Baseline:  Goal status: MET   LONG TERM GOALS: Target date: 07/11/2023  Patient will be independent in self management strategies to improve quality of life and functional outcomes.  Baseline:  Goal status: MET  2.  Patient will self report 50% improvement to improve tolerance for functional activity  Baseline:  Goal status: MET  3.  Patient will increase right  leg MMTs to 4+ to 5/5 without pain to promote return to ambulation community distances with minimal deviation.    Baseline: see above Goal status: MET  4.  Patient will increased right knee mobility to -5 to 120 to promote normal navigation of steps; step over step pattern  Baseline: see above Goal status: met   5.  Patient will improve 5 times sit to stand score from 29.96 sec to 20 sec without use of arms to assist to demonstrate improved functional mobility and increased lower extremity strength.  Baseline: 07/15/23: 11.80 seconds without UE support Goal status: MET  6.  Patient will improve FOTO score by 20 points   Baseline: 23 07/15/23: 85% function Goal status: MET   PLAN:  PT FREQUENCY: 2x/week  PT DURATION: 6 weeks  PLANNED INTERVENTIONS: Therapeutic exercises, Therapeutic activity, Neuromuscular re-education, Balance training, Gait training, Patient/Family education, Joint manipulation, Joint mobilization, Stair training, Orthotic/Fit training, DME instructions, Aquatic Therapy, Dry Needling, Electrical stimulation, Spinal manipulation, Spinal mobilization, Cryotherapy, Moist heat, Compression bandaging, scar mobilization, Splintting, Taping, Traction, Ultrasound, Ionotophoresis 4mg /ml Dexamethasone, and Manual therapy   PLAN FOR NEXT SESSION: n/a   10:17 AM, 07/15/23 Wyman Songster PT, DPT Physical Therapist at [facility]

## 2023-07-17 ENCOUNTER — Encounter (HOSPITAL_COMMUNITY): Payer: 59 | Admitting: Physical Therapy

## 2023-07-22 ENCOUNTER — Encounter (HOSPITAL_COMMUNITY): Payer: 59 | Admitting: Physical Therapy

## 2023-07-22 ENCOUNTER — Encounter: Payer: 59 | Admitting: Orthopedic Surgery

## 2023-07-24 ENCOUNTER — Encounter (HOSPITAL_COMMUNITY): Payer: 59 | Admitting: Physical Therapy

## 2023-07-24 DIAGNOSIS — M0579 Rheumatoid arthritis with rheumatoid factor of multiple sites without organ or systems involvement: Secondary | ICD-10-CM | POA: Diagnosis not present

## 2023-07-24 DIAGNOSIS — M1991 Primary osteoarthritis, unspecified site: Secondary | ICD-10-CM | POA: Diagnosis not present

## 2023-07-24 DIAGNOSIS — Z79899 Other long term (current) drug therapy: Secondary | ICD-10-CM | POA: Diagnosis not present

## 2023-07-24 DIAGNOSIS — M25562 Pain in left knee: Secondary | ICD-10-CM | POA: Diagnosis not present

## 2023-07-24 DIAGNOSIS — M25512 Pain in left shoulder: Secondary | ICD-10-CM | POA: Diagnosis not present

## 2023-07-27 ENCOUNTER — Other Ambulatory Visit: Payer: Self-pay | Admitting: Family Medicine

## 2023-07-29 ENCOUNTER — Encounter (HOSPITAL_COMMUNITY): Payer: 59 | Admitting: Physical Therapy

## 2023-07-31 ENCOUNTER — Encounter (HOSPITAL_COMMUNITY): Payer: 59 | Admitting: Physical Therapy

## 2023-08-08 ENCOUNTER — Ambulatory Visit (INDEPENDENT_AMBULATORY_CARE_PROVIDER_SITE_OTHER): Payer: 59 | Admitting: Orthopedic Surgery

## 2023-08-08 ENCOUNTER — Encounter: Payer: Self-pay | Admitting: Orthopedic Surgery

## 2023-08-08 VITALS — BP 180/94 | HR 78 | Ht 62.0 in | Wt 154.0 lb

## 2023-08-08 DIAGNOSIS — Z96651 Presence of right artificial knee joint: Secondary | ICD-10-CM

## 2023-08-08 NOTE — Progress Notes (Signed)
VISIT TYPE: POST OP VISIT   Chief Complaint  Patient presents with   Routine Post Op    Follow up TKA right knee feels pulling in the top and bottom of the scar and pain is at a minimum She is now having problems with the left knee     Encounter Diagnosis  Name Primary?   S/P total knee replacement, right 05/28/23 Yes    POV # 2   Current Outpatient Medications:    alendronate (FOSAMAX) 70 MG tablet, TAKE 1 TABLET BY MOUTH WEEKLY  WITH 8 OZ OF PLAIN WATER 30  MINUTES BEFORE FIRST FOOD, DRINK OR MEDS. STAY UPRIGHT FOR 30  MINS, Disp: 12 tablet, Rfl: 3   amLODipine (NORVASC) 5 MG tablet, TAKE 1 TABLET BY MOUTH DAILY, Disp: 100 tablet, Rfl: 2   aspirin 81 MG chewable tablet, Chew 1 tablet (81 mg total) by mouth 2 (two) times daily., Disp: 90 tablet, Rfl: 0   benzonatate (TESSALON) 200 MG capsule, Take 1 capsule (200 mg total) by mouth 2 (two) times daily as needed for cough., Disp: 20 capsule, Rfl: 0   BIOTIN PO, Take 1 tablet by mouth in the morning., Disp: , Rfl:    budesonide-formoterol (SYMBICORT) 160-4.5 MCG/ACT inhaler, Inhale 2 puffs into the lungs 2 (two) times daily., Disp: 3 each, Rfl: 3   Calcium Carb-Cholecalciferol (CALCIUM 600 + D PO), Take 1 tablet by mouth every evening. , Disp: , Rfl:    celecoxib (CELEBREX) 200 MG capsule, Take 1 capsule (200 mg total) by mouth 2 (two) times daily., Disp: 60 capsule, Rfl: 1   Cholecalciferol (VITAMIN D3) 50 MCG (2000 UT) TABS, Take 2,000 Units by mouth in the morning., Disp: , Rfl:    docusate sodium (COLACE) 100 MG capsule, Take 1 capsule (100 mg total) by mouth 2 (two) times daily., Disp: 10 capsule, Rfl: 0   fluticasone (FLONASE) 50 MCG/ACT nasal spray, SHAKE LIQUID AND USE 2 SPRAYS IN EACH NOSTRIL DAILY (Patient taking differently: Place 2 sprays into both nostrils daily as needed for allergies.), Disp: 16 g, Rfl: 6   folic acid (FOLVITE) 800 MCG tablet, Take 800 mcg by mouth in the morning., Disp: , Rfl:    gabapentin (NEURONTIN)  100 MG capsule, TAKE 1 CAPSULE BY MOUTH AT  BEDTIME, Disp: 100 capsule, Rfl: 2   ipratropium-albuterol (DUONEB) 0.5-2.5 (3) MG/3ML SOLN, Take 3 mLs by nebulization every 6 (six) hours as needed (wheezing/shortness of breath.)., Disp: 360 mL, Rfl: 0   levothyroxine (SYNTHROID) 75 MCG tablet, TAKE 1 TABLET BY MOUTH DAILY, Disp: 100 tablet, Rfl: 2   meloxicam (MOBIC) 7.5 MG tablet, TAKE 1 TABLET BY MOUTH DAILY, Disp: 100 tablet, Rfl: 2   methocarbamol (ROBAXIN) 500 MG tablet, Take 1 tablet (500 mg total) by mouth every 6 (six) hours as needed for muscle spasms., Disp: 60 tablet, Rfl: 1   methotrexate (RHEUMATREX) 2.5 MG tablet, 6 tabs Orally ONCE A WEEK for 30 days, Disp: , Rfl:    montelukast (SINGULAIR) 10 MG tablet, TAKE 1 TABLET BY MOUTH AT  BEDTIME, Disp: 100 tablet, Rfl: 2   Multiple Vitamins-Iron (MULTIVITAMINS WITH IRON) TABS tablet, Take 1 tablet by mouth in the morning., Disp: , Rfl:    omeprazole (PRILOSEC) 40 MG capsule, TAKE 1 CAPSULE BY MOUTH TWICE  DAILY FOR 1 MONTH ONLY, Disp: 60 capsule, Rfl: 11   polyethylene glycol (MIRALAX / GLYCOLAX) 17 g packet, Take 17 g by mouth daily as needed for mild constipation., Disp: 14  each, Rfl: 0   traMADol (ULTRAM) 50 MG tablet, Take 1 tablet (50 mg total) by mouth every 6 (six) hours as needed., Disp: 28 tablet, Rfl: 0   UNABLE TO FIND, Nebulizer machine with tubing and supplies., Disp: 1 Product, Rfl: 0   vitamin B-12 (CYANOCOBALAMIN) 500 MCG tablet, Take 500 mcg by mouth in the morning., Disp: , Rfl:   Current Facility-Administered Medications:    bupivacaine-meloxicam ER (ZYNRELEF) injection 400 mg, 400 mg, Infiltration, Once, Vickki Hearing, MD   Subjective  Right eval Right 6/20 Right  7/9 Right 7/12 Right 7/19 Right 7/25 Right 7/31 Right 07/15/23   Hip flexion                  Hip extension                  Hip abduction                  Hip adduction                  Hip internal rotation                  Hip external  rotation                  Knee flexion 72 sitting Supine : 90 100 105 110 120 122 122  Knee extension -27 supine -15 10 8 6 5 3  lacking2   ASSESSMENT AND PLAN   She is doing well.  She is actually having more trouble with the left knee.  She is interested in having a left total knee replacement so we will see her in December to plan surgery for February  Looking at the right knee we have got an excellent range of motion she is functioning well walking without support there are no signs of infection she has good quadriceps control  Successful right total knee  Follow-up in December to x-ray the left knee and plan for surgery in  FEB

## 2023-08-30 ENCOUNTER — Ambulatory Visit (INDEPENDENT_AMBULATORY_CARE_PROVIDER_SITE_OTHER): Payer: 59 | Admitting: Family Medicine

## 2023-08-30 ENCOUNTER — Encounter: Payer: Self-pay | Admitting: Family Medicine

## 2023-08-30 VITALS — BP 160/80 | HR 81 | Ht 62.0 in | Wt 152.1 lb

## 2023-08-30 DIAGNOSIS — K2100 Gastro-esophageal reflux disease with esophagitis, without bleeding: Secondary | ICD-10-CM

## 2023-08-30 DIAGNOSIS — K219 Gastro-esophageal reflux disease without esophagitis: Secondary | ICD-10-CM | POA: Insufficient documentation

## 2023-08-30 DIAGNOSIS — E039 Hypothyroidism, unspecified: Secondary | ICD-10-CM | POA: Diagnosis not present

## 2023-08-30 DIAGNOSIS — J0100 Acute maxillary sinusitis, unspecified: Secondary | ICD-10-CM | POA: Diagnosis not present

## 2023-08-30 DIAGNOSIS — I10 Essential (primary) hypertension: Secondary | ICD-10-CM | POA: Diagnosis not present

## 2023-08-30 DIAGNOSIS — J209 Acute bronchitis, unspecified: Secondary | ICD-10-CM | POA: Diagnosis not present

## 2023-08-30 DIAGNOSIS — J440 Chronic obstructive pulmonary disease with acute lower respiratory infection: Secondary | ICD-10-CM | POA: Diagnosis not present

## 2023-08-30 MED ORDER — OLMESARTAN MEDOXOMIL 20 MG PO TABS: 20.0000 mg | ORAL_TABLET | Freq: Every day | ORAL | 2 refills | Status: DC

## 2023-08-30 MED ORDER — AZITHROMYCIN 250 MG PO TABS: ORAL_TABLET | ORAL | 0 refills | Status: AC

## 2023-08-30 MED ORDER — BENZONATATE 100 MG PO CAPS: 100.0000 mg | ORAL_CAPSULE | Freq: Two times a day (BID) | ORAL | 0 refills | Status: DC | PRN

## 2023-08-30 MED ORDER — OMEPRAZOLE 40 MG PO CPDR: DELAYED_RELEASE_CAPSULE | ORAL | 2 refills | Status: DC

## 2023-08-30 NOTE — Assessment & Plan Note (Signed)
Updated lab needed at/ before next visit.   

## 2023-08-30 NOTE — Assessment & Plan Note (Signed)
Uncontrolled, olmesartam 20  mg added DASH diet and commitment to daily physical activity for a minimum of 30 minutes discussed and encouraged, as a part of hypertension management. The importance of attaining a healthy weight is also discussed.     08/30/2023    8:58 AM 08/30/2023    8:37 AM 08/30/2023    8:36 AM 08/08/2023    3:38 PM 05/29/2023    4:14 AM 05/29/2023    1:00 AM 05/28/2023    8:44 PM  BP/Weight  Systolic BP 160 153 162 180 158 145 141  Diastolic BP 80 84 80 94 66 70 58  Wt. (Lbs)   152.08 154     BMI   27.82 kg/m2 28.17 kg/m2

## 2023-08-30 NOTE — Assessment & Plan Note (Signed)
Z pack and tessalon perles prescribed

## 2023-08-30 NOTE — Patient Instructions (Addendum)
F/U in 4 to 6 weeks , re evaluateave a  great terip! blood pressure and for flu vaccine , call if you need me sooner  New additional medication for blood pressure is olmesartan 20 mg one daily, continue amlodipine 5 mg one daily as  before  You are treated for acute bronchitis and sinusitis, tessalon perles and azithromycin   CBC , chem 7 and EGFR today and TSH  Omeprazole has been sent to mail order  Have a great trip , thankful re right knee replacement , and enjoy those great grands!!  Thanks for choosing [facility], we consider it a privelige to serve you.

## 2023-08-30 NOTE — Progress Notes (Signed)
Bianca Mcguire     MRN: [id number]      DOB: 1946-04-07  Chief Complaint  Patient presents with   Follow-up    Follow up cough    HPI Bianca Mcguire is here for follow up and re-evaluation of chronic medical conditions, medication management and review of any available recent lab and radiology data.  Preventive health is updated, specifically  Cancer screening and Immunization.   Great result from left knee replacement in June 4 day h/o head and chest congestion with green sputum. ROS Denies recent fever or chills. . Denies chest pains, palpitations and leg swelling Denies abdominal pain, nausea, vomiting,diarrhea or constipation.   Denies dysuria, frequency, hesitancy or incontinence. Left knee  pain, swelling and limitation in mobility. Denies headaches, seizures, numbness, or tingling. Denies depression, anxiety or insomnia. Denies skin break down or rash.   PE  BP (!) 160/80   Pulse 81   Ht 5\' 2"  (1.575 m)   Wt 152 lb 1.3 oz (69 kg)   SpO2 90%   BMI 27.82 kg/m   Patient alert and oriented and in no cardiopulmonary distress.  HEENT: No facial asymmetry, EOMI,     Neck supple .Maxillary pressure  Chest: decreased air entry , scattered cracklesy.  CVS: S1, S2 no murmurs, no S3.Regular rate.  ABD: Soft non tender.   Ext: No edema  MS: Adequate ROM spine, shoulders, hips and reduced in left knee knees.  Skin: Intact, no ulcerations or rash noted.  Psych: Good eye contact, normal affect. Memory intact not anxious or depressed appearing.  CNS: CN 2-12 intact, power,  normal throughout.no focal deficits noted.   Assessment & Plan  Acute bronchitis with COPD (HCC) Z pack and tessalon perles prescribed  Acute maxillary sinusitis Z pack prescribed  Adult hypothyroidism Updated lab needed at/ before next visit.   Essential hypertension Uncontrolled, olmesartam 20  mg added DASH diet and commitment to daily physical activity for a minimum of 30 minutes  discussed and encouraged, as a part of hypertension management. The importance of attaining a healthy weight is also discussed.     08/30/2023    8:58 AM 08/30/2023    8:37 AM 08/30/2023    8:36 AM 08/08/2023    3:38 PM 05/29/2023    4:14 AM 05/29/2023    1:00 AM 05/28/2023    8:44 PM  BP/Weight  Systolic BP 160 153 162 180 158 145 141  Diastolic BP 80 84 80 94 66 70 58  Wt. (Lbs)   152.08 154     BMI   27.82 kg/m2 28.17 kg/m2          GERD (gastroesophageal reflux disease) OMeprzole prescribed

## 2023-08-30 NOTE — Assessment & Plan Note (Signed)
Z pack prescribed

## 2023-08-30 NOTE — Assessment & Plan Note (Signed)
OMeprzole prescribed

## 2023-08-31 LAB — BMP8+EGFR
BUN/Creatinine Ratio: 24 (ref 12–28)
BUN: 17 mg/dL (ref 8–27)
CO2: 22 mmol/L (ref 20–29)
Calcium: 9.3 mg/dL (ref 8.7–10.3)
Chloride: 107 mmol/L — ABNORMAL HIGH (ref 96–106)
Creatinine, Ser: 0.71 mg/dL (ref 0.57–1.00)
Glucose: 95 mg/dL (ref 70–99)
Potassium: 4.2 mmol/L (ref 3.5–5.2)
Sodium: 142 mmol/L (ref 134–144)
eGFR: 88 mL/min/{1.73_m2} (ref >59)

## 2023-08-31 LAB — CBC
Hematocrit: 36.7 % (ref 34.0–46.6)
Hemoglobin: 11.5 g/dL (ref 11.1–15.9)
MCH: 26.9 pg (ref 26.6–33.0)
MCHC: 31.3 g/dL — ABNORMAL LOW (ref 31.5–35.7)
MCV: 86 fL (ref 79–97)
Platelets: 253 10*3/uL (ref 150–450)
RBC: 4.27 x10E6/uL (ref 3.77–5.28)
RDW: 14.4 % (ref 11.7–15.4)
WBC: 6.9 10*3/uL (ref 3.4–10.8)

## 2023-10-14 ENCOUNTER — Ambulatory Visit: Payer: 59 | Admitting: Internal Medicine

## 2023-10-15 ENCOUNTER — Ambulatory Visit (INDEPENDENT_AMBULATORY_CARE_PROVIDER_SITE_OTHER): Payer: 59 | Admitting: Family Medicine

## 2023-10-15 ENCOUNTER — Encounter: Payer: Self-pay | Admitting: Family Medicine

## 2023-10-15 VITALS — BP 129/78 | HR 73 | Temp 98.3°F | Resp 16 | Ht 62.0 in | Wt 149.0 lb

## 2023-10-15 DIAGNOSIS — I10 Essential (primary) hypertension: Secondary | ICD-10-CM | POA: Diagnosis not present

## 2023-10-15 DIAGNOSIS — Z23 Encounter for immunization: Secondary | ICD-10-CM

## 2023-10-15 DIAGNOSIS — E039 Hypothyroidism, unspecified: Secondary | ICD-10-CM | POA: Diagnosis not present

## 2023-10-15 DIAGNOSIS — R7302 Impaired glucose tolerance (oral): Secondary | ICD-10-CM

## 2023-10-15 DIAGNOSIS — R053 Chronic cough: Secondary | ICD-10-CM | POA: Insufficient documentation

## 2023-10-15 DIAGNOSIS — Z87891 Personal history of nicotine dependence: Secondary | ICD-10-CM

## 2023-10-15 MED ORDER — AZITHROMYCIN 250 MG PO TABS: ORAL_TABLET | ORAL | 0 refills | Status: AC

## 2023-10-15 MED ORDER — PREDNISONE 10 MG PO TABS
10.0000 mg | ORAL_TABLET | Freq: Two times a day (BID) | ORAL | 0 refills | Status: DC
Start: 2023-10-15 — End: 2023-11-18

## 2023-10-15 NOTE — Progress Notes (Signed)
GARIELLE MROZ     MRN: [id number]      DOB: [birth year]  Chief Complaint  Patient presents with   Cough    Still having the cough, not producing as much phlegm but its still yellow and some days she can hardly breathe. Worse at night and when she wakes up. Has been taking tessalon but it doesn't help much    HPI [name] is here for follow up and re-evaluation of chronic medical conditions, medication management and review of any available recent lab and radiology data.  Preventive health is updated, specifically  Cancer screening and Immunization.   COughing for over 8 weeks despite treatment, worse at night, sometimes sputum is produced which is yllow, no fever, chills or sinus symptoms . No reflux symptoms  ROS Denies recent fever or chills. Denies sinus pressure, nasal congestion, ear pain or sore throat. . Denies chest pains, palpitations and leg swelling Denies abdominal pain, nausea, vomiting,diarrhea or constipation.   Denies dysuria, frequency, hesitancy or incontinence. Denies uncontrolled  joint pain, swelling and limitation in mobility. Denies headaches, seizures, numbness, or tingling. Denies depression, anxiety , sleep disturbed by cough Denies skin break down or rash.   PE  BP 129/78   Pulse 73   Temp 98.3 F (36.8 C) (Oral)   Resp 16   Ht 5\' 2"  (1.575 m)   Wt 149 lb (67.6 kg)   SpO2 94%   BMI 27.25 kg/m   Patient alert and oriented and having bouts of coughing HEENT: No facial asymmetry, EOMI,     Neck decreaed ROM.  Chest: decreased air entry with bibasilar crackles CVS: S1, S2 no murmurs, no S3.Regular rate.  ABD: Soft non tender.   Ext: No edema  GE:[id number] ROM spine, shoulders, hips and knees.  Skin: Intact, no ulcerations or rash noted.  Psych: Good eye contact, normal affect. Memory intact not anxious or depressed appearing.  CNS: CN 2-12 intact, power,  normal throughout.no focal deficits noted.   Assessment & Plan  Chronic  cough Over 8 week history , past h/o smoking , sputum production yellow, Prednisone x 5 dasy, z pack, chest scan and refer to Pulmonary  Encounter for immunization After obtaining informed consent, the vaccine is  administered , with no adverse effect noted at the time of administration.   IGT (impaired glucose tolerance) Patient educated about the importance of limiting  Carbohydrate intake , the need to commit to daily physical activity for a minimum of 30 minutes , and to commit weight loss. The fact that changes in all these areas will reduce or eliminate all together the development of diabetes is stressed.      Latest Ref Rng & Units 08/30/2023    9:17 AM 05/29/2023    4:46 AM 05/21/2023    3:49 PM 03/01/2023   10:56 AM 02/28/2022    8:47 AM  Diabetic Labs  HbA1c 4.8 - 5.6 %    5.7    Chol 100 - 199 mg/dL    401  027   HDL >25 mg/dL    72  67   Calc LDL 0 - 99 mg/dL    366  440   Triglycerides 0 - 149 mg/dL    73  64   Creatinine 0.57 - 1.00 mg/dL 3.47  4.25  9.56  3.87  0.71       10/15/2023    9:10 AM 08/30/2023    8:58 AM 08/30/2023    8:37 AM  08/30/2023    8:36 AM 08/08/2023    3:38 PM 05/29/2023    4:14 AM 05/29/2023    1:00 AM  BP/Weight  Systolic BP 129 160 153 162 180 158 145  Diastolic BP 78 80 84 80 94 66 70  Wt. (Lbs) 149   152.08 154    BMI 27.25 kg/m2   27.82 kg/m2 28.17 kg/m2        Latest Ref Rng & Units 11/25/2015    8:41 AM  Foot/eye exam completion dates  Eye Exam No Retinopathy No Retinopathy         This result is from an external source.     Updated lab needed at/ before next visit.   Adult hypothyroidism Updated lab needed at/ before next visit.

## 2023-10-15 NOTE — Assessment & Plan Note (Signed)
Patient educated about the importance of limiting  Carbohydrate intake , the need to commit to daily physical activity for a minimum of 30 minutes , and to commit weight loss. The fact that changes in all these areas will reduce or eliminate all together the development of diabetes is stressed.      Latest Ref Rng & Units 08/30/2023    9:17 AM 05/29/2023    4:46 AM 05/21/2023    3:49 PM 03/01/2023   10:56 AM 02/28/2022    8:47 AM  Diabetic Labs  HbA1c 4.8 - 5.6 %    5.7    Chol 100 - 199 mg/dL    782  956   HDL >21 mg/dL    72  67   Calc LDL 0 - 99 mg/dL    308  657   Triglycerides 0 - 149 mg/dL    73  64   Creatinine 0.57 - 1.00 mg/dL 8.46  9.62  9.52  8.41  0.71       10/15/2023    9:10 AM 08/30/2023    8:58 AM 08/30/2023    8:37 AM 08/30/2023    8:36 AM 08/08/2023    3:38 PM 05/29/2023    4:14 AM 05/29/2023    1:00 AM  BP/Weight  Systolic BP 129 160 153 162 180 158 145  Diastolic BP 78 80 84 80 94 66 70  Wt. (Lbs) 149   152.08 154    BMI 27.25 kg/m2   27.82 kg/m2 28.17 kg/m2        Latest Ref Rng & Units 11/25/2015    8:41 AM  Foot/eye exam completion dates  Eye Exam No Retinopathy No Retinopathy         This result is from an external source.     Updated lab needed at/ before next visit.

## 2023-10-15 NOTE — Assessment & Plan Note (Signed)
Updated lab needed at/ before next visit.   

## 2023-10-15 NOTE — Assessment & Plan Note (Signed)
After obtaining informed consent, the vaccine is  administered , with no adverse effect noted at the time of administration.  

## 2023-10-15 NOTE — Patient Instructions (Addendum)
Follow-up in 10 to 12 weeks, call if you need me sooner.  Please schedule wellness visit at checkout.  Flu vaccine in office today.  Pls get shingrix and TdaP vaccines at your pharmacy, covid vaccine also recommended  You are referred for chest scan it is important to get this done as well as be evaluated by the lung specialist.  Please schedule appointment with Dr. Sherene Sires  as at the earliest appointment available while patient is checking out today  Prednisone 10 mg twice daily for 5 days and azithromycin and antibiotic prescribed for your cough.  Please submit sputum for culture once you are able to get her good specimen of thick yellow sputum so you can see if bacterial is growing in your lung.  Labs today TSH and hBA1C  Creful not tofall,best for 2025!

## 2023-10-15 NOTE — Assessment & Plan Note (Signed)
Over 8 week history , past h/o smoking , sputum production yellow, Prednisone x 5 dasy, z pack, chest scan and refer to Pulmonary

## 2023-10-16 LAB — HEMOGLOBIN A1C
Est. average glucose Bld gHb Est-mCnc: 120 mg/dL
Hgb A1c MFr Bld: 5.8 % — ABNORMAL HIGH (ref 4.8–5.6)

## 2023-10-16 LAB — TSH: TSH: 1.22 u[IU]/mL (ref 0.450–4.500)

## 2023-10-23 ENCOUNTER — Ambulatory Visit (HOSPITAL_COMMUNITY): Payer: 59

## 2023-11-11 ENCOUNTER — Other Ambulatory Visit: Payer: Self-pay | Admitting: Family Medicine

## 2023-11-14 ENCOUNTER — Ambulatory Visit: Payer: 59 | Admitting: Orthopedic Surgery

## 2023-11-16 NOTE — Progress Notes (Unsigned)
Bianca Mcguire, female    DOB: [birth year]    MRN: [id number]   Brief patient profile:  1  yowf  quit smoking 05/2014 with pna as child /sinus and ear problems  referred to pulmonary clinic in [place]  11/18/2023 by Dr Bianca Mcguire for cough onset insidious x 2022 not related to covid to her knowledge  no better or prednisone or symbicort by her hx     History of Present Illness  11/18/2023  Pulmonary/ 1st office eval/ Bianca Mcguire /  Office  Chief Complaint  Patient presents with   Establish Care   Cough  Dyspnea:  maybe 150 ft to MB flat at nl pace ok  Cough: at hs almost immediate slt yellowish and first thing in am and then all day smoldering s obvious assoc nasal symptoms  Sleep: flat bed/ 2 pillows  SABA use: may help cough some  02: none     No obvious day to day or daytime pattern/variability or assoc excess/ purulent sputum or mucus plugs or hemoptysis or cp or chest tightness, subjective wheeze or overt sinus or hb symptoms.    Also denies any obvious fluctuation of symptoms with weather or environmental changes or other aggravating or alleviating factors except as outlined above   No unusual exposure hx or h/o  asthma or knowledge of premature birth.  Current Allergies, Complete Past Medical History, Past Surgical History, Family History, and Social History were reviewed in [name] record.  ROS  The following are not active complaints unless bolded Hoarseness, sore throat, dysphagia, dental problems, itching, sneezing,  nasal congestion or discharge of excess mucus or purulent secretions, ear ache,   fever, chills, sweats, unintended wt loss or wt gain, classically pleuritic or exertional cp,  orthopnea pnd or arm/hand swelling  or leg swelling, presyncope, palpitations, abdominal pain, anorexia, nausea, vomiting, diarrhea  or change in bowel habits or change in bladder habits, change in stools or change in urine, dysuria, hematuria,  rash,  arthralgias, visual complaints, headache, numbness, weakness or ataxia or problems with walking or coordination,  change in mood or  memory.            Outpatient Medications Prior to Visit  Medication Sig Dispense Refill   alendronate (FOSAMAX) 70 MG tablet TAKE 1 TABLET BY MOUTH WEEKLY  WITH 8 OZ OF PLAIN WATER 30  MINUTES BEFORE FIRST FOOD, DRINK OR MEDS. STAY UPRIGHT FOR 30  MINS 12 tablet 3   amLODipine (NORVASC) 5 MG tablet TAKE 1 TABLET BY MOUTH DAILY 100 tablet 2   BIOTIN PO Take 1 tablet by mouth in the morning.     budesonide-formoterol (SYMBICORT) 160-4.5 MCG/ACT inhaler Inhale 2 puffs into the lungs 2 (two) times daily. 3 each 3   Calcium Carb-Cholecalciferol (CALCIUM 600 + D PO) Take 1 tablet by mouth every evening.      celecoxib (CELEBREX) 200 MG capsule Take 1 capsule (200 mg total) by mouth 2 (two) times daily. 60 capsule 1   Cholecalciferol (VITAMIN D3) 50 MCG (2000 UT) TABS Take 2,000 Units by mouth in the morning.     docusate sodium (COLACE) 100 MG capsule Take 1 capsule (100 mg total) by mouth 2 (two) times daily. 10 capsule 0   fluticasone (FLONASE) 50 MCG/ACT nasal spray SHAKE LIQUID AND USE 2 SPRAYS IN EACH NOSTRIL DAILY (Patient taking differently: Place 2 sprays into both nostrils daily as needed for allergies.) 16 g 6   folic acid (FOLVITE) 800 MCG tablet  Take 800 mcg by mouth in the morning.     gabapentin (NEURONTIN) 100 MG capsule TAKE 1 CAPSULE BY MOUTH AT  BEDTIME 100 capsule 2   ipratropium-albuterol (DUONEB) 0.5-2.5 (3) MG/3ML SOLN Take 3 mLs by nebulization every 6 (six) hours as needed (wheezing/shortness of breath.). 360 mL 0   levothyroxine (SYNTHROID) 75 MCG tablet TAKE 1 TABLET BY MOUTH DAILY 100 tablet 2   methocarbamol (ROBAXIN) 500 MG tablet Take 1 tablet (500 mg total) by mouth every 6 (six) hours as needed for muscle spasms. 60 tablet 1   methotrexate (RHEUMATREX) 2.5 MG tablet 6 tabs Orally ONCE A WEEK for 30 days     montelukast (SINGULAIR) 10 MG  tablet TAKE 1 TABLET BY MOUTH AT  BEDTIME 100 tablet 2   Multiple Vitamins-Iron (MULTIVITAMINS WITH IRON) TABS tablet Take 1 tablet by mouth in the morning.     olmesartan (BENICAR) 20 MG tablet Take 1 tablet (20 mg total) by mouth daily. 30 tablet 2   omeprazole (PRILOSEC) 40 MG capsule TAKE 1 CAPSULE BY MOUTH ONCE   DAILY BEFORE BREAKFAST 90 capsule 2   UNABLE TO FIND Nebulizer machine with tubing and supplies. 1 Product 0   vitamin B-12 (CYANOCOBALAMIN) 500 MCG tablet Take 500 mcg by mouth in the morning.     aspirin 81 MG chewable tablet Chew 1 tablet (81 mg total) by mouth 2 (two) times daily. (Patient not taking: Reported on 11/18/2023) 90 tablet 0   polyethylene glycol (MIRALAX / GLYCOLAX) 17 g packet Take 17 g by mouth daily as needed for mild constipation. (Patient not taking: Reported on 11/18/2023) 14 each 0   predniSONE (DELTASONE) 10 MG tablet Take 1 tablet (10 mg total) by mouth 2 (two) times daily with a meal. 10 tablet 0   Facility-Administered Medications Prior to Visit  Medication Dose Route Frequency Provider Last Rate Last Admin   bupivacaine-meloxicam ER (ZYNRELEF) injection 400 mg  400 mg Infiltration Once Vickki Hearing, MD         Past Medical History:  Diagnosis Date   Acute pain of right knee 01/30/2011   Qualifier: Diagnosis of  By: Bianca Hong MD, Kellie     Anemia    Arthritis    Carpal tunnel syndrome, bilateral    COPD (chronic obstructive pulmonary disease) (HCC)    Diverticulosis dx 2011   GERD (gastroesophageal reflux disease)    Headache    Hypertension 06/02/2018   Hypothyroidism    Low back pain    Obesity    Rheumatoid arthritis(714.0)       Objective:     BP 116/75   Pulse 81   Ht 5\' 2"  (1.575 m)   Wt 152 lb (68.9 kg)   SpO2 92%   BMI 27.80 kg/m   SpO2: 92 % RA   Amb somber wf    HEENT : Oropharynx  clear/ edentulous/ / ears ok      Nasal turbinates nl    NECK :  without  apparent JVD/ palpable Nodes/TM    LUNGS: no acc  muscle use,  Nl contour chest which is clear to A and P bilaterally without cough on insp or exp maneuvers   CV:  RRR  no s3 or murmur or increase in P2, and no edema   ABD:  soft and nontender with nl inspiratory excursion in the supine position. No bruits or organomegaly appreciated   MS:  Nl gait/ ext warm with  RA deformities  both hands  Or obvious joint restrictions  calf tenderness, cyanosis or clubbing    SKIN: warm and dry without lesions    NEURO:  alert, approp, nl sensorium with  no motor or cerebellar deficits apparent.          I personally reviewed images and agree with radiology impression as follows:  CXR:   pa and lateral 03/01/23  1. No acute pulmonary process. 2. Cardiomegaly   I personally reviewed images and agree with radiology impression as follows: Chest CTa    03/31/21 scattered tree-in-bud nodularity bilaterally with more confluent areas of consolidation and ground-glass opacity most pronounced within the periphery of the right lower lobe. Findings are most suggestive of an infectious or inflammatory process, including atypical viral infection such as COVID-19.    Assessment   Chronic cough Onset ? 2022 / indolent / persistent and always worse at hs / neg resp to abx - 11/18/2023  After extensive coaching inhaler device,  effectiveness =    50% from a baseline of 50%  Dx is AB related to resp bronchiolitis from RA or  UACS : Upper airway cough syndrome (previously labeled PNDS),  is so named because it's frequently impossible to sort out how much is  CR/sinusitis with freq throat clearing (which can be related to primary GERD)   vs  causing  secondary (" extra esophageal")  GERD from wide swings in gastric pressure that occur with throat clearing, often  promoting self use of mint and menthol lozenges that reduce the lower esophageal sphincter tone and exacerbate the problem further in a cyclical fashion.   These are the same pts (now being labeled as  having "irritable larynx syndrome" by some cough centers) who not infrequently have a history of having failed to tolerate ace inhibitors,  dry powder inhalers or biphosphonates or report having atypical/extraesophageal reflux symptoms that don't respond to standard doses of PPI  and are easily confused as having aecopd or asthma flares by even experienced allergists/ pulmonologists (myself included).    Rec Try off fosamax x 6 weeks on max gerd rx/ control cyclical cough with mucinex dm 1200 mg bid prn (since coughing hard makes GERD worse)   For bronchiolitis >  pred  20 mg daily until better then 10 mg daily x 5 days and stop  For RA or sinusitis related AB > symbicort 80 2bid and check sinus CT to be complete  F/u in 6 weeks with all respiratory meds in hand using a trust but verify approach to confirm accurate Medication  Reconciliation The principal here is that until we are certain that the  patients are doing what we've asked, it makes no sense to ask them to do more.           Each maintenance medication was reviewed in detail including emphasizing most importantly the difference between maintenance and prns and under what circumstances the prns are to be triggered using an action plan format where appropriate.  Total time for H and P, chart review, counseling, reviewing hfa/neb device(s) and generating customized AVS unique to this office visit / same day charting > 45 min new pt with refractory complex symptoms.          Sandrea Hughs, MD 11/18/2023

## 2023-11-18 ENCOUNTER — Encounter: Payer: Self-pay | Admitting: Internal Medicine

## 2023-11-18 ENCOUNTER — Ambulatory Visit: Payer: 59 | Admitting: Internal Medicine

## 2023-11-18 ENCOUNTER — Telehealth: Payer: Self-pay

## 2023-11-18 VITALS — BP 116/75 | HR 81 | Ht 62.0 in | Wt 152.0 lb

## 2023-11-18 DIAGNOSIS — R053 Chronic cough: Secondary | ICD-10-CM | POA: Diagnosis not present

## 2023-11-18 MED ORDER — BREZTRI AEROSPHERE 160-9-4.8 MCG/ACT IN AERO: 2.0000 | INHALATION_SPRAY | Freq: Two times a day (BID) | RESPIRATORY_TRACT | Status: DC

## 2023-11-18 MED ORDER — BUDESONIDE-FORMOTEROL FUMARATE 80-4.5 MCG/ACT IN AERO: INHALATION_SPRAY | RESPIRATORY_TRACT | 12 refills | Status: DC

## 2023-11-18 MED ORDER — PREDNISONE 10 MG PO TABS
ORAL_TABLET | ORAL | 0 refills | Status: DC
Start: 2023-11-18 — End: 2024-09-23

## 2023-11-18 NOTE — Telephone Encounter (Signed)
LVM for patient to return call to advise Dr. Sherene Sires has ordered a CXR and sinus CT. She can get CXR at [facility] and someone will contact her to schedule CT.  Please advise if patient returns call. Thanks!

## 2023-11-18 NOTE — Assessment & Plan Note (Signed)
Onset ? 2022 / indolent / persistent and always worse at hs / neg resp to abx - 11/18/2023  After extensive coaching inhaler device,  effectiveness =    50% from a baseline of 50%  Dx is AB related to resp bronchiolitis from RA or  UACS : Upper airway cough syndrome (previously labeled PNDS),  is so named because it's frequently impossible to sort out how much is  CR/sinusitis with freq throat clearing (which can be related to primary GERD)   vs  causing  secondary (" extra esophageal")  GERD from wide swings in gastric pressure that occur with throat clearing, often  promoting self use of mint and menthol lozenges that reduce the lower esophageal sphincter tone and exacerbate the problem further in a cyclical fashion.   These are the same pts (now being labeled as having "irritable larynx syndrome" by some cough centers) who not infrequently have a history of having failed to tolerate ace inhibitors,  dry powder inhalers or biphosphonates or report having atypical/extraesophageal reflux symptoms that don't respond to standard doses of PPI  and are easily confused as having aecopd or asthma flares by even experienced allergists/ pulmonologists (myself included).    Rec Try off fosamax x 6 weeks on max gerd rx/ control cyclical cough with mucinex dm 1200 mg bid prn (since coughing hard makes GERD worse)   For bronchiolitis >  pred  20 mg daily until better then 10 mg daily x 5 days and stop  For RA or sinusitis related AB > symbicort 80 2bid and check sinus CT to be complete  F/u in 6 weeks with all respiratory meds in hand using a trust but verify approach to confirm accurate Medication  Reconciliation The principal here is that until we are certain that the  patients are doing what we've asked, it makes no sense to ask them to do more.           Each maintenance medication was reviewed in detail including emphasizing most importantly the difference between maintenance and prns and under what  circumstances the prns are to be triggered using an action plan format where appropriate.  Total time for H and P, chart review, counseling, reviewing hfa/neb device(s) and generating customized AVS unique to this office visit / same day charting > 45 min new pt with refractory complex symptoms.

## 2023-11-18 NOTE — Patient Instructions (Addendum)
Stop fosamax for now   Continue Omeprazole 40 mg Take 30-60 min before first meal of the day and take Pepcid 20 mg (over the counter) after supper .  Plan A = Automatic = Always=    symbicort 160 (change to 80) Take 2 puffs first thing in am and then another 2 puffs about 12 hours later. Markus Daft one twice daily)    Work on inhaler technique:  relax and gently blow all the way out then take a nice smooth full deep breath back in, triggering the inhaler at same time you start breathing in.  Hold breath in for at least  5 seconds if you can. Blow out smbicort  thru nose. Rinse and gargle with water when done.  If mouth or throat bother you at all,  try brushing teeth/gums/tongue with arm and hammer toothpaste/ make a slurry and gargle and spit out.      Plan B = Backup (to supplement plan A, not to replace it) Only use your albuterol inhaler as a rescue medication to be used if you can't catch your breath by resting or doing a relaxed purse lip breathing pattern.  - The less you use it, the better it will work when you need it. - Ok to use the inhaler up to 2 puffs  every 4 hours if you must but call for appointment if use goes up over your usual need - Don't leave home without it !!  (think of it like the spare tire for your car)   Plan C = Crisis (instead of Plan B but only if Plan B stops working) - only use your albuterol nebulizer if you first try Plan B and it fails to help > ok to use the nebulizer up to every 4 hours but if start needing it regularly call for immediate appointment  Prednisone 10 mg 2 daily until better then 1 daily x 5 days (take with breakfast)   For cough/ congestion >   mucinex dm  up to maximum of  1200 mg every 12 hours as needed   Please schedule a follow up office visit in 4 weeks, sooner if needed - bring all inhalers   Late add:   needs cxr / ct sinus also ordered

## 2023-11-18 NOTE — Telephone Encounter (Signed)
Patient aware that x-ray is walk in at [facility] and the Patient Care Coordinator will call her to schedule the CT scan

## 2023-11-19 NOTE — Progress Notes (Unsigned)
GI Office Note    Referring Provider: Kerri Perches, MD Primary Care Physician:  Kerri Perches, MD Primary Gastroenterologist: Hennie Duos. Marletta Lor, DO  Date:  11/19/2023  ID:  Bianca Mcguire, DOB 02-21-1946, MRN [id number]   Chief Complaint   No chief complaint on file.  History of Present Illness  Bianca Mcguire is a 77 y.o. female with a history of anemia, arthritis, GERD, HTN, carpal tunnel presenting today for follow-up ***  + Cologuard December 2022.   Colonoscopy 05/01/22: -non bleeding internal hemorrhoids.  -left sided diverticulosis -no repeat due to age.    OV 03/20/23. Seen for 2 month hx of dysphagia. Getting choked twice per week primarily with solids. Occasional regurg. Meloxicam daily for arthritis. Uses symbicort for COPD and not rinsing mouth. Advised to avoid rough textures. Continue PPI BID and follow up in 2 months - if not improved then proceed with EGD. Advised to washout mouth post symbicort. Candidal esophagitis within differential.   Last office visit 05/21/23.  She denies any nausea or vomiting.  Reported good appetite.  Taking omeprazole twice daily, previously only needed it once daily.  She reports rinsing after her Symbicort and doing well with this.  Denies any odynophagia or white coating.  Dysphagia improved, able to eat anything she wants.  Having constipation every now and then but does not usually need any bowel regimen.  Denied chest pain or shortness of breath.  Advised to follow-up in 6 months and consider weaning PPI from twice daily.  She did admit to regular NSAID use.   Today:    Wt Readings from Last 3 Encounters:  11/18/23 152 lb (68.9 kg)  10/15/23 149 lb (67.6 kg)  08/30/23 152 lb 1.3 oz (69 kg)    Current Outpatient Medications  Medication Sig Dispense Refill   alendronate (FOSAMAX) 70 MG tablet TAKE 1 TABLET BY MOUTH WEEKLY  WITH 8 OZ OF PLAIN WATER 30  MINUTES BEFORE FIRST FOOD, DRINK OR MEDS. STAY UPRIGHT FOR 30   MINS 12 tablet 3   amLODipine (NORVASC) 5 MG tablet TAKE 1 TABLET BY MOUTH DAILY 100 tablet 2   aspirin 81 MG chewable tablet Chew 1 tablet (81 mg total) by mouth 2 (two) times daily. (Patient not taking: Reported on 11/18/2023) 90 tablet 0   BIOTIN PO Take 1 tablet by mouth in the morning.     Budeson-Glycopyrrol-Formoterol (BREZTRI AEROSPHERE) 160-9-4.8 MCG/ACT AERO Inhale 2 puffs into the lungs in the morning and at bedtime.     budesonide-formoterol (SYMBICORT) 80-4.5 MCG/ACT inhaler Take 2 puffs first thing in am and then another 2 puffs about 12 hours later. 1 each 12   Calcium Carb-Cholecalciferol (CALCIUM 600 + D PO) Take 1 tablet by mouth every evening.      celecoxib (CELEBREX) 200 MG capsule Take 1 capsule (200 mg total) by mouth 2 (two) times daily. 60 capsule 1   Cholecalciferol (VITAMIN D3) 50 MCG (2000 UT) TABS Take 2,000 Units by mouth in the morning.     docusate sodium (COLACE) 100 MG capsule Take 1 capsule (100 mg total) by mouth 2 (two) times daily. 10 capsule 0   fluticasone (FLONASE) 50 MCG/ACT nasal spray SHAKE LIQUID AND USE 2 SPRAYS IN EACH NOSTRIL DAILY (Patient taking differently: Place 2 sprays into both nostrils daily as needed for allergies.) 16 g 6   folic acid (FOLVITE) 800 MCG tablet Take 800 mcg by mouth in the morning.     gabapentin (NEURONTIN) 100  MG capsule TAKE 1 CAPSULE BY MOUTH AT  BEDTIME 100 capsule 2   ipratropium-albuterol (DUONEB) 0.5-2.5 (3) MG/3ML SOLN Take 3 mLs by nebulization every 6 (six) hours as needed (wheezing/shortness of breath.). 360 mL 0   levothyroxine (SYNTHROID) 75 MCG tablet TAKE 1 TABLET BY MOUTH DAILY 100 tablet 2   methocarbamol (ROBAXIN) 500 MG tablet Take 1 tablet (500 mg total) by mouth every 6 (six) hours as needed for muscle spasms. 60 tablet 1   methotrexate (RHEUMATREX) 2.5 MG tablet 6 tabs Orally ONCE A WEEK for 30 days     montelukast (SINGULAIR) 10 MG tablet TAKE 1 TABLET BY MOUTH AT  BEDTIME 100 tablet 2   Multiple  Vitamins-Iron (MULTIVITAMINS WITH IRON) TABS tablet Take 1 tablet by mouth in the morning.     olmesartan (BENICAR) 20 MG tablet Take 1 tablet (20 mg total) by mouth daily. 30 tablet 2   omeprazole (PRILOSEC) 40 MG capsule TAKE 1 CAPSULE BY MOUTH ONCE   DAILY BEFORE BREAKFAST 90 capsule 2   polyethylene glycol (MIRALAX / GLYCOLAX) 17 g packet Take 17 g by mouth daily as needed for mild constipation. (Patient not taking: Reported on 11/18/2023) 14 each 0   predniSONE (DELTASONE) 10 MG tablet Take 2 daily until better then 1 daily  x 5 days and stop 100 tablet 0   UNABLE TO FIND Nebulizer machine with tubing and supplies. 1 Product 0   vitamin B-12 (CYANOCOBALAMIN) 500 MCG tablet Take 500 mcg by mouth in the morning.     Current Facility-Administered Medications  Medication Dose Route Frequency Provider Last Rate Last Admin   bupivacaine-meloxicam ER (ZYNRELEF) injection 400 mg  400 mg Infiltration Once Vickki Hearing, MD        Past Medical History:  Diagnosis Date   Acute pain of right knee 01/30/2011   Qualifier: Diagnosis of  By: Lodema Hong MD, Lakeisha     Anemia    Arthritis    Carpal tunnel syndrome, bilateral    COPD (chronic obstructive pulmonary disease) (HCC)    Diverticulosis dx 2011   GERD (gastroesophageal reflux disease)    Headache    Hypertension 06/02/2018   Hypothyroidism    Low back pain    Obesity    Rheumatoid arthritis(714.0)     Past Surgical History:  Procedure Laterality Date   bilateral foot surgery     COLONOSCOPY  2011   Dr. Darrick Penna: 3mm sessile polyp (tubular adenoma), pancolonic diverticulosis   COLONOSCOPY N/A 09/26/2017   diverticulosis, redundant colon   COLONOSCOPY WITH PROPOFOL N/A 05/01/2022   Procedure: COLONOSCOPY WITH PROPOFOL;  Surgeon: Lanelle Bal, DO;  Location: [facility];  Service: Endoscopy;  Laterality: N/A;  11:30am   ESOPHAGOGASTRODUODENOSCOPY N/A 09/16/2017   Dr. Darrick Penna: multiple gastric polyps, (fundic gland), mild  gastritis, path with duodenal mucosa with focal villous blunting and surface erosions, moderate chronic gastritis with intestinal metaplasia and reactive changes. no H.pylori, dysplasia, or malignancy   FLEXIBLE SIGMOIDOSCOPY N/A 09/16/2017   Procedure: FLEXIBLE SIGMOIDOSCOPY;  Surgeon: West Bali, MD;  Location: [facility];  Service: Endoscopy;  Laterality: N/A;   HEMORRHOID SURGERY  1980   KNEE ARTHROSCOPY WITH LATERAL MENISECTOMY Right 10/16/2018   Procedure: KNEE ARTHROSCOPY WITH LATERAL MENISCECTOMY;  Surgeon: Vickki Hearing, MD;  Location: [facility];  Service: Orthopedics;  Laterality: Right;   OOPHORECTOMY Right    roophorectomy for benign disease  02/2010   Dr. Emelda Fear   tonsillectomy and adenoidctomy in childhood  TOTAL KNEE ARTHROPLASTY Right 05/28/2023   Procedure: TOTAL KNEE ARTHROPLASTY;  Surgeon: Vickki Hearing, MD;  Location: [facility];  Service: Orthopedics;  Laterality: Right;   TUBAL LIGATION  1977    Family History  Problem Relation Age of Onset   Heart failure Mother    Cancer Mother 70       oral   Heart failure Father    Arthritis Sister    Heart attack Brother    Thyroid disease Daughter    Hypertension Daughter    Hypertension Daughter    Thyroid disease Daughter    Heart failure Sister    Diabetes Sister    Hypertension Sister    Leukemia Brother    Colon cancer Neg Hx    Colon polyps Neg Hx     Allergies as of 11/20/2023   (No Known Allergies)    Social History   Socioeconomic History   Marital status: Widowed    Spouse name: Not on file   Number of children: 3   Years of education: Not on file   Highest education level: Not on file  Occupational History   Occupation: retired    Comment: part time    Occupation: disabled for arthritis     Comment: since age 75    Employer: [employer]  Tobacco Use   Smoking status: Former    Current packs/day: 0.00    Average packs/day: 2.0 packs/day for 25.0 years (50.0 ttl pk-yrs)     Types: Cigarettes    Start date: 05/12/1989    Quit date: 05/12/2014    Years since quitting: 9.5   Smokeless tobacco: Never  Vaping Use   Vaping status: Never Used  Substance and Sexual Activity   Alcohol use: No   Drug use: No   Sexual activity: Not Currently  Other Topics Concern   Not on file  Social History Narrative   Lives with Granddaughter since 2006   Social Determinants of Health   Financial Resource Strain: Low Risk  (09/06/2022)   Overall Financial Resource Strain (CARDIA)    Difficulty of Paying Living Expenses: Not hard at all  Food Insecurity: No Food Insecurity (05/28/2023)   Hunger Vital Sign    Worried About Running Out of Food in the Last Year: Never true    Ran Out of Food in the Last Year: Never true  Transportation Needs: No Transportation Needs (05/28/2023)   PRAPARE - Administrator, Civil Service (Medical): No    Lack of Transportation (Non-Medical): No  Physical Activity: Inactive (09/06/2022)   Exercise Vital Sign    Days of Exercise per Week: 0 days    Minutes of Exercise per Session: 0 min  Stress: No Stress Concern Present (09/06/2022)   [employer] of Occupational Health - Occupational Stress Questionnaire    Feeling of Stress : Not at all  Social Connections: Moderately Isolated (09/06/2022)   Social Connection and Isolation Panel [NHANES]    Frequency of Communication with Friends and Family: More than three times a week    Frequency of Social Gatherings with Friends and Family: More than three times a week    Attends Religious Services: More than 4 times per year    Active Member of [employer] or Organizations: No    Attends Banker Meetings: Never    Marital Status: Widowed     Review of Systems   Gen: Denies fever, chills, anorexia. Denies fatigue, weakness, weight loss.  CV: Denies chest pain,  palpitations, syncope, peripheral edema, and claudication. Resp: Denies dyspnea at rest, cough, wheezing, coughing up  blood, and pleurisy. GI: See HPI Derm: Denies rash, itching, dry skin Psych: Denies depression, anxiety, memory loss, confusion. No homicidal or suicidal ideation.  Heme: Denies bruising, bleeding, and enlarged lymph nodes.  Physical Exam   There were no vitals taken for this visit.  General:   Alert and oriented. No distress noted. Pleasant and cooperative.  Head:  Normocephalic and atraumatic. Eyes:  Conjuctiva clear without scleral icterus. Mouth:  Oral mucosa pink and moist. Good dentition. No lesions. Lungs:  Clear to auscultation bilaterally. No wheezes, rales, or rhonchi. No distress.  Heart:  S1, S2 present without murmurs appreciated.  Abdomen:  +BS, soft, non-tender and non-distended. No rebound or guarding. No HSM or masses noted. Rectal: *** Msk:  Symmetrical without gross deformities. Normal posture. Extremities:  Without edema. Neurologic:  Alert and  oriented x4 Psych:  Alert and cooperative. Normal mood and affect.  Assessment  Bianca Mcguire is a 77 y.o. female with a history of anemia, arthritis, GERD, HTN, carpal tunnel presenting today for follow-up ***  GERD:   Dysphagia:   PLAN   ***     Brooke Bonito, MSN, FNP-BC, AGACNP-BC [facility]

## 2023-11-20 ENCOUNTER — Ambulatory Visit: Payer: 59 | Admitting: Gastroenterology

## 2023-11-20 ENCOUNTER — Encounter: Payer: Self-pay | Admitting: Gastroenterology

## 2023-11-26 ENCOUNTER — Ambulatory Visit (HOSPITAL_COMMUNITY)
Admission: RE | Admit: 2023-11-26 | Discharge: 2023-11-26 | Disposition: A | Discharge status: Home / Self Care | Payer: 59 | Source: Ambulatory Visit | Attending: Internal Medicine | Admitting: Internal Medicine

## 2023-11-26 ENCOUNTER — Other Ambulatory Visit: Payer: Self-pay | Admitting: Family Medicine

## 2023-11-26 DIAGNOSIS — R059 Cough, unspecified: Secondary | ICD-10-CM | POA: Diagnosis not present

## 2023-11-26 DIAGNOSIS — R053 Chronic cough: Secondary | ICD-10-CM | POA: Insufficient documentation

## 2023-12-05 ENCOUNTER — Other Ambulatory Visit: Payer: Self-pay | Admitting: Family Medicine

## 2023-12-22 NOTE — Progress Notes (Deleted)
 Bianca Mcguire, female    DOB: 31-Jul-1946    MRN: [id number]   Brief patient profile:  6  yowf  quit smoking 05/2014 with pna as child /sinus and ear problems  referred to pulmonary clinic in [place]  11/18/2023 by Dr Bianca for cough onset insidious x 2022 not related to covid to her knowledge  no better or prednisone  or symbicort  by her hx     History of Present Illness  11/18/2023  Pulmonary/ 1st office eval/ Bianca Mcguire / [place] [facility]  Chief Complaint  Patient presents with   Establish Care   Cough  Dyspnea:  maybe 150 ft to MB flat at nl pace ok  Cough: at hs almost immediate slt yellowish and first thing in am and then all day smoldering s obvious assoc nasal symptoms  Sleep: flat bed/ 2 pillows  SABA use: may help cough some  02: none  Rec Stop fosamax  for now  Continue Omeprazole  40 mg Take 30-60 min before first meal of the day and take Pepcid  20 mg (over the counter) after supper . Plan A = Automatic = Always=    symbicort  160 (change to 80) Take 2 puffs first thing in am and then another 2 puffs about 12 hours later. (Breztri  one twice daily)  Work on inhaler technique:    Plan B = Backup (to supplement plan A, not to replace it) Only use your albuterol  inhaler as a rescue medication Plan C = Crisis (instead of Plan B but only if Plan B stops working) - only use your albuterol  nebulizer if you first try Plan B and it fails to help > ok to use the nebulizer up to every 4 hours but if start needing it regularly call for immediate appointment Prednisone  10 mg 2 daily until better then 1 daily x 5 days (take with breakfast)  For cough/ congestion >   mucinex  dm  up to maximum of  1200 mg every 12 hours as needed  Please schedule a follow up office visit in 4 weeks, sooner if needed - bring all inhalers   Late add:   needs cxr / ct sinus also ordered    12/23/2023  f/u ov/[place] office/Bianca Mcguire re: *** maint on ***  No chief complaint on file.   Dyspnea:  *** Cough:  *** Sleeping: ***   resp cc  SABA use: *** 02: ***  Lung cancer screening: ***   No obvious day to day or daytime variability or assoc excess/ purulent sputum or mucus plugs or hemoptysis or cp or chest tightness, subjective wheeze or overt sinus or hb symptoms.    Also denies any obvious fluctuation of symptoms with weather or environmental changes or other aggravating or alleviating factors except as outlined above   No unusual exposure hx or h/o childhood pna/ asthma or knowledge of premature birth.  Current Allergies, Complete Past Medical History, Past Surgical History, Family History, and Social History were reviewed in [name] record.  ROS  The following are not active complaints unless bolded Hoarseness, sore throat, dysphagia, dental problems, itching, sneezing,  nasal congestion or discharge of excess mucus or purulent secretions, ear ache,   fever, chills, sweats, unintended wt loss or wt gain, classically pleuritic or exertional cp,  orthopnea pnd or arm/hand swelling  or leg swelling, presyncope, palpitations, abdominal pain, anorexia, nausea, vomiting, diarrhea  or change in bowel habits or change in bladder habits, change in stools or change in urine, dysuria, hematuria,  rash, arthralgias, visual complaints, headache, numbness, weakness or ataxia or problems with walking or coordination,  change in mood or  memory.        No outpatient medications have been marked as taking for the 12/23/23 encounter (Appointment) with Bianca Ozell NOVAK, Mcguire.   Current Facility-Administered Medications for the 12/23/23 encounter (Appointment) with Bianca Ozell NOVAK, Mcguire  Medication   bupivacaine -meloxicam  ER (ZYNRELEF ) injection 400 mg             Past Medical History:  Diagnosis Date   Acute pain of right knee 01/30/2011   Qualifier: Diagnosis of  By: Bianca Mcguire, Bianca Mcguire     Anemia    Arthritis    Carpal tunnel syndrome, bilateral    COPD (chronic obstructive  pulmonary disease) (HCC)    Diverticulosis dx 2011   GERD (gastroesophageal reflux disease)    Headache    Hypertension 06/02/2018   Hypothyroidism    Low back pain    Obesity    Rheumatoid arthritis(714.0)       Objective:     Wt Readings from Last 3 Encounters:  11/18/23 152 lb (68.9 kg)  10/15/23 149 lb (67.6 kg)  08/30/23 152 lb 1.3 oz (69 kg)      Vital signs reviewed  12/23/2023  - Note at rest 02 sats  ***% on ***   General appearance:    ***  edentulous  RA deformities  both hands ***   Assessment

## 2023-12-23 ENCOUNTER — Ambulatory Visit: Payer: 59 | Admitting: Internal Medicine

## 2023-12-24 ENCOUNTER — Ambulatory Visit: Payer: 59 | Admitting: Family Medicine

## 2023-12-24 NOTE — Progress Notes (Signed)
 [facility] referral received. Attempted to reach patient regarding referral but was unable to reach the patient directly. Detailed VM left asking that the patient return my call.

## 2023-12-26 NOTE — Progress Notes (Signed)
Called and left detailed msg on machine with results/recs ok per DPR. I asked her to please call us back and let us know if she is ok with [place] placing ENT referral. Will await her call back.

## 2024-01-03 NOTE — Progress Notes (Signed)
LCS referral received. Attempted to reach patient regarding referral but was unable to reach the patient directly. Detailed VM left asking that the patient return my call.

## 2024-01-05 NOTE — Progress Notes (Unsigned)
Bianca Mcguire, female    DOB: June 30, 1946    MRN: [id number]   Brief patient profile:  62  yowf  quit smoking 05/2014 with pna as child /sinus and ear problems  referred to pulmonary clinic in [place]  11/18/2023 by Dr Lodema Hong for cough onset insidious x 2022 not related to covid to her knowledge  no better or prednisone or symbicort by her hx     History of Present Illness  11/18/2023  Pulmonary/ 1st office eval/ Bianca Mcguire / [place] [facility]  Chief Complaint  Patient presents with   Establish Care   Cough  Dyspnea:  maybe 150 ft to MB flat at nl pace ok  Cough: at hs almost immediate slt yellowish and first thing in am and then all day smoldering s obvious assoc nasal symptoms  Sleep: flat bed/ 2 pillows  SABA use: may help cough some  02: none  Rec Stop fosamax for now  Continue Omeprazole 40 mg Take 30-60 min before first meal of the day and take Pepcid 20 mg (over the counter) after supper . Plan A = Automatic = Always=    symbicort 160 (change to 80) Take 2 puffs first thing in am and then another 2 puffs about 12 hours later. Markus Daft one twice daily)  Work on inhaler technique:    Plan B = Backup (to supplement plan A, not to replace it) Only use your albuterol inhaler as a rescue medication Plan C = Crisis (instead of Plan B but only if Plan B stops working) - only use your albuterol nebulizer if you first try Plan B and it fails to help > ok to use the nebulizer up to every 4 hours but if start needing it regularly call for immediate appointment Prednisone 10 mg 2 daily until better then 1 daily x 5 days (take with breakfast)  For cough/ congestion >   mucinex dm  up to maximum of  1200 mg every 12 hours as needed  Please schedule a follow up office visit in 4 weeks, sooner if needed - bring all inhalers   Late add:   needs cxr / ct sinus also ordered    01/06/2024  f/u ov/[place] office/Neil Brickell re: *** maint on ***  No chief complaint on file.   Dyspnea:  *** Cough:  *** Sleeping: ***   resp cc  SABA use: *** 02: ***  Lung cancer screening: ***   No obvious day to day or daytime variability or assoc excess/ purulent sputum or mucus plugs or hemoptysis or cp or chest tightness, subjective wheeze or overt sinus or hb symptoms.    Also denies any obvious fluctuation of symptoms with weather or environmental changes or other aggravating or alleviating factors except as outlined above   No unusual exposure hx or h/o childhood pna/ asthma or knowledge of premature birth.  Current Allergies, Complete Past Medical History, Past Surgical History, Family History, and Social History were reviewed in [name] record.  ROS  The following are not active complaints unless bolded Hoarseness, sore throat, dysphagia, dental problems, itching, sneezing,  nasal congestion or discharge of excess mucus or purulent secretions, ear ache,   fever, chills, sweats, unintended wt loss or wt gain, classically pleuritic or exertional cp,  orthopnea pnd or arm/hand swelling  or leg swelling, presyncope, palpitations, abdominal pain, anorexia, nausea, vomiting, diarrhea  or change in bowel habits or change in bladder habits, change in stools or change in urine, dysuria, hematuria,  rash, arthralgias, visual complaints, headache, numbness, weakness or ataxia or problems with walking or coordination,  change in mood or  memory.        No outpatient medications have been marked as taking for the 01/06/24 encounter (Appointment) with Nyoka Cowden, MD.   Current Facility-Administered Medications for the 01/06/24 encounter (Appointment) with Nyoka Cowden, MD  Medication   bupivacaine-meloxicam ER (ZYNRELEF) injection 400 mg             Past Medical History:  Diagnosis Date   Acute pain of right knee 01/30/2011   Qualifier: Diagnosis of  By: Lodema Hong MD, Azariah     Anemia    Arthritis    Carpal tunnel syndrome, bilateral    COPD (chronic obstructive  pulmonary disease) (HCC)    Diverticulosis dx 2011   GERD (gastroesophageal reflux disease)    Headache    Hypertension 06/02/2018   Hypothyroidism    Low back pain    Obesity    Rheumatoid arthritis(714.0)       Objective:     Wt Readings from Last 3 Encounters:  11/18/23 152 lb (68.9 kg)  10/15/23 149 lb (67.6 kg)  08/30/23 152 lb 1.3 oz (69 kg)      Vital signs reviewed  01/06/2024  - Note at rest 02 sats  ***% on ***   General appearance:    ***  edentulous  RA deformities  both hands ***   Assessment

## 2024-01-06 ENCOUNTER — Ambulatory Visit (INDEPENDENT_AMBULATORY_CARE_PROVIDER_SITE_OTHER): Payer: 59 | Admitting: Internal Medicine

## 2024-01-06 ENCOUNTER — Ambulatory Visit (HOSPITAL_COMMUNITY)
Admission: RE | Admit: 2024-01-06 | Discharge: 2024-01-06 | Disposition: A | Discharge status: Home / Self Care | Payer: 59 | Source: Ambulatory Visit | Attending: Internal Medicine | Admitting: Internal Medicine

## 2024-01-06 ENCOUNTER — Encounter: Payer: Self-pay | Admitting: Internal Medicine

## 2024-01-06 VITALS — BP 162/78 | HR 81 | Ht 62.0 in | Wt 152.0 lb

## 2024-01-06 DIAGNOSIS — R918 Other nonspecific abnormal finding of lung field: Secondary | ICD-10-CM | POA: Diagnosis not present

## 2024-01-06 DIAGNOSIS — R053 Chronic cough: Secondary | ICD-10-CM

## 2024-01-06 DIAGNOSIS — J449 Chronic obstructive pulmonary disease, unspecified: Secondary | ICD-10-CM | POA: Diagnosis not present

## 2024-01-06 DIAGNOSIS — I517 Cardiomegaly: Secondary | ICD-10-CM | POA: Diagnosis not present

## 2024-01-06 DIAGNOSIS — J4489 Other specified chronic obstructive pulmonary disease: Secondary | ICD-10-CM | POA: Diagnosis not present

## 2024-01-06 MED ORDER — BUDESONIDE 0.25 MG/2ML IN SUSP
RESPIRATORY_TRACT | 12 refills | Status: AC
Start: 2024-01-06 — End: ?

## 2024-01-06 NOTE — Patient Instructions (Addendum)
Please remember to go to the  x-ray department  @  [facility] for your tests - we will call you with the results when they are available     Add pepcid 20 mg (over the counter) at bedtime to see if it helps your morning  cough   Bed blocks would help too  - 6 to 8 inches is fine.  Add budesonide  0.25 mg with the duoneb twice daily  1st thing in am and once in pm - ok to leave off  the symbicort and breztri off for now   My office will be contacting you by phone for referral to ent   - if you don't hear back from my office within one week please call us back or notify us thru MyChart and we'll address it right away.    Please schedule a follow up visit in 3 months but call sooner if needed

## 2024-01-06 NOTE — Assessment & Plan Note (Signed)
Onset ? 2022 / indolent / persistent and always worse at hs / neg resp to abx -  01/06/2024  added hs pepcid 20 mg to see if can help am cough   Overall better x for am cough so can try more aggressive noct gerd rx :  elevating hob/ HS H2 rx and f/u in 3 m, sooner prn          Each maintenance medication was reviewed in detail including emphasizing most importantly the difference between maintenance and prns and under what circumstances the prns are to be triggered using an action plan format where appropriate.  Total time for H and P, chart review, counseling, reviewing hfa/neb device(s) and generating customized AVS unique to this office visit / same day charting  > 30 min

## 2024-01-06 NOTE — Assessment & Plan Note (Signed)
Quit smoking  2015 - 01/06/2024  After extensive coaching inhaler device,  effectiveness =   50% from a baseline near 0 due to hand deformities > changed to duoneb/bud bid instead of breztri  .DDX of  difficult airways management almost all start with A and  include Adherence, Ace Inhibitors, Acid Reflux, Active Sinus Disease, Alpha 1 Antitripsin deficiency, Anxiety masquerading as Airways dz,  ABPA,  Allergy(esp in young), Aspiration (esp in elderly), Adverse effects of meds,  Active smoking or vaping, A bunch of PE's (a small clot burden can't cause this syndrome unless there is already severe underlying pulm or vascular dz with poor reserve) plus two Bs  = Bronchiectasis and Beta blocker use..and one C= CHF  Adherence is always the initial "prime suspect" and is a multilayered concern that requires a "trust but verify" approach in every patient - starting with knowing how to use medications, especially inhalers, correctly, keeping up with refills and understanding the fundamental difference between maintenance and prns vs those medications only taken for a very short course and then stopped and not refilled.  - see hfa teaching > should do better with duoneb plus bud bid, the near equivalent of air supra in this setting/ explained.

## 2024-01-13 NOTE — Progress Notes (Signed)
LCS referral received. Attempted to reach patient regarding referral but was unable to reach the patient directly. Detailed VM left asking that the patient return my call. Due to multiple unsuccessful attempts to reach patient, LCS referral closed at this time.

## 2024-01-22 ENCOUNTER — Encounter (INDEPENDENT_AMBULATORY_CARE_PROVIDER_SITE_OTHER): Payer: Self-pay | Admitting: Otolaryngology

## 2024-01-29 ENCOUNTER — Other Ambulatory Visit: Payer: Self-pay | Admitting: Family Medicine

## 2024-04-06 ENCOUNTER — Ambulatory Visit: Payer: 59 | Admitting: Internal Medicine

## 2024-04-20 ENCOUNTER — Other Ambulatory Visit: Payer: Self-pay | Admitting: Family Medicine

## 2024-05-13 ENCOUNTER — Telehealth: Payer: Self-pay | Admitting: Family Medicine

## 2024-05-13 NOTE — Telephone Encounter (Signed)
 Disability Placard   Noted  Copied Sleeved  Original in PCP box Copy front desk folder

## 2024-05-13 NOTE — Telephone Encounter (Signed)
Given to provider to sign

## 2024-05-14 ENCOUNTER — Other Ambulatory Visit: Payer: Self-pay

## 2024-05-14 ENCOUNTER — Telehealth: Payer: Self-pay | Admitting: Family Medicine

## 2024-05-14 MED ORDER — LEVOTHYROXINE SODIUM 75 MCG PO TABS: 75.0000 ug | ORAL_TABLET | Freq: Every day | ORAL | 2 refills | Status: AC

## 2024-05-14 NOTE — Telephone Encounter (Signed)
 Prescription Request  05/14/2024  LOV: 10/15/2023  What is the name of the medication or equipment? levothyroxine  (SYNTHROID ) 75 MCG tablet   Have you contacted your pharmacy to request a refill? No   Which pharmacy would you like this sent to?   [id number] - [place], [place] - [address] [place] [address] Phone: [phone] Fax: [phone]   Patient notified that their request is being sent to the clinical staff for review and that they should receive a response within 2 business days.   Please advise at [facility] [phone]

## 2024-05-14 NOTE — Telephone Encounter (Signed)
Patient called for pickup

## 2024-05-14 NOTE — Telephone Encounter (Signed)
 Rx sent. Pt informed.

## 2024-05-14 NOTE — Telephone Encounter (Signed)
 Copy made for scan. Original placed up front

## 2024-06-17 ENCOUNTER — Encounter: Payer: Self-pay | Admitting: Family Medicine

## 2024-06-17 ENCOUNTER — Ambulatory Visit: Admitting: Family Medicine

## 2024-06-17 ENCOUNTER — Other Ambulatory Visit (HOSPITAL_COMMUNITY): Payer: Self-pay | Admitting: Family Medicine

## 2024-06-17 VITALS — BP 110/68 | HR 80 | Resp 16 | Ht 62.0 in | Wt 148.1 lb

## 2024-06-17 DIAGNOSIS — I10 Essential (primary) hypertension: Secondary | ICD-10-CM

## 2024-06-17 DIAGNOSIS — K219 Gastro-esophageal reflux disease without esophagitis: Secondary | ICD-10-CM

## 2024-06-17 DIAGNOSIS — E039 Hypothyroidism, unspecified: Secondary | ICD-10-CM | POA: Diagnosis not present

## 2024-06-17 DIAGNOSIS — Z122 Encounter for screening for malignant neoplasm of respiratory organs: Secondary | ICD-10-CM | POA: Diagnosis not present

## 2024-06-17 DIAGNOSIS — R7302 Impaired glucose tolerance (oral): Secondary | ICD-10-CM | POA: Diagnosis not present

## 2024-06-17 DIAGNOSIS — E559 Vitamin D deficiency, unspecified: Secondary | ICD-10-CM

## 2024-06-17 DIAGNOSIS — J449 Chronic obstructive pulmonary disease, unspecified: Secondary | ICD-10-CM | POA: Diagnosis not present

## 2024-06-17 DIAGNOSIS — E785 Hyperlipidemia, unspecified: Secondary | ICD-10-CM

## 2024-06-17 DIAGNOSIS — Z1231 Encounter for screening mammogram for malignant neoplasm of breast: Secondary | ICD-10-CM

## 2024-06-17 DIAGNOSIS — M05749 Rheumatoid arthritis with rheumatoid factor of unspecified hand without organ or systems involvement: Secondary | ICD-10-CM

## 2024-06-17 NOTE — Patient Instructions (Addendum)
 F/U in 5 months  Please scherdue mammogram at checkout  You  need lung screening , please listen for call and  get this scheduled  Labs today, cBC, TSH, lipid, cmp and eGFR, free T4 and vit D and hBA1C  Pls schedule AWV at checkout  You are being referred to Dr Darlean and to your Rheumatologist, visits are past due  Do two neb treatments twice daily as per Dr Darlean  Careful not to fall  Thanks for choosing [facility], we consider it a privelige to serve you.

## 2024-06-18 ENCOUNTER — Ambulatory Visit: Payer: Self-pay | Admitting: Family Medicine

## 2024-06-18 LAB — CMP14+EGFR
ALT: 10 IU/L (ref 0–32)
AST: 16 IU/L (ref 0–40)
Albumin: 4.2 g/dL (ref 3.8–4.8)
Alkaline Phosphatase: 126 IU/L — ABNORMAL HIGH (ref 44–121)
BUN/Creatinine Ratio: 28 (ref 12–28)
BUN: 28 mg/dL — ABNORMAL HIGH (ref 8–27)
Bilirubin Total: <0.2 mg/dL (ref 0.0–1.2)
CO2: 20 mmol/L (ref 20–29)
Calcium: 9.3 mg/dL (ref 8.7–10.3)
Chloride: 105 mmol/L (ref 96–106)
Creatinine, Ser: 0.99 mg/dL (ref 0.57–1.00)
Globulin, Total: 2.8 g/dL (ref 1.5–4.5)
Glucose: 85 mg/dL (ref 70–99)
Potassium: 4.4 mmol/L (ref 3.5–5.2)
Sodium: 142 mmol/L (ref 134–144)
Total Protein: 7 g/dL (ref 6.0–8.5)
eGFR: 59 mL/min/1.73 — ABNORMAL LOW (ref >59)

## 2024-06-18 LAB — CBC WITH DIFFERENTIAL/PLATELET
Basophils Absolute: 0 x10E3/uL (ref 0.0–0.2)
Basos: 1 %
EOS (ABSOLUTE): 0.2 x10E3/uL (ref 0.0–0.4)
Eos: 2 %
Hematocrit: 39.7 % (ref 34.0–46.6)
Hemoglobin: 12.2 g/dL (ref 11.1–15.9)
Immature Grans (Abs): 0.1 x10E3/uL (ref 0.0–0.1)
Immature Granulocytes: 1 %
Lymphocytes Absolute: 2.5 x10E3/uL (ref 0.7–3.1)
Lymphs: 35 %
MCH: 26.1 pg — ABNORMAL LOW (ref 26.6–33.0)
MCHC: 30.7 g/dL — ABNORMAL LOW (ref 31.5–35.7)
MCV: 85 fL (ref 79–97)
Monocytes Absolute: 0.5 x10E3/uL (ref 0.1–0.9)
Monocytes: 7 %
Neutrophils Absolute: 4 x10E3/uL (ref 1.4–7.0)
Neutrophils: 54 %
Platelets: 242 x10E3/uL (ref 150–450)
RBC: 4.67 x10E6/uL (ref 3.77–5.28)
RDW: 16.2 % — ABNORMAL HIGH (ref 11.7–15.4)
WBC: 7.3 x10E3/uL (ref 3.4–10.8)

## 2024-06-18 LAB — LIPID PANEL
Chol/HDL Ratio: 2.9 ratio (ref 0.0–4.4)
Cholesterol, Total: 187 mg/dL (ref 100–199)
HDL: 65 mg/dL (ref >39)
LDL Chol Calc (NIH): 104 mg/dL — ABNORMAL HIGH (ref 0–99)
Triglycerides: 101 mg/dL (ref 0–149)
VLDL Cholesterol Cal: 18 mg/dL (ref 5–40)

## 2024-06-18 LAB — HEMOGLOBIN A1C
Est. average glucose Bld gHb Est-mCnc: 114 mg/dL
Hgb A1c MFr Bld: 5.6 % (ref 4.8–5.6)

## 2024-06-18 LAB — VITAMIN D 25 HYDROXY (VIT D DEFICIENCY, FRACTURES): Vit D, 25-Hydroxy: 70.5 ng/mL (ref 30.0–100.0)

## 2024-06-18 LAB — TSH+FREE T4
Free T4: 1.07 ng/dL (ref 0.82–1.77)
TSH: 1.02 u[IU]/mL (ref 0.450–4.500)

## 2024-06-20 NOTE — Assessment & Plan Note (Addendum)
 Needs to return to Pulmonary for re assesment and management

## 2024-06-20 NOTE — Assessment & Plan Note (Signed)
 Hyperlipidemia:Low fat diet discussed and encouraged.   Lipid Panel  Lab Results  Component Value Date   CHOL 187 06/17/2024   HDL 65 06/17/2024   LDLCALC 104 (H) 06/17/2024   TRIG 101 06/17/2024   CHOLHDL 2.9 06/17/2024     Needs to lower fat intake , no change in management

## 2024-06-20 NOTE — Assessment & Plan Note (Signed)
 Controlled, no change in medication DASH diet and commitment to daily physical activity for a minimum of 30 minutes discussed and encouraged, as a part of hypertension management. The importance of attaining a healthy weight is also discussed.     06/17/2024    2:41 PM 01/06/2024   10:00 AM 11/18/2023   10:17 AM 10/15/2023    9:10 AM 08/30/2023    8:58 AM 08/30/2023    8:37 AM 08/30/2023    8:36 AM  BP/Weight  Systolic BP 110 162 116 129 160 153 162  Diastolic BP 68 78 75 78 80 84 80  Wt. (Lbs) 148.12 152 152 149   152.08  BMI 27.09 kg/m2 27.8 kg/m2 27.8 kg/m2 27.25 kg/m2   27.82 kg/m2

## 2024-06-20 NOTE — Progress Notes (Signed)
 Bianca Mcguire     MRN: [id number]      DOB: 21-May-1946  Chief Complaint  Patient presents with   Hypertension    Follow up     HPI Bianca Mcguire is here for follow up and re-evaluation of chronic medical conditions, medication management and review of any available recent lab and radiology data.  Preventive health is updated, specifically  Cancer screening and Immunization.   Questions or concerns regarding consultations or procedures which the PT has had in the interim are  addressed. The PT denies any adverse reactions to current medications since the last visit.  There are no new concerns.  There are no specific complaints   ROS Denies recent fever or chills. Denies sinus pressure, nasal congestion, ear pain or sore throat. Denies chest congestion, productive cough or wheezing. Denies chest pains, palpitations and leg swelling Denies abdominal pain, nausea, vomiting,diarrhea or constipation.   Denies dysuria, frequency, hesitancy or incontinence. Chronic  joint pain, swelling and limitation in mobility. Denies headaches, seizures, numbness, or tingling. Denies depression, anxiety or insomnia. Denies skin break down or rash.   PE  BP 110/68   Pulse 80   Resp 16   Ht 5' 2 (1.575 m)   Wt 148 lb 1.9 oz (67.2 kg)   SpO2 93%   BMI 27.09 kg/m   Patient alert and oriented and in no cardiopulmonary distress.  HEENT: No facial asymmetry, EOMI,     Neck supple .  Chest: decreased air entry, no whezzes , few absila crackles CVS: S1, S2 no murmurs, no S3.Regular rate.  ABD: Soft non tender.   Ext: No edema  MS: Adequate ROM spine, shoulders, hips and knees.  Skin: Intact, no ulcerations or rash noted.  Psych: Good eye contact, normal affect. Memory intact not anxious or depressed appearing.  CNS: CN 2-12 intact, power,  normal throughout.no focal deficits noted.   Assessment & Plan  Rheumatoid arthritis (HCC) Follow up with rheumatology is past due , referral   to be sent  Essential hypertension Controlled, no change in medication DASH diet and commitment to daily physical activity for a minimum of 30 minutes discussed and encouraged, as a part of hypertension management. The importance of attaining a healthy weight is also discussed.     06/17/2024    2:41 PM 01/06/2024   10:00 AM 11/18/2023   10:17 AM 10/15/2023    9:10 AM 08/30/2023    8:58 AM 08/30/2023    8:37 AM 08/30/2023    8:36 AM  BP/Weight  Systolic BP 110 162 116 129 160 153 162  Diastolic BP 68 78 75 78 80 84 80  Wt. (Lbs) 148.12 152 152 149   152.08  BMI 27.09 kg/m2 27.8 kg/m2 27.8 kg/m2 27.25 kg/m2   27.82 kg/m2       Hyperlipidemia Hyperlipidemia:Low fat diet discussed and encouraged.   Lipid Panel  Lab Results  Component Value Date   CHOL 187 06/17/2024   HDL 65 06/17/2024   LDLCALC 104 (H) 06/17/2024   TRIG 101 06/17/2024   CHOLHDL 2.9 06/17/2024     Needs to lower fat intake , no change in management  GERD (gastroesophageal reflux disease) Controlled, no change in medication   COPD (chronic obstructive pulmonary disease) (HCC) Needs to return to Pulmonary for re assesment and management  IGT (impaired glucose tolerance) Patient educated about the importance of limiting  Carbohydrate intake , the need to commit to daily physical activity for a minimum of  30 minutes , and to commit weight loss. The fact that changes in all these areas will reduce or eliminate all together the development of diabetes is stressed.  Improved and in normal range, great     Latest Ref Rng & Units 06/17/2024    3:36 PM 10/15/2023   10:19 AM 08/30/2023    9:17 AM 05/29/2023    4:46 AM 05/21/2023    3:49 PM  Diabetic Labs  HbA1c 4.8 - 5.6 % 5.6  5.8      Chol 100 - 199 mg/dL 812       HDL >60 mg/dL 65       Calc LDL 0 - 99 mg/dL 895       Triglycerides 0 - 149 mg/dL 898       Creatinine 9.42 - 1.00 mg/dL 9.00   9.28  9.44  9.33       06/17/2024    2:41 PM 01/06/2024   10:00  AM 11/18/2023   10:17 AM 10/15/2023    9:10 AM 08/30/2023    8:58 AM 08/30/2023    8:37 AM 08/30/2023    8:36 AM  BP/Weight  Systolic BP 110 162 116 129 160 153 162  Diastolic BP 68 78 75 78 80 84 80  Wt. (Lbs) 148.12 152 152 149   152.08  BMI 27.09 kg/m2 27.8 kg/m2 27.8 kg/m2 27.25 kg/m2   27.82 kg/m2      Latest Ref Rng & Units 11/25/2015    8:41 AM  Foot/eye exam completion dates  Eye Exam No Retinopathy No Retinopathy         This result is from an external source.      Adult hypothyroidism Controlled, no change in medication

## 2024-06-20 NOTE — Assessment & Plan Note (Signed)
 Controlled, no change in medication

## 2024-06-20 NOTE — Assessment & Plan Note (Addendum)
 Patient educated about the importance of limiting  Carbohydrate intake , the need to commit to daily physical activity for a minimum of 30 minutes , and to commit weight loss. The fact that changes in all these areas will reduce or eliminate all together the development of diabetes is stressed.  Improved and in normal range, great     Latest Ref Rng & Units 06/17/2024    3:36 PM 10/15/2023   10:19 AM 08/30/2023    9:17 AM 05/29/2023    4:46 AM 05/21/2023    3:49 PM  Diabetic Labs  HbA1c 4.8 - 5.6 % 5.6  5.8      Chol 100 - 199 mg/dL 812       HDL >60 mg/dL 65       Calc LDL 0 - 99 mg/dL 895       Triglycerides 0 - 149 mg/dL 898       Creatinine 9.42 - 1.00 mg/dL 9.00   9.28  9.44  9.33       06/17/2024    2:41 PM 01/06/2024   10:00 AM 11/18/2023   10:17 AM 10/15/2023    9:10 AM 08/30/2023    8:58 AM 08/30/2023    8:37 AM 08/30/2023    8:36 AM  BP/Weight  Systolic BP 110 162 116 129 160 153 162  Diastolic BP 68 78 75 78 80 84 80  Wt. (Lbs) 148.12 152 152 149   152.08  BMI 27.09 kg/m2 27.8 kg/m2 27.8 kg/m2 27.25 kg/m2   27.82 kg/m2      Latest Ref Rng & Units 11/25/2015    8:41 AM  Foot/eye exam completion dates  Eye Exam No Retinopathy No Retinopathy         This result is from an external source.

## 2024-06-20 NOTE — Assessment & Plan Note (Addendum)
 Follow up with rheumatology is past due , referral  to be sent

## 2024-07-09 ENCOUNTER — Encounter: Payer: Self-pay | Admitting: Orthopedic Surgery

## 2024-07-09 ENCOUNTER — Ambulatory Visit: Admitting: Orthopedic Surgery

## 2024-07-09 ENCOUNTER — Other Ambulatory Visit (INDEPENDENT_AMBULATORY_CARE_PROVIDER_SITE_OTHER): Payer: Self-pay

## 2024-07-09 DIAGNOSIS — M1711 Unilateral primary osteoarthritis, right knee: Secondary | ICD-10-CM

## 2024-07-09 DIAGNOSIS — Z96651 Presence of right artificial knee joint: Secondary | ICD-10-CM

## 2024-07-09 NOTE — Progress Notes (Signed)
 ANNUAL FOLLOW UP FOR  RIGHT  TKA   Chief Complaint  Patient presents with   Post-op Follow-up    RIGHT      HPI: The patient is here for the annual  follow-up x-ray for knee replacement. The patient is not complaining of pain weakness instability or stiffness in the repaired knee.   ROS mild left knee pain, NOT READY FOR SURGERY YET      Examination of the right  KNEE  There were no vitals taken for this visit. General the patient is normally groomed in no distress Inspection shows : incision healed nicely without erythema, no tenderness no swelling Range of motion total range of motion is 120 Stability the knee is stable anterior to posterior as well as medial to lateral Strength quadriceps strength is normal Skin no erythema around the skin incision Neuro: normal sensation in the operative leg  Gait: normal expected gait without cane    Medical decision-making section X-rays ordered, internal imaging shows (see full dictated report) stable implant with no signs of loosening  Diagnosis  Encounter Diagnoses  Name Primary?   S/P total knee replacement, right 05/28/23 Yes   Unilateral primary osteoarthritis, right knee      Plan return if LEFT KNEE IS PROBLEMATIC

## 2024-07-09 NOTE — Progress Notes (Signed)
   There were no vitals taken for this visit.  There is no height or weight on file to calculate BMI.  Chief Complaint  Patient presents with   Post-op Follow-up    RIGHT     Encounter Diagnosis  Name Primary?   S/P total knee replacement, right 05/28/23 Yes    DOI/DOS/ Date: 05/28/23  Improved

## 2024-07-13 DIAGNOSIS — M79642 Pain in left hand: Secondary | ICD-10-CM | POA: Diagnosis not present

## 2024-07-13 DIAGNOSIS — M79641 Pain in right hand: Secondary | ICD-10-CM | POA: Diagnosis not present

## 2024-07-13 DIAGNOSIS — M1991 Primary osteoarthritis, unspecified site: Secondary | ICD-10-CM | POA: Diagnosis not present

## 2024-07-13 DIAGNOSIS — M0579 Rheumatoid arthritis with rheumatoid factor of multiple sites without organ or systems involvement: Secondary | ICD-10-CM | POA: Diagnosis not present

## 2024-07-13 DIAGNOSIS — Z79899 Other long term (current) drug therapy: Secondary | ICD-10-CM | POA: Diagnosis not present

## 2024-07-20 ENCOUNTER — Ambulatory Visit (HOSPITAL_COMMUNITY)

## 2024-07-23 ENCOUNTER — Other Ambulatory Visit: Payer: Self-pay | Admitting: Family Medicine

## 2024-08-05 ENCOUNTER — Ambulatory Visit (INDEPENDENT_AMBULATORY_CARE_PROVIDER_SITE_OTHER)

## 2024-08-05 ENCOUNTER — Encounter: Payer: Self-pay | Admitting: Podiatry

## 2024-08-05 ENCOUNTER — Ambulatory Visit (INDEPENDENT_AMBULATORY_CARE_PROVIDER_SITE_OTHER): Admitting: Podiatry

## 2024-08-05 VITALS — Ht 62.0 in | Wt 148.0 lb

## 2024-08-05 DIAGNOSIS — M7750 Other enthesopathy of unspecified foot: Secondary | ICD-10-CM

## 2024-08-05 DIAGNOSIS — M79674 Pain in right toe(s): Secondary | ICD-10-CM | POA: Diagnosis not present

## 2024-08-05 DIAGNOSIS — B351 Tinea unguium: Secondary | ICD-10-CM

## 2024-08-05 DIAGNOSIS — M7751 Other enthesopathy of right foot: Secondary | ICD-10-CM

## 2024-08-05 DIAGNOSIS — M79675 Pain in left toe(s): Secondary | ICD-10-CM | POA: Diagnosis not present

## 2024-08-05 NOTE — Progress Notes (Signed)
 Subjective:   Patient ID: Bianca Mcguire, female   DOB: 78 y.o.   MRN: [id number]   HPI Patient presents with nail disease 1-5 both feet that can become painful and inflammation fluid of the dorsum of the right foot which she had drained which was not successful but not currently tender to an extensive amount.  Patient does not smoke likes to be active   Review of Systems  All other systems reviewed and are negative.       Objective:  Physical Exam Vitals and nursing note reviewed.  Constitutional:      Appearance: She is well-developed.  Pulmonary:     Effort: Pulmonary effort is normal.  Musculoskeletal:        General: Normal range of motion.  Skin:    General: Skin is warm.  Neurological:     Mental Status: She is alert.     Neurovascular status was found to be intact bilateral muscle strength found to be adequate range of motion adequate with patient found to have inflammation dorsal aspect right foot with fluid buildup around the midtarsal joint and elongated nailbeds 1-5 both feet that are thickened hard for her to cut and at times can be ingrown in the corners.  Good digital perfusion well-oriented x 3     Assessment:  Inflammatory condition dorsal right foot with possibility for spur formation arthritis or ganglionic with night nail disease 1-5 both feet painful when pressed     Plan:  Reviewed all conditions in H&P do not recommend treatment per asymptomatic area and today I went ahead sterile sharp debridement nailbeds 1-5 both feet no iatrogenic bleeding reappoint routine care  X-rays indicate there is slight arthritis of the midtarsal joint right no enhanced spur formation noted

## 2024-08-06 ENCOUNTER — Other Ambulatory Visit: Payer: Self-pay | Admitting: Family Medicine

## 2024-08-11 ENCOUNTER — Ambulatory Visit (INDEPENDENT_AMBULATORY_CARE_PROVIDER_SITE_OTHER): Admitting: Orthopaedic Surgery

## 2024-08-11 ENCOUNTER — Other Ambulatory Visit: Payer: Self-pay

## 2024-08-11 ENCOUNTER — Encounter: Payer: Self-pay | Admitting: Orthopaedic Surgery

## 2024-08-11 ENCOUNTER — Other Ambulatory Visit (INDEPENDENT_AMBULATORY_CARE_PROVIDER_SITE_OTHER)

## 2024-08-11 DIAGNOSIS — M79641 Pain in right hand: Secondary | ICD-10-CM | POA: Diagnosis not present

## 2024-08-11 DIAGNOSIS — M79642 Pain in left hand: Secondary | ICD-10-CM | POA: Diagnosis not present

## 2024-08-11 NOTE — Progress Notes (Signed)
 Office Visit Note   Patient: Bianca Mcguire           Date of Birth: 1946/03/22           MRN: [id number] Visit Date: 08/11/2024              Requested by: Antonetta Rollene BRAVO, MD [address], [address] [place],  [place] [address] PCP: Antonetta Rollene BRAVO, MD   Assessment & Plan: Visit Diagnoses:  1. Bilateral hand pain     Plan: History of Present Illness Bianca Mcguire is a 78 year old female with rheumatoid arthritis who presents with severe joint involvement and nail discoloration.  She experiences significant joint pain, particularly during activities such as driving. Her current medications include Celebrex  and methotrexate, with methotrexate administered weekly. She has undergone a knee replacement in the past. There is a yellowish discoloration of her nails, sparing the little finger, noted for about a year.  Physical Exam MUSCULOSKELETAL: Classic appearance of rheumatoid hand with ulnar deviation of the fingers at the MCP joint.  Assessment and Plan Severe right hand rheumatoid arthritis Severe joint involvement with classic rheumatoid hand appearance. Significant pain, especially during activities. Condition complex and beyond current management. - Refer to Dr. Bethanne, hand specialist, for evaluation and discussion of potential joint replacement options.  Yellow discoloration of fingernails (possible nail dystrophy) Yellow discoloration affecting all fingers except the small finger. Possible relation to rheumatoid arthritis, unconfirmed. - Discuss with Dr. Bethanne during the hand specialist appointment for further evaluation.  Follow-Up Instructions: No follow-ups on file.   Orders:  Orders Placed This Encounter  Procedures   XR Hand Complete Left   XR Hand Complete Right   No orders of the defined types were placed in this encounter.     Subjective: Chief Complaint  Patient presents with   Right Hand - Pain   Left Hand - Pain    HPI  Review  of Systems  Constitutional: Negative.   HENT: Negative.    Eyes: Negative.   Respiratory: Negative.    Cardiovascular: Negative.   Endocrine: Negative.   Musculoskeletal: Negative.   Neurological: Negative.   Hematological: Negative.   Psychiatric/Behavioral: Negative.    All other systems reviewed and are negative.    Objective: Vital Signs: There were no vitals taken for this visit.  Physical Exam Vitals and nursing note reviewed.  Constitutional:      Appearance: She is well-developed.  HENT:     Head: Atraumatic.     Nose: Nose normal.  Eyes:     Extraocular Movements: Extraocular movements intact.  Cardiovascular:     Pulses: Normal pulses.  Pulmonary:     Effort: Pulmonary effort is normal.  Abdominal:     Palpations: Abdomen is soft.  Musculoskeletal:     Cervical back: Neck supple.  Skin:    General: Skin is warm.     Capillary Refill: Capillary refill takes less than 2 seconds.  Neurological:     Mental Status: She is alert. Mental status is at baseline.  Psychiatric:        Behavior: Behavior normal.        Thought Content: Thought content normal.        Judgment: Judgment normal.     Ortho Exam  Specialty Comments:  No specialty comments available.  Imaging: XR Hand Complete Right Result Date: 08/11/2024 X-rays of the right hand show destructive degenerative changes of the MCP joints with ulnar deviation.  XR  Hand Complete Left Result Date: 08/11/2024 X-rays of the left hand show destructive degenerative changes at the MCP joints consistent with rheumatoid arthritis.    PMFS History: Patient Active Problem List   Diagnosis Date Noted   Asthmatic bronchitis , chronic (HCC) 01/06/2024   Chronic cough 10/15/2023   GERD (gastroesophageal reflux disease) 08/30/2023   S/P total knee replacement, right 05/28/23 06/07/2023   Arthritis of knee, right 05/28/2023   Abnormal CT scan, chest 04/21/2023   Bilateral chronic knee pain 04/19/2023   Annual  visit for general adult medical examination with abnormal findings 04/19/2023   Mixed simple and mucopurulent chronic bronchitis (HCC) 03/01/2023   Dysphagia 03/01/2023   Hoarseness of voice 03/01/2023   COPD with acute exacerbation (HCC) 01/30/2023   Positive colorectal cancer screening using Cologuard test 03/27/2022   Herpes zoster 03/15/2022   Reduced vision 10/26/2021   Nausea 09/04/2021   COPD (chronic obstructive pulmonary disease) (HCC) 04/12/2021   Knee pain, bilateral 05/10/2020   Allergic rhinitis 05/10/2020   S/P arthroscopy of right knee 10/16/18 10/23/2018   Meniscus, lateral, derangement, right    Primary osteoarthritis of right knee    Back spasm 08/11/2018   Essential hypertension 06/02/2018   Anemia    Normocytic anemia, not due to blood loss 07/26/2017   Vitamin B12 deficiency 07/26/2017   Shoulder pain, left 05/22/2017   Encounter for immunization 01/12/2017   Tubular adenoma of colon 07/17/2015   H/O nicotine dependence 07/17/2015   Unsteady gait 07/05/2014   Back pain with right-sided radiculopathy 06/07/2014   Osteoporosis 04/21/2014   IGT (impaired glucose tolerance) 09/20/2013   Adult hypothyroidism 02/02/2011   Hyperlipidemia 05/03/2010   SHOULDER PAIN, LEFT 05/03/2010   Vitamin D  deficiency 01/10/2010   OSTEOARTHRITIS, KNEES, BILATERAL 01/10/2010   Fatigue 01/10/2010   Nephrolithiasis, left 01/10/2010   HAND PAIN, BILATERAL 11/17/2008   VAGINITIS, ATROPHIC 10/05/2008   CARPAL TUNNEL SYNDROME, BILATERAL 09/07/2008   Rheumatoid arthritis (HCC) 09/07/2008   Low back pain with radiation 09/07/2008   Past Medical History:  Diagnosis Date   Acute pain of right knee 01/30/2011   Qualifier: Diagnosis of  By: Antonetta MD, Azaylea     Anemia    Arthritis    Carpal tunnel syndrome, bilateral    COPD (chronic obstructive pulmonary disease) (HCC)    Diverticulosis dx 2011   GERD (gastroesophageal reflux disease)    Headache    Hypertension 06/02/2018    Hypothyroidism    Low back pain    Obesity    Rheumatoid arthritis(714.0)     Family History  Problem Relation Age of Onset   Heart failure Mother    Cancer Mother 21       oral   Heart failure Father    Arthritis Sister    Heart attack Brother    Thyroid  disease Daughter    Hypertension Daughter    Hypertension Daughter    Thyroid  disease Daughter    Heart failure Sister    Diabetes Sister    Hypertension Sister    Leukemia Brother    Colon cancer Neg Hx    Colon polyps Neg Hx     Past Surgical History:  Procedure Laterality Date   bilateral foot surgery     COLONOSCOPY  2011   Dr. Harvey: 3mm sessile polyp (tubular adenoma), pancolonic diverticulosis   COLONOSCOPY N/A 09/26/2017   diverticulosis, redundant colon   COLONOSCOPY WITH PROPOFOL  N/A 05/01/2022   Procedure: COLONOSCOPY WITH PROPOFOL ;  Surgeon: Cindie Carlin POUR,  DO;  Location: [facility];  Service: Endoscopy;  Laterality: N/A;  11:30am   ESOPHAGOGASTRODUODENOSCOPY N/A 09/16/2017   Dr. Harvey: multiple gastric polyps, (fundic gland), mild gastritis, path with duodenal mucosa with focal villous blunting and surface erosions, moderate chronic gastritis with intestinal metaplasia and reactive changes. no H.pylori, dysplasia, or malignancy   FLEXIBLE SIGMOIDOSCOPY N/A 09/16/2017   Procedure: FLEXIBLE SIGMOIDOSCOPY;  Surgeon: Harvey Margo CROME, MD;  Location: [facility];  Service: Endoscopy;  Laterality: N/A;   HEMORRHOID SURGERY  1980   KNEE ARTHROSCOPY WITH LATERAL MENISECTOMY Right 10/16/2018   Procedure: KNEE ARTHROSCOPY WITH LATERAL MENISCECTOMY;  Surgeon: Margrette Taft BRAVO, MD;  Location: [facility];  Service: Orthopedics;  Laterality: Right;   OOPHORECTOMY Right    roophorectomy for benign disease  02/2010   Dr. Edsel   tonsillectomy and adenoidctomy in childhood     TOTAL KNEE ARTHROPLASTY Right 05/28/2023   Procedure: TOTAL KNEE ARTHROPLASTY;  Surgeon: Margrette Taft BRAVO, MD;  Location: [facility];   Service: Orthopedics;  Laterality: Right;   TUBAL LIGATION  1977   Social History   Occupational History   Occupation: retired    Comment: part time    Occupation: disabled for arthritis     Comment: since age 37    Employer: [employer]  Tobacco Use   Smoking status: Former    Current packs/day: 0.00    Average packs/day: 2.0 packs/day for 25.0 years (50.0 ttl pk-yrs)    Types: Cigarettes    Start date: 05/12/1989    Quit date: 05/12/2014    Years since quitting: 10.2   Smokeless tobacco: Never  Vaping Use   Vaping status: Never Used  Substance and Sexual Activity   Alcohol use: No   Drug use: No   Sexual activity: Not Currently

## 2024-08-17 ENCOUNTER — Other Ambulatory Visit: Payer: Self-pay | Admitting: Family Medicine

## 2024-08-18 ENCOUNTER — Ambulatory Visit (INDEPENDENT_AMBULATORY_CARE_PROVIDER_SITE_OTHER): Admitting: Orthopedic Surgery

## 2024-08-18 ENCOUNTER — Encounter: Payer: Self-pay | Admitting: Rehabilitative and Restorative Service Providers"

## 2024-08-18 ENCOUNTER — Ambulatory Visit (INDEPENDENT_AMBULATORY_CARE_PROVIDER_SITE_OTHER): Admitting: Rehabilitative and Restorative Service Providers"

## 2024-08-18 DIAGNOSIS — M25632 Stiffness of left wrist, not elsewhere classified: Secondary | ICD-10-CM | POA: Diagnosis not present

## 2024-08-18 DIAGNOSIS — M79641 Pain in right hand: Secondary | ICD-10-CM

## 2024-08-18 DIAGNOSIS — M79642 Pain in left hand: Secondary | ICD-10-CM

## 2024-08-18 DIAGNOSIS — M0579 Rheumatoid arthritis with rheumatoid factor of multiple sites without organ or systems involvement: Secondary | ICD-10-CM

## 2024-08-18 DIAGNOSIS — M6281 Muscle weakness (generalized): Secondary | ICD-10-CM

## 2024-08-18 DIAGNOSIS — R278 Other lack of coordination: Secondary | ICD-10-CM

## 2024-08-18 DIAGNOSIS — M25641 Stiffness of right hand, not elsewhere classified: Secondary | ICD-10-CM | POA: Diagnosis not present

## 2024-08-18 DIAGNOSIS — R600 Localized edema: Secondary | ICD-10-CM | POA: Diagnosis not present

## 2024-08-18 NOTE — Progress Notes (Signed)
 Bianca Mcguire - 78 y.o. female MRN [id number]  Date of birth: 06-May-1946  Office Visit Note: Visit Date: 08/18/2024 PCP: Antonetta Rollene BRAVO, MD Referred by: Antonetta Rollene BRAVO, MD  Subjective: No chief complaint on file.  HPI: Bianca Mcguire is a pleasant 78 y.o. female who presents today for ongoing significant end-stage rheumatoid arthritis to the bilateral hands.  She currently takes methotrexate and Celebrex  at baseline.  She states she has not been on disease modifying antirheumatic drugs in the past.  She has significant ulnar deviation at the MCP joints of bilateral hands with ongoing pain diffusely.  States that the pain is worse on the right than the left.  She has been sent to me today by my partner Dr. Jerri for specific hand surgical evaluation.  Pertinent ROS were reviewed with the patient and found to be negative unless otherwise specified above in HPI.   Visit Reason: Right hand rheumatoid arthritis  Duration of symptoms: 2 + years Hand dominance: right Occupation: retired Diabetic: No Smoking: No Heart/Lung History: COPD (chronic obstructive pulmonary disease) (HCC) COPD with acute exacerbation (HCC) Mixed simple and mucopurulent chronic bronchitis (HCC) Asthmatic bronchitis , chronic (HCC)   Blood Thinners:  none  Prior Testing/EMG: x-rays, blood tests Injections (Date): none Treatments: celebrex  and metotrexate  Prior Surgery: none  Assessment & Plan: Visit Diagnoses:  1. Bilateral hand pain   2. Rheumatoid arthritis with rheumatoid factor of multiple sites without organ or systems involvement [facility])     Plan: Extensive discussion was had the patient today regarding her bilateral hand deformities in the setting of end-stage rheumatoid arthritis.  We discussed at length the ongoing destruction at the bilateral wrist joints and the MCP regions.  Particularly on the right side, she has complete ulnar subluxation of the MCP joints with rotational  instability consistent with end-stage rheumatoid changes.  I explained that the MCP joints cannot be addressed until the wrist has undergone appropriate stabilization.  We discussed wrist arthroplasty versus arthrodesis in the future to the right side.  She also has significant nail dystrophy on the right side as compared to the left, this may be related to the ongoing rheumatoid arthritis.  In order for her to gain more understanding of life with a potential arthrodesis, I will send her to occupational therapy for fabrication of her orthosis to utilize for daily activities in the near future.  Should she be able to accommodate for activities with the wrist in a arthrodesed position, she will return to me for further discussion regarding potential right wrist arthrodesis.  I discussed this at length with occupational therapy as well who will see her today.  I also discussed with her that she could inquire with her rheumatologist about potential DMARD in the future.  This may help prevent ongoing joint destruction on the left wrist and hand.  I spent 45 minutes in the care of this patient today including review of previous documentation, imaging obtained, face-to-face time discussing all options regarding treatment and documenting the encounter.   Follow-up: No follow-ups on file.   Meds & Orders: No orders of the defined types were placed in this encounter.   Orders Placed This Encounter  Procedures   Ambulatory referral to Occupational Therapy     Procedures: No procedures performed      Clinical History: No specialty comments available.  She reports that she quit smoking about 10 years ago. Her smoking use included cigarettes. She started smoking about 35 years  ago. She has a 50 pack-year smoking history. She has never used smokeless tobacco.  Recent Labs    10/15/23 1019 06/17/24 1536  HGBA1C 5.8* 5.6    Objective:   Vital Signs: There were no vitals taken for this  visit.  Physical Exam  Gen: Well-appearing, in no acute distress; non-toxic CV: Regular Rate. Well-perfused. Warm.  Resp: Breathing unlabored on room air; no wheezing. Psych: Fluid speech in conversation; appropriate affect; normal thought process  Ortho Exam Right hand with significant ulnar deviation of the MCP joints diffusely, minimal range of motion is achievable, digits and pronated position, sensation is intact distally, significant nail discoloration diffusely throughout the right hand, wrist range of motion flexion/extension 50/35  Left hand with ulnar deviation of the MCP joints as well, range of motion is better than the right, also limited secondary to ulnar deviated position, wrist range of motion flexion/extension 55/45  Imaging: No results found.  Past Medical/Family/Surgical/Social History: Medications & Allergies reviewed per EMR, new medications updated. Patient Active Problem List   Diagnosis Date Noted   Asthmatic bronchitis , chronic (HCC) 01/06/2024   Chronic cough 10/15/2023   GERD (gastroesophageal reflux disease) 08/30/2023   S/P total knee replacement, right 05/28/23 06/07/2023   Arthritis of knee, right 05/28/2023   Abnormal CT scan, chest 04/21/2023   Bilateral chronic knee pain 04/19/2023   Annual visit for general adult medical examination with abnormal findings 04/19/2023   Mixed simple and mucopurulent chronic bronchitis (HCC) 03/01/2023   Dysphagia 03/01/2023   Hoarseness of voice 03/01/2023   COPD with acute exacerbation (HCC) 01/30/2023   Positive colorectal cancer screening using Cologuard test 03/27/2022   Herpes zoster 03/15/2022   Reduced vision 10/26/2021   Nausea 09/04/2021   COPD (chronic obstructive pulmonary disease) (HCC) 04/12/2021   Knee pain, bilateral 05/10/2020   Allergic rhinitis 05/10/2020   S/P arthroscopy of right knee 10/16/18 10/23/2018   Meniscus, lateral, derangement, right    Primary osteoarthritis of right knee     Back spasm 08/11/2018   Essential hypertension 06/02/2018   Anemia    Normocytic anemia, not due to blood loss 07/26/2017   Vitamin B12 deficiency 07/26/2017   Shoulder pain, left 05/22/2017   Encounter for immunization 01/12/2017   Tubular adenoma of colon 07/17/2015   H/O nicotine dependence 07/17/2015   Unsteady gait 07/05/2014   Back pain with right-sided radiculopathy 06/07/2014   Osteoporosis 04/21/2014   IGT (impaired glucose tolerance) 09/20/2013   Adult hypothyroidism 02/02/2011   Hyperlipidemia 05/03/2010   SHOULDER PAIN, LEFT 05/03/2010   Vitamin D  deficiency 01/10/2010   OSTEOARTHRITIS, KNEES, BILATERAL 01/10/2010   Fatigue 01/10/2010   Nephrolithiasis, left 01/10/2010   HAND PAIN, BILATERAL 11/17/2008   VAGINITIS, ATROPHIC 10/05/2008   CARPAL TUNNEL SYNDROME, BILATERAL 09/07/2008   Rheumatoid arthritis (HCC) 09/07/2008   Low back pain with radiation 09/07/2008   Past Medical History:  Diagnosis Date   Acute pain of right knee 01/30/2011   Qualifier: Diagnosis of  By: Antonetta MD, Jadelin     Anemia    Arthritis    Carpal tunnel syndrome, bilateral    COPD (chronic obstructive pulmonary disease) (HCC)    Diverticulosis dx 2011   GERD (gastroesophageal reflux disease)    Headache    Hypertension 06/02/2018   Hypothyroidism    Low back pain    Obesity    Rheumatoid arthritis(714.0)    Family History  Problem Relation Age of Onset   Heart failure Mother    Cancer Mother  65       oral   Heart failure Father    Arthritis Sister    Heart attack Brother    Thyroid  disease Daughter    Hypertension Daughter    Hypertension Daughter    Thyroid  disease Daughter    Heart failure Sister    Diabetes Sister    Hypertension Sister    Leukemia Brother    Colon cancer Neg Hx    Colon polyps Neg Hx    Past Surgical History:  Procedure Laterality Date   bilateral foot surgery     COLONOSCOPY  2011   Dr. Harvey: 3mm sessile polyp (tubular adenoma), pancolonic  diverticulosis   COLONOSCOPY N/A 09/26/2017   diverticulosis, redundant colon   COLONOSCOPY WITH PROPOFOL  N/A 05/01/2022   Procedure: COLONOSCOPY WITH PROPOFOL ;  Surgeon: Cindie Carlin POUR, DO;  Location: [facility];  Service: Endoscopy;  Laterality: N/A;  11:30am   ESOPHAGOGASTRODUODENOSCOPY N/A 09/16/2017   Dr. Harvey: multiple gastric polyps, (fundic gland), mild gastritis, path with duodenal mucosa with focal villous blunting and surface erosions, moderate chronic gastritis with intestinal metaplasia and reactive changes. no H.pylori, dysplasia, or malignancy   FLEXIBLE SIGMOIDOSCOPY N/A 09/16/2017   Procedure: FLEXIBLE SIGMOIDOSCOPY;  Surgeon: Harvey Margo CROME, MD;  Location: [facility];  Service: Endoscopy;  Laterality: N/A;   HEMORRHOID SURGERY  1980   KNEE ARTHROSCOPY WITH LATERAL MENISECTOMY Right 10/16/2018   Procedure: KNEE ARTHROSCOPY WITH LATERAL MENISCECTOMY;  Surgeon: Margrette Taft BRAVO, MD;  Location: [facility];  Service: Orthopedics;  Laterality: Right;   OOPHORECTOMY Right    roophorectomy for benign disease  02/2010   Dr. Edsel   tonsillectomy and adenoidctomy in childhood     TOTAL KNEE ARTHROPLASTY Right 05/28/2023   Procedure: TOTAL KNEE ARTHROPLASTY;  Surgeon: Margrette Taft BRAVO, MD;  Location: [facility];  Service: Orthopedics;  Laterality: Right;   TUBAL LIGATION  1977   Social History   Occupational History   Occupation: retired    Comment: part time    Occupation: disabled for arthritis     Comment: since age 65    Employer: [employer]  Tobacco Use   Smoking status: Former    Current packs/day: 0.00    Average packs/day: 2.0 packs/day for 25.0 years (50.0 ttl pk-yrs)    Types: Cigarettes    Start date: 05/12/1989    Quit date: 05/12/2014    Years since quitting: 10.2   Smokeless tobacco: Never  Vaping Use   Vaping status: Never Used  Substance and Sexual Activity   Alcohol use: No   Drug use: No   Sexual activity: Not Currently    Malaisha Silliman Estela)  Ar[place]inda, M.D. [facility], Hand Surgery

## 2024-08-18 NOTE — Therapy (Signed)
 OUTPATIENT OCCUPATIONAL THERAPY ORTHO EVALUATION  Patient Name: Bianca Mcguire MRN: [id number] DOB:Aug 28, 1946, 78 y.o., female Today's Date: 08/18/2024  PCP: Antonetta BATTLE MD REFERRING PROVIDER: Dr Arlinda  END OF SESSION:  OT End of Session - 08/18/24 1025     Visit Number 1    Number of Visits 7    Date for OT Re-Evaluation 10/02/24    Authorization Type [employer]    OT Start Time 1026    OT Stop Time 1102    OT Time Calculation (min) 36 min    Equipment Utilized During Treatment Orthotic materials    Activity Tolerance Patient tolerated treatment well;No increased pain;Patient limited by fatigue;Patient limited by pain    Behavior During Therapy [facility] for tasks assessed/performed          Past Medical History:  Diagnosis Date   Acute pain of right knee 01/30/2011   Qualifier: Diagnosis of  By: Antonetta MD, Anaalicia     Anemia    Arthritis    Carpal tunnel syndrome, bilateral    COPD (chronic obstructive pulmonary disease) (HCC)    Diverticulosis dx 2011   GERD (gastroesophageal reflux disease)    Headache    Hypertension 06/02/2018   Hypothyroidism    Low back pain    Obesity    Rheumatoid arthritis(714.0)    Past Surgical History:  Procedure Laterality Date   bilateral foot surgery     COLONOSCOPY  2011   Dr. Harvey: 3mm sessile polyp (tubular adenoma), pancolonic diverticulosis   COLONOSCOPY N/A 09/26/2017   diverticulosis, redundant colon   COLONOSCOPY WITH PROPOFOL  N/A 05/01/2022   Procedure: COLONOSCOPY WITH PROPOFOL ;  Surgeon: Cindie Carlin POUR, DO;  Location: [facility];  Service: Endoscopy;  Laterality: N/A;  11:30am   ESOPHAGOGASTRODUODENOSCOPY N/A 09/16/2017   Dr. Harvey: multiple gastric polyps, (fundic gland), mild gastritis, path with duodenal mucosa with focal villous blunting and surface erosions, moderate chronic gastritis with intestinal metaplasia and reactive changes. no H.pylori, dysplasia, or malignancy   FLEXIBLE SIGMOIDOSCOPY N/A  09/16/2017   Procedure: FLEXIBLE SIGMOIDOSCOPY;  Surgeon: Harvey Margo CROME, MD;  Location: [facility];  Service: Endoscopy;  Laterality: N/A;   HEMORRHOID SURGERY  1980   KNEE ARTHROSCOPY WITH LATERAL MENISECTOMY Right 10/16/2018   Procedure: KNEE ARTHROSCOPY WITH LATERAL MENISCECTOMY;  Surgeon: Margrette Taft BRAVO, MD;  Location: [facility];  Service: Orthopedics;  Laterality: Right;   OOPHORECTOMY Right    roophorectomy for benign disease  02/2010   Dr. Edsel   tonsillectomy and adenoidctomy in childhood     TOTAL KNEE ARTHROPLASTY Right 05/28/2023   Procedure: TOTAL KNEE ARTHROPLASTY;  Surgeon: Margrette Taft BRAVO, MD;  Location: [facility];  Service: Orthopedics;  Laterality: Right;   TUBAL LIGATION  1977   Patient Active Problem List   Diagnosis Date Noted   Asthmatic bronchitis , chronic (HCC) 01/06/2024   Chronic cough 10/15/2023   GERD (gastroesophageal reflux disease) 08/30/2023   S/P total knee replacement, right 05/28/23 06/07/2023   Arthritis of knee, right 05/28/2023   Abnormal CT scan, chest 04/21/2023   Bilateral chronic knee pain 04/19/2023   Annual visit for general adult medical examination with abnormal findings 04/19/2023   Mixed simple and mucopurulent chronic bronchitis (HCC) 03/01/2023   Dysphagia 03/01/2023   Hoarseness of voice 03/01/2023   COPD with acute exacerbation (HCC) 01/30/2023   Positive colorectal cancer screening using Cologuard test 03/27/2022   Herpes zoster 03/15/2022   Reduced vision 10/26/2021   Nausea 09/04/2021   COPD (  chronic obstructive pulmonary disease) (HCC) 04/12/2021   Knee pain, bilateral 05/10/2020   Allergic rhinitis 05/10/2020   S/P arthroscopy of right knee 10/16/18 10/23/2018   Meniscus, lateral, derangement, right    Primary osteoarthritis of right knee    Back spasm 08/11/2018   Essential hypertension 06/02/2018   Anemia    Normocytic anemia, not due to blood loss 07/26/2017   Vitamin B12 deficiency 07/26/2017   Shoulder  pain, left 05/22/2017   Encounter for immunization 01/12/2017   Tubular adenoma of colon 07/17/2015   H/O nicotine dependence 07/17/2015   Unsteady gait 07/05/2014   Back pain with right-sided radiculopathy 06/07/2014   Osteoporosis 04/21/2014   IGT (impaired glucose tolerance) 09/20/2013   Adult hypothyroidism 02/02/2011   Hyperlipidemia 05/03/2010   SHOULDER PAIN, LEFT 05/03/2010   Vitamin D  deficiency 01/10/2010   OSTEOARTHRITIS, KNEES, BILATERAL 01/10/2010   Fatigue 01/10/2010   Nephrolithiasis, left 01/10/2010   HAND PAIN, BILATERAL 11/17/2008   VAGINITIS, ATROPHIC 10/05/2008   CARPAL TUNNEL SYNDROME, BILATERAL 09/07/2008   Rheumatoid arthritis (HCC) 09/07/2008   Low back pain with radiation 09/07/2008    ONSET DATE: Chronic  REFERRING DIAG: M79.641,M79.642 (ICD-10-CM) - Bilateral hand pain   THERAPY DIAG:  Stiffness of right hand, not elsewhere classified - Plan: Ot plan of care cert/re-cert  Localized edema - Plan: Ot plan of care cert/re-cert  Muscle weakness (generalized) - Plan: Ot plan of care cert/re-cert  Pain in right hand - Plan: Ot plan of care cert/re-cert  Pain in left hand - Plan: Ot plan of care cert/re-cert  Stiffness of left wrist, not elsewhere classified - Plan: Ot plan of care cert/re-cert  Other lack of coordination - Plan: Ot plan of care cert/re-cert  Rationale for Evaluation and Treatment: Rehabilitation  SUBJECTIVE:   SUBJECTIVE STATEMENT: She states she has had pain in this deformity in bilateral wrist and hand for years.  She states following up with a rheumatologist over the past 2 years, but feeling worse over that time.  She is requesting a supportive orthosis today to simulate wrist fusion and also to see if it adds any stability and increased function through her arm.  We would do this with her right dominant hand and arm to begin with.  She currently does not have any types of supportive braces that she uses for her  hands   PERTINENT HISTORY: She has not had therapy for her hand arthritis or problems yet.  She went to go see the hand surgeon to see if there is any kind of surgery that could correct her deformities and significant ulnar drift at the MCP joints.  He recommended to start with wrist fusions before he can touch the MCP joints.  He ordered therapy to make a supportive brace to try to help with function but also to simulate a wrist fusion to see if she could still lift her active lifestyle with such a fusion.  PRECAUTIONS: None  RED FLAGS: None   WEIGHT BEARING RESTRICTIONS: No  PAIN:  Are you having pain? Yes: NPRS scale: 1-2/10 at rest now, medicated.  Up to 5-6/10 when driving and at worst  Pain location: Bilateral hands and wrists Pain description: Aching and sore Aggravating factors: Usage Relieving factors: Rest  FALLS: Has patient fallen in last 6 months? No  PLOF: Patient is considered modified independent with most home and self-care tasks, though she did have someone drive her to the therapy session today and arrived with her in the waiting room  PATIENT GOALS: To try to regain function through the wrists and hands bilaterally  NEXT MD VISIT: The hand surgeon recommended to follow-up in 4 to 6 weeks after wearing the orthosis to ensure that she would be able to handle the wrist fusion   OBJECTIVE: (All objective assessments below are from initial evaluation on: 08/18/24 unless otherwise specified.)   HAND DOMINANCE: Right   ADLs: Overall ADLs: States decreased ability to grab, hold household objects, pain and difficulty to open containers, perform FMS tasks (manipulate fasteners on clothing), mild to moderate bathing problems as well.    FUNCTIONAL OUTCOME MEASURES: Eval: Patient Specific Functional Scale: 2.8 (driving, vacuum, writing)  (Higher Score  =  Better Ability for the Selected Tasks)      UPPER EXTREMITY ROM    Evaluation: Right hand and wrist were mainly  measured today as a representative example of both hands and wrists.  Middle finger was measured as a representative example of how all of the fingers of the right hand are moving currently-and they are in a significant ulnar drift and subluxation.  Shoulder to Wrist AROM Right eval  Forearm supination   Forearm pronation    Wrist flexion 50  Wrist extension 35 pain  Wrist ulnar deviation   Wrist radial deviation   (Blank rows = not tested)   Hand AROM Right eval  Full Fist Ability (or Gap to Distal Palmar Crease) Unable   Thumb Opposition  (Kapandji Scale)    Thumb MCP (0-60)   Thumb IP (0-80)   Thumb Radial Abduction Span   Thumb Palmar Abduction Span   Index MCP (0-90)   Index PIP (0-100)   Index DIP (0-70)    Long MCP (0-90) (-70) -  95  Long PIP (0-100) (-30) -  47  Long DIP (0-70) (-22)-  47  Ring MCP (0-90)    Ring PIP (0-100)    Ring DIP (0-70)    Little MCP (0-90)    Little PIP (0-100)    Little DIP (0-70)    (Blank rows = not tested)   HAND FUNCTION: Eval: Observed weakness in affected bilateral hands, as seen by inability to make a functional fist or open her hand.  Details will be tested in the future as needed/helpful.  Grip strength Right: TBD lbs, Left: TBD lbs   COORDINATION: Eval: Observed coordination impairments with affected bilateral hands/wrists, as seen by inability to make a fist or functionally oppose her thumb to her fingers.  Details can be checked in future sessions as time allows.  SENSATION: Eval:  Light touch intact today  EDEMA:   Eval: None significant other than arthritic swelling at the knuckles  COGNITION: Eval: Overall cognitive status: WFL for evaluation today   OBSERVATIONS:   Eval: Both hands have significant stiffness with wrist extension, tightness with wrist flexion and deviations, inability to open her hands or fully close them.  Significant ulnar drift noted with swelling at the knuckles of all joints of all phalanges.      TODAY'S TREATMENT:  Post-evaluation treatment:   Custom orthotic fabrication was indicated due to pt's deformed postures and poor functional use of right hand and arm and need for safe, functional positioning. OT fabricated custom right arm wrist cock up orthosis with some lateral support to improve ulnar drift and small finger and ring finger and hold the wrist in a more natural slightly extended posture. It fit well with no areas of pressure, pt states a comfortable fit. Pt was  educated on the wearing schedule (on at all times except for hygiene, as tolerated), to avoid exposing it to sources of heat, to wipe clean as needed (do not wash, use harsh detergents), to call or come in ASAP if it is causing any irritation or is not achieving desired function. It will be checked/adjusted in upcoming sessions, as needed. Pt states understanding all directions.    We did not have time to get into any types of HEP or exercises due to her being in addition to my schedule at the last minute today.  Also the main goal was to get an orthosis to stimulate a wrist fusion and to support her wrist today.  OT would be glad to create other orthotic fabrication's and give her exercises and activities to try to perform to improve function and ability as well in upcoming sessions if she so wishes.     PATIENT EDUCATION: Education details: See tx section above for details  Person educated: Patient Education method: Verbal Instruction, Teach back, Handouts  Education comprehension: States and demonstrates understanding, Additional Education required    HOME EXERCISE PROGRAM: See tx section above for details    GOALS: Goals reviewed with patient? Yes   SHORT TERM GOALS: (STG required if POC>30 days) Target Date: 08/18/2024  Pt will obtain protective, custom orthotic. Goal status: MET      LONG TERM GOALS: Target Date: 10/02/2024  Pt will improve functional ability by decreased impairment per PSFS  assessment from 2.8 to 5 or better, for better quality of life. Goal status: INITIAL  2.  Pt will get an HEP and state understanding for the management of hand stiffness, pain as well as in the wrist, to help improve functional mobility. Goal status: INITIAL  3.  Pt will improve A/ROM in bilateral hands from inability to functionally open or close her hands completely to able to hold and pinch objects for daily ability Goal status: INITIAL  4.  Pt will decrease pain at worst from 5-6/10 to 2/10 or better to have better sleep and occupational participation in daily roles. Goal status: INITIAL   ASSESSMENT:  CLINICAL IMPRESSION: Patient is a 78y.o. female who was seen today for occupational therapy evaluation for deformity, stiffness, weakness, poor coordination in both hands and wrist and arms from rheumatism as well as years of compensation, etc.  The patient will benefit from outpatient occupational therapy to decrease symptoms, improve functional upper extremity use, and increase quality of life.  PERFORMANCE DEFICITS: in functional skills including ADLs, IADLs, coordination, dexterity, ROM, strength, pain, fascial restrictions, flexibility, Fine motor control, Gross motor control, body mechanics, endurance, decreased knowledge of precautions, and UE functional use, cognitive skills including problem solving and safety awareness, and psychosocial skills including coping strategies, environmental adaptation, habits, and routines and behaviors.   IMPAIRMENTS: are limiting patient from ADLs, IADLs, rest and sleep, and leisure.   COMORBIDITIES: has co-morbidities such as bilateral carpal tunnel syndrome, COPD, hypertension, low back pain, other factors that affects occupational performance. Patient will benefit from skilled OT to address above impairments and improve overall function.  MODIFICATION OR ASSISTANCE TO COMPLETE EVALUATION: Min-Moderate modification of tasks or assist with assess  necessary to complete an evaluation.  OT OCCUPATIONAL PROFILE AND HISTORY: Detailed assessment: Review of records and additional review of physical, cognitive, psychosocial history related to current functional performance.  CLINICAL DECISION MAKING: Moderate - several treatment options, min-mod task modification necessary  REHAB POTENTIAL: Good  EVALUATION COMPLEXITY: Moderate  PLAN:  OT FREQUENCY: 1x/week  OT DURATION: 6 weeks through 10/02/2024 and up to 7 total visits as needed   PLANNED INTERVENTIONS: 97535 self care/ADL training, 02889 therapeutic exercise, 97530 therapeutic activity, 97112 neuromuscular re-education, 97140 manual therapy, 97035 ultrasound, 97032 electrical stimulation (manual), 97760 Orthotic Initial, [id number] Orthotic/Prosthetic subsequent, compression bandaging, Dry needling, energy conservation, coping strategies training, and patient/family education  RECOMMENDED OTHER SERVICES: none now    CONSULTED AND AGREED WITH PLAN OF CARE: Patient  PLAN FOR NEXT SESSION:  check orthosis, consider giving out HEP or other orthoses to improve ulnar drift possibly.  Also consider left hand orthoses as helpful.   Melvenia Ada, OTR/L, CHT  08/18/2024, 12:57 PM   Date of referral: 08/18/24 Referring provider: Dr Arlinda  Referring diagnosis? M79.641,M79.642 (ICD-10-CM) - Bilateral hand pain  Treatment diagnosis? (if different than referring diagnosis) M25.641 , M25.642; M79.641;  F20.357   What was this (referring dx) caused by? Ongoing Issue and Arthritis  Lysle of Condition: Chronic (continuous duration > 3 months)   Laterality: Both  Current Functional Measure Score: Patient Specific Functional Scale 2.8  Objective measurements identify impairments when they are compared to normal values, the uninvolved extremity, and prior level of function.  [x]  Yes  []  No  Objective assessment of functional ability: Moderate functional limitations   Briefly  describe symptoms: Stiffness, decreased coordination, pain bilaterally  How did symptoms start: Chronically with rheumatoid arthritis  Average pain intensity:  Last 24 hours: 3-6/10  Past week: 3-6/10  How often does the pt experience symptoms? Frequently  How much have the symptoms interfered with usual daily activities? Quite a bit  How has condition changed since care began at this facility? NA - initial visit  In general, how is the patients overall health? Good   BACK PAIN (STarT Back Screening Tool) No

## 2024-08-20 NOTE — Therapy (Signed)
 OUTPATIENT OCCUPATIONAL THERAPY TREATMENT NOTE   Patient Name: Bianca Mcguire MRN: [id number] DOB:12/03/1946, 78 y.o., female Today's Date: 08/24/2024  PCP: Antonetta BATTLE MD REFERRING PROVIDER: Dr Arlinda  END OF SESSION:  OT End of Session - 08/24/24 1017     Visit Number 2    Number of Visits 7    Date for OT Re-Evaluation 10/02/24    Authorization Type [employer]    OT Start Time 1017    OT Stop Time 1105    OT Time Calculation (min) 48 min    Equipment Utilized During Treatment Orthotic materials    Activity Tolerance Patient tolerated treatment well;No increased pain;Patient limited by fatigue;Patient limited by pain    Behavior During Therapy [facility] for tasks assessed/performed           Past Medical History:  Diagnosis Date   Acute pain of right knee 01/30/2011   Qualifier: Diagnosis of  By: Antonetta MD, Zelie     Anemia    Arthritis    Carpal tunnel syndrome, bilateral    COPD (chronic obstructive pulmonary disease) (HCC)    Diverticulosis dx 2011   GERD (gastroesophageal reflux disease)    Headache    Hypertension 06/02/2018   Hypothyroidism    Low back pain    Obesity    Rheumatoid arthritis(714.0)    Past Surgical History:  Procedure Laterality Date   bilateral foot surgery     COLONOSCOPY  2011   Dr. Harvey: 3mm sessile polyp (tubular adenoma), pancolonic diverticulosis   COLONOSCOPY N/A 09/26/2017   diverticulosis, redundant colon   COLONOSCOPY WITH PROPOFOL  N/A 05/01/2022   Procedure: COLONOSCOPY WITH PROPOFOL ;  Surgeon: Cindie Carlin POUR, DO;  Location: [facility];  Service: Endoscopy;  Laterality: N/A;  11:30am   ESOPHAGOGASTRODUODENOSCOPY N/A 09/16/2017   Dr. Harvey: multiple gastric polyps, (fundic gland), mild gastritis, path with duodenal mucosa with focal villous blunting and surface erosions, moderate chronic gastritis with intestinal metaplasia and reactive changes. no H.pylori, dysplasia, or malignancy   FLEXIBLE SIGMOIDOSCOPY N/A  09/16/2017   Procedure: FLEXIBLE SIGMOIDOSCOPY;  Surgeon: Harvey Margo CROME, MD;  Location: [facility];  Service: Endoscopy;  Laterality: N/A;   HEMORRHOID SURGERY  1980   KNEE ARTHROSCOPY WITH LATERAL MENISECTOMY Right 10/16/2018   Procedure: KNEE ARTHROSCOPY WITH LATERAL MENISCECTOMY;  Surgeon: Margrette Taft BRAVO, MD;  Location: [facility];  Service: Orthopedics;  Laterality: Right;   OOPHORECTOMY Right    roophorectomy for benign disease  02/2010   Dr. Edsel   tonsillectomy and adenoidctomy in childhood     TOTAL KNEE ARTHROPLASTY Right 05/28/2023   Procedure: TOTAL KNEE ARTHROPLASTY;  Surgeon: Margrette Taft BRAVO, MD;  Location: [facility];  Service: Orthopedics;  Laterality: Right;   TUBAL LIGATION  1977   Patient Active Problem List   Diagnosis Date Noted   Asthmatic bronchitis , chronic (HCC) 01/06/2024   Chronic cough 10/15/2023   GERD (gastroesophageal reflux disease) 08/30/2023   S/P total knee replacement, right 05/28/23 06/07/2023   Arthritis of knee, right 05/28/2023   Abnormal CT scan, chest 04/21/2023   Bilateral chronic knee pain 04/19/2023   Annual visit for general adult medical examination with abnormal findings 04/19/2023   Mixed simple and mucopurulent chronic bronchitis (HCC) 03/01/2023   Dysphagia 03/01/2023   Hoarseness of voice 03/01/2023   COPD with acute exacerbation (HCC) 01/30/2023   Positive colorectal cancer screening using Cologuard test 03/27/2022   Herpes zoster 03/15/2022   Reduced vision 10/26/2021   Nausea 09/04/2021  COPD (chronic obstructive pulmonary disease) (HCC) 04/12/2021   Knee pain, bilateral 05/10/2020   Allergic rhinitis 05/10/2020   S/P arthroscopy of right knee 10/16/18 10/23/2018   Meniscus, lateral, derangement, right    Primary osteoarthritis of right knee    Back spasm 08/11/2018   Essential hypertension 06/02/2018   Anemia    Normocytic anemia, not due to blood loss 07/26/2017   Vitamin B12 deficiency 07/26/2017   Shoulder  pain, left 05/22/2017   Encounter for immunization 01/12/2017   Tubular adenoma of colon 07/17/2015   H/O nicotine dependence 07/17/2015   Unsteady gait 07/05/2014   Back pain with right-sided radiculopathy 06/07/2014   Osteoporosis 04/21/2014   IGT (impaired glucose tolerance) 09/20/2013   Adult hypothyroidism 02/02/2011   Hyperlipidemia 05/03/2010   SHOULDER PAIN, LEFT 05/03/2010   Vitamin D  deficiency 01/10/2010   OSTEOARTHRITIS, KNEES, BILATERAL 01/10/2010   Fatigue 01/10/2010   Nephrolithiasis, left 01/10/2010   HAND PAIN, BILATERAL 11/17/2008   VAGINITIS, ATROPHIC 10/05/2008   CARPAL TUNNEL SYNDROME, BILATERAL 09/07/2008   Rheumatoid arthritis (HCC) 09/07/2008   Low back pain with radiation 09/07/2008    ONSET DATE: Chronic  REFERRING DIAG: M79.641,M79.642 (ICD-10-CM) - Bilateral hand pain   THERAPY DIAG:  Localized edema  Muscle weakness (generalized)  Pain in right hand  Pain in left hand  Stiffness of right hand, not elsewhere classified  Stiffness of left wrist, not elsewhere classified  Other lack of coordination  Rationale for Evaluation and Treatment: Rehabilitation  PERTINENT HISTORY: She has not had therapy for her hand arthritis or problems yet.  She went to go see the hand surgeon to see if there is any kind of surgery that could correct her deformities and significant ulnar drift at the MCP joints.  He recommended to start with wrist fusions before he can touch the MCP joints.  He ordered therapy to make a supportive brace to try to help with function but also to simulate a wrist fusion to see if she could still lift her active lifestyle with such a fusion. She states she has had pain in this deformity in bilateral wrist and hand for years.  She states following up with a rheumatologist over the past 2 years, but feeling worse over that time.  She is requesting a supportive orthosis today to simulate wrist fusion and also to see if it adds any stability  and increased function through her arm.  We would do this with her right dominant hand and arm to begin with.  She currently does not have any types of supportive braces that she uses for her hands  PRECAUTIONS: None  RED FLAGS: None   WEIGHT BEARING RESTRICTIONS: No   SUBJECTIVE:   SUBJECTIVE STATEMENT: She states she does not feel that she should have a major surgery due to her age and the severity of her problems.  She would like conservative treatment options and perhaps a hand-based antiulnar drift orthosis.   PAIN:  Are you having pain? Yes: NPRS scale:  1-2/10 at rest now, medicated.  Up to 5-6/10 when driving and at worst  Pain location: Bilateral hands and wrists Pain description: Aching and sore Aggravating factors: Usage Relieving factors: Rest   PATIENT GOALS: To try to regain function through the wrists and hands bilaterally  NEXT MD VISIT: The hand surgeon recommended to follow-up in 4 to 6 weeks after wearing the orthosis to ensure that she would be able to handle the wrist fusion   OBJECTIVE: (All objective assessments below  are from initial evaluation on: 08/18/24 unless otherwise specified.)   HAND DOMINANCE: Right   ADLs: Overall ADLs: States decreased ability to grab, hold household objects, pain and difficulty to open containers, perform FMS tasks (manipulate fasteners on clothing), mild to moderate bathing problems as well.    FUNCTIONAL OUTCOME MEASURES: Eval: Patient Specific Functional Scale: 2.8 (driving, vacuum, writing)  (Higher Score  =  Better Ability for the Selected Tasks)      UPPER EXTREMITY ROM    Evaluation: Right hand and wrist were mainly measured today as a representative example of both hands and wrists.  Middle finger was measured as a representative example of how all of the fingers of the right hand are moving currently-and they are in a significant ulnar drift and subluxation.  Shoulder to Wrist AROM Right eval  Forearm  supination   Forearm pronation    Wrist flexion 50  Wrist extension 35 pain  Wrist ulnar deviation   Wrist radial deviation   (Blank rows = not tested)   Hand AROM Right eval  Full Fist Ability (or Gap to Distal Palmar Crease) Unable   Thumb Opposition  (Kapandji Scale)    Thumb MCP (0-60)   Thumb IP (0-80)   Thumb Radial Abduction Span   Thumb Palmar Abduction Span   Index MCP (0-90)   Index PIP (0-100)   Index DIP (0-70)    Long MCP (0-90) (-70) -  95  Long PIP (0-100) (-30) -  47  Long DIP (0-70) (-22)-  47  Ring MCP (0-90)    Ring PIP (0-100)    Ring DIP (0-70)    Little MCP (0-90)    Little PIP (0-100)    Little DIP (0-70)    (Blank rows = not tested)   HAND FUNCTION: Eval: Observed weakness in affected bilateral hands, as seen by inability to make a functional fist or open her hand.  Details will be tested in the future as needed/helpful.  Grip strength Right: TBD lbs, Left: TBD lbs   COORDINATION: Eval: Observed coordination impairments with affected bilateral hands/wrists, as seen by inability to make a fist or functionally oppose her thumb to her fingers.  Details can be checked in future sessions as time allows.  EDEMA:   Eval: None significant other than arthritic swelling at the knuckles  OBSERVATIONS:   Eval: Both hands have significant stiffness with wrist extension, tightness with wrist flexion and deviations, inability to open her hands or fully close them.  Significant ulnar drift noted with swelling at the knuckles of all joints of all phalanges.     TODAY'S TREATMENT:  08/24/24: She was given ideas for prefabricated orthoses that she can purchase online.  She wants conservative options to help treat her hand deformities.  Unfortunately she did not bring her orthosis for check or modification.  For the right hand, OT makes her an antiulnar drift orthosis with Ortho cast material.  A completely new orthosis today that is hand-based.  It is quite complex  due to the nature of her injury and the severity of her ulnar drift.  It takes almost the whole session to fabricate, but at the end of the session she states that it fits well and her hand feels better with it on.  She demonstrates writing with it on as well as picking up a jar.  She was told to take it off if it causes any rubbing or skin breakdown immediately.  She should bring it back next  week for adjustments as needed.  She demonstrates ability to put it on and off.  She should only wear it with use.  She should not get it wet.    Additionally we reviewed jar opener's which she has 1, and OT also supplies her with build up foam as she does not have this and it may help her hold utensils better.  She demonstrates using it with the left hand fairly well.     PATIENT EDUCATION: Education details: See tx section above for details  Person educated: Patient Education method: Verbal Instruction, Teach back, Handouts  Education comprehension: States and demonstrates understanding, Additional Education required    HOME EXERCISE PROGRAM: See tx section above for details    GOALS: Goals reviewed with patient? Yes   SHORT TERM GOALS: (STG required if POC>30 days) Target Date: 08/18/2024  Pt will obtain protective, custom orthotic. Goal status: MET      LONG TERM GOALS: Target Date: 10/02/2024  Pt will improve functional ability by decreased impairment per PSFS assessment from 2.8 to 5 or better, for better quality of life. Goal status: INITIAL  2.  Pt will get an HEP and state understanding for the management of hand stiffness, pain as well as in the wrist, to help improve functional mobility. Goal status: INITIAL  3.  Pt will improve A/ROM in bilateral hands from inability to functionally open or close her hands completely to able to hold and pinch objects for daily ability Goal status: INITIAL  4.  Pt will decrease pain at worst from 5-6/10 to 2/10 or better to have better  sleep and occupational participation in daily roles. Goal status: INITIAL   ASSESSMENT:  CLINICAL IMPRESSION: 08/24/24: She seems to have decided against getting a fusion at this point, and like to explore conservative options.  She states the new hand orthosis helps, but she should return next week for any adjustments, for any additional bracing needs, to consider at night orthosis to stretch her fingers in a better position, and to possibly adjust the initial orthosis into a antiulnar drift as well.  We can look at further adaptive equipment as needed as well.  Eval: Patient is a 78y.o. female who was seen today for occupational therapy evaluation for deformity, stiffness, weakness, poor coordination in both hands and wrist and arms from rheumatism as well as years of compensation, etc.  The patient will benefit from outpatient occupational therapy to decrease symptoms, improve functional upper extremity use, and increase quality of life.      PLAN:  OT FREQUENCY: 1x/week  OT DURATION: 6 weeks through 10/02/2024 and up to 7 total visits as needed   PLANNED INTERVENTIONS: 97535 self care/ADL training, 02889 therapeutic exercise, 97530 therapeutic activity, 97112 neuromuscular re-education, 97140 manual therapy, 97035 ultrasound, 97032 electrical stimulation (manual), 97760 Orthotic Initial, [id number] Orthotic/Prosthetic subsequent, compression bandaging, Dry needling, energy conservation, coping strategies training, and patient/family education  CONSULTED AND AGREED WITH PLAN OF CARE: Patient  PLAN FOR NEXT SESSION:   return next week for any adjustments, for any additional bracing needs, to consider at night orthosis to stretch her fingers in a better position, and to possibly adjust the initial orthosis into a antiulnar drift as well.  We can look at further adaptive equipment as needed as well.  Melvenia Ada, OTR/L, CHT  08/24/2024, 11:10 AM

## 2024-08-24 ENCOUNTER — Ambulatory Visit (INDEPENDENT_AMBULATORY_CARE_PROVIDER_SITE_OTHER): Admitting: Rehabilitative and Restorative Service Providers"

## 2024-08-24 ENCOUNTER — Encounter: Payer: Self-pay | Admitting: Rehabilitative and Restorative Service Providers"

## 2024-08-24 DIAGNOSIS — M79642 Pain in left hand: Secondary | ICD-10-CM | POA: Diagnosis not present

## 2024-08-24 DIAGNOSIS — M25641 Stiffness of right hand, not elsewhere classified: Secondary | ICD-10-CM | POA: Diagnosis not present

## 2024-08-24 DIAGNOSIS — R600 Localized edema: Secondary | ICD-10-CM

## 2024-08-24 DIAGNOSIS — M6281 Muscle weakness (generalized): Secondary | ICD-10-CM

## 2024-08-24 DIAGNOSIS — M79641 Pain in right hand: Secondary | ICD-10-CM | POA: Diagnosis not present

## 2024-08-24 DIAGNOSIS — R278 Other lack of coordination: Secondary | ICD-10-CM

## 2024-08-24 DIAGNOSIS — M25632 Stiffness of left wrist, not elsewhere classified: Secondary | ICD-10-CM | POA: Diagnosis not present

## 2024-08-27 NOTE — Therapy (Incomplete)
 OUTPATIENT OCCUPATIONAL THERAPY TREATMENT NOTE   Patient Name: Bianca Mcguire MRN: [id number] DOB:12-31-1945, 78 y.o., female Today's Date: 08/27/2024  PCP: Antonetta BATTLE MD REFERRING PROVIDER: Dr Arlinda  END OF SESSION:     Past Medical History:  Diagnosis Date   Acute pain of right knee 01/30/2011   Qualifier: Diagnosis of  By: Antonetta MD, Arilla     Anemia    Arthritis    Carpal tunnel syndrome, bilateral    COPD (chronic obstructive pulmonary disease) (HCC)    Diverticulosis dx 2011   GERD (gastroesophageal reflux disease)    Headache    Hypertension 06/02/2018   Hypothyroidism    Low back pain    Obesity    Rheumatoid arthritis(714.0)    Past Surgical History:  Procedure Laterality Date   bilateral foot surgery     COLONOSCOPY  2011   Dr. Harvey: 3mm sessile polyp (tubular adenoma), pancolonic diverticulosis   COLONOSCOPY N/A 09/26/2017   diverticulosis, redundant colon   COLONOSCOPY WITH PROPOFOL  N/A 05/01/2022   Procedure: COLONOSCOPY WITH PROPOFOL ;  Surgeon: Cindie Carlin POUR, DO;  Location: [facility];  Service: Endoscopy;  Laterality: N/A;  11:30am   ESOPHAGOGASTRODUODENOSCOPY N/A 09/16/2017   Dr. Harvey: multiple gastric polyps, (fundic gland), mild gastritis, path with duodenal mucosa with focal villous blunting and surface erosions, moderate chronic gastritis with intestinal metaplasia and reactive changes. no H.pylori, dysplasia, or malignancy   FLEXIBLE SIGMOIDOSCOPY N/A 09/16/2017   Procedure: FLEXIBLE SIGMOIDOSCOPY;  Surgeon: Harvey Margo CROME, MD;  Location: [facility];  Service: Endoscopy;  Laterality: N/A;   HEMORRHOID SURGERY  1980   KNEE ARTHROSCOPY WITH LATERAL MENISECTOMY Right 10/16/2018   Procedure: KNEE ARTHROSCOPY WITH LATERAL MENISCECTOMY;  Surgeon: Margrette Taft BRAVO, MD;  Location: [facility];  Service: Orthopedics;  Laterality: Right;   OOPHORECTOMY Right    roophorectomy for benign disease  02/2010   Dr. Edsel   tonsillectomy  and adenoidctomy in childhood     TOTAL KNEE ARTHROPLASTY Right 05/28/2023   Procedure: TOTAL KNEE ARTHROPLASTY;  Surgeon: Margrette Taft BRAVO, MD;  Location: [facility];  Service: Orthopedics;  Laterality: Right;   TUBAL LIGATION  1977   Patient Active Problem List   Diagnosis Date Noted   Asthmatic bronchitis , chronic (HCC) 01/06/2024   Chronic cough 10/15/2023   GERD (gastroesophageal reflux disease) 08/30/2023   S/P total knee replacement, right 05/28/23 06/07/2023   Arthritis of knee, right 05/28/2023   Abnormal CT scan, chest 04/21/2023   Bilateral chronic knee pain 04/19/2023   Annual visit for general adult medical examination with abnormal findings 04/19/2023   Mixed simple and mucopurulent chronic bronchitis (HCC) 03/01/2023   Dysphagia 03/01/2023   Hoarseness of voice 03/01/2023   COPD with acute exacerbation (HCC) 01/30/2023   Positive colorectal cancer screening using Cologuard test 03/27/2022   Herpes zoster 03/15/2022   Reduced vision 10/26/2021   Nausea 09/04/2021   COPD (chronic obstructive pulmonary disease) (HCC) 04/12/2021   Knee pain, bilateral 05/10/2020   Allergic rhinitis 05/10/2020   S/P arthroscopy of right knee 10/16/18 10/23/2018   Meniscus, lateral, derangement, right    Primary osteoarthritis of right knee    Back spasm 08/11/2018   Essential hypertension 06/02/2018   Anemia    Normocytic anemia, not due to blood loss 07/26/2017   Vitamin B12 deficiency 07/26/2017   Shoulder pain, left 05/22/2017   Encounter for immunization 01/12/2017   Tubular adenoma of colon 07/17/2015   H/O nicotine dependence 07/17/2015   Unsteady gait  07/05/2014   Back pain with right-sided radiculopathy 06/07/2014   Osteoporosis 04/21/2014   IGT (impaired glucose tolerance) 09/20/2013   Adult hypothyroidism 02/02/2011   Hyperlipidemia 05/03/2010   SHOULDER PAIN, LEFT 05/03/2010   Vitamin D  deficiency 01/10/2010   OSTEOARTHRITIS, KNEES, BILATERAL 01/10/2010   Fatigue  01/10/2010   Nephrolithiasis, left 01/10/2010   HAND PAIN, BILATERAL 11/17/2008   VAGINITIS, ATROPHIC 10/05/2008   CARPAL TUNNEL SYNDROME, BILATERAL 09/07/2008   Rheumatoid arthritis (HCC) 09/07/2008   Low back pain with radiation 09/07/2008    ONSET DATE: Chronic  REFERRING DIAG: M79.641,M79.642 (ICD-10-CM) - Bilateral hand pain   THERAPY DIAG:  No diagnosis found.  Rationale for Evaluation and Treatment: Rehabilitation  PERTINENT HISTORY: She has not had therapy for her hand arthritis or problems yet.  She went to go see the hand surgeon to see if there is any kind of surgery that could correct her deformities and significant ulnar drift at the MCP joints.  He recommended to start with wrist fusions before he can touch the MCP joints.  He ordered therapy to make a supportive brace to try to help with function but also to simulate a wrist fusion to see if she could still lift her active lifestyle with such a fusion. She states she has had pain in this deformity in bilateral wrist and hand for years.  She states following up with a rheumatologist over the past 2 years, but feeling worse over that time.  She is requesting a supportive orthosis today to simulate wrist fusion and also to see if it adds any stability and increased function through her arm.  We would do this with her right dominant hand and arm to begin with.  She currently does not have any types of supportive braces that she uses for her hands  PRECAUTIONS: None  RED FLAGS: None   WEIGHT BEARING RESTRICTIONS: No   SUBJECTIVE:   SUBJECTIVE STATEMENT: She states the rt hand,  anti-ulnar drift orthosis is ***   she does not feel that she should have a major surgery due to her age and the severity of her problems.  She would like conservative treatment options and perhaps a hand-based antiulnar drift orthosis.   PAIN:  Are you having pain? Yes: NPRS scale:  *** 1-2/10 at rest now, medicated.  Up to 5-6/10 when  driving and at worst  Pain location: Bilateral hands and wrists Pain description: Aching and sore Aggravating factors: Usage Relieving factors: Rest   PATIENT GOALS: To try to regain function through the wrists and hands bilaterally  NEXT MD VISIT: The hand surgeon recommended to follow-up in 4 to 6 weeks after wearing the orthosis to ensure that she would be able to handle the wrist fusion   OBJECTIVE: (All objective assessments below are from initial evaluation on: 08/18/24 unless otherwise specified.)   HAND DOMINANCE: Right   ADLs: Overall ADLs: States decreased ability to grab, hold household objects, pain and difficulty to open containers, perform FMS tasks (manipulate fasteners on clothing), mild to moderate bathing problems as well.    FUNCTIONAL OUTCOME MEASURES: Eval: Patient Specific Functional Scale: 2.8 (driving, vacuum, writing)  (Higher Score  =  Better Ability for the Selected Tasks)      UPPER EXTREMITY ROM    Evaluation: Right hand and wrist were mainly measured today as a representative example of both hands and wrists.  Middle finger was measured as a representative example of how all of the fingers of the right hand are  moving currently-and they are in a significant ulnar drift and subluxation.  Shoulder to Wrist AROM Right eval  Forearm supination   Forearm pronation    Wrist flexion 50  Wrist extension 35 pain  Wrist ulnar deviation   Wrist radial deviation   (Blank rows = not tested)   Hand AROM Right eval  Full Fist Ability (or Gap to Distal Palmar Crease) Unable   Thumb Opposition  (Kapandji Scale)    Thumb MCP (0-60)   Thumb IP (0-80)   Thumb Radial Abduction Span   Thumb Palmar Abduction Span   Index MCP (0-90)   Index PIP (0-100)   Index DIP (0-70)    Long MCP (0-90) (-70) -  95  Long PIP (0-100) (-30) -  47  Long DIP (0-70) (-22)-  47  Ring MCP (0-90)    Ring PIP (0-100)    Ring DIP (0-70)    Little MCP (0-90)    Little PIP (0-100)     Little DIP (0-70)    (Blank rows = not tested)   HAND FUNCTION: Eval: Observed weakness in affected bilateral hands, as seen by inability to make a functional fist or open her hand.  Details will be tested in the future as needed/helpful.  Grip strength Right: TBD lbs, Left: TBD lbs   COORDINATION: Eval: Observed coordination impairments with affected bilateral hands/wrists, as seen by inability to make a fist or functionally oppose her thumb to her fingers.  Details can be checked in future sessions as time allows.  EDEMA:   Eval: None significant other than arthritic swelling at the knuckles  OBSERVATIONS:   Eval: Both hands have significant stiffness with wrist extension, tightness with wrist flexion and deviations, inability to open her hands or fully close them.  Significant ulnar drift noted with swelling at the knuckles of all joints of all phalanges.     TODAY'S TREATMENT:  08/31/24: ***  return next week for any adjustments, for any additional bracing needs, to consider at night orthosis to stretch her fingers in a better position, and to possibly adjust the initial orthosis into a antiulnar drift as well.  We can look at further adaptive equipment as needed as well.   08/24/24: She was given ideas for prefabricated orthoses that she can purchase online.  She wants conservative options to help treat her hand deformities.  Unfortunately she did not bring her orthosis for check or modification.  For the right hand, OT makes her an antiulnar drift orthosis with Ortho cast material.  A completely new orthosis today that is hand-based.  It is quite complex due to the nature of her injury and the severity of her ulnar drift.  It takes almost the whole session to fabricate, but at the end of the session she states that it fits well and her hand feels better with it on.  She demonstrates writing with it on as well as picking up a jar.  She was told to take it off if it causes any rubbing or  skin breakdown immediately.  She should bring it back next week for adjustments as needed.  She demonstrates ability to put it on and off.  She should only wear it with use.  She should not get it wet.    Additionally we reviewed jar opener's which she has 1, and OT also supplies her with build up foam as she does not have this and it may help her hold utensils better.  She demonstrates using  it with the left hand fairly well.     PATIENT EDUCATION: Education details: See tx section above for details  Person educated: Patient Education method: Verbal Instruction, Teach back, Handouts  Education comprehension: States and demonstrates understanding, Additional Education required    HOME EXERCISE PROGRAM: See tx section above for details    GOALS: Goals reviewed with patient? Yes   SHORT TERM GOALS: (STG required if POC>30 days) Target Date: 08/18/2024  Pt will obtain protective, custom orthotic. Goal status: MET      LONG TERM GOALS: Target Date: 10/02/2024  Pt will improve functional ability by decreased impairment per PSFS assessment from 2.8 to 5 or better, for better quality of life. Goal status: INITIAL  2.  Pt will get an HEP and state understanding for the management of hand stiffness, pain as well as in the wrist, to help improve functional mobility. Goal status: INITIAL  3.  Pt will improve A/ROM in bilateral hands from inability to functionally open or close her hands completely to able to hold and pinch objects for daily ability Goal status: INITIAL  4.  Pt will decrease pain at worst from 5-6/10 to 2/10 or better to have better sleep and occupational participation in daily roles. Goal status: INITIAL   ASSESSMENT:  CLINICAL IMPRESSION: 08/31/24: ***  08/24/24: She seems to have decided against getting a fusion at this point, and like to explore conservative options.  She states the new hand orthosis helps, but she should return next week for any adjustments,  for any additional bracing needs, to consider at night orthosis to stretch her fingers in a better position, and to possibly adjust the initial orthosis into a antiulnar drift as well.  We can look at further adaptive equipment as needed as well.  Eval: Patient is a 78y.o. female who was seen today for occupational therapy evaluation for deformity, stiffness, weakness, poor coordination in both hands and wrist and arms from rheumatism as well as years of compensation, etc.  The patient will benefit from outpatient occupational therapy to decrease symptoms, improve functional upper extremity use, and increase quality of life.      PLAN:  OT FREQUENCY: 1x/week  OT DURATION: 6 weeks through 10/02/2024 and up to 7 total visits as needed   PLANNED INTERVENTIONS: 97535 self care/ADL training, 02889 therapeutic exercise, 97530 therapeutic activity, 97112 neuromuscular re-education, 97140 manual therapy, 97035 ultrasound, 97032 electrical stimulation (manual), 97760 Orthotic Initial, [id number] Orthotic/Prosthetic subsequent, compression bandaging, Dry needling, energy conservation, coping strategies training, and patient/family education  CONSULTED AND AGREED WITH PLAN OF CARE: Patient  PLAN FOR NEXT SESSION:  ***  Melvenia Ada, OTR/L, CHT  08/27/2024, 4:52 PM

## 2024-08-31 ENCOUNTER — Encounter: Admitting: Rehabilitative and Restorative Service Providers"

## 2024-08-31 ENCOUNTER — Telehealth: Payer: Self-pay | Admitting: Rehabilitative and Restorative Service Providers"

## 2024-08-31 ENCOUNTER — Inpatient Hospital Stay (HOSPITAL_COMMUNITY): Admission: RE | Admit: 2024-08-31 | Source: Ambulatory Visit

## 2024-08-31 NOTE — Telephone Encounter (Signed)
 Pt called about not showing up to today's appointment.  It is possible that she did leave a message that has not been checked yet (front desk out sick today).  She was reminded of next appointment date and time and was asked to please call and cancel it if she felt like she did not need to keep the appointment.

## 2024-09-07 ENCOUNTER — Encounter: Admitting: Rehabilitative and Restorative Service Providers"

## 2024-09-21 ENCOUNTER — Encounter (HOSPITAL_COMMUNITY): Payer: Self-pay

## 2024-09-21 ENCOUNTER — Other Ambulatory Visit: Payer: Self-pay

## 2024-09-21 ENCOUNTER — Inpatient Hospital Stay (HOSPITAL_COMMUNITY)
Admission: EM | Admit: 2024-09-21 | Discharge: 2024-09-23 | DRG: 189 | Disposition: A | Discharge status: Home / Self Care | Source: Ambulatory Visit | Attending: Family Medicine | Admitting: Family Medicine

## 2024-09-21 ENCOUNTER — Ambulatory Visit: Payer: Self-pay

## 2024-09-21 ENCOUNTER — Emergency Department (HOSPITAL_COMMUNITY)

## 2024-09-21 DIAGNOSIS — Z96651 Presence of right artificial knee joint: Secondary | ICD-10-CM | POA: Diagnosis present

## 2024-09-21 DIAGNOSIS — Z79631 Long term (current) use of antimetabolite agent: Secondary | ICD-10-CM

## 2024-09-21 DIAGNOSIS — Z7952 Long term (current) use of systemic steroids: Secondary | ICD-10-CM

## 2024-09-21 DIAGNOSIS — M069 Rheumatoid arthritis, unspecified: Secondary | ICD-10-CM | POA: Diagnosis present

## 2024-09-21 DIAGNOSIS — J9601 Acute respiratory failure with hypoxia: Principal | ICD-10-CM | POA: Diagnosis present

## 2024-09-21 DIAGNOSIS — Z79899 Other long term (current) drug therapy: Secondary | ICD-10-CM | POA: Diagnosis not present

## 2024-09-21 DIAGNOSIS — Z1152 Encounter for screening for COVID-19: Secondary | ICD-10-CM | POA: Diagnosis not present

## 2024-09-21 DIAGNOSIS — J441 Chronic obstructive pulmonary disease with (acute) exacerbation: Secondary | ICD-10-CM | POA: Diagnosis present

## 2024-09-21 DIAGNOSIS — Z8249 Family history of ischemic heart disease and other diseases of the circulatory system: Secondary | ICD-10-CM

## 2024-09-21 DIAGNOSIS — I10 Essential (primary) hypertension: Secondary | ICD-10-CM | POA: Diagnosis not present

## 2024-09-21 DIAGNOSIS — Z8261 Family history of arthritis: Secondary | ICD-10-CM

## 2024-09-21 DIAGNOSIS — E039 Hypothyroidism, unspecified: Secondary | ICD-10-CM | POA: Diagnosis not present

## 2024-09-21 DIAGNOSIS — M810 Age-related osteoporosis without current pathological fracture: Secondary | ICD-10-CM | POA: Diagnosis present

## 2024-09-21 DIAGNOSIS — K219 Gastro-esophageal reflux disease without esophagitis: Secondary | ICD-10-CM | POA: Diagnosis present

## 2024-09-21 DIAGNOSIS — Z806 Family history of leukemia: Secondary | ICD-10-CM | POA: Diagnosis not present

## 2024-09-21 DIAGNOSIS — Z7983 Long term (current) use of bisphosphonates: Secondary | ICD-10-CM

## 2024-09-21 DIAGNOSIS — R059 Cough, unspecified: Secondary | ICD-10-CM | POA: Diagnosis not present

## 2024-09-21 DIAGNOSIS — E785 Hyperlipidemia, unspecified: Secondary | ICD-10-CM | POA: Diagnosis present

## 2024-09-21 DIAGNOSIS — Z7951 Long term (current) use of inhaled steroids: Secondary | ICD-10-CM | POA: Diagnosis not present

## 2024-09-21 DIAGNOSIS — Z79890 Hormone replacement therapy (postmenopausal): Secondary | ICD-10-CM

## 2024-09-21 DIAGNOSIS — I517 Cardiomegaly: Secondary | ICD-10-CM | POA: Diagnosis not present

## 2024-09-21 DIAGNOSIS — Z833 Family history of diabetes mellitus: Secondary | ICD-10-CM

## 2024-09-21 DIAGNOSIS — R0602 Shortness of breath: Secondary | ICD-10-CM | POA: Diagnosis not present

## 2024-09-21 LAB — COMPREHENSIVE METABOLIC PANEL WITH GFR
ALT: 9 U/L (ref 0–44)
AST: 20 U/L (ref 15–41)
Albumin: 4 g/dL (ref 3.5–5.0)
Alkaline Phosphatase: 103 U/L (ref 38–126)
Anion gap: 10 (ref 5–15)
BUN: 18 mg/dL (ref 8–23)
CO2: 28 mmol/L (ref 22–32)
Calcium: 9.3 mg/dL (ref 8.9–10.3)
Chloride: 105 mmol/L (ref 98–111)
Creatinine, Ser: 0.82 mg/dL (ref 0.44–1.00)
GFR, Estimated: >60 mL/min (ref >60)
Glucose, Bld: 113 mg/dL — ABNORMAL HIGH (ref 70–99)
Potassium: 3.8 mmol/L (ref 3.5–5.1)
Sodium: 143 mmol/L (ref 135–145)
Total Bilirubin: 0.3 mg/dL (ref 0.0–1.2)
Total Protein: 6.9 g/dL (ref 6.5–8.1)

## 2024-09-21 LAB — CBC WITH DIFFERENTIAL/PLATELET
Abs Immature Granulocytes: 0.15 K/uL — ABNORMAL HIGH (ref 0.00–0.07)
Basophils Absolute: 0 K/uL (ref 0.0–0.1)
Basophils Relative: 0 %
Eosinophils Absolute: 0.1 K/uL (ref 0.0–0.5)
Eosinophils Relative: 1 %
HCT: 37.9 % (ref 36.0–46.0)
Hemoglobin: 11.7 g/dL — ABNORMAL LOW (ref 12.0–15.0)
Immature Granulocytes: 2 %
Lymphocytes Relative: 22 %
Lymphs Abs: 2.1 K/uL (ref 0.7–4.0)
MCH: 28.2 pg (ref 26.0–34.0)
MCHC: 30.9 g/dL (ref 30.0–36.0)
MCV: 91.3 fL (ref 80.0–100.0)
Monocytes Absolute: 0.6 K/uL (ref 0.1–1.0)
Monocytes Relative: 6 %
Neutro Abs: 6.6 K/uL (ref 1.7–7.7)
Neutrophils Relative %: 69 %
Platelets: 241 K/uL (ref 150–400)
RBC: 4.15 MIL/uL (ref 3.87–5.11)
RDW: 18 % — ABNORMAL HIGH (ref 11.5–15.5)
WBC: 9.6 K/uL (ref 4.0–10.5)
nRBC: 0 % (ref 0.0–0.2)

## 2024-09-21 LAB — RESP PANEL BY RT-PCR (RSV, FLU A&B, COVID)  RVPGX2
Influenza A by PCR: NEGATIVE
Influenza B by PCR: NEGATIVE
Resp Syncytial Virus by PCR: NEGATIVE
SARS Coronavirus 2 by RT PCR: NEGATIVE

## 2024-09-21 LAB — PRO BRAIN NATRIURETIC PEPTIDE: Pro Brain Natriuretic Peptide: 463 pg/mL — ABNORMAL HIGH (ref <300.0)

## 2024-09-21 LAB — MAGNESIUM: Magnesium: 2.4 mg/dL (ref 1.7–2.4)

## 2024-09-21 MED ORDER — LEVOTHYROXINE SODIUM 75 MCG PO TABS
75.0000 ug | ORAL_TABLET | Freq: Every day | ORAL | Status: DC
  Administered 2024-09-22 – 2024-09-23 (×2): 75 ug via ORAL
  Filled 2024-09-21 (×2): qty 1

## 2024-09-21 MED ORDER — MONTELUKAST SODIUM 10 MG PO TABS
10.0000 mg | ORAL_TABLET | Freq: Every day | ORAL | Status: DC
  Administered 2024-09-21 – 2024-09-22 (×2): 10 mg via ORAL
  Filled 2024-09-21 (×2): qty 1

## 2024-09-21 MED ORDER — SODIUM CHLORIDE 0.9 % IV SOLN
1.0000 g | Freq: Once | INTRAVENOUS | Status: AC
  Administered 2024-09-21: 1 g via INTRAVENOUS
  Filled 2024-09-21: qty 10

## 2024-09-21 MED ORDER — ONDANSETRON HCL 4 MG/2ML IJ SOLN: 4.0000 mg | Freq: Four times a day (QID) | INTRAMUSCULAR | Status: DC | PRN

## 2024-09-21 MED ORDER — IRBESARTAN 150 MG PO TABS
150.0000 mg | ORAL_TABLET | Freq: Every day | ORAL | Status: DC
  Administered 2024-09-21 – 2024-09-22 (×2): 150 mg via ORAL
  Filled 2024-09-21 (×2): qty 1

## 2024-09-21 MED ORDER — POLYETHYLENE GLYCOL 3350 17 G PO PACK: 17.0000 g | PACK | Freq: Every day | ORAL | Status: DC | PRN

## 2024-09-21 MED ORDER — IPRATROPIUM-ALBUTEROL 0.5-2.5 (3) MG/3ML IN SOLN
3.0000 mL | Freq: Once | RESPIRATORY_TRACT | Status: AC
  Administered 2024-09-21: 3 mL via RESPIRATORY_TRACT
  Filled 2024-09-21: qty 3

## 2024-09-21 MED ORDER — GABAPENTIN 100 MG PO CAPS
100.0000 mg | ORAL_CAPSULE | Freq: Every day | ORAL | Status: DC
  Administered 2024-09-21 – 2024-09-22 (×2): 100 mg via ORAL
  Filled 2024-09-21 (×2): qty 1

## 2024-09-21 MED ORDER — BUDESONIDE 0.25 MG/2ML IN SUSP
0.2500 mg | Freq: Two times a day (BID) | RESPIRATORY_TRACT | Status: DC
Start: 2024-09-21 — End: 2024-09-22
  Administered 2024-09-21 – 2024-09-22 (×2): 0.25 mg via RESPIRATORY_TRACT
  Filled 2024-09-21 (×2): qty 2

## 2024-09-21 MED ORDER — GUAIFENESIN-DM 100-10 MG/5ML PO SYRP
5.0000 mL | ORAL_SOLUTION | ORAL | Status: DC | PRN
  Administered 2024-09-21 – 2024-09-22 (×2): 5 mL via ORAL
  Filled 2024-09-21 (×2): qty 5

## 2024-09-21 MED ORDER — PANTOPRAZOLE SODIUM 40 MG PO TBEC
40.0000 mg | DELAYED_RELEASE_TABLET | Freq: Every day | ORAL | Status: DC
  Administered 2024-09-21 – 2024-09-22 (×2): 40 mg via ORAL
  Filled 2024-09-21 (×2): qty 1

## 2024-09-21 MED ORDER — ACETAMINOPHEN 325 MG PO TABS: 650.0000 mg | ORAL_TABLET | Freq: Four times a day (QID) | ORAL | Status: DC | PRN

## 2024-09-21 MED ORDER — AMLODIPINE BESYLATE 5 MG PO TABS
5.0000 mg | ORAL_TABLET | Freq: Every day | ORAL | Status: DC
  Administered 2024-09-21 – 2024-09-22 (×2): 5 mg via ORAL
  Filled 2024-09-21 (×2): qty 1

## 2024-09-21 MED ORDER — ENOXAPARIN SODIUM 40 MG/0.4ML IJ SOSY
40.0000 mg | PREFILLED_SYRINGE | INTRAMUSCULAR | Status: DC
  Filled 2024-09-21 (×2): qty 0.4

## 2024-09-21 MED ORDER — SODIUM CHLORIDE 0.9 % IV SOLN
500.0000 mg | INTRAVENOUS | Status: AC
  Administered 2024-09-22: 500 mg via INTRAVENOUS
  Filled 2024-09-21: qty 5

## 2024-09-21 MED ORDER — METHYLPREDNISOLONE SODIUM SUCC 125 MG IJ SOLR
125.0000 mg | Freq: Once | INTRAMUSCULAR | Status: AC
  Administered 2024-09-21: 125 mg via INTRAVENOUS
  Filled 2024-09-21: qty 2

## 2024-09-21 MED ORDER — SODIUM CHLORIDE 0.9 % IV SOLN
500.0000 mg | Freq: Once | INTRAVENOUS | Status: AC
  Administered 2024-09-21: 500 mg via INTRAVENOUS
  Filled 2024-09-21: qty 5

## 2024-09-21 MED ORDER — AZITHROMYCIN 250 MG PO TABS
500.0000 mg | ORAL_TABLET | Freq: Every day | ORAL | Status: DC
  Administered 2024-09-23: 500 mg via ORAL
  Filled 2024-09-21: qty 2

## 2024-09-21 MED ORDER — ONDANSETRON HCL 4 MG PO TABS: 4.0000 mg | ORAL_TABLET | Freq: Four times a day (QID) | ORAL | Status: DC | PRN

## 2024-09-21 MED ORDER — ACETAMINOPHEN 650 MG RE SUPP: 650.0000 mg | Freq: Four times a day (QID) | RECTAL | Status: DC | PRN

## 2024-09-21 MED ORDER — ALBUTEROL SULFATE (2.5 MG/3ML) 0.083% IN NEBU
5.0000 mg | INHALATION_SOLUTION | Freq: Once | RESPIRATORY_TRACT | Status: AC
  Administered 2024-09-21: 5 mg via RESPIRATORY_TRACT
  Filled 2024-09-21: qty 6

## 2024-09-21 NOTE — H&P (Signed)
 History and Physical    Bianca Mcguire FMW:[id number] DOB: Sep 15, 1946 DOA: 09/21/2024  PCP: Antonetta Rollene BRAVO, MD   Patient coming from: Home  I have personally briefly reviewed patient's old medical records in [facility]  Chief Complaint: Difficulty breathing  HPI: Bianca Mcguire is a 78 y.o. female with medical history significant for  COPD, HTN, Rheumatoid arthritis. Patient presented to the ED with complaints of difficulty breathing, and cough productive of yellowish sputum that started 3 days ago- 10/10.  She denies chest pain.  No lower extremity swelling.  No fevers no chills.  ED Course: Temperature 97.7.  Heart rate 70s to 87.  Respiratory rate 18-26.  Blood pressure systolic 133-157.  O2 sats down to 83% on room air.  Placed on 2-3 L. Wheezing on arrival to the ED Chest x-ray shows cardiomegaly, without acute disease. Patient given IV Solu-Medrol  125 mg x 1, ceftriaxone and azithromycin , DuoNebs. Hospitalist to admit for acute.  Respiratory failure secondary to COPD exacerbation  Review of Systems: As per HPI all other systems reviewed and negative.  Past Medical History:  Diagnosis Date   Acute pain of right knee 01/30/2011   Qualifier: Diagnosis of  By: Antonetta MD, Dalynn     Anemia    Arthritis    Carpal tunnel syndrome, bilateral    COPD (chronic obstructive pulmonary disease) (HCC)    Diverticulosis dx 2011   GERD (gastroesophageal reflux disease)    Headache    Hypertension 06/02/2018   Hypothyroidism    Low back pain    Obesity    Rheumatoid arthritis(714.0)     Past Surgical History:  Procedure Laterality Date   bilateral foot surgery     COLONOSCOPY  2011   Dr. Harvey: 3mm sessile polyp (tubular adenoma), pancolonic diverticulosis   COLONOSCOPY N/A 09/26/2017   diverticulosis, redundant colon   COLONOSCOPY WITH PROPOFOL  N/A 05/01/2022   Procedure: COLONOSCOPY WITH PROPOFOL ;  Surgeon: Cindie Carlin POUR, DO;  Location: [facility];   Service: Endoscopy;  Laterality: N/A;  11:30am   ESOPHAGOGASTRODUODENOSCOPY N/A 09/16/2017   Dr. Harvey: multiple gastric polyps, (fundic gland), mild gastritis, path with duodenal mucosa with focal villous blunting and surface erosions, moderate chronic gastritis with intestinal metaplasia and reactive changes. no H.pylori, dysplasia, or malignancy   FLEXIBLE SIGMOIDOSCOPY N/A 09/16/2017   Procedure: FLEXIBLE SIGMOIDOSCOPY;  Surgeon: Harvey Margo CROME, MD;  Location: [facility];  Service: Endoscopy;  Laterality: N/A;   HEMORRHOID SURGERY  1980   KNEE ARTHROSCOPY WITH LATERAL MENISECTOMY Right 10/16/2018   Procedure: KNEE ARTHROSCOPY WITH LATERAL MENISCECTOMY;  Surgeon: Margrette Taft BRAVO, MD;  Location: [facility] OR[facility];  Service: Orthopedics;  Laterality: Right;   OOPHORECTOMY Right    roophorectomy for benign disease  02/2010   Dr. Edsel   tonsillectomy and adenoidctomy in childhood     TOTAL KNEE ARTHROPLASTY Right 05/28/2023   Procedure: TOTAL KNEE ARTHROPLASTY;  Surgeon: Margrette Taft BRAVO, MD;  Location: [facility] ORS;  Service: Orthopedics;  Laterality: Right;   TUBAL LIGATION  1977     reports that she quit smoking about 10 years ago. Her smoking use included cigarettes. She started smoking about 35 years ago. She has a 50 pack-year smoking history. She has been exposed to tobacco smoke. She has never used smokeless tobacco. She reports that she does not drink alcohol and does not use drugs.  No Known Allergies  Family History  Problem Relation Age of Onset   Heart failure Mother  Cancer Mother 110       oral   Heart failure Father    Arthritis Sister    Heart attack Brother    Thyroid  disease Daughter    Hypertension Daughter    Hypertension Daughter    Thyroid  disease Daughter    Heart failure Sister    Diabetes Sister    Hypertension Sister    Leukemia Brother    Colon cancer Neg Hx    Colon polyps Neg Hx    Prior to Admission medications   Medication Sig Start Date End Date  Taking? Authorizing Provider  alendronate  (FOSAMAX ) 70 MG tablet TAKE 1 TABLET BY MOUTH WEEKLY  WITH 8 OZ OF PLAIN WATER  30  MINUTES BEFORE FIRST FOOD, DRINK OR MEDS. STAY UPRIGHT FOR 30  MINS 03/20/23   Antonetta Rollene BRAVO, MD  amLODipine  (NORVASC ) 5 MG tablet TAKE 1 TABLET BY MOUTH DAILY 07/24/24   Antonetta Rollene BRAVO, MD  BIOTIN  PO Take 1 tablet by mouth in the morning.    [provider]  Budeson-Glycopyrrol-Formoterol  (BREZTRI  AEROSPHERE) 160-9-4.8 MCG/ACT AERO Inhale 2 puffs into the lungs in the morning and at bedtime. 11/18/23   Darlean Ozell NOVAK, MD  budesonide  (PULMICORT ) 0.25 MG/2ML nebulizer solution One vial twice daily 01/06/24   Wert, Michael B, MD  budesonide -formoterol  (SYMBICORT ) 80-4.5 MCG/ACT inhaler Take 2 puffs first thing in am and then another 2 puffs about 12 hours later. 11/18/23   Darlean Ozell NOVAK, MD  Calcium  Carb-Cholecalciferol  (CALCIUM  600 + D PO) Take 1 tablet by mouth every evening.     [provider]  celecoxib  (CELEBREX ) 200 MG capsule Take 1 capsule (200 mg total) by mouth 2 (two) times daily. 05/29/23   Margrette Taft BRAVO, MD  Cholecalciferol  (VITAMIN D3) 50 MCG (2000 UT) TABS Take 2,000 Units by mouth in the morning.    [provider]  docusate sodium  (COLACE) 100 MG capsule Take 1 capsule (100 mg total) by mouth 2 (two) times daily. 05/29/23   Margrette Taft BRAVO, MD  famotidine  (PEPCID ) 20 MG tablet Take 1 tablet (20 mg total) by mouth at bedtime. 06/17/24   Antonetta Rollene BRAVO, MD  fluticasone  (FLONASE ) 50 MCG/ACT nasal spray SHAKE LIQUID AND USE 2 SPRAYS IN EACH NOSTRIL DAILY Patient taking differently: Place 2 sprays into both nostrils daily as needed for allergies. 04/17/21   Antonetta Rollene BRAVO, MD  folic acid  (FOLVITE ) 800 MCG tablet Take 800 mcg by mouth in the morning.    [provider]  gabapentin  (NEURONTIN ) 100 MG capsule TAKE 1 CAPSULE BY MOUTH AT  BEDTIME 08/18/24   Antonetta Rollene BRAVO, MD  ipratropium-albuterol  (DUONEB)  0.5-2.5 (3) MG/3ML SOLN Take 3 mLs by nebulization every 6 (six) hours as needed (wheezing/shortness of breath.). 01/30/23   Melvenia Manus BRAVO, MD  levothyroxine  (SYNTHROID ) 75 MCG tablet Take 1 tablet (75 mcg total) by mouth daily. 05/14/24   Antonetta Rollene BRAVO, MD  methocarbamol  (ROBAXIN ) 500 MG tablet Take 1 tablet (500 mg total) by mouth every 6 (six) hours as needed for muscle spasms. 05/29/23   Margrette Taft BRAVO, MD  methotrexate (RHEUMATREX) 2.5 MG tablet 6 tabs Orally ONCE A WEEK for 30 days 10/08/22   [provider]  montelukast  (SINGULAIR ) 10 MG tablet TAKE 1 TABLET BY MOUTH AT  BEDTIME 08/18/24   Antonetta Rollene BRAVO, MD  Multiple Vitamins-Iron  (MULTIVITAMINS WITH IRON ) TABS tablet Take 1 tablet by mouth in the morning.    [provider]  olmesartan  (BENICAR ) 20  MG tablet TAKE 1 TABLET(20 MG) BY MOUTH DAILY 08/07/24   Antonetta Rollene BRAVO, MD  omeprazole  (PRILOSEC) 40 MG capsule TAKE 1 CAPSULE BY MOUTH ONCE  DAILY BEFORE BREAKFAST 01/29/24   Antonetta Rollene BRAVO, MD  polyethylene glycol (MIRALAX  / GLYCOLAX ) 17 g packet Take 17 g by mouth daily as needed for mild constipation. 05/29/23   Margrette Taft BRAVO, MD  predniSONE  (DELTASONE ) 10 MG tablet Take 2 daily until better then 1 daily  x 5 days and stop 11/18/23   Darlean Ozell NOVAK, MD  UNABLE TO FIND Nebulizer machine with tubing and supplies. 04/11/21   Antonetta Rollene BRAVO, MD  vitamin B-12 (CYANOCOBALAMIN ) 500 MCG tablet Take 500 mcg by mouth in the morning.    [provider]    Physical Exam: Vitals:   09/21/24 1441 09/21/24 1445 09/21/24 1500 09/21/24 1515  BP:      Pulse: 75 73 78 84  Resp: (!) 24 (!) 21 (!) 23 (!) 26  Temp:      SpO2: 97% 100% 97% 96%  Weight:      Height:        Constitutional: NAD, calm, comfortable Vitals:   09/21/24 1441 09/21/24 1445 09/21/24 1500 09/21/24 1515  BP:      Pulse: 75 73 78 84  Resp: (!) 24 (!) 21 (!) 23 (!) 26  Temp:      SpO2: 97% 100% 97% 96%  Weight:       Height:       Eyes: PERRL, lids and conjunctivae normal ENMT: Mucous membranes are moist.   Neck: normal, supple, no masses, no thyromegaly Respiratory: Bilateral lower lung crackles, no wheezing or rhonchi head, status post bronchodilator treatments, Normal respiratory effort. No accessory muscle use.  Cardiovascular: Regular rate and rhythm, no murmurs / rubs / gallops.  Trace bilateral lower extremity edema.  Abdomen: no tenderness, no masses palpated. No hepatosplenomegaly. Bowel sounds positive.  Musculoskeletal: no clubbing / cyanosis. No joint deformity upper and lower extremities.  Skin: no rashes, lesions, ulcers. No induration Neurologic: No facial asymmetry, moving extremity spontaneously, speech fluent.  Psychiatric: Normal judgment and insight. Alert and oriented x 3. Normal mood.   Labs on Admission: I have personally reviewed following labs and imaging studies  CBC: Recent Labs  Lab 09/21/24 1332  WBC 9.6  NEUTROABS 6.6  HGB 11.7*  HCT 37.9  MCV 91.3  PLT 241   Basic Metabolic Panel: Recent Labs  Lab 09/21/24 1332  NA 143  K 3.8  CL 105  CO2 28  GLUCOSE 113*  BUN 18  CREATININE 0.82  CALCIUM  9.3  MG 2.4   GFR: Estimated Creatinine Clearance: 50.8 mL/min (by C-G formula based on SCr of 0.82 mg/dL). Liver Function Tests: Recent Labs  Lab 09/21/24 1332  AST 20  ALT 9  ALKPHOS 103  BILITOT 0.3  PROT 6.9  ALBUMIN 4.0   BNP (last 3 results) Recent Labs    09/21/24 1332  PROBNP 463.0*   Urine analysis:    Component Value Date/Time   COLORURINE YELLOW 08/04/2019 1736   APPEARANCEUR CLEAR 08/04/2019 1736   LABSPEC 1.026 08/04/2019 1736   PHURINE 6.0 08/04/2019 1736   GLUCOSEU NEGATIVE 08/04/2019 1736   HGBUR NEGATIVE 08/04/2019 1736   HGBUR trace-intact 09/07/2008 0941   BILIRUBINUR NEGATIVE 08/04/2019 1736   KETONESUR NEGATIVE 08/04/2019 1736   PROTEINUR NEGATIVE 08/04/2019 1736   UROBILINOGEN 0.2 09/07/2008 0941   NITRITE NEGATIVE  08/04/2019 1736   LEUKOCYTESUR NEGATIVE 08/04/2019  1736    Radiological Exams on Admission: DG Chest 2 View Result Date: 09/21/2024 CLINICAL DATA:  Cough and shortness of breath. EXAM: CHEST - 2 VIEW COMPARISON:  January 06, 2024 FINDINGS: The cardiac silhouette is mildly enlarged and unchanged in size. Mild chronic appearing increased lung markings are noted. No focal consolidation, pleural effusion or pneumothorax is identified. The visualized skeletal structures are unremarkable. IMPRESSION: Stable cardiomegaly without acute cardiopulmonary disease. Electronically Signed   By: Suzen Dials M.D.   On: 09/21/2024 15:00   EKG: Independently reviewed.  Sinus rhythm, rate 80, QTc 408.  No significant change from prior.  Assessment/Plan Principal Problem:   Acute hypoxic respiratory failure (HCC) Active Problems:   COPD with acute exacerbation (HCC)   Rheumatoid arthritis (HCC)   Essential hypertension  Assessment and Plan:  Acute hypoxic respiratory failure  2/2 COPD exacerbation-O2 sats down to 83% on room air, placed on 2 to 3 L, sats greater than 93%.  Wheezing on arrival to the ED has improved status post DuoNebs. Chest x-ray negative for acute abnormality.  Ongoing tobacco use.  Mildly elevated proBNP 463.  -IV Solu-Medrol  125 mg x 1, continue 60 twice daily - IV ceftriaxone and azithromycin  started, continue with IV azithromycin  - DuoNebs as needed and scheduled -  Mucoltics, flutter valve  Rheumatoid arthritis on methotrexate  Hypertension-stable. - Resume Norvasc , olmesartan   DVT prophylaxis: Lovenox Code Status: FULL Family Communication: None at bedside Disposition Plan: ~ 2 days Consults called: None Admission status: Inpt Tele I certify that at the point of admission it is my clinical judgment that the patient will require inpatient hospital care spanning beyond 2 midnights from the point of admission due to high intensity of service, high risk for further  deterioration and high frequency of surveillance required.   Author: Tully FORBES Carwin, MD 09/21/2024 4:52 PM  For on call review [email or web address].

## 2024-09-21 NOTE — ED Triage Notes (Signed)
 Pt arrived via POV from home after calling her PCP Office and being advised to go to the ER. Pt reports SOB, cough since Friday. Pts O2 Sats 78% on room air in Triage.

## 2024-09-21 NOTE — Plan of Care (Signed)
  Problem: Education: Goal: Knowledge of General Education information will improve Description: Including pain rating scale, medication(s)/side effects and non-pharmacologic comfort measures Outcome: Progressing   Problem: Health Behavior/Discharge Planning: Goal: Ability to manage health-related needs will improve Outcome: Progressing   Problem: Nutrition: Goal: Adequate nutrition will be maintained Outcome: Progressing   Problem: Coping: Goal: Level of anxiety will decrease Outcome: Progressing   Problem: Clinical Measurements: Goal: Ability to maintain clinical measurements within normal limits will improve Outcome: Not Progressing Goal: Diagnostic test results will improve Outcome: Not Progressing Goal: Respiratory complications will improve Outcome: Not Progressing   Problem: Activity: Goal: Risk for activity intolerance will decrease Outcome: Not Progressing

## 2024-09-21 NOTE — Telephone Encounter (Signed)
 Noted. Went to ed

## 2024-09-21 NOTE — ED Provider Notes (Signed)
 [place] EMERGENCY DEPARTMENT AT [facility] Provider Note   CSN: [id number] Arrival date & time: 09/21/24  1256     Patient presents with: No chief complaint on file.   Bianca Mcguire is a 78 y.o. female.   HPI Patient presents for shortness of breath.  Medical history includes HLD, arthritis, osteoporosis, anemia, COPD.  Over the past 3 days, she has had cough and shortness of breath.  Cough has been productive of yellow sputum.  She typically uses 2 nebulized breathing treatments daily.  She was unable to use it today due to her shortness of breath.  She does not wear oxygen  at baseline.    Prior to Admission medications   Medication Sig Start Date End Date Taking? Authorizing Provider  alendronate  (FOSAMAX ) 70 MG tablet TAKE 1 TABLET BY MOUTH WEEKLY  WITH 8 OZ OF PLAIN WATER  30  MINUTES BEFORE FIRST FOOD, DRINK OR MEDS. STAY UPRIGHT FOR 30  MINS 03/20/23   Antonetta Rollene BRAVO, MD  amLODipine  (NORVASC ) 5 MG tablet TAKE 1 TABLET BY MOUTH DAILY 07/24/24   Antonetta Rollene BRAVO, MD  BIOTIN  PO Take 1 tablet by mouth in the morning.    [provider]  Budeson-Glycopyrrol-Formoterol  (BREZTRI  AEROSPHERE) 160-9-4.8 MCG/ACT AERO Inhale 2 puffs into the lungs in the morning and at bedtime. 11/18/23   Darlean Ozell NOVAK, MD  budesonide  (PULMICORT ) 0.25 MG/2ML nebulizer solution One vial twice daily 01/06/24   Wert, Michael B, MD  budesonide -formoterol  (SYMBICORT ) 80-4.5 MCG/ACT inhaler Take 2 puffs first thing in am and then another 2 puffs about 12 hours later. 11/18/23   Darlean Ozell NOVAK, MD  Calcium  Carb-Cholecalciferol  (CALCIUM  600 + D PO) Take 1 tablet by mouth every evening.     [provider]  celecoxib  (CELEBREX ) 200 MG capsule Take 1 capsule (200 mg total) by mouth 2 (two) times daily. 05/29/23   Margrette Taft BRAVO, MD  Cholecalciferol  (VITAMIN D3) 50 MCG (2000 UT) TABS Take 2,000 Units by mouth in the morning.    [provider]  docusate sodium   (COLACE) 100 MG capsule Take 1 capsule (100 mg total) by mouth 2 (two) times daily. 05/29/23   Margrette Taft BRAVO, MD  famotidine  (PEPCID ) 20 MG tablet Take 1 tablet (20 mg total) by mouth at bedtime. 06/17/24   Antonetta Rollene BRAVO, MD  fluticasone  (FLONASE ) 50 MCG/ACT nasal spray SHAKE LIQUID AND USE 2 SPRAYS IN EACH NOSTRIL DAILY Patient taking differently: Place 2 sprays into both nostrils daily as needed for allergies. 04/17/21   Antonetta Rollene BRAVO, MD  folic acid  (FOLVITE ) 800 MCG tablet Take 800 mcg by mouth in the morning.    [provider]  gabapentin  (NEURONTIN ) 100 MG capsule TAKE 1 CAPSULE BY MOUTH AT  BEDTIME 08/18/24   Antonetta Rollene BRAVO, MD  ipratropium-albuterol  (DUONEB) 0.5-2.5 (3) MG/3ML SOLN Take 3 mLs by nebulization every 6 (six) hours as needed (wheezing/shortness of breath.). 01/30/23   Melvenia Manus BRAVO, MD  levothyroxine  (SYNTHROID ) 75 MCG tablet Take 1 tablet (75 mcg total) by mouth daily. 05/14/24   Antonetta Rollene BRAVO, MD  methocarbamol  (ROBAXIN ) 500 MG tablet Take 1 tablet (500 mg total) by mouth every 6 (six) hours as needed for muscle spasms. 05/29/23   Margrette Taft BRAVO, MD  methotrexate (RHEUMATREX) 2.5 MG tablet 6 tabs Orally ONCE A WEEK for 30 days 10/08/22   [provider]  montelukast  (SINGULAIR ) 10 MG tablet TAKE 1 TABLET BY MOUTH AT  BEDTIME 08/18/24  Antonetta Rollene BRAVO, MD  Multiple Vitamins-Iron  (MULTIVITAMINS WITH IRON ) TABS tablet Take 1 tablet by mouth in the morning.    [provider]  olmesartan  (BENICAR ) 20 MG tablet TAKE 1 TABLET(20 MG) BY MOUTH DAILY 08/07/24   Antonetta Rollene BRAVO, MD  omeprazole  (PRILOSEC) 40 MG capsule TAKE 1 CAPSULE BY MOUTH ONCE  DAILY BEFORE BREAKFAST 01/29/24   Antonetta Rollene BRAVO, MD  polyethylene glycol (MIRALAX  / GLYCOLAX ) 17 g packet Take 17 g by mouth daily as needed for mild constipation. 05/29/23   Margrette Taft BRAVO, MD  predniSONE  (DELTASONE ) 10 MG tablet Take 2 daily until better then 1 daily  x 5  days and stop 11/18/23   Darlean Ozell NOVAK, MD  UNABLE TO FIND Nebulizer machine with tubing and supplies. 04/11/21   Antonetta Rollene BRAVO, MD  vitamin B-12 (CYANOCOBALAMIN ) 500 MCG tablet Take 500 mcg by mouth in the morning.    [provider]    Allergies: Patient has no known allergies.    Review of Systems  Constitutional:  Positive for fatigue.  Respiratory:  Positive for cough, chest tightness and shortness of breath.   All other systems reviewed and are negative.   Updated Vital Signs BP 133/63 (BP Location: Right Arm)   Pulse 84   Temp 97.7 F (36.5 C)   Resp (!) 22   Ht 5' 2 (1.575 m)   Wt 67.1 kg   SpO2 97%   BMI 27.06 kg/m   Physical Exam Vitals and nursing note reviewed.  Constitutional:      General: She is not in acute distress.    Appearance: Normal appearance. She is well-developed. She is not toxic-appearing or diaphoretic.  HENT:     Head: Normocephalic and atraumatic.     Right Ear: External ear normal.     Left Ear: External ear normal.     Nose: Nose normal.     Mouth/Throat:     Mouth: Mucous membranes are moist.  Eyes:     Extraocular Movements: Extraocular movements intact.     Conjunctiva/sclera: Conjunctivae normal.  Cardiovascular:     Rate and Rhythm: Normal rate and regular rhythm.  Pulmonary:     Effort: Tachypnea present. No respiratory distress.     Breath sounds: Decreased breath sounds and wheezing present.  Abdominal:     General: There is no distension.     Palpations: Abdomen is soft.     Tenderness: There is no abdominal tenderness.  Musculoskeletal:        General: No swelling. Normal range of motion.     Cervical back: Normal range of motion and neck supple.     Right lower leg: No edema.     Left lower leg: No edema.  Skin:    General: Skin is warm and dry.     Coloration: Skin is not jaundiced or pale.  Neurological:     General: No focal deficit present.     Mental Status: She is alert and oriented to person,  place, and time.  Psychiatric:        Mood and Affect: Mood normal.        Behavior: Behavior normal.     (all labs ordered are listed, but only abnormal results are displayed) Labs Reviewed  CBC WITH DIFFERENTIAL/PLATELET - Abnormal; Notable for the following components:      Result Value   Hemoglobin 11.7 (*)    RDW 18.0 (*)    Abs Immature Granulocytes 0.15 (*)  All other components within normal limits  COMPREHENSIVE METABOLIC PANEL WITH GFR - Abnormal; Notable for the following components:   Glucose, Bld 113 (*)    All other components within normal limits  PRO BRAIN NATRIURETIC PEPTIDE - Abnormal; Notable for the following components:   Pro Brain Natriuretic Peptide 463.0 (*)    All other components within normal limits  RESP PANEL BY RT-PCR (RSV, FLU A&B, COVID)  RVPGX2  MAGNESIUM    EKG: EKG Interpretation Date/Time:  Monday September 21 2024 13:27:32 EDT Ventricular Rate:  80 PR Interval:  142 QRS Duration:  78 QT Interval:  354 QTC Calculation: 408 R Axis:   75  Text Interpretation: Normal sinus rhythm Confirmed by Melvenia Motto [phone]) on 09/21/2024 2:51:42 PM  Radiology: No results found.   Procedures   Medications Ordered in the ED  cefTRIAXone (ROCEPHIN) 1 g in sodium chloride  0.9 % 100 mL IVPB (has no administration in time range)  azithromycin  (ZITHROMAX ) 500 mg in sodium chloride  0.9 % 250 mL IVPB (has no administration in time range)  methylPREDNISolone  sodium succinate (SOLU-MEDROL ) 125 mg/2 mL injection 125 mg (125 mg Intravenous Given 09/21/24 1433)  ipratropium-albuterol  (DUONEB) 0.5-2.5 (3) MG/3ML nebulizer solution 3 mL (3 mLs Nebulization Given 09/21/24 1438)  albuterol  (PROVENTIL ) (2.5 MG/3ML) 0.083% nebulizer solution 5 mg (5 mg Nebulization Given 09/21/24 1438)                                    Medical Decision Making Amount and/or Complexity of Data Reviewed Labs: ordered. Radiology: ordered.  Risk Prescription drug  management.   This patient presents to the ED for concern of cough and shortness of breath, this involves an extensive number of treatment options, and is a complaint that carries with it a high risk of complications and morbidity.  The differential diagnosis includes COPD exacerbation, pneumonia, CHF, URI, bronchitis   Co morbidities / Chronic conditions that complicate the patient evaluation  HLD, arthritis, osteoporosis, anemia, COPD   Additional history obtained:  Additional history obtained from EMR External records from outside source obtained and reviewed including N/A   Lab Tests:  I Ordered, and personally interpreted labs.  The pertinent results include: Normal kidney function, normal electrolytes, no leukocytosis.  BNP mildly elevated.   Imaging Studies ordered:  I ordered imaging studies including chest x-ray I independently visualized and interpreted imaging which showed no acute findings I agree with the radiologist interpretation   Cardiac Monitoring: / EKG:  The patient was maintained on a cardiac monitor.  I personally viewed and interpreted the cardiac monitored which showed an underlying rhythm of: Sinus rhythm   Problem List / ED Course / Critical interventions / Medication management  Patient presenting for cough and shortness of breath over the past 3 days.  She does have a history of COPD.  Her symptoms worsened today to the point that she was unable to do breathing treatments at home.  On arrival in the ED, she is found be hypoxic with SpO2 of 78% on room air.  She is placed on supplemental oxygen .  Patient does not wear oxygen  at baseline.  On exam, patient has mildly increased work of breathing.  She is able to speak in complete sentences.  She does have wheezing present on lung auscultation.  Workup and treatment were initiated.  Given repeat sputum production, antibiotics were ordered.  Lab work is notable for an elevation in  BNP.  On reassessment,  patient resting comfortably on 3 L of supplemental oxygen .  She was admitted for further management. I ordered medication including DuoNeb, albuterol , Solu-Medrol , ceftriaxone, and azithromycin  for COPD exacerbation with increased pain and production Reevaluation of the patient after these medicines showed that the patient improved I have reviewed the patients home medicines and have made adjustments as needed  Social Determinants of Health:  Lives independently  CRITICAL CARE Performed by: Bernardino Fireman   Total critical care time: 31 minutes  Critical care time was exclusive of separately billable procedures and treating other patients.  Critical care was necessary to treat or prevent imminent or life-threatening deterioration.  Critical care was time spent personally by me on the following activities: development of treatment plan with patient and/or surrogate as well as nursing, discussions with consultants, evaluation of patient's response to treatment, examination of patient, obtaining history from patient or surrogate, ordering and performing treatments and interventions, ordering and review of laboratory studies, ordering and review of radiographic studies, pulse oximetry and re-evaluation of patient's condition.      Final diagnoses:  Acute respiratory failure with hypoxia (HCC)  COPD exacerbation [facility])    ED Discharge Orders     None          Fireman Bernardino, MD 09/21/24 1455

## 2024-09-21 NOTE — Telephone Encounter (Signed)
 FYI Only or Action Required?: FYI only for provider.  Patient was last seen in primary care on 06/17/2024 by Antonetta Rollene BRAVO, MD.  Called Nurse Triage reporting Shortness of Breath.  Triage Disposition: Go to ED Now (Notify PCP)  Patient/caregiver understands and will follow disposition?: Yes, will follow disposition  Copied from CRM [id number]. Topic: Clinical - Red Word Triage >> Sep 21, 2024 11:53 AM Delon DASEN wrote: Red Word that prompted transfer to Nurse Triage: trouble breathing, cough, chest congestion, hurts to breathe Reason for Disposition  [1] MODERATE difficulty breathing (e.g., speaks in phrases, SOB even at rest, pulse 100-120) AND [2] NEW-onset or WORSE than normal  Answer Assessment - Initial Assessment Questions 1. RESPIRATORY STATUS: Describe your breathing? (e.g., wheezing, shortness of breath, unable to speak, severe coughing)     SOB, states that she is SOB at rest, while speaking. Pt can be heard taking multiple breaths during sentence while speaking. Advised to go to ED.  Protocols used: Breathing Difficulty-A-AH

## 2024-09-22 DIAGNOSIS — M069 Rheumatoid arthritis, unspecified: Secondary | ICD-10-CM | POA: Diagnosis not present

## 2024-09-22 DIAGNOSIS — I10 Essential (primary) hypertension: Secondary | ICD-10-CM | POA: Diagnosis not present

## 2024-09-22 DIAGNOSIS — J9601 Acute respiratory failure with hypoxia: Secondary | ICD-10-CM | POA: Diagnosis not present

## 2024-09-22 DIAGNOSIS — J441 Chronic obstructive pulmonary disease with (acute) exacerbation: Secondary | ICD-10-CM | POA: Diagnosis not present

## 2024-09-22 MED ORDER — DM-GUAIFENESIN ER 30-600 MG PO TB12
1.0000 | ORAL_TABLET | Freq: Two times a day (BID) | ORAL | Status: DC
  Administered 2024-09-22 – 2024-09-23 (×3): 1 via ORAL
  Filled 2024-09-22 (×3): qty 1

## 2024-09-22 MED ORDER — BUDESONIDE 0.25 MG/2ML IN SUSP
0.5000 mg | Freq: Two times a day (BID) | RESPIRATORY_TRACT | Status: DC
  Administered 2024-09-22 – 2024-09-23 (×2): 0.5 mg via RESPIRATORY_TRACT
  Filled 2024-09-22 (×2): qty 4

## 2024-09-22 MED ORDER — ARFORMOTEROL TARTRATE 15 MCG/2ML IN NEBU
15.0000 ug | INHALATION_SOLUTION | Freq: Two times a day (BID) | RESPIRATORY_TRACT | Status: DC
  Administered 2024-09-22 – 2024-09-23 (×3): 15 ug via RESPIRATORY_TRACT
  Filled 2024-09-22 (×3): qty 2

## 2024-09-22 NOTE — Progress Notes (Signed)
 Mobility Specialist Progress Note:    09/22/24 1430  Mobility  Activity Ambulated with assistance  Level of Assistance Independent  Assistive Device None  Distance Ambulated (ft) 140 ft  Range of Motion/Exercises Active;All extremities  Activity Response Tolerated well  Mobility Referral Yes  Mobility visit 1 Mobility  Mobility Specialist Start Time (ACUTE ONLY) 1430  Mobility Specialist Stop Time (ACUTE ONLY) 1450  Mobility Specialist Time Calculation (min) (ACUTE ONLY) 20 min   Pt received in bed, agreeable to mobility. Independently able to stand and ambulate with no AD. Tolerated well, SpO2 88% on RA at rest. SpO2 88-94% on 2L during ambulation, returned supine. All needs met.  Ashima Shrake Mobility Specialist Please contact via Special educational needs teacher or  Rehab office at [phone]

## 2024-09-22 NOTE — Progress Notes (Signed)
   09/22/24 1401  TOC Brief Assessment  Insurance and Status Reviewed  Patient has primary care physician Yes  Home environment has been reviewed Home alone  Prior level of function: Independent  Prior/Current Home Services No current home services  Social Drivers of Health Review SDOH reviewed no interventions necessary  Readmission risk has been reviewed Yes  Transition of care needs no transition of care needs at this time   Inpatient Care Manager (ICM) has reviewed patient and no ICM needs have been identified at this time. We will continue to monitor patient advancement through interdisciplinary progression rounds. If new patient transition needs arise, please place a ICM consult.

## 2024-09-22 NOTE — Progress Notes (Signed)
  Progress Note   Patient: Bianca Mcguire FMW:[id number] DOB: 1946/05/13 DOA: 09/21/2024     1 DOS: the patient was seen and examined on 09/22/2024   Brief hospital admission narrative: As per H&P written by Dr. Pearlean on 09/21/2024 Bianca Mcguire is a 78 y.o. female with medical history significant for  COPD, HTN, Rheumatoid arthritis. Patient presented to the ED with complaints of difficulty breathing, and cough productive of yellowish sputum that started 3 days ago- 10/10.  She denies chest pain.  No lower extremity swelling.  No fevers no chills.   ED Course: Temperature 97.7.  Heart rate 70s to 87.  Respiratory rate 18-26.  Blood pressure systolic 133-157.  O2 sats down to 83% on room air.  Placed on 2-3 L. Wheezing on arrival to the ED Chest x-ray shows cardiomegaly, without acute disease. Patient given IV Solu-Medrol  125 mg x 1, ceftriaxone and azithromycin , DuoNebs. Hospitalist to admit for acute.  Respiratory failure secondary to COPD exacerbation  Assessment and plan 1-acute hypoxic respiratory failure secondary to COPD exacerbation -O2 sat down to 83% on room air at time of admission -patient do not use oxygen  at home - Currently requiring around 2 L nasal canula supplementation to maintain saturation. -will continue steroids, antibiotics, bronchodilators, mucolytic therapy and flutter valve -wean off oxygen  supplementation as tolerated -desat screening in am -continue singulair    2-HTN -continue current antihypertensive agents  -follow VS  3-hypothyroidism -continue synthroid   4-GERD -continue PPI  5-hx of RA -on methotrexate at home -resume after discharge and follow up with rheumatology service.   Subjective:  Afebrile, no chest pain, no nausea, no vomiting.  Still having ongoing intermittent mildly productive cough is better, diffuse expiratory wheezing and difficulty speaking in full sentences.  Physical Exam: Vitals:   09/22/24 0411 09/22/24 0757  09/22/24 0923 09/22/24 1252  BP: (!) 171/62   (!) 169/61  Pulse: 71   74  Resp: 18   17  Temp: 97.7 F (36.5 C)   97.8 F (36.6 C)  TempSrc: Oral   Oral  SpO2: 93% 93% 94% 91%  Weight:      Height:       General exam: Alert, awake, oriented x 3; in no major distress. Respiratory system: Positive rhonchi, diffuse expiratory wheezing bilaterally and difficulty speaking in full sentences. Cardiovascular system:RRR.no rubs, no gallops, positive murmur. No JVD Gastrointestinal system: Abdomen is nondistended, soft and nontender. No organomegaly or masses felt. Normal bowel sounds heard. Central nervous system: Alert and oriented. No focal neurological deficits. Extremities: No cyanosis, no clubbing; multiple joints with deformities from RA. Skin: No petechiae. Psychiatry: Judgement and insight appear normal. Mood & affect appropriate.    Latest Data Reviewed: Respiratory panel by PCR: Negative for COVID, RSV and influenza. CBC: WBCs 9.6, hemoglobin 11.7 and platelet count 241K Comprehensive metabolic panel: Sodium 143, potassium 3.8, chloride 105, bicarb 28, BUN 18, creatinine 0.82, normal LFTs and GFR >60  Family Communication: no family at bedside  Disposition: Status is: Inpatient Remains inpatient appropriate because: continue IV steroids.  Anticipating discharge back home once medically stable.  Time spent: 50 minutes  Author: Eric Nunnery, MD 09/22/2024 8:25 PM  For on call review [email or web address].

## 2024-09-22 NOTE — Plan of Care (Signed)

## 2024-09-23 DIAGNOSIS — J9601 Acute respiratory failure with hypoxia: Secondary | ICD-10-CM | POA: Diagnosis not present

## 2024-09-23 LAB — CBC
HCT: 35.8 % — ABNORMAL LOW (ref 36.0–46.0)
Hemoglobin: 10.7 g/dL — ABNORMAL LOW (ref 12.0–15.0)
MCH: 27.5 pg (ref 26.0–34.0)
MCHC: 29.9 g/dL — ABNORMAL LOW (ref 30.0–36.0)
MCV: 92 fL (ref 80.0–100.0)
Platelets: 214 K/uL (ref 150–400)
RBC: 3.89 MIL/uL (ref 3.87–5.11)
RDW: 17.9 % — ABNORMAL HIGH (ref 11.5–15.5)
WBC: 8.8 K/uL (ref 4.0–10.5)
nRBC: 0 % (ref 0.0–0.2)

## 2024-09-23 LAB — BASIC METABOLIC PANEL WITH GFR
Anion gap: 7 (ref 5–15)
BUN: 18 mg/dL (ref 8–23)
CO2: 30 mmol/L (ref 22–32)
Calcium: 9 mg/dL (ref 8.9–10.3)
Chloride: 107 mmol/L (ref 98–111)
Creatinine, Ser: 0.57 mg/dL (ref 0.44–1.00)
GFR, Estimated: >60 mL/min (ref >60)
Glucose, Bld: 97 mg/dL (ref 70–99)
Potassium: 4 mmol/L (ref 3.5–5.1)
Sodium: 143 mmol/L (ref 135–145)

## 2024-09-23 LAB — MAGNESIUM: Magnesium: 2.3 mg/dL (ref 1.7–2.4)

## 2024-09-23 MED ORDER — OMEPRAZOLE 40 MG PO CPDR: 40.0000 mg | DELAYED_RELEASE_CAPSULE | Freq: Every evening | ORAL | 2 refills | Status: DC

## 2024-09-23 MED ORDER — GUAIFENESIN-DM 100-10 MG/5ML PO SYRP: 10.0000 mL | ORAL_SOLUTION | Freq: Three times a day (TID) | ORAL | 0 refills | Status: AC

## 2024-09-23 MED ORDER — LEVOFLOXACIN 250 MG PO TABS: 500.0000 mg | ORAL_TABLET | Freq: Every day | ORAL | 0 refills | Status: AC

## 2024-09-23 MED ORDER — METHYLPREDNISOLONE 4 MG PO TBPK: ORAL_TABLET | ORAL | 0 refills | Status: DC

## 2024-09-23 MED ORDER — LACTINEX PO CHEW
1.0000 | CHEWABLE_TABLET | Freq: Three times a day (TID) | ORAL | 0 refills | Status: AC
Start: 2024-09-23 — End: 2024-10-03

## 2024-09-23 NOTE — Hospital Course (Signed)
 Bianca Mcguire is a 79 y.o. female with medical history significant for  COPD, HTN, Rheumatoid arthritis. Patient presented to the ED with complaints of difficulty breathing, and cough productive of yellowish sputum that started 3 days ago- 10/10.  She denies chest pain.  No lower extremity swelling.  No fevers no chills.  ED Course: O2 sats down to 83% on room air. Otherwise stable. Placed on 2-3 L >92%, Was Wheezing on arrival to the ED- Chest x-ray shows cardiomegaly, without acute disease. Patient given IV Solu-Medrol  125 mg x 1, ceftriaxone and azithromycin , DuoNebs. Respiratory panel by PCR: Negative for COVID, RSV and influenza.   Assessment and plan   1-acute hypoxic respiratory failure secondary to COPD exacerbation -O2 sat down to 83% on room air at time of admission -patient do not use oxygen  at home - Currently requiring around 2 L nasal canula supplementation to maintain saturation. -will continue steroids, antibiotics, bronchodilators, mucolytic therapy and flutter valve -wean off oxygen  supplementation as tolerated -desat screening in am -continue singulair     2-HTN -continue current antihypertensive agents  -follow VS   3-hypothyroidism -continue synthroid    4-GERD -continue PPI   5-hx of RA -on methotrexate at home -resume after discharge and follow up with rheumatology service.

## 2024-09-23 NOTE — Progress Notes (Signed)
 Mobility Specialist Progress Note:    09/23/24 0930  Mobility  Activity Ambulated with assistance  Level of Assistance Independent  Assistive Device None  Distance Ambulated (ft) 180 ft  Range of Motion/Exercises Active;All extremities  Activity Response Tolerated well  Mobility Referral Yes  Mobility visit 1 Mobility  Mobility Specialist Start Time (ACUTE ONLY) 0930  Mobility Specialist Stop Time (ACUTE ONLY) 0950  Mobility Specialist Time Calculation (min) (ACUTE ONLY) 20 min   Pt received in bed, agreeable to mobility. Independently able to stand and ambulate with no AD. Tolerated well, SpO2 93% on RA at rest. SpO2 supposedly dropped to 80% during ambulation on RA, (fingers cold and low perforation) pt states she felt fine and no audible SOB. Warmed her fingers up once back to room and O2 89% on RA. Left sitting EOB, all needs met.  Rabab Currington Mobility Specialist Please contact via Special educational needs teacher or  Rehab office at [phone]

## 2024-09-23 NOTE — TOC Transition Note (Signed)
 Transition of Care [facility]) - Discharge Note   Patient Details  Name: Bianca Mcguire MRN: [id number] Date of Birth: [birth year]  [facility]) CM/SW Contact:  Sharlyne Stabs, RN Phone Number: 09/23/2024, 1:47 PM   Clinical Narrative:   Patient discharging home, needing home oxygen .  Referral sent to Zach with [employer]. Team updated they are processing for delivery.    Final next level of care: Home/Self Care Barriers to Discharge: Barriers Resolved   Patient Goals and CMS Choice Patient states their goals for this hospitalization and ongoing recovery are:: return home CMS Medicare.gov Compare Post Acute Care list provided to:: Patient Choice offered to / list presented to : Patient     Discharge Placement         Patient and family notified of of transfer: 09/23/24  Discharge Plan and Services Additional resources added to the After Visit Summary for                  DME Arranged: Oxygen  DME Agency: [employer] Date DME Agency Contacted: 09/23/24 Time DME Agency Contacted: 1326 Representative spoke with at DME Agency: Darlyn     Social Drivers of Health (SDOH) Interventions SDOH Screenings   Food Insecurity: No Food Insecurity (09/21/2024)  Housing: Low Risk  (09/21/2024)  Transportation Needs: No Transportation Needs (09/21/2024)  Utilities: Not At Risk (09/21/2024)  Alcohol Screen: Low Risk  (09/06/2022)  Depression (PHQ2-9): Low Risk  (06/17/2024)  Financial Resource Strain: Low Risk  (09/06/2022)  Physical Activity: Inactive (09/06/2022)  Social Connections: Moderately Integrated (09/21/2024)  Stress: No Stress Concern Present (09/06/2022)  Tobacco Use: Medium Risk (09/21/2024)    Readmission Risk Interventions    09/23/2024    1:41 PM  Readmission Risk Prevention Plan  Post Dischage Appt Not Complete  Medication Screening Complete  Transportation Screening Complete

## 2024-09-23 NOTE — Care Management Important Message (Signed)
 Important Message  Patient Details  Name: Bianca Mcguire MRN: [id number] Date of Birth: 07/11/1946   Important Message Given:  Yes - Medicare IM     Ludwig Tugwell L Faydra Korman 09/23/2024, 12:05 PM

## 2024-09-23 NOTE — Discharge Summary (Signed)
 Physician Discharge Summary   Patient: Bianca Mcguire MRN: [id number] DOB: Apr 28, 1946  Admit date:     09/21/2024  Discharge date: 09/23/24  Discharge Physician: Adriana DELENA Grams   PCP: Antonetta Rollene BRAVO, MD   Recommendations at discharge:   PCP in 1 week  Discharge Diagnoses: Principal Problem:   Acute hypoxic respiratory failure (HCC) Active Problems:   COPD with acute exacerbation (HCC)   Rheumatoid arthritis (HCC)   Essential hypertension  Resolved Problems:   * No resolved hospital problems. *  Hospital Course: Bianca Mcguire is a 78 y.o. female with medical history significant for  COPD, HTN, Rheumatoid arthritis. Patient presented to the ED with complaints of difficulty breathing, and cough productive of yellowish sputum that started 3 days ago- 10/10.  She denies chest pain.  No lower extremity swelling.  No fevers no chills.  ED Course: O2 sats down to 83% on room air. Otherwise stable. Placed on 2-3 L >92%, Was Wheezing on arrival to the ED- Chest x-ray shows cardiomegaly, without acute disease. Patient given IV Solu-Medrol  125 mg x 1, ceftriaxone and azithromycin , DuoNebs. Respiratory panel by PCR: Negative for COVID, RSV and influenza.     1-acute hypoxic respiratory failure secondary to COPD exacerbation POA satting 83% on room air, currently on 2 L, satting greater 96%  -patient do not use oxygen  at home -Status post treatment IV steroids, antibiotics, bronchodilator, DuoNebs flutter valve  -wean off oxygen  supplementation as tolerated -continue singulair     2-HTN -continue current antihypertensive agents  -follow VS   3-hypothyroidism -continue synthroid    4-GERD -continue PPI   5-hx of RA -on methotrexate at home -resume after discharge and follow up with rheumatology service.          Disposition: Home Diet recommendation:  Cardiac and Carb modified diet DISCHARGE MEDICATION: Allergies as of 09/23/2024   No Known Allergies       Medication List     PAUSE taking these medications    methotrexate 2.5 MG tablet Wait to take this until: September 29, 2024 Commonly known as: RHEUMATREX 6 tabs Orally ONCE A WEEK for 30 days       STOP taking these medications    celecoxib  200 MG capsule Commonly known as: CELEBREX    famotidine  20 MG tablet Commonly known as: PEPCID    predniSONE  10 MG tablet Commonly known as: DELTASONE        TAKE these medications    alendronate  70 MG tablet Commonly known as: FOSAMAX  TAKE 1 TABLET BY MOUTH WEEKLY  WITH 8 OZ OF PLAIN WATER  30  MINUTES BEFORE FIRST FOOD, DRINK OR MEDS. STAY UPRIGHT FOR 30  MINS What changed: See the new instructions.   amLODipine  5 MG tablet Commonly known as: NORVASC  TAKE 1 TABLET BY MOUTH DAILY What changed: when to take this   budesonide  0.25 MG/2ML nebulizer solution Commonly known as: Pulmicort  One vial twice daily What changed:  how much to take how to take this when to take this   calcium  carbonate 1500 (600 Ca) MG Tabs tablet Commonly known as: OSCAL Take 600 mg of elemental calcium  by mouth daily with breakfast.   cyanocobalamin  500 MCG tablet Commonly known as: VITAMIN B12 Take 500 mcg by mouth in the morning.   fluticasone  50 MCG/ACT nasal spray Commonly known as: FLONASE  SHAKE LIQUID AND USE 2 SPRAYS IN EACH NOSTRIL DAILY What changed: See the new instructions.   folic acid  800 MCG tablet Commonly known as: FOLVITE  Take 800 mcg by  mouth in the morning.   gabapentin  100 MG capsule Commonly known as: NEURONTIN  TAKE 1 CAPSULE BY MOUTH AT  BEDTIME   guaiFENesin-dextromethorphan 100-10 MG/5ML syrup Commonly known as: ROBITUSSIN DM Take 10 mLs by mouth every 8 (eight) hours.   lactobacillus acidophilus & bulgar chewable tablet Chew 1 tablet by mouth 3 (three) times daily with meals for 10 days.   levofloxacin 250 MG tablet Commonly known as: Levaquin Take 2 tablets (500 mg total) by mouth daily for 5 days.    levothyroxine  75 MCG tablet Commonly known as: SYNTHROID  Take 1 tablet (75 mcg total) by mouth daily.   methylPREDNISolone  4 MG Tbpk tablet Commonly known as: MEDROL  DOSEPAK Medrol  Dosepak take as instructed   montelukast  10 MG tablet Commonly known as: SINGULAIR  TAKE 1 TABLET BY MOUTH AT  BEDTIME   multivitamins with iron  Tabs tablet Take 1 tablet by mouth in the morning.   olmesartan  20 MG tablet Commonly known as: BENICAR  TAKE 1 TABLET(20 MG) BY MOUTH DAILY What changed: See the new instructions.   omeprazole  40 MG capsule Commonly known as: PRILOSEC Take 1 capsule (40 mg total) by mouth every evening. What changed: See the new instructions.   UNABLE TO FIND Nebulizer machine with tubing and supplies.   Vitamin D3 50 MCG (2000 UT) Tabs Take 2,000 Units by mouth in the morning.        Discharge Exam: Filed Weights   09/21/24 1321 09/21/24 1612  Weight: 67.1 kg 67.1 kg       General:  AAO x 3,  cooperative, no distress;   HEENT:  Normocephalic, PERRL, otherwise with in Normal limits   Neuro:  CNII-XII intact. , normal motor and sensation, reflexes intact   Lungs:   Clear to auscultation BL, Respirations unlabored,  No wheezes / crackles  Cardio:    S1/S2, RRR, No murmure, No Rubs or Gallops   Abdomen:  Soft, non-tender, bowel sounds active all four quadrants, no guarding or peritoneal signs.  Muscular  skeletal:  Limited exam -global generalized weaknesses - in bed, able to move all 4 extremities,   2+ pulses,  symmetric, No pitting edema  Skin:  Dry, warm to touch, negative for any Rashes,  Wounds: Please see nursing documentation          Condition at discharge: good  The results of significant diagnostics from this hospitalization (including imaging, microbiology, ancillary and laboratory) are listed below for reference.   Imaging Studies: DG Chest 2 View Result Date: 09/21/2024 CLINICAL DATA:  Cough and shortness of breath. EXAM: CHEST - 2  VIEW COMPARISON:  January 06, 2024 FINDINGS: The cardiac silhouette is mildly enlarged and unchanged in size. Mild chronic appearing increased lung markings are noted. No focal consolidation, pleural effusion or pneumothorax is identified. The visualized skeletal structures are unremarkable. IMPRESSION: Stable cardiomegaly without acute cardiopulmonary disease. Electronically Signed   By: Suzen Dials M.D.   On: 09/21/2024 15:00    Microbiology: Results for orders placed or performed during the hospital encounter of 09/21/24  Resp panel by RT-PCR (RSV, Flu A&B, Covid) Anterior Nasal Swab     Status: None   Collection Time: 09/21/24  1:24 PM   Specimen: Anterior Nasal Swab  Result Value Ref Range Status   SARS Coronavirus 2 by RT PCR NEGATIVE NEGATIVE Final    Comment: (NOTE) SARS-CoV-2 target nucleic acids are NOT DETECTED.  The SARS-CoV-2 RNA is generally detectable in upper respiratory specimens during the acute phase of infection. The lowest  concentration of SARS-CoV-2 viral copies this assay can detect is 138 copies/mL. A negative result does not preclude SARS-Cov-2 infection and should not be used as the sole basis for treatment or other patient management decisions. A negative result may occur with  improper specimen collection/handling, submission of specimen other than nasopharyngeal swab, presence of viral mutation(s) within the areas targeted by this assay, and inadequate number of viral copies(<138 copies/mL). A negative result must be combined with clinical observations, patient history, and epidemiological information. The expected result is Negative.  Fact Sheet for Patients:  BloggerCourse.com  Fact Sheet for Healthcare Providers:  SeriousBroker.it  This test is no t yet approved or cleared by the [place]  FDA and  has been authorized for detection and/or diagnosis of SARS-CoV-2 by FDA under an Emergency Use  Authorization (EUA). This EUA will remain  in effect (meaning this test can be used) for the duration of the COVID-19 declaration under Section 564(b)(1) of the Act, 21 U.S.C.section 360bbb-3(b)(1), unless the authorization is terminated  or revoked sooner.       Influenza A by PCR NEGATIVE NEGATIVE Final   Influenza B by PCR NEGATIVE NEGATIVE Final    Comment: (NOTE) The Xpert Xpress SARS-CoV-2/FLU/RSV plus assay is intended as an aid in the diagnosis of influenza from Nasopharyngeal swab specimens and should not be used as a sole basis for treatment. Nasal washings and aspirates are unacceptable for Xpert Xpress SARS-CoV-2/FLU/RSV testing.  Fact Sheet for Patients: BloggerCourse.com  Fact Sheet for Healthcare Providers: SeriousBroker.it  This test is not yet approved or cleared by the [place]  FDA and has been authorized for detection and/or diagnosis of SARS-CoV-2 by FDA under an Emergency Use Authorization (EUA). This EUA will remain in effect (meaning this test can be used) for the duration of the COVID-19 declaration under Section 564(b)(1) of the Act, 21 U.S.C. section 360bbb-3(b)(1), unless the authorization is terminated or revoked.     Resp Syncytial Virus by PCR NEGATIVE NEGATIVE Final    Comment: (NOTE) Fact Sheet for Patients: BloggerCourse.com  Fact Sheet for Healthcare Providers: SeriousBroker.it  This test is not yet approved or cleared by the [place]  FDA and has been authorized for detection and/or diagnosis of SARS-CoV-2 by FDA under an Emergency Use Authorization (EUA). This EUA will remain in effect (meaning this test can be used) for the duration of the COVID-19 declaration under Section 564(b)(1) of the Act, 21 U.S.C. section 360bbb-3(b)(1), unless the authorization is terminated or revoked.  Performed at [facility], [address],  [place], [place] [address]     Labs: CBC: Recent Labs  Lab 09/21/24 1332 09/23/24 0505  WBC 9.6 8.8  NEUTROABS 6.6  --   HGB 11.7* 10.7*  HCT 37.9 35.8*  MCV 91.3 92.0  PLT 241 214   Basic Metabolic Panel: Recent Labs  Lab 09/21/24 1332 09/23/24 0505  NA 143 143  K 3.8 4.0  CL 105 107  CO2 28 30  GLUCOSE 113* 97  BUN 18 18  CREATININE 0.82 0.57  CALCIUM  9.3 9.0  MG 2.4 2.3   Liver Function Tests: Recent Labs  Lab 09/21/24 1332  AST 20  ALT 9  ALKPHOS 103  BILITOT 0.3  PROT 6.9  ALBUMIN 4.0   CBG: No results for input(s): GLUCAP in the last 168 hours.  Discharge time spent: > 40 minutes.  Signed: Adriana DELENA Grams, MD [facility] 09/23/2024

## 2024-09-23 NOTE — Progress Notes (Signed)
 Mobility Specialist Progress Note:    09/23/24 1235  Mobility  Activity Ambulated with assistance  Level of Assistance Independent  Assistive Device None  Distance Ambulated (ft) 300 ft  Range of Motion/Exercises Active;All extremities  Activity Response Tolerated well  Mobility Referral Yes  Mobility visit 1 Mobility  Mobility Specialist Start Time (ACUTE ONLY) 1235  Mobility Specialist Stop Time (ACUTE ONLY) 1255  Mobility Specialist Time Calculation (min) (ACUTE ONLY) 20 min   Pt received in bed, agreeable to mobility. Independently able to stand and ambulate with no AD. Tolerated well, SpO2 92% on RA at rest. SpO2 dropped to 86% on RA during ambulation, required 2L to bring O2 up to 92-96% during ambulation. Returned supine, all needs met.   Charlyn Vialpando Mobility Specialist Please contact via Special educational needs teacher or  Rehab office at [phone]

## 2024-09-24 ENCOUNTER — Telehealth: Payer: Self-pay

## 2024-09-24 DIAGNOSIS — J9601 Acute respiratory failure with hypoxia: Secondary | ICD-10-CM | POA: Diagnosis not present

## 2024-09-24 DIAGNOSIS — J449 Chronic obstructive pulmonary disease, unspecified: Secondary | ICD-10-CM | POA: Diagnosis not present

## 2024-09-24 NOTE — Transitions of Care (Post Inpatient/ED Visit) (Signed)
 09/24/2024  Name: Bianca Mcguire MRN: [id number] DOB: Jun 25, 1946  Today's TOC FU Call Status: Today's TOC FU Call Status:: Successful TOC FU Call Completed TOC FU Call Complete Date: 09/24/24 Patient's Name and Date of Birth confirmed.  Transition Care Management Follow-up Telephone Call Date of Discharge: 09/23/24 [facility] Type of Discharge: Inpatient Admission Primary Inpatient Discharge Diagnosis:: Acute Hypoxic respiratory failure How have you been since you were released from the hospital?: Better Any questions or concerns?: No  Items Reviewed: Did you receive and understand the discharge instructions provided?: Yes Medications obtained,verified, and reconciled?: Yes (Medications Reviewed) Any new allergies since your discharge?: No Dietary orders reviewed?: Yes Type of Diet Ordered:: Low sodium Heart healthy Do you have support at home?: Yes People in Home [RPT]: grandchild(ren) Name of Support/Comfort Primary Source: Bianca Mcguire  Medications Reviewed Today: Medications Reviewed Today     Reviewed by Bianca Mcguire GRADE, RN (Registered Nurse) on 09/24/24 at 1354  Med List Status: <None>   Medication Order Taking? Sig Documenting Provider Last Dose Status Informant  alendronate  (FOSAMAX ) 70 MG tablet [id number] Yes TAKE 1 TABLET BY MOUTH WEEKLY  WITH 8 OZ OF PLAIN WATER  30  MINUTES BEFORE FIRST FOOD, DRINK OR MEDS. STAY UPRIGHT FOR 30  MINS  Patient taking differently: Take 70 mg by mouth daily. TAKE 1 TABLET BY MOUTH  DAILY WITH 8 OZ OF PLAIN  WATER  30 MINUTES BEFORE  FIRST FOOD, DRINK OR MEDS.  STAY UPRIGHT FOR 30 MINS   Bianca Mcguire  Active Mcguire, Pharmacy Records  amLODipine  (NORVASC ) 5 MG tablet [id number] Yes TAKE 1 TABLET BY MOUTH DAILY  Patient taking differently: Take 5 mg by mouth every evening.   Bianca Mcguire  Active Mcguire, Pharmacy Records  budesonide  (PULMICORT ) 0.25 MG/2ML nebulizer  solution [id number] Yes One vial twice daily  Patient taking differently: Take 0.25 mg by nebulization in the morning and at bedtime. One vial twice daily   Bianca Mcguire  Active Mcguire, Pharmacy Records  bupivacaine -meloxicam  ER (ZYNRELEF ) injection 400 mg [id number]   Margrette Taft BRAVO, Mcguire  Active   calcium  carbonate (OSCAL) 1500 (600 Ca) MG TABS tablet [id number] Yes Take 600 mg of elemental calcium  by mouth daily with breakfast. Provider, Historical, Mcguire  Active Mcguire, Pharmacy Records  Cholecalciferol  (VITAMIN D3) 50 MCG (2000 UT) TABS [id number] Yes Take 2,000 Units by mouth in the morning. Provider, Historical, Mcguire  Active Mcguire, Pharmacy Records  fluticasone  (FLONASE ) 50 MCG/ACT nasal spray [id number] Yes SHAKE LIQUID AND USE 2 SPRAYS IN EACH NOSTRIL DAILY  Patient taking differently: Place 2 sprays into both nostrils daily as needed for allergies.   Bianca Mcguire  Active Mcguire, Pharmacy Records  folic acid  (FOLVITE ) 800 MCG tablet [id number] Yes Take 800 mcg by mouth in the morning. Provider, Historical, Mcguire  Active Mcguire, Pharmacy Records  gabapentin  (NEURONTIN ) 100 MG capsule [id number] Yes TAKE 1 CAPSULE BY MOUTH AT  BEDTIME Bianca Mcguire  Active Mcguire, Pharmacy Records  guaiFENesin-dextromethorphan (ROBITUSSIN DM) 100-10 MG/5ML syrup [id number] Yes Take 10 mLs by mouth every 8 (eight) hours. Bianca Adriana LABOR, Mcguire  Active   lactobacillus acidophilus & bulgar (LACTINEX) chewable tablet [id number] Yes Chew 1 tablet by mouth 3 (three) times daily with meals for 10 days. Bianca Adriana LABOR, Mcguire  Active   levofloxacin (LEVAQUIN) 250 MG tablet [id number] Yes Take 2 tablets (500 mg total) by mouth daily for 5 days. Shahmehdi,  Adriana LABOR, Mcguire  Active   levothyroxine  (SYNTHROID ) 75 MCG tablet [id number] Yes Take 1 tablet (75 mcg total) by mouth daily. Bianca Mcguire  Active Mcguire, Pharmacy Records  methotrexate [facility]) 2.5 MG tablet [id number] Yes 6 tabs Orally ONCE A WEEK for 30  days Provider, Historical, Mcguire  Active Mcguire, Pharmacy Records  methylPREDNISolone  (MEDROL  DOSEPAK) 4 MG TBPK tablet [id number] Yes Medrol  Dosepak take as instructed Bianca Adriana LABOR, Mcguire  Active   montelukast  (SINGULAIR ) 10 MG tablet [id number] Yes TAKE 1 TABLET BY MOUTH AT  BEDTIME Bianca Mcguire  Active Mcguire, Pharmacy Records  Multiple Vitamins-Iron  (MULTIVITAMINS WITH IRON ) TABS tablet [id number] Yes Take 1 tablet by mouth in the morning. Provider, Historical, Mcguire  Active Mcguire, Pharmacy Records  olmesartan  (BENICAR ) 20 MG tablet [id number] Yes TAKE 1 TABLET(20 MG) BY MOUTH DAILY  Patient taking differently: Take 20 mg by mouth every evening.   Bianca Mcguire  Active Mcguire, Pharmacy Records  omeprazole  [facility]) 40 MG capsule [id number] Yes Take 1 capsule (40 mg total) by mouth every evening. Bianca Adriana LABOR, Mcguire  Active   UNABLE TO FIND [id number] Yes Nebulizer machine with tubing and supplies. Bianca Mcguire  Active Mcguire, Pharmacy Records  vitamin B-12 (CYANOCOBALAMIN ) 500 MCG tablet [id number] Yes Take 500 mcg by mouth in the morning. Provider, Historical, Mcguire  Active Mcguire, Pharmacy Records            Home Care and Equipment/Supplies: Were Home Health Services Ordered?: No Any new equipment or medical supplies ordered?: Yes Name of Medical supply agency?: [employer] Were you able to get the equipment/medical supplies?: Yes Do you have any questions related to the use of the equipment/supplies?: No  Functional Questionnaire: Do you need assistance with bathing/showering or dressing?: No Do you need assistance with meal preparation?: No Do you need assistance with eating?: No Do you have difficulty maintaining continence: No Do you need assistance with getting out of bed/getting out of a chair/moving?: No Do you have difficulty managing or taking your medications?: No  Follow up appointments reviewed: PCP Follow-up appointment confirmed?: No (Booked appointment with  PCP office for 10/07/24 at 11:00 AM) Specialist Hospital Follow-up appointment confirmed?: NA Do you need transportation to your follow-up appointment?: No Do you understand care options if your condition(s) worsen?: Yes-patient verbalized understanding  SDOH Interventions Today    Flowsheet Row Most Recent Value  SDOH Interventions   Food Insecurity Interventions Intervention Not Indicated  Housing Interventions Intervention Not Indicated  Transportation Interventions Intervention Not Indicated  Utilities Interventions Intervention Not Indicated    Goals Addressed             This Visit's Progress    VBCI Transitions of Care (TOC) Care Plan       Problems:  Recent Hospitalization for treatment of COPD No Hospital Follow Up Provider appointment Was able to book appointment with    Goal:  Over the next 30 days, the patient will not experience hospital readmission  Interventions:  Reviewed with patient DISCHARGE INSTRUCTIONS Your Primary Care Provider You are allergic to the following Medication Overview Call Mcguire for: redness, tenderness, or signs of infection (pain, swelling, redness, odor or green/yellow discharge around incision site) Call Mcguire for: severe uncontrolled pain Call Mcguire for: temperature >100.4 Diet - low sodium heart healthy Discharge instructions Follow-up with PCP in 1-2 weeks Increase activity slowly Home Oxygen  patient has it and had instructions from [employer] today COPD Interventions: Advised patient to engage  in light exercise as tolerated 3-5 days a week to aid in the the management of COPD Assessed social determinant of health barriers Discussed the importance of adequate rest and management of fatigue with COPD Provided education about and advised patient to utilize infection prevention strategies to reduce risk of respiratory infection Screening for signs and symptoms of depression related to chronic disease state  Use of home oxygen   Patient Mcguire  Care Activities:  Attend all scheduled provider appointments Call pharmacy for medication refills 3-7 days in advance of running out of medications Call provider office for new concerns or questions  Notify RN Care Manager of TOC call rescheduling needs Participate in Transition of Care Program/Attend TOC scheduled calls Take medications as prescribed    Plan:  The care management team will reach out to the patient again over the next 5-7 business days. The patient has been provided with contact information for the care management team and has been advised to call with any health related questions or concerns.  The patient will call PCP as advised to any worsening symptoms.        Discussed and offered 30 day TOC program.  Patient agrees to weekly follow up calls.  The patient has been provided with contact information for the care management team and has been advised to call with any health -related questions or concerns.  The patient verbalized understanding with current plan of care.  The patient is directed to their insurance card regarding availability of benefits coverage.     Mcguire Fish, RN, BSN, CCM [facility], [facility] RN Care Manager Direct Dial: [phone]

## 2024-09-24 NOTE — Patient Instructions (Signed)
 Visit Information  Thank you for taking time to visit with me today. Please don't hesitate to contact me if I can be of assistance to you before our next scheduled telephone appointment.  Our next appointment is by telephone on 10/01/24 at 10:00 AM  Following is a copy of your care plan:   Goals Addressed             This Visit's Progress    VBCI Transitions of Care (TOC) Care Plan       Problems:  Recent Hospitalization for treatment of COPD No Hospital Follow Up Provider appointment Was able to book appointment with    Goal:  Over the next 30 days, the patient will not experience hospital readmission  Interventions:  Reviewed with patient DISCHARGE INSTRUCTIONS Your Primary Care Provider You are allergic to the following Medication Overview Call MD for: redness, tenderness, or signs of infection (pain, swelling, redness, odor or green/yellow discharge around incision site) Call MD for: severe uncontrolled pain Call MD for: temperature >100.4 Diet - low sodium heart healthy Discharge instructions Follow-up with PCP in 1-2 weeks Increase activity slowly Home Oxygen  patient has it and had instructions from [employer] today COPD Interventions: Advised patient to engage in light exercise as tolerated 3-5 days a week to aid in the the management of COPD Assessed social determinant of health barriers Discussed the importance of adequate rest and management of fatigue with COPD Provided education about and advised patient to utilize infection prevention strategies to reduce risk of respiratory infection Screening for signs and symptoms of depression related to chronic disease state  Use of home oxygen   Patient Self Care Activities:  Attend all scheduled provider appointments Call pharmacy for medication refills 3-7 days in advance of running out of medications Call provider office for new concerns or questions  Notify RN Care Manager of TOC call rescheduling needs Participate in  Transition of Care Program/Attend TOC scheduled calls Take medications as prescribed    Plan:  The care management team will reach out to the patient again over the next 5-7 business days. The patient has been provided with contact information for the care management team and has been advised to call with any health related questions or concerns.  The patient will call PCP as advised to any worsening symptoms.         Patient verbalizes understanding of instructions and care plan provided today and agrees to view in MyChart. Active MyChart status and patient understanding of how to access instructions and care plan via MyChart confirmed with patient.     The patient has been provided with contact information for the care management team and has been advised to call with any health related questions or concerns.  The care management team will reach out to the patient again over the next 5-10 business days.   Please call the care guide team at [phone] if you need to cancel or reschedule your appointment.   Please call the [place]  National Suicide Prevention Lifeline: 513-159-0276 or TTY: 412-772-6607 TTY 647-197-2598) to talk to a trained counselor if you are experiencing a Mental Health or Behavioral Health Crisis or need someone to talk to.   Richerd Fish, RN, BSN, CCM [facility], [facility] RN Care Manager Direct Dial: [phone]

## 2024-10-01 ENCOUNTER — Telehealth: Payer: Self-pay

## 2024-10-01 ENCOUNTER — Other Ambulatory Visit: Payer: Self-pay

## 2024-10-01 NOTE — Transitions of Care (Post Inpatient/ED Visit) (Signed)
 Transition of Care week 2  Visit Note  10/01/2024  Name: Bianca Mcguire MRN: [id number]          DOB: [birth year]  Situation: Patient enrolled in [facility] 30-day program. Visit completed with patient by telephone.   Background:   Initial Transition Care Management Follow-up Telephone Call Discharge Date and Diagnosis: 09/23/24, Acute Hypoxic respiratory failure   Past Medical History:  Diagnosis Date   Acute pain of right knee 01/30/2011   Qualifier: Diagnosis of  By: Antonetta MD, Otilia     Anemia    Arthritis    Carpal tunnel syndrome, bilateral    COPD (chronic obstructive pulmonary disease) (HCC)    Diverticulosis dx 2011   GERD (gastroesophageal reflux disease)    Headache    Hypertension 06/02/2018   Hypothyroidism    Low back pain    Obesity    Rheumatoid arthritis(714.0)     Assessment: Patient Reported Symptoms: Cognitive Cognitive Status: No symptoms reported, Able to follow simple commands, Alert and oriented to person, place, and time, Normal speech and language skills      Neurological Neurological Review of Symptoms: No symptoms reported (Stronger)    HEENT HEENT Symptoms Reported: No symptoms reported      Cardiovascular Cardiovascular Symptoms Reported: No symptoms reported    Respiratory Respiratory Symptoms Reported: Productive cough Other Respiratory Symptoms: clearing up Respiratory Management Strategies: Activity, Adequate rest, Medication therapy, Oxygen  therapy, Routine screening Respiratory Self-Management Outcome: 4 (good)  Endocrine Endocrine Symptoms Reported: No symptoms reported Is patient diabetic?: No Endocrine Self-Management Outcome: 4 (good)  Gastrointestinal Gastrointestinal Symptoms Reported: No symptoms reported      Genitourinary Genitourinary Symptoms Reported: No symptoms reported    Integumentary Integumentary Symptoms Reported: No symptoms reported    Musculoskeletal Musculoskelatal Symptoms Reviewed: No symptoms  reported Musculoskeletal Management Strategies: Activity, Adequate rest, Medication therapy Musculoskeletal Self-Management Outcome: 4 (good)      Psychosocial Psychosocial Symptoms Reported: No symptoms reported         There were no vitals filed for this visit.  Medications Reviewed Today     Reviewed by Eilleen Richerd GRADE, RN (Registered Nurse) on 10/01/24 at 1009  Med List Status: <None>   Medication Order Taking? Sig Documenting Provider Last Dose Status Informant  alendronate  (FOSAMAX ) 70 MG tablet [id number]  TAKE 1 TABLET BY MOUTH WEEKLY  WITH 8 OZ OF PLAIN WATER  30  MINUTES BEFORE FIRST FOOD, DRINK OR MEDS. STAY UPRIGHT FOR 30  MINS  Patient taking differently: Take 70 mg by mouth daily. TAKE 1 TABLET BY MOUTH  DAILY WITH 8 OZ OF PLAIN  WATER  30 MINUTES BEFORE  FIRST FOOD, DRINK OR MEDS.  STAY UPRIGHT FOR 30 MINS   Antonetta Rollene BRAVO, MD  Active Self, Pharmacy Records  amLODipine  (NORVASC ) 5 MG tablet [id number]  TAKE 1 TABLET BY MOUTH DAILY  Patient taking differently: Take 5 mg by mouth every evening.   Antonetta Rollene BRAVO, MD  Active Self, Pharmacy Records  budesonide  (PULMICORT ) 0.25 MG/2ML nebulizer solution [id number]  One vial twice daily  Patient taking differently: Take 0.25 mg by nebulization in the morning and at bedtime. One vial twice daily   Wert, Michael B, MD  Active Self, Pharmacy Records  bupivacaine -meloxicam  ER (ZYNRELEF ) injection 400 mg [id number]   Margrette Taft BRAVO, MD  Active   calcium  carbonate (OSCAL) 1500 (600 Ca) MG TABS tablet [id number]  Take 600 mg of elemental calcium  by mouth daily with breakfast. [provider]  Active Self, Pharmacy  Records  Cholecalciferol  (VITAMIN D3) 50 MCG (2000 UT) TABS [id number]  Take 2,000 Units by mouth in the morning. [provider]  Active Self, Pharmacy Records  fluticasone  (FLONASE ) 50 MCG/ACT nasal spray [id number]  SHAKE LIQUID AND USE 2 SPRAYS IN EACH NOSTRIL DAILY  Patient taking differently:  Place 2 sprays into both nostrils daily as needed for allergies.   Antonetta Rollene BRAVO, MD  Active Self, Pharmacy Records  folic acid  (FOLVITE ) 800 MCG tablet [id number]  Take 800 mcg by mouth in the morning. [provider]  Active Self, Pharmacy Records  gabapentin  (NEURONTIN ) 100 MG capsule [id number]  TAKE 1 CAPSULE BY MOUTH AT  BEDTIME Antonetta Rollene BRAVO, MD  Active Self, Pharmacy Records  guaiFENesin-dextromethorphan [facility] DM) 100-10 MG/5ML syrup [id number] Yes Take 10 mLs by mouth every 8 (eight) hours.  Patient taking differently: Take 10 mLs by mouth every 8 (eight) hours. Taking it twice a day currently   Willette Adriana LABOR, MD  Active   lactobacillus acidophilus & bulgar (LACTINEX) chewable tablet [id number]  Chew 1 tablet by mouth 3 (three) times daily with meals for 10 days. Willette Adriana LABOR, MD  Active   levothyroxine  (SYNTHROID ) 75 MCG tablet [id number]  Take 1 tablet (75 mcg total) by mouth daily. Antonetta Rollene BRAVO, MD  Active Self, Pharmacy Records  methotrexate [facility]) 2.5 MG tablet [id number]  6 tabs Orally ONCE A WEEK for 30 days [provider]  Active Self, Pharmacy Records  methylPREDNISolone  (MEDROL  DOSEPAK) 4 MG TBPK tablet [id number]  Medrol  Dosepak take as instructed Willette Adriana LABOR, MD  Active   montelukast  (SINGULAIR ) 10 MG tablet [id number]  TAKE 1 TABLET BY MOUTH AT  BEDTIME Antonetta Rollene BRAVO, MD  Active Self, Pharmacy Records  Multiple Vitamins-Iron  (MULTIVITAMINS WITH IRON ) TABS tablet [id number]  Take 1 tablet by mouth in the morning. [provider]  Active Self, Pharmacy Records  olmesartan  (BENICAR ) 20 MG tablet [id number]  TAKE 1 TABLET(20 MG) BY MOUTH DAILY  Patient taking differently: Take 20 mg by mouth every evening.   Antonetta Rollene BRAVO, MD  Active Self, Pharmacy Records  omeprazole  [facility]) 40 MG capsule [id number]  Take 1 capsule (40 mg total) by mouth every evening. Willette Adriana LABOR, MD  Active   UNABLE TO FIND  [id number]  Nebulizer machine with tubing and supplies. Antonetta Rollene BRAVO, MD  Active Self, Pharmacy Records  vitamin B-12 (CYANOCOBALAMIN ) 500 MCG tablet [id number]  Take 500 mcg by mouth in the morning. [provider]  Active Self, Pharmacy Records           Care gaps were reviewed with patient today and patient verbalized understanding to follow up with primary care provider during office visits and discuss any concerns.  Patient states she has received the flu vaccine, pneumococcal vaccine, RSV on Septemtber 3, 2025 at [facility]. Patient received her 1st [place] on August 26, 2024 and to get second one around 11/17.2025. Encouraged her to have [facility] send to her PCP.  Recommendation:   Continue Current Plan of Care  Follow Up Plan:   Telephone follow-up in 1 week  Richerd Fish, RN, BSN, CCM [facility], [facility] Management Coordinator Direct Dial: [phone]

## 2024-10-01 NOTE — Patient Instructions (Addendum)
 Visit Information  Thank you for taking time to visit with me today. Please don't hesitate to contact me if I can be of assistance to you before our next scheduled telephone appointment.  Our next appointment is by telephone on 10/14/24 at 10:00 AM  Following is a copy of your care plan:   Goals Addressed             This Visit's Progress    VBCI Transitions of Care (TOC) Care Plan   On track    Problems: (Reviewed and updated with patient [phone]) Recent Hospitalization for treatment of COPD No Hospital Follow Up Provider appointment Was able to book appointment with    Goal:  Over the next 30 days, the patient will not experience hospital readmission  Interventions:  Reviewed with patient DISCHARGE INSTRUCTIONS Your Primary Care Provider, follow up with Leita Longs, NP while Dr. Antonetta is out Medication Overview  - Check with pharmacist regarding  Call MD for: redness, tenderness, or signs of infection (pain, swelling, redness, odor or green/yellow discharge around incision site) Call MD for: severe uncontrolled pain Call MD for: temperature >100.4 Diet - low sodium heart healthy Discharge instructions Follow-up with PCP in 1-2 weeks Increase activity slowly - 10/01/24 Patient states tolerating back to activity Home Oxygen  patient has it and had instructions from [employer] today COPD Interventions: Advised patient to engage in light exercise as tolerated 3-5 days a week to aid in the the management of COPD Assessed social determinant of health barriers Discussed the importance of adequate rest and management of fatigue with COPD Provided education about and advised patient to utilize infection prevention strategies to reduce risk of respiratory infection Screening for signs and symptoms of depression related to chronic disease state  Use of home oxygen  09/24/24; [employer] services; 10/01/24 patient states not needed to use it as much this past week only the first week out of the  hospital. Encouraged to follow up with PCP office appointment as well, verbalized understanding.  Patient Self Care Activities:  Attend all scheduled provider appointments Call pharmacy for medication refills 3-7 days in advance of running out of medications Call provider office for new concerns or questions  Notify RN Care Manager of [facility] call rescheduling needs Participate in Transition of Care Program/Attend TOC scheduled calls Take medications as prescribed   10/01/24 Use oxygen  as needed  Plan:  The care management team will reach out to the patient again over the next 5-7 business days. The patient has been provided with contact information for the care management team and has been advised to call with any health related questions or concerns.  The patient will call PCP as advised to any worsening symptoms.  10/01/24 Care gaps were reviewed with patient today and patient verbalized understanding to follow up with primary care provider during office visits and discuss any concerns.         Patient verbalizes understanding of instructions and care plan provided today and agrees to view in MyChart. Active MyChart status and patient understanding of how to access instructions and care plan via MyChart confirmed with patient.     The patient has been provided with contact information for the care management team and has been advised to call with any health related questions or concerns.  Patient to update provider at hospital follow up on vaccinations done at [facility]  Please call the care guide team at [phone] if you need to cancel or reschedule your appointment.   Please call the  [place]  National Suicide Prevention Lifeline: 210-053-2046 or TTY: 608 590 8487 TTY (539)293-7516) to talk to a trained counselor if you are experiencing a Mental Health or Behavioral Health Crisis or need someone to talk to. Richerd Fish, RN, BSN, CCM [facility],  [facility] Management Coordinator Direct Dial: [phone]

## 2024-10-07 ENCOUNTER — Telehealth: Payer: Self-pay

## 2024-10-07 ENCOUNTER — Ambulatory Visit

## 2024-10-07 VITALS — BP 122/60 | HR 73 | Ht 62.0 in | Wt 148.0 lb

## 2024-10-07 DIAGNOSIS — M069 Rheumatoid arthritis, unspecified: Secondary | ICD-10-CM | POA: Diagnosis not present

## 2024-10-07 DIAGNOSIS — J9601 Acute respiratory failure with hypoxia: Secondary | ICD-10-CM | POA: Diagnosis not present

## 2024-10-07 DIAGNOSIS — J449 Chronic obstructive pulmonary disease, unspecified: Secondary | ICD-10-CM

## 2024-10-07 NOTE — Telephone Encounter (Signed)
 Copied from CRM [id number]. Topic: General - Other >> Oct 07, 2024  1:12 PM Rosaria E wrote: Reason for CRM: Pt called to report that [employer] needs a fax from the office prior to picking up her oxygen  equipment from her home. Please advise, every day that she keeps it she has to pay for so she is asking for this to be done urgently.   Best contact: [phone] ([employer])

## 2024-10-07 NOTE — Progress Notes (Signed)
 Established Patient Office Visit  Subjective   Patient ID: Bianca Mcguire, female    DOB: 02-12-1946  Age: 78 y.o. MRN: [id number]  Chief Complaint  Patient presents with   Hospitalization Follow-up    Hospital Follow up    HPI Admitted to [facility] 10/13-10/15/25: Discharge Diagnoses: Principal Problem:   Acute hypoxic respiratory failure (HCC) Active Problems:   COPD with acute exacerbation (HCC)   Rheumatoid arthritis (HCC)   Essential hypertension   Resolved Problems:   * No resolved hospital problems. *   Hospital Course: Bianca Mcguire is a 78 y.o. female with medical history significant for  COPD, HTN, Rheumatoid arthritis. Patient presented to the ED with complaints of difficulty breathing, and cough productive of yellowish sputum that started 3 days ago- 10/10.  She denies chest pain.  No lower extremity swelling.  No fevers no chills.   ED Course: O2 sats down to 83% on room air. Otherwise stable. Placed on 2-3 L >92%, Was Wheezing on arrival to the ED- Chest x-ray shows cardiomegaly, without acute disease. Patient given IV Solu-Medrol  125 mg x 1, ceftriaxone and azithromycin , DuoNebs. Respiratory panel by PCR: Negative for COVID, RSV and influenza.      1-acute hypoxic respiratory failure secondary to COPD exacerbation POA satting 83% on room air, currently on 2 L, satting greater 96%   -patient do not use oxygen  at home -Status post treatment IV steroids, antibiotics, bronchodilator, DuoNebs flutter valve   -wean off oxygen  supplementation as tolerated -continue singulair     2-HTN -continue current antihypertensive agents  -follow VS   3-hypothyroidism -continue synthroid    4-GERD -continue PPI   5-hx of RA -on methotrexate at home -resume after discharge and follow up with rheumatology service.  Past Medical History:  Diagnosis Date   Acute pain of right knee 01/30/2011   Qualifier: Diagnosis of  By: Antonetta MD, Baili     Anemia     Arthritis    Carpal tunnel syndrome, bilateral    COPD (chronic obstructive pulmonary disease) (HCC)    Diverticulosis dx 2011   GERD (gastroesophageal reflux disease)    Headache    Hypertension 06/02/2018   Hypothyroidism    Low back pain    Obesity    Rheumatoid arthritis(714.0)     ROS    Objective:     BP 122/60 (BP Location: Right Arm, Patient Position: Sitting, Cuff Size: Normal)   Pulse 73   Ht 5' 2 (1.575 m)   Wt 148 lb (67.1 kg)   SpO2 93%   BMI 27.07 kg/m  BP Readings from Last 3 Encounters:  10/07/24 122/60  09/24/24 (!) 132/52  09/23/24 (!) 132/52   Wt Readings from Last 3 Encounters:  10/07/24 148 lb (67.1 kg)  09/21/24 147 lb 14.9 oz (67.1 kg)  08/05/24 148 lb (67.1 kg)     Physical Exam Vitals and nursing note reviewed.  Constitutional:      Appearance: Normal appearance.  HENT:     Head: Normocephalic.  Eyes:     Extraocular Movements: Extraocular movements intact.     Pupils: Pupils are equal, round, and reactive to light.  Cardiovascular:     Rate and Rhythm: Normal rate and regular rhythm.  Pulmonary:     Effort: Pulmonary effort is normal.     Breath sounds: Normal breath sounds.  Musculoskeletal:     Cervical back: Normal range of motion and neck supple.  Neurological:     Mental Status: She  is alert and oriented to person, place, and time.  Psychiatric:        Mood and Affect: Mood normal.        Thought Content: Thought content normal.   No results found for any visits on 10/07/24.    The 10-year ASCVD risk score (Arnett DK, et al., 2019) is: 26.6%    Assessment & Plan:   Problem List Items Addressed This Visit       Respiratory   COPD (chronic obstructive pulmonary disease) (HCC) - Primary   COPD exacerbation likely due to seasonal changes. Recent hospitalization for severe bronchitis. Oxygen  saturation stable without supplemental oxygen . - Wean off supplemental oxygen  as tolerated. - Contact home health to return  oxygen  equipment. - Verify referral to pulmonologist and ensure follow-up.       No follow-ups on file.    Leita Longs, FNP

## 2024-10-12 ENCOUNTER — Other Ambulatory Visit: Payer: Self-pay | Admitting: Family Medicine

## 2024-10-12 ENCOUNTER — Encounter: Payer: Self-pay | Admitting: Radiology

## 2024-10-13 NOTE — Assessment & Plan Note (Signed)
 COPD exacerbation likely due to seasonal changes. Recent hospitalization for severe bronchitis. Oxygen  saturation stable without supplemental oxygen . - Wean off supplemental oxygen  as tolerated. - Contact home health to return oxygen  equipment. - Verify referral to pulmonologist and ensure follow-up.

## 2024-10-14 ENCOUNTER — Other Ambulatory Visit: Payer: Self-pay

## 2024-10-14 ENCOUNTER — Telehealth: Payer: Self-pay

## 2024-10-14 NOTE — Telephone Encounter (Signed)
 Done  Faxed  Copy at my desk

## 2024-10-14 NOTE — Telephone Encounter (Signed)
 Letter printed to fax to [employer]

## 2024-10-14 NOTE — Patient Instructions (Addendum)
 Visit Information  Thank you for taking time to visit with me today. Please don't hesitate to contact me if I can be of assistance to you before our next scheduled telephone appointment.  Our next appointment is by telephone on 10/23/24 at 10:00 AM  Following is a copy of your care plan:   Goals Addressed             This Visit's Progress    [facility] Transitions of Care (TOC) Care Plan   On track    Problems: (Reviewed and updated with patient [phone]) Recent Hospitalization for treatment of COPD No Hospital Follow Up Provider appointment Was able to book appointment with Leita Mounts, NP    Goal:  Over the next 30 days, the patient will not experience hospital readmission  Interventions:  Reviewed with patient DISCHARGE INSTRUCTIONS Your Primary Care Provider, follow up with Leita Longs, NP while Dr. Antonetta is out Medication Overview  - Check with pharmacist regarding  Call MD for: redness, tenderness, or signs of infection (pain, swelling, redness, odor or green/yellow discharge around incision site) Call MD for: severe uncontrolled pain Call MD for: temperature >100.4 Diet - low sodium heart healthy Discharge instructions Follow-up with PCP in 1-2 weeks - completed  Increase activity slowly - 10/01/24 Patient states tolerating back to activity Home Oxygen  patient has it and had instructions from [employer] today - completed   COPD Interventions: Advised patient to engage in light exercise as tolerated 3-5 days a week to aid in the the management of COPD Assessed social determinant of health barriers Discussed the importance of adequate rest and management of fatigue with COPD Provided education about and advised patient to utilize infection prevention strategies to reduce risk of respiratory infection Screening for signs and symptoms of depression related to chronic disease state  Use of home oxygen  09/24/24; [employer] services; 10/01/24 patient states not needed to use it as much  this past week only the first week out of the hospital. Encouraged to follow up with PCP office appointment as well, verbalized understanding. 10/14/24 Encourage to monitor pulse oximetry from time to time, oxygen  was discontinued  Patient Self Care Activities:  Attend all scheduled provider appointments Call pharmacy for medication refills 3-7 days in advance of running out of medications Call provider office for new concerns or questions  Notify RN Care Manager of [facility] call rescheduling needs Participate in Transition of Care Program/Attend TOC scheduled calls Take medications as prescribed   10/01/24 Use oxygen  as needed 10/14/24 Oxygen  was discontinue, monitor self for symptoms also to monitor BP  Plan:  The care management team will reach out to the patient again over the next 5-7 business days. The patient has been provided with contact information for the care management team and has been advised to call with any health related questions or concerns.  The patient will call PCP as advised to any worsening symptoms.  10/01/24 Care gaps were reviewed with patient today and patient verbalized understanding to follow up with primary care provider during office visits and discuss any concerns.  10/14/24 Continue care plan and follow up with PCP if symptoms increase. Verbalized understanding.         Patient verbalizes understanding of instructions and care plan provided today and agrees to view in MyChart. Active MyChart status and patient understanding of how to access instructions and care plan via MyChart confirmed with patient.     Telephone follow up appointment with care management team member scheduled for: The patient  has been provided with contact information for the care management team and has been advised to call with any health related questions or concerns.   Please call the care guide team at [phone] if you need to cancel or reschedule your appointment.   Please call  the [place]  National Suicide Prevention Lifeline: 8621126245 or TTY: [phone] TTY [phone]) to talk to a trained counselor if you are experiencing a Mental Health or Behavioral Health Crisis or need someone to talk to.  Richerd Fish, RN, BSN, CCM [facility], [facility] Management Coordinator Direct Dial: [phone]

## 2024-10-14 NOTE — Transitions of Care (Post Inpatient/ED Visit) (Signed)
 Transition of Care week 3  Visit Note  10/14/2024  Name: Bianca Mcguire MRN: [id number]          DOB: [birth year]  Situation: Patient enrolled in [facility] 30-day program. Visit completed with patient by telephone.   Background:   Initial Transition Care Management Follow-up Telephone Call Discharge Date and Diagnosis: 09/23/24, Acute Hypoxic respiratory failure   Past Medical History:  Diagnosis Date   Acute pain of right knee 01/30/2011   Qualifier: Diagnosis of  By: Antonetta MD, Nevaen     Anemia    Arthritis    Carpal tunnel syndrome, bilateral    COPD (chronic obstructive pulmonary disease) (HCC)    Diverticulosis dx 2011   GERD (gastroesophageal reflux disease)    Headache    Hypertension 06/02/2018   Hypothyroidism    Low back pain    Obesity    Rheumatoid arthritis(714.0)     Assessment: Patient Reported Symptoms: Cognitive Cognitive Status: No symptoms reported, Able to follow simple commands, Alert and oriented to person, place, and time, Normal speech and language skills      Neurological Neurological Review of Symptoms: No symptoms reported Neurological Self-Management Outcome: 4 (good)  HEENT HEENT Symptoms Reported: No symptoms reported HEENT Self-Management Outcome: 4 (good)    Cardiovascular Cardiovascular Symptoms Reported: No symptoms reported    Respiratory Respiratory Symptoms Reported: Productive cough Other Respiratory Symptoms: some sneezing Respiratory Management Strategies: Activity, Adequate rest, Medication therapy (oxygen  is no longer needed) Respiratory Self-Management Outcome: 4 (good)  Endocrine Endocrine Symptoms Reported: No symptoms reported Is patient diabetic?: No Endocrine Self-Management Outcome: 4 (good)  Gastrointestinal Gastrointestinal Symptoms Reported: No symptoms reported      Genitourinary Genitourinary Symptoms Reported: No symptoms reported    Integumentary Integumentary Symptoms Reported: No symptoms reported Skin  Self-Management Outcome: 4 (good)  Musculoskeletal Musculoskelatal Symptoms Reviewed: No symptoms reported Musculoskeletal Management Strategies: Activity, Adequate rest, Medication therapy Musculoskeletal Self-Management Outcome: 4 (good)      Psychosocial Psychosocial Symptoms Reported: No symptoms reported         There were no vitals filed for this visit.  Medications Reviewed Today     Reviewed by Eilleen Richerd GRADE, RN (Registered Nurse) on 10/14/24 at 1006  Med List Status: <None>   Medication Order Taking? Sig Documenting Provider Last Dose Status Informant  alendronate  (FOSAMAX ) 70 MG tablet [id number]  TAKE 1 TABLET BY MOUTH WEEKLY  WITH 8 OZ OF PLAIN WATER  30  MINUTES BEFORE FIRST FOOD, DRINK OR MEDS. STAY UPRIGHT FOR 30  MINS  Patient taking differently: Take 70 mg by mouth daily. TAKE 1 TABLET BY MOUTH  DAILY WITH 8 OZ OF PLAIN  WATER  30 MINUTES BEFORE  FIRST FOOD, DRINK OR MEDS.  STAY UPRIGHT FOR 30 MINS   Antonetta Rollene BRAVO, MD  Active Self, Pharmacy Records  amLODipine  (NORVASC ) 5 MG tablet [id number]  TAKE 1 TABLET BY MOUTH DAILY  Patient taking differently: Take 5 mg by mouth every evening.   Antonetta Rollene BRAVO, MD  Active Self, Pharmacy Records  budesonide  (PULMICORT ) 0.25 MG/2ML nebulizer solution [id number]  One vial twice daily  Patient taking differently: Take 0.25 mg by nebulization in the morning and at bedtime. One vial twice daily   Wert, Michael B, MD  Active Self, Pharmacy Records  bupivacaine -meloxicam  ER (ZYNRELEF ) injection 400 mg [id number]   Margrette Taft BRAVO, MD  Active   calcium  carbonate (OSCAL) 1500 (600 Ca) MG TABS tablet [id number]  Take 600 mg of elemental calcium  by mouth daily  with breakfast. [provider]  Active Self, Pharmacy Records  Cholecalciferol  (VITAMIN D3) 50 MCG (2000 UT) TABS [id number]  Take 2,000 Units by mouth in the morning. [provider]  Active Self, Pharmacy Records  fluticasone  (FLONASE ) 50 MCG/ACT nasal  spray [id number]  SHAKE LIQUID AND USE 2 SPRAYS IN EACH NOSTRIL DAILY  Patient taking differently: Place 2 sprays into both nostrils daily as needed for allergies.   Antonetta Rollene BRAVO, MD  Active Self, Pharmacy Records  folic acid  (FOLVITE ) 800 MCG tablet [id number]  Take 800 mcg by mouth in the morning. [provider]  Active Self, Pharmacy Records  gabapentin  (NEURONTIN ) 100 MG capsule [id number]  TAKE 1 CAPSULE BY MOUTH AT  BEDTIME Antonetta Rollene BRAVO, MD  Active Self, Pharmacy Records  guaiFENesin-dextromethorphan [facility] DM) 100-10 MG/5ML syrup [id number]  Take 10 mLs by mouth every 8 (eight) hours.  Patient taking differently: Take 10 mLs by mouth every 8 (eight) hours. Taking it twice a day currently   Willette Adriana LABOR, MD  Active   levothyroxine  (SYNTHROID ) 75 MCG tablet [id number]  Take 1 tablet (75 mcg total) by mouth daily. Antonetta Rollene BRAVO, MD  Active Self, Pharmacy Records  methotrexate [facility]) 2.5 MG tablet [id number]  6 tabs Orally ONCE A WEEK for 30 days [provider]  Active Self, Pharmacy Records  montelukast  (SINGULAIR ) 10 MG tablet [id number]  TAKE 1 TABLET BY MOUTH AT  BEDTIME Antonetta Rollene BRAVO, MD  Active Self, Pharmacy Records  Multiple Vitamins-Iron  (MULTIVITAMINS WITH IRON ) TABS tablet [id number]  Take 1 tablet by mouth in the morning. [provider]  Active Self, Pharmacy Records  olmesartan  (BENICAR ) 20 MG tablet [id number]  TAKE 1 TABLET(20 MG) BY MOUTH DAILY  Patient taking differently: Take 20 mg by mouth every evening.   Antonetta Rollene BRAVO, MD  Active Self, Pharmacy Records  omeprazole  [facility]) 40 MG capsule [id number]  TAKE 1 CAPSULE BY MOUTH ONCE  DAILY BEFORE BREAKFAST Antonetta Rollene BRAVO, MD  Active   UNABLE TO FIND [id number]  Nebulizer machine with tubing and supplies. Antonetta Rollene BRAVO, MD  Active Self, Pharmacy Records  vitamin B-12 (CYANOCOBALAMIN ) 500 MCG tablet [id number]  Take 500 mcg by mouth in the morning.  [provider]  Active Self, Pharmacy Records            Recommendation:   Continue Current Plan of Care  Follow Up Plan:   Telephone follow-up in 1 week  Richerd Fish, RN, SCIENTIST, RESEARCH (PHYSICAL SCIENCES), CCM [facility], [facility] Management Coordinator Direct Dial: [phone]

## 2024-10-19 ENCOUNTER — Telehealth: Payer: Self-pay | Admitting: Family Medicine

## 2024-10-19 NOTE — Telephone Encounter (Signed)
 Copied from CRM #[id number]. Topic: General - Other >> Oct 19, 2024 11:08 AM Wess RAMAN wrote: Reason for CRM: Patient states she was on oxygen  2 weeks ago. She needs Dr. Antonetta to send a letter to the oxygen  company ([employer]) stating she does not need it anymore  Fax #: [phone]

## 2024-10-19 NOTE — Telephone Encounter (Signed)
 Based on 10/07/2024 office visit  on record , pt was stable WITHOUT oxygen  at tha t time , pls write letter requested to [employer] stating that shj no longer needs oxygen  , as of 10/07/2024  I will sign

## 2024-10-20 ENCOUNTER — Other Ambulatory Visit: Payer: Self-pay | Admitting: Family Medicine

## 2024-10-20 NOTE — Telephone Encounter (Signed)
Faxed letter to [employer].

## 2024-10-22 ENCOUNTER — Telehealth: Payer: Self-pay | Admitting: Family Medicine

## 2024-10-22 NOTE — Telephone Encounter (Signed)
 Copied from CRM #[id number]. Topic: General - Other >> Oct 19, 2024 11:08 AM Wess RAMAN wrote: Reason for CRM: Patient states she was on oxygen  2 weeks ago. She needs Dr. Antonetta to send a letter to the oxygen  company ([employer]) stating she does not need it anymore  Fax #: [phone] >> Oct 22, 2024 11:54 AM Zebedee SAUNDERS wrote: Pt is being charged for oxygen  and needs letter to stop prescription faxed to: [phone]. Please call pt to confirm [phone]

## 2024-10-22 NOTE — Telephone Encounter (Signed)
 Please advise.  Copied from CRM #[id number]. Topic: General - Other >> Oct 19, 2024 11:08 AM Wess RAMAN wrote: Reason for CRM: Patient states she was on oxygen  2 weeks ago. She needs Dr. Antonetta to send a letter to the oxygen  company ([employer]) stating she does not need it anymore  Fax #: [phone] >> Oct 22, 2024 11:54 AM Zebedee SAUNDERS wrote: Pt is being charged for oxygen  and needs letter to stop prescription faxed to: [phone]. Please call pt to confirm [phone]

## 2024-10-22 NOTE — Telephone Encounter (Signed)
 This has been completed and confirmed by [employer]

## 2024-10-23 ENCOUNTER — Telehealth: Payer: Self-pay

## 2024-10-26 ENCOUNTER — Telehealth: Payer: Self-pay

## 2024-10-26 NOTE — Transitions of Care (Post Inpatient/ED Visit) (Signed)
 Care Management  Transitions of Care Program Transitions of Care Post-discharge week 4  10/26/2024 Name: Bianca Mcguire MRN: [id number] DOB: December 05, 1946  Subjective: Bianca Mcguire is a 78 y.o. year old female who is a primary care patient of Myiesha, Edgar, MD. The Care Management team was unable to reach the patient by phone to assess and address transitions of care needs.   Plan: Additional outreach attempts will be made to reach the patient enrolled in the [facility] (Post Inpatient/ED Visit).  Richerd Fish, RN, BSN, CCM [facility], [facility] Management Coordinator Direct Dial: [phone]

## 2024-11-17 ENCOUNTER — Ambulatory Visit: Admitting: Family Medicine

## 2024-11-19 ENCOUNTER — Ambulatory Visit

## 2024-11-19 ENCOUNTER — Other Ambulatory Visit: Payer: Self-pay | Admitting: Family Medicine

## 2024-11-19 VITALS — Ht 62.0 in | Wt 152.0 lb

## 2024-11-19 DIAGNOSIS — F17210 Nicotine dependence, cigarettes, uncomplicated: Secondary | ICD-10-CM

## 2024-11-19 DIAGNOSIS — Z0001 Encounter for general adult medical examination with abnormal findings: Secondary | ICD-10-CM

## 2024-11-19 DIAGNOSIS — Z1231 Encounter for screening mammogram for malignant neoplasm of breast: Secondary | ICD-10-CM

## 2024-11-19 DIAGNOSIS — Z780 Asymptomatic menopausal state: Secondary | ICD-10-CM | POA: Diagnosis not present

## 2024-11-19 DIAGNOSIS — Z0000 Encounter for general adult medical examination without abnormal findings: Secondary | ICD-10-CM

## 2024-11-19 NOTE — Progress Notes (Signed)
 Chief Complaint  Patient presents with   Medicare Wellness     Subjective:   Bianca Mcguire is a 78 y.o. female who presents for a Medicare Annual Wellness Visit.  Visit info / Clinical Intake: Medicare Wellness Visit Type:: Subsequent Annual Wellness Visit Persons participating in visit and providing information:: patient Medicare Wellness Visit Mode:: Video Since this visit was completed virtually, some vitals may be partially provided or unavailable. Missing vitals are due to the limitations of the virtual format.: Documented vitals are patient reported If Telephone or Video please confirm:: I connected with patient using audio/video enable telemedicine. I verified patient identity with two identifiers, discussed telehealth limitations, and patient agreed to proceed. Patient Location:: home Provider Location:: office Interpreter Needed?: No Pre-visit prep was completed: yes AWV questionnaire completed by patient prior to visit?: no Living arrangements:: (!) lives alone Patient's Overall Health Status Rating: very good Typical amount of pain: none Does pain affect daily life?: no Are you currently prescribed opioids?: no  Dietary Habits and Nutritional Risks How many meals a day?: 2 Eats fruit and vegetables daily?: yes Most meals are obtained by: preparing own meals In the last 2 weeks, have you had any of the following?: none Diabetic:: no  Functional Status Activities of Daily Living (to include ambulation/medication): Independent Ambulation: Independent Medication Administration: Independent Home Management (perform basic housework or laundry): Independent Manage your own finances?: yes Primary transportation is: driving Concerns about vision?: no *vision screening is required for WTM* Concerns about hearing?: no  Fall Screening Falls in the past year?: 0 Number of falls in past year: 0 Was there an injury with Fall?: 0 Fall Risk Category Calculator:  0 Patient Fall Risk Level: Low Fall Risk  Fall Risk Patient at Risk for Falls Due to: No Fall Risks Fall risk Follow up: Falls evaluation completed; Education provided; Falls prevention discussed  Home and Transportation Safety: All rugs have non-skid backing?: yes All stairs or steps have railings?: yes Grab bars in the bathtub or shower?: yes Have non-skid surface in bathtub or shower?: yes Good home lighting?: yes Regular seat belt use?: yes Hospital stays in the last year:: (!) yes How many hospital stays:: 2 Reason: knee replacement, breathing difficulty  Cognitive Assessment Difficulty concentrating, remembering, or making decisions? : no Will 6CIT or Mini Cog be Completed: no 6CIT or Mini Cog Declined: patient alert, oriented, able to answer questions appropriately and recall recent events  Advance Directives (For Healthcare) Does Patient Have a Medical Advance Directive?: No Would patient like information on creating a medical advance directive?: Yes (MAU/Ambulatory/Procedural Areas - Information given)  Reviewed/Updated  Reviewed/Updated: Reviewed All (Medical, Surgical, Family, Medications, Allergies, Care Teams, Patient Goals)    Allergies (verified) Patient has no known allergies.   Current Medications (verified) Outpatient Encounter Medications as of 11/19/2024  Medication Sig   alendronate  (FOSAMAX ) 70 MG tablet TAKE 1 TABLET BY MOUTH WEEKLY  WITH 8 OZ OF PLAIN WATER  30  MINUTES BEFORE FIRST FOOD, DRINK OR MEDS. STAY UPRIGHT FOR 30  MINS (Patient taking differently: Take 70 mg by mouth daily. TAKE 1 TABLET BY MOUTH  DAILY WITH 8 OZ OF PLAIN  WATER  30 MINUTES BEFORE  FIRST FOOD, DRINK OR MEDS.  STAY UPRIGHT FOR 30 MINS)   amLODipine  (NORVASC ) 5 MG tablet TAKE 1 TABLET BY MOUTH DAILY (Patient taking differently: Take 5 mg by mouth every evening.)   budesonide  (PULMICORT ) 0.25 MG/2ML nebulizer solution One vial twice daily (Patient taking differently: Take 0.25 mg  by nebulization in the morning and at bedtime. One vial twice daily)   calcium  carbonate (OSCAL) 1500 (600 Ca) MG TABS tablet Take 600 mg of elemental calcium  by mouth daily with breakfast.   Cholecalciferol  (VITAMIN D3) 50 MCG (2000 UT) TABS Take 2,000 Units by mouth in the morning.   fluticasone  (FLONASE ) 50 MCG/ACT nasal spray SHAKE LIQUID AND USE 2 SPRAYS IN EACH NOSTRIL DAILY (Patient taking differently: Place 2 sprays into both nostrils daily as needed for allergies.)   folic acid  (FOLVITE ) 800 MCG tablet Take 800 mcg by mouth in the morning.   gabapentin  (NEURONTIN ) 100 MG capsule TAKE 1 CAPSULE BY MOUTH AT  BEDTIME   guaiFENesin -dextromethorphan  (ROBITUSSIN DM) 100-10 MG/5ML syrup Take 10 mLs by mouth every 8 (eight) hours. (Patient taking differently: Take 10 mLs by mouth every 8 (eight) hours. Taking it twice a day currently)   levothyroxine  (SYNTHROID ) 75 MCG tablet Take 1 tablet (75 mcg total) by mouth daily.   methotrexate (RHEUMATREX) 2.5 MG tablet 6 tabs Orally ONCE A WEEK for 30 days   montelukast  (SINGULAIR ) 10 MG tablet TAKE 1 TABLET BY MOUTH AT  BEDTIME   Multiple Vitamins-Iron  (MULTIVITAMINS WITH IRON ) TABS tablet Take 1 tablet by mouth in the morning.   olmesartan  (BENICAR ) 20 MG tablet TAKE 1 TABLET(20 MG) BY MOUTH DAILY (Patient taking differently: Take 20 mg by mouth every evening.)   omeprazole  (PRILOSEC) 40 MG capsule TAKE 1 CAPSULE BY MOUTH ONCE  DAILY BEFORE BREAKFAST   UNABLE TO FIND Nebulizer machine with tubing and supplies.   vitamin B-12 (CYANOCOBALAMIN ) 500 MCG tablet Take 500 mcg by mouth in the morning.   Facility-Administered Encounter Medications as of 11/19/2024  Medication   bupivacaine -meloxicam  ER (ZYNRELEF ) injection 400 mg    History: Past Medical History:  Diagnosis Date   Acute pain of right knee 01/30/2011   Qualifier: Diagnosis of  By: Antonetta MD, Ryenn     Anemia    Arthritis    Carpal tunnel syndrome, bilateral    COPD (chronic  obstructive pulmonary disease) (HCC)    Diverticulosis dx 2011   GERD (gastroesophageal reflux disease)    Headache    Hypertension 06/02/2018   Hypothyroidism    Low back pain    Obesity    Rheumatoid arthritis(714.0)    Past Surgical History:  Procedure Laterality Date   bilateral foot surgery     COLONOSCOPY  2011   Dr. Harvey: 3mm sessile polyp (tubular adenoma), pancolonic diverticulosis   COLONOSCOPY N/A 09/26/2017   diverticulosis, redundant colon   COLONOSCOPY WITH PROPOFOL  N/A 05/01/2022   Procedure: COLONOSCOPY WITH PROPOFOL ;  Surgeon: Cindie Carlin POUR, DO;  [facility];  Service: Endoscopy;  Laterality: N/A;  11:30am   ESOPHAGOGASTRODUODENOSCOPY N/A 09/16/2017   Dr. Harvey: multiple gastric polyps, (fundic gland), mild gastritis, path with duodenal mucosa with focal villous blunting and surface erosions, moderate chronic gastritis with intestinal metaplasia and reactive changes. no H.pylori, dysplasia, or malignancy   FLEXIBLE SIGMOIDOSCOPY N/A 09/16/2017   Procedure: FLEXIBLE SIGMOIDOSCOPY;  Surgeon: Harvey Margo CROME, MD;  Location: [facility];  Service: Endoscopy;  Laterality: N/A;   HEMORRHOID SURGERY  1980   KNEE ARTHROSCOPY WITH LATERAL MENISECTOMY Right 10/16/2018   Procedure: KNEE ARTHROSCOPY WITH LATERAL MENISCECTOMY;  Surgeon: Margrette Taft BRAVO, MD;  Location: [facility];  Service: Orthopedics;  Laterality: Right;   OOPHORECTOMY Right    roophorectomy for benign disease  02/2010   Dr. Edsel   tonsillectomy and adenoidctomy in childhood  TOTAL KNEE ARTHROPLASTY Right 05/28/2023   Procedure: TOTAL KNEE ARTHROPLASTY;  Surgeon: Margrette Taft BRAVO, MD;  Location: [facility];  Service: Orthopedics;  Laterality: Right;   TUBAL LIGATION  1977   Family History  Problem Relation Age of Onset   Heart failure Mother    Cancer Mother 57       oral   Heart failure Father    Arthritis Sister    Heart attack Brother    Thyroid  disease Daughter     Hypertension Daughter    Hypertension Daughter    Thyroid  disease Daughter    Heart failure Sister    Diabetes Sister    Hypertension Sister    Leukemia Brother    Colon cancer Neg Hx    Colon polyps Neg Hx    Social History   Occupational History   Occupation: retired    Comment: part time    Occupation: disabled for arthritis     Comment: since age 20    Employer: [employer]  Tobacco Use   Smoking status: Former    Current packs/day: 0.00    Average packs/day: 2.0 packs/day for 25.0 years (50.0 ttl pk-yrs)    Types: Cigarettes    Start date: 05/12/1989    Quit date: 05/12/2014    Years since quitting: 10.5    Passive exposure: Past   Smokeless tobacco: Never  Vaping Use   Vaping status: Never Used  Substance and Sexual Activity   Alcohol use: No   Drug use: No   Sexual activity: Not Currently   Tobacco Counseling Counseling given: Yes  SDOH Screenings   Food Insecurity: No Food Insecurity (11/19/2024)  Housing: Low Risk (11/19/2024)  Transportation Needs: No Transportation Needs (11/19/2024)  Utilities: Not At Risk (11/19/2024)  Alcohol Screen: Low Risk (09/06/2022)  Depression (PHQ2-9): Low Risk (11/19/2024)  Financial Resource Strain: Low Risk (09/06/2022)  Physical Activity: Inactive (11/19/2024)  Social Connections: Moderately Isolated (11/19/2024)  Stress: No Stress Concern Present (11/19/2024)  Tobacco Use: Medium Risk (11/19/2024)  Health Literacy: Adequate Health Literacy (11/19/2024)   See flowsheets for full screening details  Depression Screen PHQ 2 & 9 Depression Scale- Over the past 2 weeks, how often have you been bothered by any of the following problems? Little interest or pleasure in doing things: 0 Feeling down, depressed, or hopeless (PHQ Adolescent also includes...irritable): 0 PHQ-2 Total Score: 0 Trouble falling or staying asleep, or sleeping too much: 0 Feeling tired or having little energy: 0 Poor appetite or overeating (PHQ Adolescent  also includes...weight loss): 0 Feeling bad about yourself - or that you are a failure or have let yourself or your family down: 0 Trouble concentrating on things, such as reading the newspaper or watching television (PHQ Adolescent also includes...like school work): 0 Moving or speaking so slowly that other people could have noticed. Or the opposite - being so fidgety or restless that you have been moving around a lot more than usual: 0 Thoughts that you would be better off dead, or of hurting yourself in some way: 0 PHQ-9 Total Score: 0 If you checked off any problems, how difficult have these problems made it for you to do your work, take care of things at home, or get along with other people?: Not difficult at all  Depression Treatment Depression Interventions/Treatment : EYV7-0 Score <4 Follow-up Not Indicated     Goals Addressed               This Visit's Progress  Remain healthy and not fall (pt-stated)               Objective:    Today's Vitals   11/19/24 0930  Weight: 152 lb (68.9 kg)  Height: 5' 2 (1.575 m)   Body mass index is 27.8 kg/m.  Hearing/Vision screen Hearing Screening - Comments:: Patient denies any hearing difficulties.   Vision Screening - Comments:: Wears rx glasses - up to date with routine eye exams with  Oneil Kawasaki w/ [facility] [place] location Immunizations and Health Maintenance Health Maintenance  Topic Date Due   DTaP/Tdap/Td (1 - Tdap) Never done   Zoster Vaccines- Shingrix (1 of 2) Never done   COVID-19 Vaccine (3 - Moderna risk series) 04/04/2020   Lung Cancer Screening  03/31/2022   Bone Density Scan  08/22/2022   Medicare Annual Wellness (AWV)  09/07/2023   Mammogram  05/07/2024   Colonoscopy  05/02/2027   Pneumococcal Vaccine: 50+ Years  Completed   Influenza Vaccine  Completed   Hepatitis C Screening  Completed   Meningococcal B Vaccine  Aged Out        Assessment/Plan:  This is a routine wellness  examination for [facility].  Patient Care Team: Antonetta Rollene BRAVO, MD as PCP - General Darlean Ozell NOVAK, MD as Consulting Physician (Pulmonary Disease) Eilleen Richerd GRADE, RN as [facility] Management Margrette Taft BRAVO, MD as Consulting Physician (Orthopedic Surgery) Regal, [place] RAMAN, DPM as Consulting Physician (Podiatry) Ishmael Slough, MD as Consulting Physician (Rheumatology) Kennedy Charmaine CROME, NP as Nurse Practitioner (Gastroenterology) Cindie Carlin POUR, DO as Consulting Physician (Gastroenterology) Kawasaki Oneil, DO (Optometry)  I have personally reviewed and noted the following in the patients chart:   Medical and social history Use of alcohol, tobacco or illicit drugs  Current medications and supplements including opioid prescriptions. Functional ability and status Nutritional status Physical activity Advanced directives List of other physicians Hospitalizations, surgeries, and ER visits in previous 12 months Vitals Screenings to include cognitive, depression, and falls Referrals and appointments  Orders Placed This Encounter  Procedures   MM 3D SCREENING MAMMOGRAM BILATERAL BREAST    Standing Status:   Future    Expiration Date:   11/19/2025    Reason for Exam (SYMPTOM  OR DIAGNOSIS REQUIRED):   breast cancer screening    Preferred imaging location?:   [facility]   DG Bone Density    Standing Status:   Future    Expiration Date:   11/19/2025    Reason for Exam (SYMPTOM  OR DIAGNOSIS REQUIRED):   post menopausal estrogen deficient    Preferred imaging location?:   [facility]   Ambulatory Referral for Lung Cancer Scre    Referral Priority:   Routine    Referral Type:   Consultation    Referral Reason:   Specialty Services Required    Number of Visits Requested:   1   In addition, I have reviewed and discussed with patient certain preventive protocols, quality metrics, and best practice recommendations. A written personalized care plan for  preventive services as well as general preventive health recommendations were provided to patient.   Saurabh Hettich, CMA   11/19/2024   No follow-ups on file.  After Visit Summary: (Mail) Due to this being a telephonic visit, the after visit summary with patients personalized plan was offered to patient via mail   HM Addressed: Mammogram ordered DEXA ordered Referral sent for Low Dose Chest CT (smoker/hx smoking)

## 2024-11-19 NOTE — Patient Instructions (Signed)
 Bianca Mcguire,  Thank you for taking the time for your Medicare Wellness Visit. I appreciate your continued commitment to your health goals. Please review the care plan we discussed, and feel free to reach out if I can assist you further.  Please note that Annual Wellness Visits do not include a physical exam. Some assessments may be limited, especially if the visit was conducted virtually. If needed, we may recommend an in-person follow-up with your provider.  Ongoing Care Seeing your primary care provider every 3 to 6 months helps us  monitor your health and provide consistent, personalized care.   Next office visit with primary care provider: December 24, 2024 at 8:40 am   1 year follow up for Medicare well visit: November 22, 2025 at 10:40 am with medicare wellness nurse in office  Referrals If a referral was made during today's visit and you haven't received any updates within two weeks, please contact the referred provider directly to check on the status.  Mammogram at [facility] Call [phone] to schedule your screening No perfumes, lotions, or deodorants the day of your screening. You can schedule your mammogram through mychart!  Osteoporosis Screening An order was placed for you to have your Osteoporosis Screening. Call the number below to schedule that [facility]  [phone]  Lung Cancer Screening-[place] Office [address]-First Floor Medical Building directly across from [facility] Phone [phone]   Recommended Screenings:  Health Maintenance  Topic Date Due   DTaP/Tdap/Td vaccine (1 - Tdap) Never done   Zoster (Shingles) Vaccine (1 of 2) Never done   COVID-19 Vaccine (3 - Moderna risk series) 04/04/2020   Screening for Lung Cancer  03/31/2022   Osteoporosis screening with Bone Density Scan  08/22/2022   Breast Cancer Screening  05/07/2024   Medicare Annual Wellness Visit  11/19/2025   Colon Cancer Screening  05/02/2027   Pneumococcal Vaccine  for age over 76  Completed   Flu Shot  Completed   Hepatitis C Screening  Completed   Meningitis B Vaccine  Aged Out       11/19/2024    9:33 AM  Advanced Directives  Does Patient Have a Medical Advance Directive? No  Would patient like information on creating a medical advance directive? Yes (MAU/Ambulatory/Procedural Areas - Information given)    Vision: Annual vision screenings are recommended for early detection of glaucoma, cataracts, and diabetic retinopathy. These exams can also reveal signs of chronic conditions such as diabetes and high blood pressure.  Dental: Annual dental screenings help detect early signs of oral cancer, gum disease, and other conditions linked to overall health, including heart disease and diabetes.  Please see the attached documents for additional preventive care recommendations.

## 2024-12-07 ENCOUNTER — Ambulatory Visit (HOSPITAL_COMMUNITY)

## 2024-12-07 ENCOUNTER — Other Ambulatory Visit (HOSPITAL_COMMUNITY)

## 2024-12-22 ENCOUNTER — Telehealth: Payer: Self-pay | Admitting: Family Medicine

## 2024-12-22 NOTE — Telephone Encounter (Signed)
 Copied from CRM [id number]. Topic: Referral - Request for Referral >> Dec 22, 2024  3:23 PM Willma R wrote: Did the patient discuss referral with their provider in the last year? Yes  Appointment offered? No  Type of order/referral and detailed reason for visit: Rheumatology, Needs new referral for [facility]  Preference of office, provider, location: [facility] states it needs to be submitted in the [facility] portal  If referral order, have you been seen by this specialty before? Yes, established patient  Can we respond through MyChart? No Kaylee from [facility] can be reached at [phone]

## 2024-12-23 ENCOUNTER — Other Ambulatory Visit: Payer: Self-pay

## 2024-12-23 DIAGNOSIS — M069 Rheumatoid arthritis, unspecified: Secondary | ICD-10-CM

## 2024-12-23 NOTE — Telephone Encounter (Signed)
 Referral ordered

## 2024-12-23 NOTE — Telephone Encounter (Signed)
 Referral PI:[id number] - Referral has been uploaded to [facility] Portal as requested.

## 2024-12-23 NOTE — Telephone Encounter (Unsigned)
 Copied from CRM [id number]. Topic: Referral - Request for Referral >> Dec 23, 2024  9:39 AM Donna BRAVO wrote: Margean with [facility]  stating [facility] has a new process for referrals.   The new process for [facility]: The referring provider logs onto [facility] provider portal  Enter in referral information This is for Insurance purposes only

## 2024-12-24 ENCOUNTER — Encounter: Payer: Self-pay | Admitting: Family Medicine

## 2024-12-24 ENCOUNTER — Ambulatory Visit: Admitting: Family Medicine

## 2024-12-24 ENCOUNTER — Ambulatory Visit (INDEPENDENT_AMBULATORY_CARE_PROVIDER_SITE_OTHER): Admitting: Family Medicine

## 2024-12-24 VITALS — BP 150/78 | HR 82 | Resp 16 | Ht 62.0 in | Wt 160.1 lb

## 2024-12-24 DIAGNOSIS — Z0001 Encounter for general adult medical examination with abnormal findings: Secondary | ICD-10-CM | POA: Diagnosis not present

## 2024-12-24 DIAGNOSIS — E559 Vitamin D deficiency, unspecified: Secondary | ICD-10-CM

## 2024-12-24 DIAGNOSIS — E785 Hyperlipidemia, unspecified: Secondary | ICD-10-CM

## 2024-12-24 DIAGNOSIS — E538 Deficiency of other specified B group vitamins: Secondary | ICD-10-CM

## 2024-12-24 DIAGNOSIS — I10 Essential (primary) hypertension: Secondary | ICD-10-CM | POA: Diagnosis not present

## 2024-12-24 DIAGNOSIS — E039 Hypothyroidism, unspecified: Secondary | ICD-10-CM

## 2024-12-24 DIAGNOSIS — Z87891 Personal history of nicotine dependence: Secondary | ICD-10-CM

## 2024-12-24 MED ORDER — AMLODIPINE BESYLATE 2.5 MG PO TABS: 2.5000 mg | ORAL_TABLET | Freq: Every day | ORAL | 5 refills | Status: AC

## 2024-12-24 MED ORDER — ALENDRONATE SODIUM 70 MG PO TABS: ORAL_TABLET | ORAL | 3 refills | Status: AC

## 2024-12-24 NOTE — Assessment & Plan Note (Signed)
 Refer for lung cancer screen she agrees still nicotine free

## 2024-12-24 NOTE — Patient Instructions (Addendum)
 F/U in 4 months  Nurse BP check Feb 20  Please schedule mammogram and dexa at checkout ,   Labs today, B12, cBC, tSH , lipid, cmp and EGFR , Vit D  You are referred for lung cancer screening   New higher dose of amlodipine  take 5 mg one daily and new extra tablet 2.5 mg one daily Continue olmesartan  20 mg one daily  Thanks for choosing [facility], we consider it a privelige to serve you.

## 2024-12-24 NOTE — Assessment & Plan Note (Signed)
 Annual exam as documented. . Immunization and cancer screening needs are specifically addressed at this visit.

## 2024-12-24 NOTE — Progress Notes (Signed)
" ° ° °  Bianca Mcguire     MRN: [id number]      DOB: 11/02/1946  Chief Complaint  Patient presents with   Annual Exam    HPI: Patient is in for annual physical exam. Immunization is reviewed , and  updated if needed.   PE: BP (!) 150/78   Pulse 82   Resp 16   Ht 5' 2 (1.575 m)   Wt 160 lb 1.9 oz (72.6 kg)   SpO2 93%   BMI 29.29 kg/m   Pleasant  female, alert and oriented x 3, in no cardio-pulmonary distress. Afebrile. HEENT No facial trauma or asymetry. Sinuses non tender.  Extra occullar muscles intact.. External ears normal, . Neck: decreased ROM, no adenopathy,JVD or thyromegaly.No bruits.  Chest: Decreased air entry, scattered Non tender to palpation  Cardiovascular system; Heart sounds normal,  S1 and  S2 ,no S3.  No murmur, or thrill. Apical beat not displaced Peripheral pulses normal.  Abdomen: Soft, non tender, no organomegaly or masses. No bruits. Bowel sounds normal. No guarding, tenderness or rebound.    Musculoskeletal exam: Decreased  ROM of spine, hips , shoulders and knees. Deformity ,swelling and  crepitus noted. No muscle wasting or atrophy.   Neurologic: Cranial nerves 2 to 12 intact. Power, tone ,sensation  normal throughout. Mild  disturbance in gait. No tremor.  Skin: Intact, no ulceration, erythema , scaling or rash noted. Pigmentation normal throughout  Psych; Normal mood and affect. Judgement and concentration normal   Assessment & Plan:  Annual visit for general adult medical examination with abnormal findings Annual exam as documented.  Immunization and cancer screening needs are specifically addressed at this visit.   H/O nicotine dependence Refer for lung cancer screen she agrees still nicotine free  Essential hypertension Uncontrolled additional 2.5 mg amlodipine  daily, continue 5 mg tablet DASH diet and commitment to daily physical activity for a minimum of 30 minutes discussed and encouraged, as a part of  hypertension management. The importance of attaining a healthy weight is also discussed.     12/24/2024    9:21 AM 12/24/2024    8:41 AM 11/19/2024    9:30 AM 10/07/2024   11:24 AM 10/07/2024   11:00 AM 09/24/2024    1:55 PM 09/23/2024   11:56 AM  BP/Weight  Systolic BP 150 189 -- 122 149 132 132  Diastolic BP 78 77 -- 60 75 52 52  Wt. (Lbs)  160.12 152  148    BMI  29.29 kg/m2 27.8 kg/m2  27.07 kg/m2      ' Nurse BP check in 3 weeks  "

## 2024-12-24 NOTE — Assessment & Plan Note (Signed)
 Uncontrolled additional 2.5 mg amlodipine  daily, continue 5 mg tablet DASH diet and commitment to daily physical activity for a minimum of 30 minutes discussed and encouraged, as a part of hypertension management. The importance of attaining a healthy weight is also discussed.     12/24/2024    9:21 AM 12/24/2024    8:41 AM 11/19/2024    9:30 AM 10/07/2024   11:24 AM 10/07/2024   11:00 AM 09/24/2024    1:55 PM 09/23/2024   11:56 AM  BP/Weight  Systolic BP 150 189 -- 122 149 132 132  Diastolic BP 78 77 -- 60 75 52 52  Wt. (Lbs)  160.12 152  148    BMI  29.29 kg/m2 27.8 kg/m2  27.07 kg/m2      ' Nurse BP check in 3 weeks

## 2024-12-25 LAB — CBC WITH DIFFERENTIAL/PLATELET
Basophils Absolute: 0 x10E3/uL (ref 0.0–0.2)
Basos: 1 %
EOS (ABSOLUTE): 0.1 x10E3/uL (ref 0.0–0.4)
Eos: 1 %
Hematocrit: 37.8 % (ref 34.0–46.6)
Hemoglobin: 12.2 g/dL (ref 11.1–15.9)
Immature Grans (Abs): 0.1 x10E3/uL (ref 0.0–0.1)
Immature Granulocytes: 1 %
Lymphocytes Absolute: 2 x10E3/uL (ref 0.7–3.1)
Lymphs: 33 %
MCH: 29.9 pg (ref 26.6–33.0)
MCHC: 32.3 g/dL (ref 31.5–35.7)
MCV: 93 fL (ref 79–97)
Monocytes Absolute: 0.4 x10E3/uL (ref 0.1–0.9)
Monocytes: 7 %
Neutrophils Absolute: 3.5 x10E3/uL (ref 1.4–7.0)
Neutrophils: 57 %
Platelets: 213 x10E3/uL (ref 150–450)
RBC: 4.08 x10E6/uL (ref 3.77–5.28)
RDW: 15.9 % — ABNORMAL HIGH (ref 11.7–15.4)
WBC: 6.1 x10E3/uL (ref 3.4–10.8)

## 2024-12-25 LAB — CMP14+EGFR
ALT: 11 IU/L (ref 0–32)
AST: 19 IU/L (ref 0–40)
Albumin: 4.3 g/dL (ref 3.8–4.8)
Alkaline Phosphatase: 103 IU/L (ref 49–135)
BUN/Creatinine Ratio: 30 — ABNORMAL HIGH (ref 12–28)
BUN: 19 mg/dL (ref 8–27)
Bilirubin Total: 0.3 mg/dL (ref 0.0–1.2)
CO2: 25 mmol/L (ref 20–29)
Calcium: 9.4 mg/dL (ref 8.7–10.3)
Chloride: 107 mmol/L — ABNORMAL HIGH (ref 96–106)
Creatinine, Ser: 0.64 mg/dL (ref 0.57–1.00)
Globulin, Total: 2.4 g/dL (ref 1.5–4.5)
Glucose: 93 mg/dL (ref 70–99)
Potassium: 4.3 mmol/L (ref 3.5–5.2)
Sodium: 143 mmol/L (ref 134–144)
Total Protein: 6.7 g/dL (ref 6.0–8.5)
eGFR: 90 mL/min/1.73

## 2024-12-25 LAB — LIPID PANEL
Chol/HDL Ratio: 2.4 ratio (ref 0.0–4.4)
Cholesterol, Total: 205 mg/dL — ABNORMAL HIGH (ref 100–199)
HDL: 85 mg/dL
LDL Chol Calc (NIH): 107 mg/dL — ABNORMAL HIGH (ref 0–99)
Triglycerides: 71 mg/dL (ref 0–149)
VLDL Cholesterol Cal: 13 mg/dL (ref 5–40)

## 2024-12-25 LAB — VITAMIN B12: Vitamin B-12: 1290 pg/mL — ABNORMAL HIGH (ref 232–1245)

## 2024-12-25 LAB — VITAMIN D 25 HYDROXY (VIT D DEFICIENCY, FRACTURES): Vit D, 25-Hydroxy: 65.2 ng/mL (ref 30.0–100.0)

## 2024-12-25 LAB — TSH: TSH: 1.79 u[IU]/mL (ref 0.450–4.500)

## 2024-12-28 ENCOUNTER — Ambulatory Visit: Payer: Self-pay | Admitting: Family Medicine

## 2024-12-31 ENCOUNTER — Inpatient Hospital Stay (HOSPITAL_COMMUNITY): Admission: RE | Admit: 2024-12-31 | Source: Ambulatory Visit

## 2024-12-31 ENCOUNTER — Ambulatory Visit (HOSPITAL_COMMUNITY)

## 2025-01-29 ENCOUNTER — Ambulatory Visit: Payer: Self-pay

## 2025-04-23 ENCOUNTER — Ambulatory Visit: Payer: Self-pay | Admitting: Family Medicine

## 2025-11-22 ENCOUNTER — Ambulatory Visit: Payer: Self-pay
